# Patient Record
Sex: Female | Born: 1945 | Race: White | Hispanic: No | Marital: Married | Smoking: Never smoker
Health system: Southern US, Community
[De-identification: ages and names within clinical notes are randomized; demographics above are authoritative.]

## PROBLEM LIST (undated history)

## (undated) DIAGNOSIS — Z923 Personal history of irradiation: Secondary | ICD-10-CM

## (undated) DIAGNOSIS — T7840XA Allergy, unspecified, initial encounter: Secondary | ICD-10-CM

## (undated) DIAGNOSIS — N3010 Interstitial cystitis (chronic) without hematuria: Secondary | ICD-10-CM

## (undated) DIAGNOSIS — E039 Hypothyroidism, unspecified: Secondary | ICD-10-CM

## (undated) DIAGNOSIS — M810 Age-related osteoporosis without current pathological fracture: Secondary | ICD-10-CM

## (undated) DIAGNOSIS — M1990 Unspecified osteoarthritis, unspecified site: Secondary | ICD-10-CM

## (undated) DIAGNOSIS — C50919 Malignant neoplasm of unspecified site of unspecified female breast: Secondary | ICD-10-CM

## (undated) DIAGNOSIS — Z860100 Personal history of colon polyps, unspecified: Secondary | ICD-10-CM

## (undated) DIAGNOSIS — F329 Major depressive disorder, single episode, unspecified: Secondary | ICD-10-CM

## (undated) DIAGNOSIS — M8580 Other specified disorders of bone density and structure, unspecified site: Secondary | ICD-10-CM

## (undated) DIAGNOSIS — F419 Anxiety disorder, unspecified: Secondary | ICD-10-CM

## (undated) DIAGNOSIS — C4490 Unspecified malignant neoplasm of skin, unspecified: Secondary | ICD-10-CM

## (undated) DIAGNOSIS — H269 Unspecified cataract: Secondary | ICD-10-CM

## (undated) DIAGNOSIS — E559 Vitamin D deficiency, unspecified: Secondary | ICD-10-CM

## (undated) DIAGNOSIS — E785 Hyperlipidemia, unspecified: Secondary | ICD-10-CM

## (undated) DIAGNOSIS — F32A Depression, unspecified: Secondary | ICD-10-CM

## (undated) HISTORY — DX: Age-related osteoporosis without current pathological fracture: M81.0

## (undated) HISTORY — DX: Unspecified malignant neoplasm of skin, unspecified: C44.90

## (undated) HISTORY — DX: Unspecified osteoarthritis, unspecified site: M19.90

## (undated) HISTORY — PX: EYE SURGERY: SHX253

## (undated) HISTORY — DX: Personal history of colonic polyps: Z86.010

## (undated) HISTORY — DX: Vitamin D deficiency, unspecified: E55.9

## (undated) HISTORY — DX: Personal history of colon polyps, unspecified: Z86.0100

## (undated) HISTORY — DX: Allergy, unspecified, initial encounter: T78.40XA

## (undated) HISTORY — DX: Interstitial cystitis (chronic) without hematuria: N30.10

## (undated) HISTORY — DX: Other specified disorders of bone density and structure, unspecified site: M85.80

## (undated) HISTORY — DX: Unspecified cataract: H26.9

## (undated) HISTORY — PX: TONSILLECTOMY: SUR1361

## (undated) HISTORY — DX: Hyperlipidemia, unspecified: E78.5

---
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
## 1999-05-22 ENCOUNTER — Encounter: Payer: Self-pay | Admitting: Obstetrics and Gynecology

## 1999-05-22 ENCOUNTER — Encounter: Admission: RE | Admit: 1999-05-22 | Discharge: 1999-05-22 | Payer: Self-pay | Admitting: Obstetrics and Gynecology

## 1999-05-27 ENCOUNTER — Encounter: Payer: Self-pay | Admitting: Obstetrics and Gynecology

## 1999-05-27 ENCOUNTER — Encounter: Admission: RE | Admit: 1999-05-27 | Discharge: 1999-05-27 | Payer: Self-pay | Admitting: Obstetrics and Gynecology

## 1999-11-17 ENCOUNTER — Encounter: Payer: Self-pay | Admitting: Obstetrics and Gynecology

## 1999-11-17 ENCOUNTER — Encounter: Admission: RE | Admit: 1999-11-17 | Discharge: 1999-11-17 | Payer: Self-pay | Admitting: Obstetrics and Gynecology

## 2000-02-17 ENCOUNTER — Encounter: Admission: RE | Admit: 2000-02-17 | Discharge: 2000-02-17 | Payer: Self-pay | Admitting: Obstetrics and Gynecology

## 2000-02-17 ENCOUNTER — Encounter: Payer: Self-pay | Admitting: Obstetrics and Gynecology

## 2000-06-03 ENCOUNTER — Encounter: Admission: RE | Admit: 2000-06-03 | Discharge: 2000-06-03 | Payer: Self-pay | Admitting: Obstetrics and Gynecology

## 2000-06-03 ENCOUNTER — Encounter: Payer: Self-pay | Admitting: Obstetrics and Gynecology

## 2001-06-21 ENCOUNTER — Encounter: Payer: Self-pay | Admitting: Family Medicine

## 2001-06-21 ENCOUNTER — Encounter: Admission: RE | Admit: 2001-06-21 | Discharge: 2001-06-21 | Payer: Self-pay | Admitting: Family Medicine

## 2001-09-14 LAB — HM COLONOSCOPY

## 2001-10-11 ENCOUNTER — Ambulatory Visit (HOSPITAL_COMMUNITY): Admission: RE | Admit: 2001-10-11 | Discharge: 2001-10-11 | Payer: Self-pay | Admitting: Gastroenterology

## 2001-10-25 ENCOUNTER — Other Ambulatory Visit: Admission: RE | Admit: 2001-10-25 | Discharge: 2001-10-25 | Payer: Self-pay | Admitting: Radiology

## 2001-10-25 ENCOUNTER — Encounter: Admission: RE | Admit: 2001-10-25 | Discharge: 2001-10-25 | Payer: Self-pay | Admitting: Family Medicine

## 2001-10-25 ENCOUNTER — Encounter (INDEPENDENT_AMBULATORY_CARE_PROVIDER_SITE_OTHER): Payer: Self-pay | Admitting: *Deleted

## 2001-10-25 ENCOUNTER — Encounter: Payer: Self-pay | Admitting: Family Medicine

## 2001-11-29 ENCOUNTER — Other Ambulatory Visit: Admission: RE | Admit: 2001-11-29 | Discharge: 2001-11-29 | Payer: Self-pay | Admitting: Family Medicine

## 2002-08-01 ENCOUNTER — Encounter: Payer: Self-pay | Admitting: Family Medicine

## 2002-08-01 ENCOUNTER — Encounter: Admission: RE | Admit: 2002-08-01 | Discharge: 2002-08-01 | Payer: Self-pay | Admitting: Family Medicine

## 2002-11-23 DIAGNOSIS — D229 Melanocytic nevi, unspecified: Secondary | ICD-10-CM

## 2002-11-23 HISTORY — DX: Melanocytic nevi, unspecified: D22.9

## 2002-12-07 ENCOUNTER — Other Ambulatory Visit: Admission: RE | Admit: 2002-12-07 | Discharge: 2002-12-07 | Payer: Self-pay | Admitting: Family Medicine

## 2003-08-21 ENCOUNTER — Encounter: Admission: RE | Admit: 2003-08-21 | Discharge: 2003-08-21 | Payer: Self-pay | Admitting: Family Medicine

## 2004-01-03 ENCOUNTER — Other Ambulatory Visit: Admission: RE | Admit: 2004-01-03 | Discharge: 2004-01-03 | Payer: Self-pay | Admitting: Family Medicine

## 2004-09-08 ENCOUNTER — Encounter: Admission: RE | Admit: 2004-09-08 | Discharge: 2004-09-08 | Payer: Self-pay | Admitting: Family Medicine

## 2005-01-14 ENCOUNTER — Other Ambulatory Visit: Admission: RE | Admit: 2005-01-14 | Discharge: 2005-01-14 | Payer: Self-pay | Admitting: Family Medicine

## 2005-09-14 ENCOUNTER — Encounter: Admission: RE | Admit: 2005-09-14 | Discharge: 2005-09-14 | Payer: Self-pay | Admitting: Family Medicine

## 2006-01-20 ENCOUNTER — Other Ambulatory Visit: Admission: RE | Admit: 2006-01-20 | Discharge: 2006-01-20 | Payer: Self-pay | Admitting: Family Medicine

## 2006-05-04 ENCOUNTER — Ambulatory Visit (HOSPITAL_COMMUNITY): Admission: RE | Admit: 2006-05-04 | Discharge: 2006-05-04 | Payer: Self-pay | Admitting: Family Medicine

## 2006-09-29 ENCOUNTER — Encounter: Admission: RE | Admit: 2006-09-29 | Discharge: 2006-09-29 | Payer: Self-pay | Admitting: Family Medicine

## 2007-03-18 LAB — HM DEXA SCAN

## 2007-10-05 ENCOUNTER — Encounter: Admission: RE | Admit: 2007-10-05 | Discharge: 2007-10-05 | Payer: Self-pay | Admitting: Family Medicine

## 2008-05-20 ENCOUNTER — Encounter: Admission: RE | Admit: 2008-05-20 | Discharge: 2008-05-20 | Payer: Self-pay | Admitting: Family Medicine

## 2008-07-24 ENCOUNTER — Encounter: Admission: RE | Admit: 2008-07-24 | Discharge: 2008-07-24 | Payer: Self-pay | Admitting: Family Medicine

## 2008-10-09 ENCOUNTER — Encounter: Admission: RE | Admit: 2008-10-09 | Discharge: 2008-10-09 | Payer: Self-pay | Admitting: Family Medicine

## 2009-11-05 ENCOUNTER — Encounter: Admission: RE | Admit: 2009-11-05 | Discharge: 2009-11-05 | Payer: Self-pay | Admitting: Obstetrics and Gynecology

## 2009-11-07 ENCOUNTER — Encounter: Admission: RE | Admit: 2009-11-07 | Discharge: 2009-11-07 | Payer: Self-pay | Admitting: Obstetrics and Gynecology

## 2009-11-07 LAB — HM MAMMOGRAPHY

## 2010-03-24 LAB — FECAL OCCULT BLOOD, GUAIAC: Fecal Occult Blood: NEGATIVE

## 2010-06-29 LAB — HM PAP SMEAR

## 2010-09-02 ENCOUNTER — Encounter: Payer: Self-pay | Admitting: Family Medicine

## 2010-09-02 DIAGNOSIS — M810 Age-related osteoporosis without current pathological fracture: Secondary | ICD-10-CM | POA: Insufficient documentation

## 2010-10-02 NOTE — Procedures (Signed)
[facility]  Patient:    Kayla Randolph, Kayla Randolph Visit Number: [id number] MRN: [id number]          Service Type: END Location: ENDO Attending Physician:  Louie Bun Dictated by:   Everardo All Madilyn Fireman, M.D. Proc. Date: 10/11/01 Admit Date:  10/11/2001   CC:         Gweneth Dimitri, M.D.   Procedure Report  PROCEDURE:  Colonoscopy.  INDICATION FOR PROCEDURE:  Screening colonoscopy in a 65 year old patient with no previous colon screening.  DESCRIPTION OF PROCEDURE:  The patient was placed in the left lateral decubitus position then placed on the pulse monitor with continuous low flow oxygen delivered by nasal cannula. She was sedated with 80 mg IV Demerol and 8 mg IV Versed. The Olympus video colonoscope was inserted into the rectum and advanced to the cecum, confirmed by transillumination at McBurneys point and visualization of the ileocecal valve and appendiceal orifice. The prep was excellent. The cecum, ascending, transverse, descending and sigmoid colon all appeared normal with no masses, polyps, diverticula or other mucosal abnormalities. The rectum likewise appeared normal and retroflexed view of the anus revealed some small internal hemorrhoids. The colonoscope was then withdrawn and the patient returned to the recovery room in stable condition. The patient tolerated the procedure well and there were no immediate complications.  IMPRESSION:  Internal hemorrhoids otherwise normal colonoscopy.  PLAN:  Flexible sigmoidoscopy in five years. Colonoscopy in 10 years. Dictated by:   Everardo All Madilyn Fireman, M.D. Attending Physician:  Louie Bun DD:  10/11/01 TD:  10/12/01 Job: [phone]

## 2010-12-01 ENCOUNTER — Other Ambulatory Visit: Payer: Self-pay | Admitting: Family Medicine

## 2010-12-01 DIAGNOSIS — Z1231 Encounter for screening mammogram for malignant neoplasm of breast: Secondary | ICD-10-CM

## 2010-12-08 ENCOUNTER — Ambulatory Visit
Admission: RE | Admit: 2010-12-08 | Discharge: 2010-12-08 | Disposition: A | Discharge status: Home / Self Care | Payer: Medicare Other | Source: Ambulatory Visit | Attending: Family Medicine | Admitting: Family Medicine

## 2010-12-08 DIAGNOSIS — Z1231 Encounter for screening mammogram for malignant neoplasm of breast: Secondary | ICD-10-CM

## 2011-07-15 DIAGNOSIS — M542 Cervicalgia: Secondary | ICD-10-CM | POA: Diagnosis not present

## 2011-07-15 DIAGNOSIS — R109 Unspecified abdominal pain: Secondary | ICD-10-CM | POA: Diagnosis not present

## 2011-09-29 DIAGNOSIS — Z124 Encounter for screening for malignant neoplasm of cervix: Secondary | ICD-10-CM | POA: Diagnosis not present

## 2011-09-29 DIAGNOSIS — Z01419 Encounter for gynecological examination (general) (routine) without abnormal findings: Secondary | ICD-10-CM | POA: Diagnosis not present

## 2011-10-12 DIAGNOSIS — R5381 Other malaise: Secondary | ICD-10-CM | POA: Diagnosis not present

## 2011-10-12 DIAGNOSIS — E559 Vitamin D deficiency, unspecified: Secondary | ICD-10-CM | POA: Diagnosis not present

## 2011-10-12 DIAGNOSIS — E785 Hyperlipidemia, unspecified: Secondary | ICD-10-CM | POA: Diagnosis not present

## 2011-10-12 DIAGNOSIS — R5383 Other fatigue: Secondary | ICD-10-CM | POA: Diagnosis not present

## 2011-11-17 DIAGNOSIS — E785 Hyperlipidemia, unspecified: Secondary | ICD-10-CM | POA: Diagnosis not present

## 2011-11-30 DIAGNOSIS — D128 Benign neoplasm of rectum: Secondary | ICD-10-CM | POA: Diagnosis not present

## 2011-11-30 DIAGNOSIS — D129 Benign neoplasm of anus and anal canal: Secondary | ICD-10-CM | POA: Diagnosis not present

## 2011-11-30 DIAGNOSIS — D126 Benign neoplasm of colon, unspecified: Secondary | ICD-10-CM | POA: Diagnosis not present

## 2011-11-30 DIAGNOSIS — Z1211 Encounter for screening for malignant neoplasm of colon: Secondary | ICD-10-CM | POA: Diagnosis not present

## 2011-12-09 DIAGNOSIS — M899 Disorder of bone, unspecified: Secondary | ICD-10-CM | POA: Diagnosis not present

## 2011-12-09 DIAGNOSIS — E785 Hyperlipidemia, unspecified: Secondary | ICD-10-CM | POA: Diagnosis not present

## 2011-12-09 DIAGNOSIS — R072 Precordial pain: Secondary | ICD-10-CM | POA: Diagnosis not present

## 2011-12-09 DIAGNOSIS — R5381 Other malaise: Secondary | ICD-10-CM | POA: Diagnosis not present

## 2011-12-09 DIAGNOSIS — M949 Disorder of cartilage, unspecified: Secondary | ICD-10-CM | POA: Diagnosis not present

## 2011-12-09 DIAGNOSIS — R5383 Other fatigue: Secondary | ICD-10-CM | POA: Diagnosis not present

## 2011-12-15 ENCOUNTER — Other Ambulatory Visit: Payer: Self-pay | Admitting: Family Medicine

## 2011-12-15 DIAGNOSIS — Z1231 Encounter for screening mammogram for malignant neoplasm of breast: Secondary | ICD-10-CM

## 2011-12-28 ENCOUNTER — Ambulatory Visit
Admission: RE | Admit: 2011-12-28 | Discharge: 2011-12-28 | Disposition: A | Discharge status: Home / Self Care | Payer: Medicare Other | Source: Ambulatory Visit | Attending: Family Medicine | Admitting: Family Medicine

## 2011-12-28 DIAGNOSIS — Z1231 Encounter for screening mammogram for malignant neoplasm of breast: Secondary | ICD-10-CM

## 2012-02-03 DIAGNOSIS — H04129 Dry eye syndrome of unspecified lacrimal gland: Secondary | ICD-10-CM | POA: Diagnosis not present

## 2012-02-03 DIAGNOSIS — H2510 Age-related nuclear cataract, unspecified eye: Secondary | ICD-10-CM | POA: Diagnosis not present

## 2012-03-09 DIAGNOSIS — Z23 Encounter for immunization: Secondary | ICD-10-CM | POA: Diagnosis not present

## 2012-03-15 DIAGNOSIS — M899 Disorder of bone, unspecified: Secondary | ICD-10-CM | POA: Diagnosis not present

## 2012-06-01 DIAGNOSIS — K589 Irritable bowel syndrome without diarrhea: Secondary | ICD-10-CM | POA: Diagnosis not present

## 2012-06-01 DIAGNOSIS — M949 Disorder of cartilage, unspecified: Secondary | ICD-10-CM | POA: Diagnosis not present

## 2012-06-01 DIAGNOSIS — E559 Vitamin D deficiency, unspecified: Secondary | ICD-10-CM | POA: Diagnosis not present

## 2012-06-01 DIAGNOSIS — E785 Hyperlipidemia, unspecified: Secondary | ICD-10-CM | POA: Diagnosis not present

## 2012-06-01 DIAGNOSIS — M899 Disorder of bone, unspecified: Secondary | ICD-10-CM | POA: Diagnosis not present

## 2012-06-01 DIAGNOSIS — I10 Essential (primary) hypertension: Secondary | ICD-10-CM | POA: Diagnosis not present

## 2012-06-02 DIAGNOSIS — Z1212 Encounter for screening for malignant neoplasm of rectum: Secondary | ICD-10-CM | POA: Diagnosis not present

## 2012-06-22 DIAGNOSIS — D510 Vitamin B12 deficiency anemia due to intrinsic factor deficiency: Secondary | ICD-10-CM | POA: Diagnosis not present

## 2012-06-22 DIAGNOSIS — R5381 Other malaise: Secondary | ICD-10-CM | POA: Diagnosis not present

## 2012-06-22 DIAGNOSIS — Z79899 Other long term (current) drug therapy: Secondary | ICD-10-CM | POA: Diagnosis not present

## 2012-06-22 DIAGNOSIS — R5383 Other fatigue: Secondary | ICD-10-CM | POA: Diagnosis not present

## 2012-07-04 DIAGNOSIS — L821 Other seborrheic keratosis: Secondary | ICD-10-CM | POA: Diagnosis not present

## 2012-07-04 DIAGNOSIS — D239 Other benign neoplasm of skin, unspecified: Secondary | ICD-10-CM | POA: Diagnosis not present

## 2012-07-04 DIAGNOSIS — L608 Other nail disorders: Secondary | ICD-10-CM | POA: Diagnosis not present

## 2012-09-18 ENCOUNTER — Telehealth: Payer: Self-pay | Admitting: Family Medicine

## 2012-09-18 DIAGNOSIS — R790 Abnormal level of blood mineral: Secondary | ICD-10-CM

## 2012-09-19 ENCOUNTER — Other Ambulatory Visit (INDEPENDENT_AMBULATORY_CARE_PROVIDER_SITE_OTHER): Payer: Medicare Other

## 2012-09-19 DIAGNOSIS — R7989 Other specified abnormal findings of blood chemistry: Secondary | ICD-10-CM

## 2012-09-19 LAB — POCT CBC
Granulocyte percent: 67 %G (ref 37–80)
HCT, POC: 47.6 % (ref 37.7–47.9)
Hemoglobin: 15.1 g/dL (ref 12.2–16.2)
Lymph, poc: 1.4 (ref 0.6–3.4)
MCH, POC: 28.7 pg (ref 27–31.2)
MCHC: 31.7 g/dL — AB (ref 31.8–35.4)
MCV: 90.4 fL (ref 80–97)
MPV: 8.1 fL (ref 0–99.8)
POC Granulocyte: 3.3 (ref 2–6.9)
POC LYMPH PERCENT: 27.6 %L (ref 10–50)
Platelet Count, POC: 177 10*3/uL (ref 142–424)
RBC: 5.3 M/uL (ref 4.04–5.48)
RDW, POC: 14.6 %
WBC: 5 10*3/uL (ref 4.6–10.2)

## 2012-09-19 LAB — FERRITIN: Ferritin: 23 ng/mL (ref 10–291)

## 2012-09-19 NOTE — Progress Notes (Unsigned)
Patient came in for labs only.

## 2012-09-19 NOTE — Telephone Encounter (Signed)
Patient had a ferritin level of 12 in 06/2012 and was advised to repeat the CBC and ferritin in 3 months.   Appt scheduled for labwork today and orders placed.

## 2012-11-14 ENCOUNTER — Telehealth: Payer: Self-pay | Admitting: Family Medicine

## 2012-11-14 DIAGNOSIS — E559 Vitamin D deficiency, unspecified: Secondary | ICD-10-CM

## 2012-11-14 DIAGNOSIS — D649 Anemia, unspecified: Secondary | ICD-10-CM

## 2012-11-14 DIAGNOSIS — E785 Hyperlipidemia, unspecified: Secondary | ICD-10-CM

## 2012-11-14 DIAGNOSIS — R5383 Other fatigue: Secondary | ICD-10-CM

## 2012-11-14 NOTE — Telephone Encounter (Signed)
Pt notified lab order entered.  

## 2012-12-07 ENCOUNTER — Ambulatory Visit: Payer: Self-pay | Admitting: Family Medicine

## 2012-12-07 ENCOUNTER — Other Ambulatory Visit (INDEPENDENT_AMBULATORY_CARE_PROVIDER_SITE_OTHER): Payer: Medicare Other

## 2012-12-07 DIAGNOSIS — D649 Anemia, unspecified: Secondary | ICD-10-CM | POA: Diagnosis not present

## 2012-12-07 DIAGNOSIS — E785 Hyperlipidemia, unspecified: Secondary | ICD-10-CM

## 2012-12-07 DIAGNOSIS — R5383 Other fatigue: Secondary | ICD-10-CM

## 2012-12-07 DIAGNOSIS — R5381 Other malaise: Secondary | ICD-10-CM

## 2012-12-07 DIAGNOSIS — E559 Vitamin D deficiency, unspecified: Secondary | ICD-10-CM

## 2012-12-07 LAB — POCT CBC
Granulocyte percent: 74.3 %G (ref 37–80)
HCT, POC: 45.4 % (ref 37.7–47.9)
Hemoglobin: 14.7 g/dL (ref 12.2–16.2)
Lymph, poc: 1.1 (ref 0.6–3.4)
MCH, POC: 29.4 pg (ref 27–31.2)
MCHC: 32.4 g/dL (ref 31.8–35.4)
MCV: 90.7 fL (ref 80–97)
MPV: 8.2 fL (ref 0–99.8)
POC Granulocyte: 3.9 (ref 2–6.9)
POC LYMPH PERCENT: 21.3 %L (ref 10–50)
Platelet Count, POC: 154 10*3/uL (ref 142–424)
RBC: 5 M/uL (ref 4.04–5.48)
RDW, POC: 14.3 %
WBC: 5.2 10*3/uL (ref 4.6–10.2)

## 2012-12-08 LAB — THYROID PANEL WITH TSH
Free Thyroxine Index: 2 (ref 1.0–3.9)
T3 Uptake: 37.8 % — ABNORMAL HIGH (ref 22.5–37.0)
T4, Total: 5.3 ug/dL (ref 5.0–12.5)
TSH: 2.299 u[IU]/mL (ref 0.350–4.500)

## 2012-12-08 LAB — BASIC METABOLIC PANEL WITH GFR
BUN: 15 mg/dL (ref 6–23)
CO2: 29 mEq/L (ref 19–32)
Calcium: 9.3 mg/dL (ref 8.4–10.5)
Chloride: 103 mEq/L (ref 96–112)
Creat: 0.8 mg/dL (ref 0.50–1.10)
GFR, Est African American: 88 mL/min
GFR, Est Non African American: 76 mL/min
Glucose, Bld: 72 mg/dL (ref 70–99)
Potassium: 4.4 mEq/L (ref 3.5–5.3)
Sodium: 143 mEq/L (ref 135–145)

## 2012-12-08 LAB — HEPATIC FUNCTION PANEL
ALT: 14 U/L (ref 0–35)
AST: 23 U/L (ref 0–37)
Albumin: 4.2 g/dL (ref 3.5–5.2)
Alkaline Phosphatase: 38 U/L — ABNORMAL LOW (ref 39–117)
Bilirubin, Direct: 0.1 mg/dL (ref 0.0–0.3)
Indirect Bilirubin: 0.5 mg/dL (ref 0.0–0.9)
Total Bilirubin: 0.6 mg/dL (ref 0.3–1.2)
Total Protein: 6.4 g/dL (ref 6.0–8.3)

## 2012-12-08 LAB — VITAMIN D 25 HYDROXY (VIT D DEFICIENCY, FRACTURES): Vit D, 25-Hydroxy: 58 ng/mL (ref 30–89)

## 2012-12-11 LAB — NMR LIPOPROFILE WITH LIPIDS
Cholesterol, Total: 195 mg/dL (ref <200)
HDL Particle Number: 39.5 umol/L (ref >=30.5)
HDL Size: 9.8 nm (ref >=9.2)
HDL-C: 76 mg/dL (ref >=40)
LDL (calc): 102 mg/dL — ABNORMAL HIGH (ref <100)
LDL Particle Number: 1344 nmol/L — ABNORMAL HIGH (ref <1000)
LDL Size: 21.5 nm (ref >20.5)
LP-IR Score: <25 (ref <=45)
Large HDL-P: 15.5 umol/L (ref >=4.8)
Large VLDL-P: <0.8 nmol/L (ref <=2.7)
Small LDL Particle Number: 397 nmol/L (ref <=527)
Triglycerides: 83 mg/dL (ref <150)
VLDL Size: 43.7 nm (ref <=46.6)

## 2012-12-14 ENCOUNTER — Encounter: Payer: Self-pay | Admitting: Family Medicine

## 2012-12-14 ENCOUNTER — Ambulatory Visit (INDEPENDENT_AMBULATORY_CARE_PROVIDER_SITE_OTHER): Payer: Medicare Other | Admitting: Family Medicine

## 2012-12-14 VITALS — BP 107/54 | HR 62 | Temp 97.3°F | Ht 66.0 in | Wt 117.2 lb

## 2012-12-14 DIAGNOSIS — E559 Vitamin D deficiency, unspecified: Secondary | ICD-10-CM | POA: Diagnosis not present

## 2012-12-14 DIAGNOSIS — E785 Hyperlipidemia, unspecified: Secondary | ICD-10-CM

## 2012-12-14 DIAGNOSIS — G5690 Unspecified mononeuropathy of unspecified upper limb: Secondary | ICD-10-CM

## 2012-12-14 DIAGNOSIS — J309 Allergic rhinitis, unspecified: Secondary | ICD-10-CM | POA: Diagnosis not present

## 2012-12-14 DIAGNOSIS — M47812 Spondylosis without myelopathy or radiculopathy, cervical region: Secondary | ICD-10-CM

## 2012-12-14 DIAGNOSIS — M810 Age-related osteoporosis without current pathological fracture: Secondary | ICD-10-CM

## 2012-12-14 NOTE — Patient Instructions (Addendum)
Fall precautions discussed Continue current meds and therapeutic lifestyle changes Pt will schedule mammogram  Using ayr nose spray frequently Drink plenty of fluid

## 2012-12-14 NOTE — Progress Notes (Signed)
  Subjective:    Patient ID: Kayla Randolph, female    DOB: [birth year], 67 y.o.   MRN: [id number]  HPI Patient comes in today for followup of chronic medical problems. These include hyperlipidemia, vitamin D deficiency, osteopenia, allergic rhinitis, and IBS. She also has a history of a cervical neuropathy. Her health maintenance parameters are currently up-to-date. Her mammogram will be due after August 18.   Review of Systems  Constitutional: Positive for fatigue.  HENT: Positive for postnasal drip. Negative for ear pain and sore throat.   Eyes: Positive for pain (burning, dry eyes).  Respiratory: Positive for cough (due to drainage, dry). Negative for choking, chest tightness, shortness of breath and wheezing.   Cardiovascular: Negative.  Negative for chest pain, palpitations and leg swelling.  Gastrointestinal: Positive for abdominal pain and abdominal distention (bloating due to IBS).  Endocrine: Positive for cold intolerance. Negative for heat intolerance, polydipsia, polyphagia and polyuria.  Genitourinary: Negative for dysuria, urgency, frequency, hematuria, vaginal bleeding, vaginal discharge and vaginal pain.  Musculoskeletal: Positive for back pain (LBP) and arthralgias (R elbow occasionally).  Skin: Negative.  Negative for color change, pallor, rash and wound.  Allergic/Immunologic: Positive for environmental allergies (year around).  Neurological: Positive for numbness (postional, R>L arms) and headaches. Negative for dizziness, tremors, weakness and light-headedness.  Hematological: Negative.   Psychiatric/Behavioral: Negative for sleep disturbance. The patient is nervous/anxious (intermitent anxiety).        Objective:   Physical Exam BP 107/54  Pulse 62  Temp(Src) 97.3 F (36.3 C) (Oral)  Ht 5\' 6"  (1.676 m)  Wt 117 lb 3.2 oz (53.162 kg)  BMI 18.93 kg/m2  The patient appeared well nourished and normally developed, alert and oriented to time and place. Speech, behavior  and judgement appear normal. Vital signs as documented.  Head exam is unremarkable. No scleral icterus or pallor noted. She had nasal congestion bilaterally. Her throat was slightly red posteriorly. Ear canals and eardrums were normal. Neck is without jugular venous distension, thyromegally, or carotid bruits. Carotid upstrokes are brisk bilaterally. No cervical adenopathy. Lungs are clear anteriorly and posteriorly to auscultation. Normal respiratory effort. Cardiac exam reveals regular rate and rhythm at 60 per minute.. First and second heart sounds normal.  No murmurs, rubs or gallops. Breasts exam was done today. There were is thickening and the upper outer quadrants of each breast consistent with fibrocystic changes. She is due to get her mammogram in late August or September  Abdominal exam reveals normal bowl sounds, no masses, no organomegaly and no aortic enlargement. No inguinal adenopathy. No specific tenderness. Extremities are nonedematous and both femoral and pedal pulses are normal. Lower extremities were cool to touch. Skin without pallor or jaundice.  Warm and dry, without rash. Neurologic exam reveals normal deep tendon reflexes and normal sensation. Reflexes were checked in both upper and lower extremities.          Assessment & Plan:  1. Hyperlipemia  2. Vitamin D deficiency  3. Allergic rhinitis  4. Cervical spine arthritis  5. Upper extremity neuropathy, unspecified laterality  6. Osteopenia  Patient Instructions  Fall precautions discussed Continue current meds and therapeutic lifestyle changes Pt will schedule mammogram  Using ayr nose spray frequently Drink plenty of fluid     Nyra Capes MD

## 2012-12-18 ENCOUNTER — Other Ambulatory Visit: Payer: Self-pay

## 2012-12-18 DIAGNOSIS — Z1231 Encounter for screening mammogram for malignant neoplasm of breast: Secondary | ICD-10-CM

## 2013-01-02 ENCOUNTER — Ambulatory Visit
Admission: RE | Admit: 2013-01-02 | Discharge: 2013-01-02 | Disposition: A | Discharge status: Home / Self Care | Payer: Medicare Other | Source: Ambulatory Visit

## 2013-01-02 DIAGNOSIS — Z1231 Encounter for screening mammogram for malignant neoplasm of breast: Secondary | ICD-10-CM | POA: Diagnosis not present

## 2013-01-04 ENCOUNTER — Ambulatory Visit (INDEPENDENT_AMBULATORY_CARE_PROVIDER_SITE_OTHER): Payer: Medicare Other | Admitting: Pharmacist

## 2013-01-04 ENCOUNTER — Encounter: Payer: Self-pay | Admitting: Pharmacist

## 2013-01-04 ENCOUNTER — Telehealth: Payer: Self-pay | Admitting: Family Medicine

## 2013-01-04 VITALS — Ht 66.0 in | Wt 118.0 lb

## 2013-01-04 DIAGNOSIS — E785 Hyperlipidemia, unspecified: Secondary | ICD-10-CM | POA: Diagnosis not present

## 2013-01-04 NOTE — Telephone Encounter (Signed)
Husband had questions about gluten free diet.  He wanted to know if he needs to take a supplement.  Suggested daily MVI.
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
## 2013-02-02 ENCOUNTER — Other Ambulatory Visit: Payer: Self-pay | Admitting: Family Medicine

## 2013-03-12 ENCOUNTER — Other Ambulatory Visit: Payer: Self-pay | Admitting: Family Medicine

## 2013-03-22 ENCOUNTER — Other Ambulatory Visit: Payer: Self-pay

## 2013-03-22 DIAGNOSIS — H04129 Dry eye syndrome of unspecified lacrimal gland: Secondary | ICD-10-CM | POA: Diagnosis not present

## 2013-03-22 DIAGNOSIS — H2510 Age-related nuclear cataract, unspecified eye: Secondary | ICD-10-CM | POA: Diagnosis not present

## 2013-04-03 ENCOUNTER — Ambulatory Visit (INDEPENDENT_AMBULATORY_CARE_PROVIDER_SITE_OTHER): Payer: Medicare Other | Admitting: Family Medicine

## 2013-04-03 ENCOUNTER — Encounter: Payer: Self-pay | Admitting: *Deleted

## 2013-04-03 ENCOUNTER — Encounter: Payer: Self-pay | Admitting: Family Medicine

## 2013-04-03 VITALS — BP 129/74 | HR 60 | Temp 97.7°F | Ht 66.0 in | Wt 116.0 lb

## 2013-04-03 DIAGNOSIS — E559 Vitamin D deficiency, unspecified: Secondary | ICD-10-CM

## 2013-04-03 DIAGNOSIS — M47812 Spondylosis without myelopathy or radiculopathy, cervical region: Secondary | ICD-10-CM

## 2013-04-03 DIAGNOSIS — M810 Age-related osteoporosis without current pathological fracture: Secondary | ICD-10-CM | POA: Diagnosis not present

## 2013-04-03 DIAGNOSIS — F32A Depression, unspecified: Secondary | ICD-10-CM | POA: Insufficient documentation

## 2013-04-03 DIAGNOSIS — E785 Hyperlipidemia, unspecified: Secondary | ICD-10-CM

## 2013-04-03 DIAGNOSIS — Z23 Encounter for immunization: Secondary | ICD-10-CM | POA: Diagnosis not present

## 2013-04-03 DIAGNOSIS — F329 Major depressive disorder, single episode, unspecified: Secondary | ICD-10-CM | POA: Diagnosis not present

## 2013-04-03 LAB — POCT CBC
Granulocyte percent: 75.4 %G (ref 37–80)
HCT, POC: 45.2 % (ref 37.7–47.9)
Hemoglobin: 15.1 g/dL (ref 12.2–16.2)
Lymph, poc: 1.1 (ref 0.6–3.4)
MCH, POC: 30.2 pg (ref 27–31.2)
MCHC: 33.3 g/dL (ref 31.8–35.4)
MCV: 90.6 fL (ref 80–97)
MPV: 8.4 fL (ref 0–99.8)
POC Granulocyte: 3.8 (ref 2–6.9)
POC LYMPH PERCENT: 21.5 %L (ref 10–50)
Platelet Count, POC: 174 10*3/uL (ref 142–424)
RBC: 5 M/uL (ref 4.04–5.48)
RDW, POC: 13.5 %
WBC: 5.1 10*3/uL (ref 4.6–10.2)

## 2013-04-03 MED ORDER — ESCITALOPRAM OXALATE 10 MG PO TABS: ORAL_TABLET | ORAL | Status: DC

## 2013-04-03 NOTE — Patient Instructions (Addendum)
Continue current medications. Continue good therapeutic lifestyle changes which include good diet and exercise. Fall precautions discussed with patient. Schedule your flu vaccine if you haven't had it yet If you are over 68 years old - you may need Prevnar 13 or the adult Pneumonia vaccine. Use saline nose sprays and gels Use your  coolmist humidifier in the bedroom at nighttime

## 2013-04-03 NOTE — Progress Notes (Signed)
Subjective:    Patient ID: Kayla Randolph, female    DOB: Apr 19, 1946, 67 y.o.   MRN: [id number]  HPI Pt here for follow up and management of chronic medical problems. Patient does complain of some bloating and gas after eating.        Patient Active Problem List   Diagnosis Date Noted  . Hyperlipemia 12/14/2012  . Vitamin D deficiency 12/14/2012  . Allergic rhinitis 12/14/2012  . 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 Osteopenia    Outpatient Encounter Prescriptions as of 04/03/2013  Medication Sig  . aspirin 81 MG tablet Take 81 mg by mouth daily.    . Calcium Carb-Cholecalciferol (CALCIUM 1000 + D PO) Take 1 tablet by mouth daily.    . Cholecalciferol (VITAMIN D3) 1000 UNITS CAPS Take 1 capsule by mouth daily.    Marland Kitchen conjugated estrogens (PREMARIN) vaginal cream Place 42.5 g vaginally daily.    . cycloSPORINE (RESTASIS) 0.05 % ophthalmic emulsion Place 1 drop into both eyes 2 (two) times daily.  Marland Kitchen escitalopram (LEXAPRO) 10 MG tablet TAKE (1) TABLET DAILY AS DIRECTED.  . fluticasone (FLONASE) 50 MCG/ACT nasal spray 2 sprays by Nasal route daily.    . Polyethylene Glycol 3350 (MIRALAX PO) Take 1 packet by mouth daily as needed.      Review of Systems  Constitutional: Negative.   HENT: Negative.   Eyes: Negative.   Respiratory: Negative.   Cardiovascular: Negative.   Gastrointestinal: Negative.        IBS- bloating after eating  Endocrine: Negative.   Genitourinary: Negative.   Musculoskeletal: Negative.   Skin: Negative.   Allergic/Immunologic: Negative.   Neurological: Negative.   Hematological: Negative.   Psychiatric/Behavioral: Negative.        Objective:   Physical Exam  Nursing note and vitals reviewed. Constitutional: She is oriented to person, place, and time. She appears well-developed and well-nourished. No distress.  HENT:  Head: Normocephalic and atraumatic.  Right Ear: External ear normal.  Left Ear: External  ear normal.  Mouth/Throat: Oropharynx is clear and moist.  Nasal turbinate dryness bilaterally  Eyes: Conjunctivae and EOM are normal. Pupils are equal, round, and reactive to light. Right eye exhibits no discharge. Left eye exhibits no discharge. No scleral icterus.  Slight conjunctival redness laterally with no discharge  Neck: Normal range of motion. Neck supple. No JVD present. No thyromegaly present.  No carotid bruits  Cardiovascular: Normal rate, regular rhythm, normal heart sounds and intact distal pulses.  Exam reveals no gallop and no friction rub.   No murmur heard. At 60 per minute  Pulmonary/Chest: Effort normal and breath sounds normal. No respiratory distress. She has no wheezes. She has no rales. She exhibits no tenderness.  Abdominal: Soft. Bowel sounds are normal. She exhibits no mass. There is no tenderness. There is no rebound and no guarding.  Musculoskeletal: Normal range of motion. She exhibits no edema and no tenderness.  Lymphadenopathy:    She has no cervical adenopathy.  Neurological: She is alert and oriented to person, place, and time. She has normal reflexes. No cranial nerve deficit.  Skin: Skin is warm and dry.  Psychiatric: She has a normal mood and affect. Her behavior is normal. Judgment and thought content normal.   BP 129/74  Pulse 60  Temp(Src) 97.7 F (36.5 C) (Oral)  Ht 5\' 6"  (1.676 m)  Wt 116 lb (52.617 kg)  BMI 18.73 kg/m2        Assessment &  Plan:   1. Hyperlipemia   2. Osteopenia   3. Vitamin D deficiency   4. Depression   5. Cervical spine arthritis    Orders Placed This Encounter  Procedures  . Hepatic function panel  . BMP8+EGFR  . NMR, lipoprofile  . Vit D  25 hydroxy (rtn osteoporosis monitoring)  . POCT CBC   Meds ordered this encounter  Medications  . escitalopram (LEXAPRO) 10 MG tablet    Sig: TAKE (1) TABLET DAILY AS DIRECTED.    Dispense:  90 tablet    Refill:  2   Patient Instructions  Continue current  medications. Continue good therapeutic lifestyle changes which include good diet and exercise. Fall precautions discussed with patient. Schedule your flu vaccine if you haven't had it yet If you are over 54 years old - you may need Prevnar 13 or the adult Pneumonia vaccine. Use saline nose sprays and gels Use your  coolmist humidifier in the bedroom at nighttime   Nyra Capes MD

## 2013-04-03 NOTE — Progress Notes (Signed)
Quick Note:  Copy of labs sent to patient ______ 

## 2013-04-05 LAB — NMR, LIPOPROFILE
Cholesterol: 202 mg/dL — ABNORMAL HIGH (ref <200)
HDL Cholesterol by NMR: 80 mg/dL (ref >=40)
HDL Particle Number: 41.7 umol/L (ref >=30.5)
LDL Particle Number: 1219 nmol/L — ABNORMAL HIGH (ref <1000)
LDL Size: 21.5 nm (ref >20.5)
LDLC SERPL CALC-MCNC: 101 mg/dL — ABNORMAL HIGH (ref <100)
LP-IR Score: 27 (ref <=45)
Small LDL Particle Number: 102 nmol/L (ref <=527)
Triglycerides by NMR: 107 mg/dL (ref <150)

## 2013-04-05 LAB — HEPATIC FUNCTION PANEL
ALT: 12 IU/L (ref 0–32)
AST: 20 IU/L (ref 0–40)
Albumin: 4.5 g/dL (ref 3.6–4.8)
Alkaline Phosphatase: 50 IU/L (ref 39–117)
Bilirubin, Direct: 0.11 mg/dL (ref 0.00–0.40)
Total Bilirubin: 0.4 mg/dL (ref 0.0–1.2)
Total Protein: 6.6 g/dL (ref 6.0–8.5)

## 2013-04-05 LAB — VITAMIN D 25 HYDROXY (VIT D DEFICIENCY, FRACTURES): Vit D, 25-Hydroxy: 44.1 ng/mL (ref 30.0–100.0)

## 2013-04-05 LAB — BMP8+EGFR
BUN/Creatinine Ratio: 19 (ref 11–26)
BUN: 13 mg/dL (ref 8–27)
CO2: 31 mmol/L — ABNORMAL HIGH (ref 18–29)
Calcium: 9.2 mg/dL (ref 8.6–10.2)
Chloride: 100 mmol/L (ref 97–108)
Creatinine, Ser: 0.7 mg/dL (ref 0.57–1.00)
GFR calc Af Amer: 103 mL/min/{1.73_m2} (ref >59)
GFR calc non Af Amer: 89 mL/min/{1.73_m2} (ref >59)
Glucose: 79 mg/dL (ref 65–99)
Potassium: 4.4 mmol/L (ref 3.5–5.2)
Sodium: 142 mmol/L (ref 134–144)

## 2013-04-28 ENCOUNTER — Ambulatory Visit (INDEPENDENT_AMBULATORY_CARE_PROVIDER_SITE_OTHER): Payer: Medicare Other | Admitting: Family Medicine

## 2013-04-28 ENCOUNTER — Encounter: Payer: Self-pay | Admitting: Family Medicine

## 2013-04-28 VITALS — BP 122/69 | HR 82 | Temp 97.0°F | Ht 66.0 in | Wt 115.0 lb

## 2013-04-28 DIAGNOSIS — J029 Acute pharyngitis, unspecified: Secondary | ICD-10-CM

## 2013-04-28 DIAGNOSIS — J09X2 Influenza due to identified novel influenza A virus with other respiratory manifestations: Secondary | ICD-10-CM

## 2013-04-28 DIAGNOSIS — R509 Fever, unspecified: Secondary | ICD-10-CM

## 2013-04-28 LAB — POCT RAPID STREP A (OFFICE): Rapid Strep A Screen: NEGATIVE

## 2013-04-28 LAB — POCT INFLUENZA A/B
Influenza A, POC: POSITIVE
Influenza B, POC: NEGATIVE

## 2013-04-28 MED ORDER — OSELTAMIVIR PHOSPHATE 75 MG PO CAPS: 75.0000 mg | ORAL_CAPSULE | Freq: Two times a day (BID) | ORAL | Status: DC

## 2013-04-28 MED ORDER — HYDROCODONE-HOMATROPINE 5-1.5 MG/5ML PO SYRP: 5.0000 mL | ORAL_SOLUTION | Freq: Three times a day (TID) | ORAL | Status: DC | PRN

## 2013-04-28 MED ORDER — AZITHROMYCIN 250 MG PO TABS: ORAL_TABLET | ORAL | Status: DC

## 2013-04-28 NOTE — Progress Notes (Signed)
   Subjective:    Patient ID: Kayla Randolph, female    DOB: 21-Mar-1946, 67 y.o.   MRN: [id number]  HPI  This 67 y.o. female presents for evaluation of nasal congestion, cough, and body aches.  Review of Systems No chest pain, SOB, HA, dizziness, vision change, N/V, diarrhea, constipation, dysuria, urinary urgency or frequency, myalgias, arthralgias or rash.     Objective:   Physical Exam  Vital signs noted  Well developed well nourished female.  HEENT - Head atraumatic Normocephalic                Eyes - PERRLA, Conjuctiva - clear Sclera- Clear EOMI                Ears - EAC's Wnl TM's Wnl Gross Hearing WNL                Nose - Nares patent                 Throat - oropharanx wnl Respiratory - Lungs CTA bilateral Cardiac - RRR S1 and S2 without murmur GI - Abdomen soft Nontender and bowel sounds active x 4   Results for orders placed in visit on 04/28/13  POCT INFLUENZA A/B      Result Value Range   Influenza A, POC Positive     Influenza B, POC Negative    POCT RAPID STREP A (OFFICE)      Result Value Range   Rapid Strep A Screen Negative  Negative      Assessment & Plan:  Fever, unspecified - Plan: POCT Influenza A/B, POCT rapid strep A, azithromycin (ZITHROMAX) 250 MG tablet, oseltamivir (TAMIFLU) 75 MG capsule, HYDROcodone-homatropine (HYCODAN) 5-1.5 MG/5ML syrup  Sore throat - Plan: POCT Influenza A/B, POCT rapid strep A, azithromycin (ZITHROMAX) 250 MG tablet, oseltamivir (TAMIFLU) 75 MG capsule, HYDROcodone-homatropine (HYCODAN) 5-1.5 MG/5ML syrup  Influenza due to identified novel influenza A virus with other respiratory manifestations - Plan: azithromycin (ZITHROMAX) 250 MG tablet, oseltamivir (TAMIFLU) 75 MG capsule, HYDROcodone-homatropine (HYCODAN) 5-1.5 MG/5ML syrup  Push po fluids, rest, tylenol and motrin otc prn as directed for fever, arthralgias, and myalgias.  Follow up prn if sx's continue or persist.  Deatra Canter FNP

## 2013-05-15 ENCOUNTER — Other Ambulatory Visit: Payer: Self-pay | Admitting: Family Medicine

## 2013-05-15 ENCOUNTER — Ambulatory Visit (INDEPENDENT_AMBULATORY_CARE_PROVIDER_SITE_OTHER): Payer: Medicare Other | Admitting: *Deleted

## 2013-05-15 DIAGNOSIS — Z23 Encounter for immunization: Secondary | ICD-10-CM

## 2013-05-15 NOTE — Patient Instructions (Signed)
Pneumococcal Conjugate Vaccine  What You Need to Know  Your doctor recommends that you, or your child, get a dose of PCV13 vaccine today.  WHY GET VACCINATED?  Pneumococcal conjugate vaccine (called PCV13 or Prevnar 13) is recommended to protect infants and toddlers, and some older children and adults with certain health conditions, from pneumococcal disease.  Pneumococcal disease is caused by infection with Streptococcus pneumoniae bacteria. These bacteria can spread from person to person through close contact.  Pneumococcal disease can lead to severe health problems, including pneumonia, blood infections, and meningitis.  Meningitis is an infection of the covering of the brain. Pneumococcal meningitis is fairly rare (less than 1 case per 100,000 people each year), but it leads to other health problems, including deafness and brain damage. In children, it is fatal in about 1 case out of 10.  Children younger than two are at higher risk for serious disease than older children.  People with certain medical conditions, people over age 65, and cigarette smokers are also at higher risk.  Before vaccine, pneumococcal infections caused many problems each year in the [place] in children younger than 5, including:  · more than 700 cases of meningitis,  · 13,000 blood infections,  · about 5 million ear infections, and  · about 200 deaths.  About 4,000 adults still die each year because of pneumococcal infections.  Pneumococcal infections can be hard to treat because some strains are resistant to antibiotics. This makes prevention through vaccination even more important.  PCV13 VACCINE  There are more than 90 types of pneumococcal bacteria. PCV13 protects against 13 of them. These 13 strains cause most severe infections in children and about half of infections in adults.   PCV13 is routinely given to children at 2, 4, 6, and 12 15 months of age. Children in this age range are at greatest risk for serious diseases caused  by pneumococcal infection.  PCV13 vaccine may also be recommended for some older children or adults. Your doctor can give you details.  A second type of pneumococcal vaccine, called PPSV23, may also be given to some children and adults, including anyone over age 65. There is a separate Vaccine Information Statement for this vaccine.  PRECAUTIONS   Anyone who has ever had a life-threatening allergic reaction to a dose of this vaccine, to an earlier pneumococcal vaccine called PCV7 (or Prevnar), or to any vaccine containing diphtheria toxoid (for example, DTaP), should not get PCV13.  Anyone with a severe allergy to any component of PCV13 should not get the vaccine. Tell your doctor if the person being vaccinated has any severe allergies.  If the person scheduled for vaccination is sick, your doctor might decide to reschedule the shot on another day.  Your doctor can give you more information about any of these precautions.  RISKS   With any medicine, including vaccines, there is a chance of side effects. These are usually mild and go away on their own, but serious reactions are also possible.  Reported problems associated with PCV13 vary by dose and age, but generally:  · About half of children became drowsy after the shot, had a temporary loss of appetite, or had redness or tenderness where the shot was given.  · About 1 out of 3 had swelling where the shot was given.  · About 1 out of 3 had a mild fever, and about 1 in 20 had a higher fever (over 102.2° F or 39° C).  · Up to about   8 out of 10 became fussy or irritable.  Adults receiving the vaccine have reported redness, pain, and swelling where the shot was given. Mild fever, fatigue, headache, chills, or muscle pain have also been reported.  Life-threatening allergic reactions from any vaccine are very rare.  WHAT IF THERE IS A SERIOUS REACTION?  What should I look for?  Look for anything that concerns you, such as signs of a severe allergic reaction, very high  fever, or behavior changes.  Signs of a severe allergic reaction can include hives, swelling of the face and throat, difficulty breathing, a fast heartbeat, dizziness, and weakness. These would start a few minutes to a few hours after the vaccination.  What should I do?  · If you think it is a severe allergic reaction or other emergency that can't wait, get the person to the nearest hospital or call 9-1-1. Otherwise, call your doctor.  · Afterward, the reaction should be reported to the "Vaccine Adverse Event Reporting System" (VAERS). Your doctor might file this report, or you can do it yourself through the VAERS web site at www.vaers.hhs.gov, or by calling 1-800-822-7967.  VAERS is only for reporting reactions. They do not give medical advice.  THE NATIONAL VACCINE INJURY COMPENSATION PROGRAM  The National Vaccine Injury Compensation Program (VICP) was created in 1986.  Persons who believe they may have been injured by a vaccine can learn about the program and about filing a claim by calling 1-800-338-2382 or visiting the VICP website at www.hrsa.gov/vaccinecompensation.  HOW CAN I LEARN MORE?  · Ask your doctor.  · Call your local or state health department.  · Contact the Centers for Disease Control and Prevention (CDC):  · Call 1-800-232-4636 (1-800-CDC-INFO) or  · Visit CDC's website at www.cdc.gov/vaccines  CDC PCV13 Vaccine VIS (Interim) (07/14/11)  Document Released: 02/28/2006 Document Revised: 08/28/2012 Document Reviewed: 08/23/2012  ExitCare® Patient Information ©2014 [employer].

## 2013-05-15 NOTE — Progress Notes (Signed)
prevnar given and tolerated well 

## 2013-05-16 ENCOUNTER — Other Ambulatory Visit: Payer: Self-pay | Admitting: Family Medicine

## 2013-07-06 ENCOUNTER — Ambulatory Visit (INDEPENDENT_AMBULATORY_CARE_PROVIDER_SITE_OTHER): Payer: Medicare Other | Admitting: Nurse Practitioner

## 2013-07-06 ENCOUNTER — Encounter: Payer: Self-pay | Admitting: Nurse Practitioner

## 2013-07-06 VITALS — BP 158/71 | HR 75 | Temp 97.8°F | Ht 66.0 in | Wt 116.0 lb

## 2013-07-06 DIAGNOSIS — N390 Urinary tract infection, site not specified: Secondary | ICD-10-CM

## 2013-07-06 DIAGNOSIS — R300 Dysuria: Secondary | ICD-10-CM

## 2013-07-06 LAB — POCT UA - MICROSCOPIC ONLY
Bacteria, U Microscopic: NEGATIVE
Casts, Ur, LPF, POC: NEGATIVE
Crystals, Ur, HPF, POC: NEGATIVE
Mucus, UA: NEGATIVE
Yeast, UA: NEGATIVE

## 2013-07-06 LAB — POCT URINALYSIS DIPSTICK
Bilirubin, UA: NEGATIVE
Glucose, UA: NEGATIVE
Ketones, UA: NEGATIVE
Nitrite, UA: NEGATIVE
Protein, UA: NEGATIVE
Spec Grav, UA: 1.01
Urobilinogen, UA: NEGATIVE
pH, UA: 7.5

## 2013-07-06 MED ORDER — CIPROFLOXACIN HCL 500 MG PO TABS: 500.0000 mg | ORAL_TABLET | Freq: Two times a day (BID) | ORAL | Status: DC

## 2013-07-06 MED ORDER — ESTRADIOL 0.1 MG/GM VA CREA: 1.0000 | TOPICAL_CREAM | Freq: Every day | VAGINAL | Status: DC

## 2013-07-06 NOTE — Progress Notes (Signed)
   Subjective:    Patient ID: Kayla Randolph, female    DOB: 06-29-1945, 68 y.o.   MRN: [id number]  HPI  Patient in today c/o urinary frequency and urgency- she has a history of interstitial  Cystitis- wants to make sure she doesn't have a uti.    Review of Systems  Constitutional: Negative for fever.  Respiratory: Negative.   Cardiovascular: Negative.   Genitourinary: Positive for dysuria, urgency, frequency and difficulty urinating.  All other systems reviewed and are negative.       Objective:   Physical Exam  Constitutional: She appears well-developed and well-nourished.  Cardiovascular: Normal rate, regular rhythm and normal heart sounds.   Pulmonary/Chest: Effort normal and breath sounds normal.  Abdominal: Soft. Bowel sounds are normal. There is tenderness (slight suprapubic).    BP 158/71  Pulse 75  Temp(Src) 97.8 F (36.6 C) (Oral)  Ht 5\' 6"  (1.676 m)  Wt 116 lb (52.617 kg)  BMI 18.73 kg/m2 Results for orders placed in visit on 07/06/13  POCT UA - MICROSCOPIC ONLY      Result Value Ref Range   WBC, Ur, HPF, POC 15-20     RBC, urine, microscopic 5-10     Bacteria, U Microscopic neg     Mucus, UA neg     Epithelial cells, urine per micros occ     Crystals, Ur, HPF, POC neg     Casts, Ur, LPF, POC neg     Yeast, UA neg    POCT URINALYSIS DIPSTICK      Result Value Ref Range   Color, UA yellow     Clarity, UA cloudy     Glucose, UA neg     Bilirubin, UA neg     Ketones, UA neg     Spec Grav, UA 1.010     Blood, UA mod     pH, UA 7.5     Protein, UA neg     Urobilinogen, UA negative     Nitrite, UA neg     Leukocytes, UA moderate (2+)           Assessment & Plan:   1. Dysuria   2. UTI (urinary tract infection)    Meds ordered this encounter  Medications  . ciprofloxacin (CIPRO) 500 MG tablet    Sig: Take 1 tablet (500 mg total) by mouth 2 (two) times daily.    Dispense:  14 tablet    Refill:  0    Order Specific Question:  Supervising  Provider    Answer:  Joycelyn Man   Force fluids AZO over the counter X2 days RTO prn Culture pending Mary-Margaret Hassell Done, FNP

## 2013-07-06 NOTE — Patient Instructions (Signed)
Asymptomatic Bacteriuria, Female Asymptomatic bacteriuria is a significant number of bacteria in your urine that occur without the usual symptoms of burning or frequent urination. The following conditions increase risk of asymptomatic bacteriuria:  Diabetes mellitus.  Advanced age.  Pregnancy in the first trimester.  Kidney stones.  Kidney transplants.  Leaky kidney tube valve in young children (reflux). Treatment for asymptomatic bacteriuria is not required in most people and can lead to other problems such as yeast overgrowth and development of resistant bacteria. However, some people, such as pregnant women, do need treatment to prevent kidney infection. Asymptomatic bacteriuria in pregnancy is also associated with fetal growth restriction, premature labor, and newborn death. HOME CARE INSTRUCTIONS Monitor your bacteriuria for any changes. The following actions may help to alleviate any discomfort you are experiencing:  Drink enough water and fluids to keep your urine clear or pale yellow. Go to the bathroom more frequently to keep your bladder empty.  Keep the area around your vagina and rectum clean. Wipe yourself from front to back after urinating. SEEK IMMEDIATE MEDICAL CARE IF:  You develop signs of an infection such asburning with urination, frequency of voiding, back pain, or fever.  You have blood in the urine.  You develop a fever. Document Released: 05/03/2005 Document Revised: 01/03/2013 Document Reviewed: 10/23/2012 ExitCare Patient Information 2014 [employer].  

## 2013-07-24 ENCOUNTER — Other Ambulatory Visit: Payer: Medicare Other

## 2013-07-24 ENCOUNTER — Other Ambulatory Visit (INDEPENDENT_AMBULATORY_CARE_PROVIDER_SITE_OTHER): Payer: Medicare Other

## 2013-07-24 DIAGNOSIS — Z1212 Encounter for screening for malignant neoplasm of rectum: Secondary | ICD-10-CM

## 2013-07-24 DIAGNOSIS — E559 Vitamin D deficiency, unspecified: Secondary | ICD-10-CM | POA: Diagnosis not present

## 2013-07-24 DIAGNOSIS — D649 Anemia, unspecified: Secondary | ICD-10-CM

## 2013-07-24 DIAGNOSIS — E785 Hyperlipidemia, unspecified: Secondary | ICD-10-CM | POA: Diagnosis not present

## 2013-07-24 DIAGNOSIS — R5381 Other malaise: Secondary | ICD-10-CM | POA: Diagnosis not present

## 2013-07-24 DIAGNOSIS — R5383 Other fatigue: Secondary | ICD-10-CM

## 2013-07-24 LAB — POCT CBC
Granulocyte percent: 59.6 %G (ref 37–80)
HCT, POC: 43.1 % (ref 37.7–47.9)
Hemoglobin: 13.8 g/dL (ref 12.2–16.2)
Lymph, poc: 1.4 (ref 0.6–3.4)
MCH, POC: 29.5 pg (ref 27–31.2)
MCHC: 32.1 g/dL (ref 31.8–35.4)
MCV: 91.9 fL (ref 80–97)
MPV: 8.2 fL (ref 0–99.8)
POC Granulocyte: 2.7 (ref 2–6.9)
POC LYMPH PERCENT: 30.5 %L (ref 10–50)
Platelet Count, POC: 172 10*3/uL (ref 142–424)
RBC: 4.7 M/uL (ref 4.04–5.48)
RDW, POC: 14.1 %
WBC: 4.5 10*3/uL — AB (ref 4.6–10.2)

## 2013-07-24 LAB — BASIC METABOLIC PANEL WITH GFR
BUN: 17 mg/dL (ref 6–23)
CO2: 32 mEq/L (ref 19–32)
Calcium: 9 mg/dL (ref 8.4–10.5)
Chloride: 104 mEq/L (ref 96–112)
Creat: 0.69 mg/dL (ref 0.50–1.10)
GFR, Est African American: >89 mL/min
GFR, Est Non African American: 89 mL/min
Glucose, Bld: 80 mg/dL (ref 70–99)
Potassium: 3.9 mEq/L (ref 3.5–5.3)
Sodium: 142 mEq/L (ref 135–145)

## 2013-07-24 LAB — HEPATIC FUNCTION PANEL
ALT: 14 U/L (ref 0–35)
AST: 22 U/L (ref 0–37)
Albumin: 3.8 g/dL (ref 3.5–5.2)
Alkaline Phosphatase: 43 U/L (ref 39–117)
Bilirubin, Direct: 0.1 mg/dL (ref 0.0–0.3)
Indirect Bilirubin: 0.4 mg/dL (ref 0.2–1.2)
Total Bilirubin: 0.5 mg/dL (ref 0.2–1.2)
Total Protein: 6.1 g/dL (ref 6.0–8.3)

## 2013-07-24 NOTE — Progress Notes (Signed)
Pt came in for labs only 

## 2013-07-24 NOTE — Progress Notes (Signed)
Pt dropped off FOBT only 

## 2013-07-25 LAB — NMR LIPOPROFILE WITH LIPIDS
Cholesterol, Total: 198 mg/dL (ref <200)
HDL Particle Number: 39 umol/L (ref >=30.5)
HDL Size: 9.7 nm (ref >=9.2)
HDL-C: 72 mg/dL (ref >=40)
LDL (calc): 109 mg/dL — ABNORMAL HIGH (ref <100)
LDL Particle Number: 1204 nmol/L — ABNORMAL HIGH (ref <1000)
LDL Size: 21.6 nm (ref >20.5)
LP-IR Score: <25 (ref <=45)
Large HDL-P: 13.7 umol/L (ref >=4.8)
Large VLDL-P: <0.8 nmol/L (ref <=2.7)
Small LDL Particle Number: 256 nmol/L (ref <=527)
Triglycerides: 83 mg/dL (ref <150)
VLDL Size: 39 nm (ref <=46.6)

## 2013-07-25 LAB — VITAMIN D 25 HYDROXY (VIT D DEFICIENCY, FRACTURES): Vit D, 25-Hydroxy: 72 ng/mL (ref 30–89)

## 2013-07-26 LAB — FECAL OCCULT BLOOD, IMMUNOCHEMICAL: Fecal Occult Bld: NEGATIVE

## 2013-07-30 ENCOUNTER — Ambulatory Visit: Payer: Medicare Other | Admitting: Family Medicine

## 2013-08-01 ENCOUNTER — Ambulatory Visit (INDEPENDENT_AMBULATORY_CARE_PROVIDER_SITE_OTHER): Payer: Medicare Other | Admitting: Family Medicine

## 2013-08-01 ENCOUNTER — Encounter: Payer: Self-pay | Admitting: Family Medicine

## 2013-08-01 VITALS — BP 121/68 | HR 64 | Temp 97.8°F | Ht 66.0 in | Wt 118.0 lb

## 2013-08-01 DIAGNOSIS — M810 Age-related osteoporosis without current pathological fracture: Secondary | ICD-10-CM

## 2013-08-01 DIAGNOSIS — J309 Allergic rhinitis, unspecified: Secondary | ICD-10-CM | POA: Diagnosis not present

## 2013-08-01 DIAGNOSIS — E785 Hyperlipidemia, unspecified: Secondary | ICD-10-CM

## 2013-08-01 DIAGNOSIS — E559 Vitamin D deficiency, unspecified: Secondary | ICD-10-CM | POA: Diagnosis not present

## 2013-08-01 DIAGNOSIS — M79675 Pain in left toe(s): Secondary | ICD-10-CM

## 2013-08-01 DIAGNOSIS — M79609 Pain in unspecified limb: Secondary | ICD-10-CM

## 2013-08-01 NOTE — Progress Notes (Signed)
Subjective:    Patient ID: Kayla Randolph, female    DOB: 04-15-1946, 68 y.o.   MRN: [id number]  HPI Pt here for follow up and management of chronic medical problems. The patient does complain of some pain in her left foot, fourth toe. Her FOBT was negative this month. Her lab work results will be reviewed with her today. She will be scheduled for a pelvic and Pap smear in May.        Patient Active Problem List   Diagnosis Date Noted  . Depression 04/03/2013  . Hyperlipemia 12/14/2012  . Vitamin D deficiency 12/14/2012  . Allergic rhinitis 12/14/2012  . 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 Osteopenia    Outpatient Encounter Prescriptions as of 08/01/2013  Medication Sig  . aspirin 81 MG tablet Take 81 mg by mouth daily.    . Calcium Carb-Cholecalciferol (CALCIUM 1000 + D PO) Take 1 tablet by mouth daily.    . Cholecalciferol (VITAMIN D3) 1000 UNITS CAPS Take 1 capsule by mouth daily.    . cycloSPORINE (RESTASIS) 0.05 % ophthalmic emulsion Place 1 drop into both eyes 2 (two) times daily.  Marland Kitchen escitalopram (LEXAPRO) 10 MG tablet TAKE (1) TABLET DAILY AS DIRECTED.  Marland Kitchen estradiol (ESTRACE) 0.1 MG/GM vaginal cream Place 1 Applicatorful vaginally at bedtime.  . fluticasone (FLONASE) 50 MCG/ACT nasal spray 1 SPRAY IN EACH NOSTRIL EVERY 12 HOURS  . Polyethylene Glycol 3350 (MIRALAX PO) Take 1 packet by mouth daily as needed.    . [DISCONTINUED] azithromycin (ZITHROMAX) 250 MG tablet Take 2 po first day and then one po qd x 4 days  . [DISCONTINUED] ciprofloxacin (CIPRO) 500 MG tablet Take 1 tablet (500 mg total) by mouth 2 (two) times daily.  . [DISCONTINUED] HYDROcodone-homatropine (HYCODAN) 5-1.5 MG/5ML syrup Take 5 mLs by mouth every 8 (eight) hours as needed for cough.  . [DISCONTINUED] oseltamivir (TAMIFLU) 75 MG capsule Take 1 capsule (75 mg total) by mouth 2 (two) times daily.    Review of Systems  Constitutional: Negative.   HENT:  Negative.   Eyes: Negative.   Respiratory: Negative.   Cardiovascular: Negative.   Gastrointestinal: Negative.   Endocrine: Negative.   Genitourinary: Negative.   Musculoskeletal: Negative.        Left foot- 4 th toe pain   Skin: Negative.   Allergic/Immunologic: Negative.   Neurological: Negative.   Hematological: Negative.   Psychiatric/Behavioral: Negative.        Objective:   Physical Exam  Nursing note and vitals reviewed. Constitutional: She is oriented to person, place, and time. She appears well-developed and well-nourished. No distress.  HENT:  Head: Normocephalic and atraumatic.  Right Ear: External ear normal.  Left Ear: External ear normal.  Mouth/Throat: Oropharynx is clear and moist.  Some nasal congestion bilaterally  Eyes: Conjunctivae and EOM are normal. Pupils are equal, round, and reactive to light. Right eye exhibits no discharge. Left eye exhibits no discharge. No scleral icterus.  Neck: Normal range of motion. Neck supple. No thyromegaly present.  No carotid bruits  Cardiovascular: Normal rate, regular rhythm, normal heart sounds and intact distal pulses.  Exam reveals no gallop and no friction rub.   No murmur heard. At 72 per minute  Pulmonary/Chest: Effort normal and breath sounds normal. No respiratory distress. She has no wheezes. She has no rales. She exhibits no tenderness.  Abdominal: Soft. Bowel sounds are normal. She exhibits no mass. There is no tenderness.  Musculoskeletal: Normal  range of motion. She exhibits no edema and no tenderness.  There was no abnormality or tenderness regarding the toes on both left foot  Lymphadenopathy:    She has no cervical adenopathy.  Neurological: She is alert and oriented to person, place, and time. She has normal reflexes. No cranial nerve deficit.  Skin: Skin is warm and dry. No rash noted.  Psychiatric: She has a normal mood and affect. Her behavior is normal. Judgment and thought content normal.   BP  121/68  Pulse 64  Temp(Src) 97.8 F (36.6 C) (Oral)  Ht 5\' 6"  (1.676 m)  Wt 118 lb (53.524 kg)  BMI 19.05 kg/m2        Assessment & Plan:  1. Hyperlipemia  2. Vitamin D deficiency  3. Osteopenia  4. Allergic rhinitis  5. Toe pain, left  Patient Instructions                       Medicare Annual Wellness Visit  [facility] and the medical providers at [facility] strive to bring you the best medical care.  In doing so we not only want to address your current medical conditions and concerns but also to detect new conditions early and prevent illness, disease and health-related problems.    Medicare offers a yearly Wellness Visit which allows our clinical staff to assess your need for preventative services including immunizations, lifestyle education, counseling to decrease risk of preventable diseases and screening for fall risk and other medical concerns.    This visit is provided free of charge (no copay) for all Medicare recipients. The clinical pharmacists at [facility] have begun to conduct these Wellness Visits which will also include a thorough review of all your medications.    As you primary medical provider recommend that you make an appointment for your Annual Wellness Visit if you have not done so already this year.  You may set up this appointment before you leave today or you may call back [phone]) and schedule an appointment.  Please make sure when you call that you mention that you are scheduling your Annual Wellness Visit with the clinical pharmacist so that the appointment may be made for the proper length of time.     Continue current medications. Continue good therapeutic lifestyle changes which include good diet and exercise. Fall precautions discussed with patient. If an FOBT was given today- please return it to our front desk. If you are over 37 years old - you may need Prevnar 2 or the adult Pneumonia  vaccine. Use flonase as directed.  Continue to exercise regularly and eat less ice cream Drink more water Take Claritin over-the-counter in addition to continuing to use your nose spray as you are currently doing Always wear good shoes that have plenty of width and depth with low heels   Arrie Senate MD

## 2013-08-01 NOTE — Patient Instructions (Addendum)
Medicare Annual Wellness Visit  [facility] and the medical providers at [facility] strive to bring you the best medical care.  In doing so we not only want to address your current medical conditions and concerns but also to detect new conditions early and prevent illness, disease and health-related problems.    Medicare offers a yearly Wellness Visit which allows our clinical staff to assess your need for preventative services including immunizations, lifestyle education, counseling to decrease risk of preventable diseases and screening for fall risk and other medical concerns.    This visit is provided free of charge (no copay) for all Medicare recipients. The clinical pharmacists at [facility] have begun to conduct these Wellness Visits which will also include a thorough review of all your medications.    As you primary medical provider recommend that you make an appointment for your Annual Wellness Visit if you have not done so already this year.  You may set up this appointment before you leave today or you may call back [phone]) and schedule an appointment.  Please make sure when you call that you mention that you are scheduling your Annual Wellness Visit with the clinical pharmacist so that the appointment may be made for the proper length of time.     Continue current medications. Continue good therapeutic lifestyle changes which include good diet and exercise. Fall precautions discussed with patient. If an FOBT was given today- please return it to our front desk. If you are over 59 years old - you may need Prevnar 39 or the adult Pneumonia vaccine. Use flonase as directed.  Continue to exercise regularly and eat less ice cream Drink more water Take Claritin over-the-counter in addition to continuing to use your nose spray as you are currently doing Always wear good shoes that have plenty of width and depth with low  heels

## 2013-08-09 ENCOUNTER — Encounter: Payer: Self-pay | Admitting: *Deleted

## 2013-08-09 NOTE — Progress Notes (Signed)
Quick Note:  Copy of labs sent to patient ______ 

## 2013-10-16 ENCOUNTER — Other Ambulatory Visit: Payer: Medicare Other | Admitting: Nurse Practitioner

## 2013-10-17 DIAGNOSIS — D239 Other benign neoplasm of skin, unspecified: Secondary | ICD-10-CM | POA: Diagnosis not present

## 2013-10-17 DIAGNOSIS — D485 Neoplasm of uncertain behavior of skin: Secondary | ICD-10-CM | POA: Diagnosis not present

## 2013-10-17 DIAGNOSIS — L820 Inflamed seborrheic keratosis: Secondary | ICD-10-CM | POA: Diagnosis not present

## 2013-10-17 DIAGNOSIS — L259 Unspecified contact dermatitis, unspecified cause: Secondary | ICD-10-CM | POA: Diagnosis not present

## 2013-11-19 ENCOUNTER — Encounter: Payer: Self-pay | Admitting: Nurse Practitioner

## 2013-11-19 ENCOUNTER — Ambulatory Visit (INDEPENDENT_AMBULATORY_CARE_PROVIDER_SITE_OTHER): Payer: Medicare Other | Admitting: Nurse Practitioner

## 2013-11-19 VITALS — BP 140/78 | HR 60 | Temp 98.0°F | Ht 66.0 in | Wt 118.4 lb

## 2013-11-19 DIAGNOSIS — Z01419 Encounter for gynecological examination (general) (routine) without abnormal findings: Secondary | ICD-10-CM

## 2013-11-19 DIAGNOSIS — Z124 Encounter for screening for malignant neoplasm of cervix: Secondary | ICD-10-CM | POA: Diagnosis not present

## 2013-11-19 NOTE — Progress Notes (Signed)
   Subjective:    Patient ID: Kayla Randolph, female    DOB: August 16, 1945, 68 y.o.   MRN: [id number]  HPI Patient is a regular patient of Dr. Laurance Flatten that is in today for Pap and pelvic only- She had her last follow up visit 07/1813. SHe is doing well today without complaints.    Review of Systems  Constitutional: Negative.   HENT: Negative.   Respiratory: Negative.   Cardiovascular: Negative.   Genitourinary: Negative.   Neurological: Negative.   Psychiatric/Behavioral: Negative.   All other systems reviewed and are negative.      Objective:   Physical Exam  Constitutional: She is oriented to person, place, and time. She appears well-developed and well-nourished.  HENT:  Head: Normocephalic.  Right Ear: Hearing, tympanic membrane, external ear and ear canal normal.  Left Ear: Hearing, tympanic membrane, external ear and ear canal normal.  Nose: Nose normal.  Mouth/Throat: Uvula is midline and oropharynx is clear and moist.  Eyes: Conjunctivae and EOM are normal. Pupils are equal, round, and reactive to light.  Neck: Normal range of motion and full passive range of motion without pain. Neck supple. No JVD present. Carotid bruit is not present. No mass and no thyromegaly present.  Cardiovascular: Normal rate, normal heart sounds and intact distal pulses.   No murmur heard. Pulmonary/Chest: Effort normal and breath sounds normal. Right breast exhibits no inverted nipple, no mass, no nipple discharge, no skin change and no tenderness. Left breast exhibits no inverted nipple, no mass, no nipple discharge, no skin change and no tenderness.  Abdominal: Soft. Bowel sounds are normal. She exhibits no mass. There is no tenderness.  Genitourinary: Vagina normal and uterus normal. No breast swelling, tenderness, discharge or bleeding.  bimanual exam-No adnexal masses or tenderness. Cervix parous and pink no discharge  Musculoskeletal: Normal range of motion.  Lymphadenopathy:    She has no  cervical adenopathy.  Neurological: She is alert and oriented to person, place, and time.  Skin: Skin is warm and dry.  Psychiatric: She has a normal mood and affect. Her behavior is normal. Judgment and thought content normal.   BP 140/78  Pulse 60  Temp(Src) 98 F (36.7 C) (Oral)  Ht 5\' 6"  (1.676 m)  Wt 118 lb 6.4 oz (53.706 kg)  BMI 19.12 kg/m2         Assessment & Plan:   1. Encounter for routine gynecological examination    Keep follow up appointments with Dr. Mayra Neer, FNP

## 2013-11-19 NOTE — Patient Instructions (Signed)
Pap Test A Pap test is a procedure done in a clinic office to evaluate cells that are on the surface of the cervix. The cervix is the lower portion of the uterus and upper portion of the vagina. For some women, the cervical region has the potential to form cancer. With consistent evaluations by your caregiver, this type of cancer can be prevented.  If a Pap test is abnormal, it is most often a result of a previous exposure to human papillomavirus (HPV). HPV is a virus that can infect the cells of the cervix and cause dysplasia. Dysplasia is where the cells no longer look normal. If a woman has been diagnosed with high-grade or severe dysplasia, they are at higher risk of developing cervical cancer. People diagnosed with low-grade dysplasia should still be seen by their caregiver because there is a small chance that low-grade dysplasia could develop into cancer.  LET YOUR CAREGIVER KNOW ABOUT:  Recent sexually transmitted infection (STI) you have had.  Any new sex partners you have had.  History of previous abnormal Pap tests results.  History of previous cervical procedures you have had (colposcopy, biopsy, loop electrosurgical excision procedure [LEEP]).  Concerns you have had regarding unusual vaginal discharge.  History of pelvic pain.  Your use of birth control. BEFORE THE PROCEDURE  Ask your caregiver when to schedule your Pap test. It is best not to be on your period if your caregiver uses a wooden spatula to collect cells or applies cells to a glass slide. Newer techniques are not so sensitive to the timing of a menstrual cycle.  Do not douche or have sexual intercourse for 24 hours before the test.   Do not use vaginal creams or tampons for 24 hours before the test.   Empty your bladder just before the test to lessen any discomfort.  PROCEDURE You will lie on an exam table with your feet in stirrups. A warm metal or plastic instrument (speculum) is placed in your vagina. This  instrument allows your caregiver to see the inside of your vagina and look at your cervix. A small, plastic brush or wooden spatula is then used to collect cervical cells. These cells are placed in a lab specimen container. The cells are looked at under a microscope. A specialist will determine if the cells are normal.  AFTER THE PROCEDURE Make sure to get your test results.If your results come back abnormal, you may need further testing.  Document Released: 07/24/2002 Document Revised: 07/26/2011 Document Reviewed: 04/29/2011 Exit[employer] Patient Information 2015 [employer]. This information is not intended to replace advice given to you by your health care provider. Make sure you discuss any questions you have with your health care provider.  

## 2013-11-20 LAB — PAP IG (IMAGE GUIDED): PAP Smear Comment: 0

## 2013-12-04 ENCOUNTER — Ambulatory Visit: Payer: Medicare Other | Admitting: Family Medicine

## 2013-12-08 ENCOUNTER — Ambulatory Visit (INDEPENDENT_AMBULATORY_CARE_PROVIDER_SITE_OTHER): Payer: Medicare Other | Admitting: Family Medicine

## 2013-12-08 ENCOUNTER — Encounter: Payer: Self-pay | Admitting: Family Medicine

## 2013-12-08 VITALS — BP 120/73 | HR 66 | Temp 97.4°F | Ht 66.0 in | Wt 114.0 lb

## 2013-12-08 DIAGNOSIS — M25561 Pain in right knee: Secondary | ICD-10-CM

## 2013-12-08 DIAGNOSIS — M25569 Pain in unspecified knee: Secondary | ICD-10-CM

## 2013-12-08 MED ORDER — MELOXICAM 15 MG PO TABS: ORAL_TABLET | ORAL | Status: DC

## 2013-12-08 NOTE — Patient Instructions (Signed)
Take medication as directed Use warm wet compresses to knee for 20 minutes 3 or 4 times daily Be careful not to fall. Avoid climbing pushing pulling et cetera.

## 2013-12-08 NOTE — Progress Notes (Signed)
Subjective:    Patient ID: Kayla Randolph, female    DOB: [birth year], 68 y.o.   MRN: [id number]  HPI Patient here today for right knee pain. Patient stated that this gets worse throughout the day. This this discomfort about 2 months ago when they were at the beach. They're walking on very unlevel sand and she started having trouble with her knee at that time. It seems to be getting worse since then. She has been taking ibuprofen over-the-counter and this is given her some relief but just not the relief she needs. She denies any GI irritation or problems. Her last BMP was within normal limits .        Patient Active Problem List   Diagnosis Date Noted  . Depression 04/03/2013  . Hyperlipemia 12/14/2012  . Vitamin D deficiency 12/14/2012  . Allergic rhinitis 12/14/2012  . 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 Osteopenia    Outpatient Encounter Prescriptions as of 12/08/2013  Medication Sig  . aspirin 81 MG tablet Take 81 mg by mouth daily.    . Calcium Carb-Cholecalciferol (CALCIUM 1000 + D PO) Take 1 tablet by mouth daily.    . Cholecalciferol (VITAMIN D3) 1000 UNITS CAPS Take 1 capsule by mouth daily.    . cycloSPORINE (RESTASIS) 0.05 % ophthalmic emulsion Place 1 drop into both eyes 2 (two) times daily.  Marland Kitchen escitalopram (LEXAPRO) 10 MG tablet TAKE (1) TABLET DAILY AS DIRECTED.  Marland Kitchen estradiol (ESTRACE) 0.1 MG/GM vaginal cream Place 1 Applicatorful vaginally at bedtime.  . fluticasone (FLONASE) 50 MCG/ACT nasal spray 1 SPRAY IN EACH NOSTRIL EVERY 12 HOURS  . Polyethylene Glycol 3350 (MIRALAX PO) Take 1 packet by mouth daily as needed.      Review of Systems  Constitutional: Negative.   HENT: Negative.   Eyes: Negative.   Respiratory: Negative.   Cardiovascular: Negative.   Gastrointestinal: Negative.   Endocrine: Negative.   Genitourinary: Negative.   Musculoskeletal: Positive for arthralgias (right knee pain). Back pain: slight back  pain last night.  Skin: Negative.   Allergic/Immunologic: Negative.   Neurological: Negative.   Hematological: Negative.   Psychiatric/Behavioral: Negative.        Objective:   Physical Exam  Nursing note and vitals reviewed. Constitutional: She is oriented to person, place, and time. She appears well-developed and well-nourished. No distress.  Musculoskeletal: Normal range of motion. She exhibits tenderness. She exhibits no edema.  Good range of motion of both lower extremities. Flexing and lowering the leg on either side did not reproduce any back pain. With flexion and extension of the right knee with internal and lateral rotation there was some joint discomfort at the right knee lateral joint line. Leg raising against resistance and lowering against resistance were normal other than some slight discomfort in the right knee.  Neurological: She is alert and oriented to person, place, and time.  Skin: Skin is warm and dry. No rash noted.  Psychiatric: She has a normal mood and affect. Her behavior is normal. Judgment and thought content normal.   BP 120/73  Pulse 66  Temp(Src) 97.4 F (36.3 C) (Oral)  Ht 5\' 6"  (1.676 m)  Wt 114 lb (51.71 kg)  BMI 18.41 kg/m2        Assessment & Plan:  1. Right knee pain - meloxicam (MOBIC) 15 MG tablet; One half to one pill daily as directed after eating  Dispense: 30 tablet; Refill: 0  Patient Instructions  Take medication  as directed Use warm wet compresses to knee for 20 minutes 3 or 4 times daily Be careful not to fall. Avoid climbing pushing pulling et cetera.   Arrie Senate MD

## 2013-12-10 ENCOUNTER — Other Ambulatory Visit: Payer: Self-pay | Admitting: *Deleted

## 2013-12-10 ENCOUNTER — Ambulatory Visit (INDEPENDENT_AMBULATORY_CARE_PROVIDER_SITE_OTHER): Payer: Medicare Other

## 2013-12-10 ENCOUNTER — Other Ambulatory Visit: Payer: Medicare Other

## 2013-12-10 DIAGNOSIS — M545 Low back pain, unspecified: Secondary | ICD-10-CM | POA: Diagnosis not present

## 2013-12-10 DIAGNOSIS — M25569 Pain in unspecified knee: Secondary | ICD-10-CM | POA: Diagnosis not present

## 2013-12-10 DIAGNOSIS — M25561 Pain in right knee: Secondary | ICD-10-CM

## 2013-12-11 ENCOUNTER — Ambulatory Visit: Payer: Medicare Other | Admitting: Family Medicine

## 2013-12-11 ENCOUNTER — Telehealth: Payer: Self-pay | Admitting: Family Medicine

## 2013-12-11 ENCOUNTER — Other Ambulatory Visit: Payer: Self-pay | Admitting: *Deleted

## 2013-12-11 DIAGNOSIS — M25561 Pain in right knee: Secondary | ICD-10-CM

## 2013-12-12 NOTE — Telephone Encounter (Signed)
Pt aware that it will be next thurs

## 2013-12-20 DIAGNOSIS — M25569 Pain in unspecified knee: Secondary | ICD-10-CM | POA: Diagnosis not present

## 2014-01-02 ENCOUNTER — Other Ambulatory Visit: Payer: Self-pay

## 2014-01-02 ENCOUNTER — Telehealth: Payer: Self-pay | Admitting: Family Medicine

## 2014-01-02 DIAGNOSIS — Z1231 Encounter for screening mammogram for malignant neoplasm of breast: Secondary | ICD-10-CM

## 2014-01-02 DIAGNOSIS — M810 Age-related osteoporosis without current pathological fracture: Secondary | ICD-10-CM

## 2014-01-02 DIAGNOSIS — E559 Vitamin D deficiency, unspecified: Secondary | ICD-10-CM

## 2014-01-02 DIAGNOSIS — E785 Hyperlipidemia, unspecified: Secondary | ICD-10-CM

## 2014-01-03 NOTE — Telephone Encounter (Signed)
Pt lab orders placed for her to come

## 2014-01-04 ENCOUNTER — Other Ambulatory Visit (INDEPENDENT_AMBULATORY_CARE_PROVIDER_SITE_OTHER): Payer: Medicare Other

## 2014-01-04 DIAGNOSIS — E785 Hyperlipidemia, unspecified: Secondary | ICD-10-CM | POA: Diagnosis not present

## 2014-01-04 DIAGNOSIS — E559 Vitamin D deficiency, unspecified: Secondary | ICD-10-CM | POA: Diagnosis not present

## 2014-01-04 DIAGNOSIS — M810 Age-related osteoporosis without current pathological fracture: Secondary | ICD-10-CM | POA: Diagnosis not present

## 2014-01-04 LAB — POCT CBC
Granulocyte percent: 76.4 %G (ref 37–80)
HCT, POC: 45.5 % (ref 37.7–47.9)
Hemoglobin: 14.6 g/dL (ref 12.2–16.2)
Lymph, poc: 1 (ref 0.6–3.4)
MCH, POC: 29.7 pg (ref 27–31.2)
MCHC: 32.2 g/dL (ref 31.8–35.4)
MCV: 92.5 fL (ref 80–97)
MPV: 8.3 fL (ref 0–99.8)
POC Granulocyte: 4 (ref 2–6.9)
POC LYMPH PERCENT: 18.7 %L (ref 10–50)
Platelet Count, POC: 151 10*3/uL (ref 142–424)
RBC: 4.9 M/uL (ref 4.04–5.48)
RDW, POC: 13.8 %
WBC: 5.2 10*3/uL (ref 4.6–10.2)

## 2014-01-04 NOTE — Progress Notes (Signed)
Pt came in for lab  only 

## 2014-01-05 LAB — NMR, LIPOPROFILE
Cholesterol: 186 mg/dL (ref 100–199)
HDL Cholesterol by NMR: 78 mg/dL (ref >39)
HDL Particle Number: 37.8 umol/L (ref >=30.5)
LDL Particle Number: 957 nmol/L (ref <1000)
LDL Size: 21.6 nm (ref >20.5)
LDLC SERPL CALC-MCNC: 91 mg/dL (ref 0–99)
LP-IR Score: <25 (ref <=45)
Small LDL Particle Number: <90 nmol/L (ref <=527)
Triglycerides by NMR: 87 mg/dL (ref 0–149)

## 2014-01-05 LAB — HEPATIC FUNCTION PANEL
ALT: 17 IU/L (ref 0–32)
AST: 22 IU/L (ref 0–40)
Albumin: 4.4 g/dL (ref 3.6–4.8)
Alkaline Phosphatase: 49 IU/L (ref 39–117)
Bilirubin, Direct: 0.1 mg/dL (ref 0.00–0.40)
Total Bilirubin: 0.4 mg/dL (ref 0.0–1.2)
Total Protein: 6.3 g/dL (ref 6.0–8.5)

## 2014-01-05 LAB — BMP8+EGFR
BUN/Creatinine Ratio: 21 (ref 11–26)
BUN: 16 mg/dL (ref 8–27)
CO2: 27 mmol/L (ref 18–29)
Calcium: 9.6 mg/dL (ref 8.7–10.3)
Chloride: 99 mmol/L (ref 97–108)
Creatinine, Ser: 0.77 mg/dL (ref 0.57–1.00)
GFR calc Af Amer: 91 mL/min/{1.73_m2} (ref >59)
GFR calc non Af Amer: 79 mL/min/{1.73_m2} (ref >59)
Glucose: 78 mg/dL (ref 65–99)
Potassium: 4 mmol/L (ref 3.5–5.2)
Sodium: 141 mmol/L (ref 134–144)

## 2014-01-05 LAB — VITAMIN D 25 HYDROXY (VIT D DEFICIENCY, FRACTURES): Vit D, 25-Hydroxy: 71.2 ng/mL (ref 30.0–100.0)

## 2014-01-09 ENCOUNTER — Ambulatory Visit (INDEPENDENT_AMBULATORY_CARE_PROVIDER_SITE_OTHER): Payer: Medicare Other

## 2014-01-09 ENCOUNTER — Ambulatory Visit (INDEPENDENT_AMBULATORY_CARE_PROVIDER_SITE_OTHER): Payer: Medicare Other | Admitting: Family Medicine

## 2014-01-09 ENCOUNTER — Telehealth: Payer: Self-pay

## 2014-01-09 ENCOUNTER — Encounter: Payer: Self-pay | Admitting: Family Medicine

## 2014-01-09 VITALS — BP 131/72 | HR 62 | Temp 98.2°F | Ht 66.0 in | Wt 113.0 lb

## 2014-01-09 DIAGNOSIS — L2089 Other atopic dermatitis: Secondary | ICD-10-CM

## 2014-01-09 DIAGNOSIS — M25559 Pain in unspecified hip: Secondary | ICD-10-CM

## 2014-01-09 DIAGNOSIS — G5690 Unspecified mononeuropathy of unspecified upper limb: Secondary | ICD-10-CM | POA: Diagnosis not present

## 2014-01-09 DIAGNOSIS — L209 Atopic dermatitis, unspecified: Secondary | ICD-10-CM

## 2014-01-09 DIAGNOSIS — E785 Hyperlipidemia, unspecified: Secondary | ICD-10-CM | POA: Diagnosis not present

## 2014-01-09 DIAGNOSIS — J301 Allergic rhinitis due to pollen: Secondary | ICD-10-CM

## 2014-01-09 DIAGNOSIS — M25569 Pain in unspecified knee: Secondary | ICD-10-CM

## 2014-01-09 DIAGNOSIS — E559 Vitamin D deficiency, unspecified: Secondary | ICD-10-CM

## 2014-01-09 DIAGNOSIS — Z13820 Encounter for screening for osteoporosis: Secondary | ICD-10-CM

## 2014-01-09 NOTE — Patient Instructions (Addendum)
Medicare Annual Wellness Visit  [facility] and the medical providers at [facility] strive to bring you the best medical care.  In doing so we not only want to address your current medical conditions and concerns but also to detect new conditions early and prevent illness, disease and health-related problems.    Medicare offers a yearly Wellness Visit which allows our clinical staff to assess your need for preventative services including immunizations, lifestyle education, counseling to decrease risk of preventable diseases and screening for fall risk and other medical concerns.    This visit is provided free of charge (no copay) for all Medicare recipients. The clinical pharmacists at [facility] have begun to conduct these Wellness Visits which will also include a thorough review of all your medications.    As you primary medical provider recommend that you make an appointment for your Annual Wellness Visit if you have not done so already this year.  You may set up this appointment before you leave today or you may call back ([phone]) and schedule an appointment.  Please make sure when you call that you mention that you are scheduling your Annual Wellness Visit with the clinical pharmacist so that the appointment may be made for the proper length of time.     Continue current medications. Continue good therapeutic lifestyle changes which include good diet and exercise. Fall precautions discussed with patient. If an FOBT was given today- please return it to our front desk. If you are over 2 years old - you may need Prevnar 27 or the adult Pneumonia vaccine.  Flu Shots will be available at our office starting mid- September. Please call and schedule a FLU CLINIC APPOINTMENT.   Continue to use unscented fabric softeners soaps and detergents Use cortisone 10 over-the-counter sparingly to the left wrist Change the watch to the  other wrist for a few days Increase the number of sprays of Flonase to 2 sprays each nostril once daily especially during seasons of more pollen and allergy Continue to take the Mobic as needed for joint problems, take as little as possible and break in half and 2 and always take it after eating. Continue to walk and exercise as much as possible

## 2014-01-09 NOTE — Progress Notes (Signed)
Subjective:    Patient ID: Kayla Randolph, female    DOB: 16-May-1946, 68 y.o.   MRN: [id number]  HPI Pt here for follow up and management of chronic medical problems. The patient is doing well except for some sinus congestion and allergy and she also complains of a rash on her left wrist. Recent labs were reviewed with the patient.        Patient Active Problem List   Diagnosis Date Noted  . Depression 04/03/2013  . Hyperlipemia 12/14/2012  . Vitamin D deficiency 12/14/2012  . Allergic rhinitis 12/14/2012  . 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 Osteopenia    Outpatient Encounter Prescriptions as of 01/09/2014  Medication Sig  . aspirin 81 MG tablet Take 81 mg by mouth daily.    . Calcium Carb-Cholecalciferol (CALCIUM 1000 + D PO) Take 1 tablet by mouth daily.    . Cholecalciferol (VITAMIN D3) 1000 UNITS CAPS Take 1 capsule by mouth daily.    . cycloSPORINE (RESTASIS) 0.05 % ophthalmic emulsion Place 1 drop into both eyes 2 (two) times daily.  Marland Kitchen escitalopram (LEXAPRO) 10 MG tablet TAKE (1) TABLET DAILY AS DIRECTED.  Marland Kitchen estradiol (ESTRACE) 0.1 MG/GM vaginal cream Place 1 Applicatorful vaginally at bedtime.  . fluticasone (FLONASE) 50 MCG/ACT nasal spray 1 SPRAY IN EACH NOSTRIL EVERY 12 HOURS  . meloxicam (MOBIC) 15 MG tablet One half to one pill daily as directed after eating  . Polyethylene Glycol 3350 (MIRALAX PO) Take 1 packet by mouth daily as needed.      Review of Systems  Constitutional: Negative.   HENT: Positive for postnasal drip and sinus pressure.   Eyes: Negative.   Respiratory: Negative.   Cardiovascular: Negative.   Gastrointestinal: Negative.   Endocrine: Negative.   Genitourinary: Negative.   Musculoskeletal: Negative.   Skin: Positive for rash (left wrist).  Allergic/Immunologic: Negative.   Neurological: Negative.   Hematological: Negative.   Psychiatric/Behavioral: Negative.        Objective:   Physical  Exam  Nursing note and vitals reviewed. Constitutional: She is oriented to person, place, and time. She appears well-developed and well-nourished. No distress.  The patient is alert and pleasant and feeling better with her joint symptoms.  HENT:  Head: Normocephalic and atraumatic.  Right Ear: External ear normal.  Left Ear: External ear normal.  Mouth/Throat: Oropharynx is clear and moist. No oropharyngeal exudate.  Some nasal congestion bilaterally  Eyes: Conjunctivae and EOM are normal. Pupils are equal, round, and reactive to light. Right eye exhibits no discharge. Left eye exhibits no discharge. No scleral icterus.  Neck: Normal range of motion. Neck supple. No thyromegaly present.  No carotid bruits or adenopathy  Cardiovascular: Normal rate, regular rhythm, normal heart sounds and intact distal pulses.   No murmur heard. At 72 per minute  Pulmonary/Chest: Effort normal and breath sounds normal. No respiratory distress. She has no wheezes. She has no rales. She exhibits no tenderness.  Abdominal: Soft. Bowel sounds are normal. She exhibits no mass. There is no tenderness. There is no rebound and no guarding.  Musculoskeletal: Normal range of motion. She exhibits no edema and no tenderness.  Lymphadenopathy:    She has no cervical adenopathy.  Neurological: She is alert and oriented to person, place, and time. She has normal reflexes.  Skin: Skin is warm and dry. Rash noted.  Atopic dermatitis volar surface left wrist  Psychiatric: She has a normal mood and affect. Her behavior  is normal. Judgment and thought content normal.   BP 131/72  Pulse 62  Temp(Src) 98.2 F (36.8 C) (Oral)  Ht 5\' 6"  (1.676 m)  Wt 113 lb (51.256 kg)  BMI 18.25 kg/m2        Assessment & Plan:  1. Hyperlipemia - DG Chest 2 View; Future  2. Vitamin D deficiency  3. Upper extremity neuropathy, unspecified laterality - DG Chest 2 View; Future  4. Screening for osteoporosis - DG Bone Density;  Future  5. Chronic arthralgias of knees and hips, unspecified laterality -Stable with when necessary Mobic  6. Allergic rhinitis due to pollen  7. Atopic dermatitis Patient Instructions                       Medicare Annual Wellness Visit  [facility] and the medical providers at [facility] strive to bring you the best medical care.  In doing so we not only want to address your current medical conditions and concerns but also to detect new conditions early and prevent illness, disease and health-related problems.    Medicare offers a yearly Wellness Visit which allows our clinical staff to assess your need for preventative services including immunizations, lifestyle education, counseling to decrease risk of preventable diseases and screening for fall risk and other medical concerns.    This visit is provided free of charge (no copay) for all Medicare recipients. The clinical pharmacists at [facility] have begun to conduct these Wellness Visits which will also include a thorough review of all your medications.    As you primary medical provider recommend that you make an appointment for your Annual Wellness Visit if you have not done so already this year.  You may set up this appointment before you leave today or you may call back ([phone]) and schedule an appointment.  Please make sure when you call that you mention that you are scheduling your Annual Wellness Visit with the clinical pharmacist so that the appointment may be made for the proper length of time.     Continue current medications. Continue good therapeutic lifestyle changes which include good diet and exercise. Fall precautions discussed with patient. If an FOBT was given today- please return it to our front desk. If you are over 36 years old - you may need Prevnar 7 or the adult Pneumonia vaccine.  Flu Shots will be available at our office starting mid- September. Please call  and schedule a FLU CLINIC APPOINTMENT.   Continue to use unscented fabric softeners soaps and detergents Use cortisone 10 over-the-counter sparingly to the left wrist Change the watch to the other wrist for a few days Increase the number of sprays of Flonase to 2 sprays each nostril once daily especially during seasons of more pollen and allergy Continue to take the Mobic as needed for joint problems, take as little as possible and break in half and 2 and always take it after eating. Continue to walk and exercise as much as possible   Arrie Senate MD

## 2014-01-09 NOTE — Telephone Encounter (Signed)
Message copied by Koren Bound on Wed Jan 09, 2014  4:56 PM ------      Message from: Chipper Herb      Created: Wed Jan 09, 2014  1:07 PM       As per radiology report ------

## 2014-01-09 NOTE — Telephone Encounter (Signed)
Pt aware of CXR results.

## 2014-01-11 ENCOUNTER — Other Ambulatory Visit: Payer: Self-pay | Admitting: Family Medicine

## 2014-01-16 ENCOUNTER — Ambulatory Visit
Admission: RE | Admit: 2014-01-16 | Discharge: 2014-01-16 | Disposition: A | Discharge status: Home / Self Care | Payer: Medicare Other | Source: Ambulatory Visit

## 2014-01-16 DIAGNOSIS — Z1231 Encounter for screening mammogram for malignant neoplasm of breast: Secondary | ICD-10-CM | POA: Diagnosis not present

## 2014-03-14 ENCOUNTER — Ambulatory Visit (INDEPENDENT_AMBULATORY_CARE_PROVIDER_SITE_OTHER): Payer: Medicare Other

## 2014-03-14 DIAGNOSIS — Z23 Encounter for immunization: Secondary | ICD-10-CM | POA: Diagnosis not present

## 2014-03-20 ENCOUNTER — Other Ambulatory Visit: Payer: Medicare Other

## 2014-03-20 ENCOUNTER — Ambulatory Visit: Payer: Medicare Other

## 2014-03-27 ENCOUNTER — Ambulatory Visit (INDEPENDENT_AMBULATORY_CARE_PROVIDER_SITE_OTHER): Payer: Medicare Other | Admitting: Pharmacist

## 2014-03-27 ENCOUNTER — Ambulatory Visit (INDEPENDENT_AMBULATORY_CARE_PROVIDER_SITE_OTHER): Payer: Medicare Other

## 2014-03-27 ENCOUNTER — Encounter: Payer: Self-pay | Admitting: Pharmacist

## 2014-03-27 VITALS — Ht 66.0 in | Wt 117.0 lb

## 2014-03-27 DIAGNOSIS — Z13820 Encounter for screening for osteoporosis: Secondary | ICD-10-CM

## 2014-03-27 DIAGNOSIS — M899 Disorder of bone, unspecified: Secondary | ICD-10-CM | POA: Diagnosis not present

## 2014-03-27 DIAGNOSIS — M8580 Other specified disorders of bone density and structure, unspecified site: Secondary | ICD-10-CM

## 2014-03-27 NOTE — Progress Notes (Signed)
Patient ID: Kayla Randolph, female   DOB: 1945-06-30, 68 y.o.   MRN: [id number]  Osteoporosis Clinic Current Height: Height: 5\' 6"  (167.6 cm)      Max Lifetime Height:  5\' 7"  Current Weight: Weight: 117 lb (53.071 kg)       Ethnicity:Caucasian    HPI: Does pt already have a diagnosis of:  Osteopenia?  Yes Osteoporosis?  No  Back Pain?  No       Kyphosis?  No but DEXA shows significant bend in spine Prior fracture?  No Med(s) for Osteoporosis/Osteopenia:  Calcium and vitamin D Med(s) previously tried for Osteoporosis/Osteopenia:  Fosamax - took for 12 years (1999 through 2011)                                                             PMH: Age at menopause:  4's Hysterectomy?  No Oophorectomy?  No HRT? Yes - Current.  Type/duration: estradiol vaginal cream 1-2 times weekly Steroid Use?  No Thyroid med?  No History of cancer?  No History of digestive disorders (ie Crohn's)?  No Current or previous eating disorders?  No Last Vitamin D Result:  71.2 (01/04/2014) Last GFR Result:  79(01/04/2014)   FH/SH: Family history of osteoporosis?  Yes - maternal aunt Parent with history of hip fracture?  No Family history of breast cancer?  No Exercise?  No Smoking?  No Alcohol?  No    Calcium Assessment Calcium Intake  # of servings/day  Calcium mg  Milk (8 oz) 1  x  300  = 300mg   Yogurt (4 oz) 0 x  200 = 0  Cheese (1 oz) 0 x  200 = 0  Other Calcium sources   250mg   Ca supplement 500mg  tid = 1500mg    Estimated calcium intake per day 2050mg     DEXA Results Date of Test T-Score for AP Spine L1-L4 T-Score for Total Left Hip T-Score for Total Right Hip  03/27/2014 -1.8 -1.8 -1.7  03/15/2012 -1.7 -1.6 -1.5  05/28/2009 -1.8 -1.7 -1.5  04/03/2007 -2.1 -1.7 -1.6   FRAX 10 year estimate: Total FX risk:  11%  (consider medication if >/= 20%) Hip FX risk:  2.5%  (consider medication if >/= 3%)  Assessment: Osteopenia - stable  Recommendations: 1.  Discussed fracture risk and  DEXA results 2.  continue calcium 1200mg  daily through supplementation or diet.  3.  continue weight bearing exercise - 30 minutes at least 4 days per week.  I stressed the benefits of core exercises to help with spine/back health.  Patient to start going to [facility] yoga (she has done in past). 4.  Counseled and educated about fall risk and prevention.  Recheck DEXA:  2 years  Time spent counseling patient:  20 minutes   Cherre Robins, PharmD, CPP

## 2014-03-27 NOTE — Patient Instructions (Signed)

## 2014-04-18 DIAGNOSIS — H2513 Age-related nuclear cataract, bilateral: Secondary | ICD-10-CM | POA: Diagnosis not present

## 2014-04-18 DIAGNOSIS — H04123 Dry eye syndrome of bilateral lacrimal glands: Secondary | ICD-10-CM | POA: Diagnosis not present

## 2014-04-24 ENCOUNTER — Other Ambulatory Visit: Payer: Self-pay | Admitting: Family Medicine

## 2014-05-20 ENCOUNTER — Other Ambulatory Visit (INDEPENDENT_AMBULATORY_CARE_PROVIDER_SITE_OTHER): Payer: Medicare Other

## 2014-05-20 DIAGNOSIS — E785 Hyperlipidemia, unspecified: Secondary | ICD-10-CM | POA: Diagnosis not present

## 2014-05-20 DIAGNOSIS — E559 Vitamin D deficiency, unspecified: Secondary | ICD-10-CM

## 2014-05-20 LAB — POCT CBC
Granulocyte percent: 77.1 %G (ref 37–80)
HCT, POC: 46 % (ref 37.7–47.9)
Hemoglobin: 14 g/dL (ref 12.2–16.2)
Lymph, poc: 1.1 (ref 0.6–3.4)
MCH, POC: 28 pg (ref 27–31.2)
MCHC: 30.4 g/dL — AB (ref 31.8–35.4)
MCV: 92.2 fL (ref 80–97)
MPV: 8.7 fL (ref 0–99.8)
POC Granulocyte: 4.3 (ref 2–6.9)
POC LYMPH PERCENT: 19.3 %L (ref 10–50)
Platelet Count, POC: 172 10*3/uL (ref 142–424)
RBC: 5 M/uL (ref 4.04–5.48)
RDW, POC: 14.4 %
WBC: 5.6 10*3/uL (ref 4.6–10.2)

## 2014-05-20 NOTE — Progress Notes (Signed)
Lab only 

## 2014-05-21 LAB — BMP8+EGFR
BUN/Creatinine Ratio: 17 (ref 11–26)
BUN: 12 mg/dL (ref 8–27)
CO2: 28 mmol/L (ref 18–29)
Calcium: 9.3 mg/dL (ref 8.7–10.3)
Chloride: 102 mmol/L (ref 97–108)
Creatinine, Ser: 0.7 mg/dL (ref 0.57–1.00)
GFR calc Af Amer: 102 mL/min/{1.73_m2} (ref >59)
GFR calc non Af Amer: 89 mL/min/{1.73_m2} (ref >59)
Glucose: 85 mg/dL (ref 65–99)
Potassium: 3.9 mmol/L (ref 3.5–5.2)
Sodium: 144 mmol/L (ref 134–144)

## 2014-05-21 LAB — NMR, LIPOPROFILE
Cholesterol: 193 mg/dL (ref 100–199)
HDL Cholesterol by NMR: 79 mg/dL (ref >39)
HDL Particle Number: 40.5 umol/L (ref >=30.5)
LDL Particle Number: 1079 nmol/L — ABNORMAL HIGH (ref <1000)
LDL Size: 21.4 nm (ref >20.5)
LDL-C: 92 mg/dL (ref 0–99)
LP-IR Score: <25 (ref <=45)
Small LDL Particle Number: 132 nmol/L (ref <=527)
Triglycerides by NMR: 111 mg/dL (ref 0–149)

## 2014-05-21 LAB — HEPATIC FUNCTION PANEL
ALT: 15 IU/L (ref 0–32)
AST: 27 IU/L (ref 0–40)
Albumin: 4.2 g/dL (ref 3.6–4.8)
Alkaline Phosphatase: 48 IU/L (ref 39–117)
Bilirubin, Direct: 0.12 mg/dL (ref 0.00–0.40)
Total Bilirubin: 0.4 mg/dL (ref 0.0–1.2)
Total Protein: 5.9 g/dL — ABNORMAL LOW (ref 6.0–8.5)

## 2014-05-21 LAB — VITAMIN D 25 HYDROXY (VIT D DEFICIENCY, FRACTURES): Vit D, 25-Hydroxy: 72.2 ng/mL (ref 30.0–100.0)

## 2014-05-23 ENCOUNTER — Ambulatory Visit (INDEPENDENT_AMBULATORY_CARE_PROVIDER_SITE_OTHER): Payer: Medicare Other | Admitting: Family Medicine

## 2014-05-23 ENCOUNTER — Encounter: Payer: Self-pay | Admitting: Family Medicine

## 2014-05-23 VITALS — BP 135/72 | HR 69 | Temp 97.9°F | Ht 66.0 in | Wt 116.0 lb

## 2014-05-23 DIAGNOSIS — G5792 Unspecified mononeuropathy of left lower limb: Secondary | ICD-10-CM

## 2014-05-23 DIAGNOSIS — E559 Vitamin D deficiency, unspecified: Secondary | ICD-10-CM

## 2014-05-23 DIAGNOSIS — D649 Anemia, unspecified: Secondary | ICD-10-CM

## 2014-05-23 DIAGNOSIS — E785 Hyperlipidemia, unspecified: Secondary | ICD-10-CM

## 2014-05-23 DIAGNOSIS — M47812 Spondylosis without myelopathy or radiculopathy, cervical region: Secondary | ICD-10-CM

## 2014-05-23 DIAGNOSIS — M4692 Unspecified inflammatory spondylopathy, cervical region: Secondary | ICD-10-CM | POA: Diagnosis not present

## 2014-05-23 NOTE — Progress Notes (Signed)
Subjective:    Patient ID: Kayla Randolph, female    DOB: January 16, 1946, 69 y.o.   MRN: [id number]  HPI Pt here for follow up and management of chronic medical problems which include hyperlipidemia, anemia, and depression. She does complain of some pain and arthralgias in her left foot. She says her neck pain does not bother her too much unless she lays on her right side. She only takes the meloxicam rarely. There is tingling and numbness in her toes especially with certain shoes and walking barefooted. This is been going on for a short while. She also complains of some discomfort in the plantar surface of her foot. She feels that she does not want an x-ray at this time but will get one in the future if problems continue. She has a history of cervical spine arthritis. She has questions about continuing her Estrace. She wasn't quite sure why she was taking this. I explained to her that it helps the more sure and especially with sexual relations. She also uses Replens. After discussion with this she will continue to use it are gradually cut back and supplement with Replens. She understands that it is important to get a pelvic exam and Pap smear because of using Estrace. Her recent lab work will be reviewed with her today. She has controlled her cholesterol in the past with therapeutic lifestyle changes and the numbers are up slightly from the past. She will continue to help control her cholesterol with aggressive therapeutic lifestyle changes We will reemphasize aggressive treatment with diet and exercise. Her CBC vitamin D liver function test and electrolytes including blood sugar are all good. This will be discussed with her during the visit today.        Patient Active Problem List   Diagnosis Date Noted  . Depression 04/03/2013  . Hyperlipemia 12/14/2012  . Vitamin D deficiency 12/14/2012  . Allergic rhinitis 12/14/2012  . 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 Upper extremity neuropathy  12/14/2012  . Osteopenia    Outpatient Encounter Prescriptions as of 05/23/2014  Medication Sig  . aspirin 81 MG tablet Take 81 mg by mouth daily.    . Cholecalciferol (VITAMIN D3) 1000 UNITS CAPS Take 1 capsule by mouth daily.    . cycloSPORINE (RESTASIS) 0.05 % ophthalmic emulsion Place 1 drop into both eyes 2 (two) times daily.  Marland Kitchen escitalopram (LEXAPRO) 10 MG tablet TAKE (1) TABLET DAILY AS DIRECTED.  Marland Kitchen estradiol (ESTRACE) 0.1 MG/GM vaginal cream Place 1 Applicatorful vaginally at bedtime. (Patient taking differently: Place 1 Applicatorful vaginally 3 (three) times a week. )  . fluticasone (FLONASE) 50 MCG/ACT nasal spray 1 SPRAY IN EACH NOSTRIL EVERY 12 HOURS  . meloxicam (MOBIC) 15 MG tablet One half to one pill daily as directed after eating  . Polyethylene Glycol 3350 (MIRALAX PO) Take 1 packet by mouth daily as needed.    Marland Kitchen Specialty Vitamins Products (ONE-A-DAY BONE STRENGTH) 500-28-100 MG-MG-UNIT TABS Take 1 tablet by mouth 3 (three) times daily.     Review of Systems  Constitutional: Negative.   HENT: Negative.   Eyes: Negative.   Respiratory: Negative.   Cardiovascular: Negative.   Gastrointestinal: Negative.   Endocrine: Negative.   Genitourinary: Negative.   Musculoskeletal: Positive for arthralgias (some left foot and toe pain and numbness).  Skin: Negative.   Allergic/Immunologic: Negative.   Neurological: Negative.   Hematological: Negative.   Psychiatric/Behavioral: Negative.        Objective:   Physical Exam  Constitutional: She  is oriented to person, place, and time. She appears well-developed and well-nourished. No distress.  HENT:  Head: Normocephalic and atraumatic.  Right Ear: External ear normal.  Left Ear: External ear normal.  Mouth/Throat: Oropharynx is clear and moist.  Nasal irritation right nostril with some hemorrhage on the nasal septum.  Eyes: Conjunctivae and EOM are normal. Pupils are equal, round, and reactive to light. Right eye exhibits  no discharge. Left eye exhibits no discharge. No scleral icterus.  Neck: Normal range of motion. Neck supple. No JVD present. No thyromegaly present.  No carotid bruits or adenopathy  Cardiovascular: Normal rate, regular rhythm, normal heart sounds and intact distal pulses.  Exam reveals no gallop and no friction rub.   No murmur heard. At 60/m  Pulmonary/Chest: Effort normal and breath sounds normal. No respiratory distress. She has no wheezes. She has no rales. She exhibits no tenderness.  Clear anteriorly and posteriorly  Abdominal: Soft. Bowel sounds are normal. She exhibits no mass. There is no tenderness. There is no rebound and no guarding.  No abdominal bruits or organ enlargement  Musculoskeletal: Normal range of motion. She exhibits no edema or tenderness.  Lymphadenopathy:    She has no cervical adenopathy.  Neurological: She is alert and oriented to person, place, and time. She has normal reflexes. No cranial nerve deficit.  Reflexes in both upper and lower extremities were intact  Skin: Skin is warm and dry. No rash noted.  Psychiatric: She has a normal mood and affect. Her behavior is normal. Judgment and thought content normal.  Nursing note and vitals reviewed.  BP 135/72 mmHg  Pulse 69  Temp(Src) 97.9 F (36.6 C) (Oral)  Ht 5\' 6"  (1.676 m)  Wt 116 lb (52.617 kg)  BMI 18.73 kg/m2        Assessment & Plan:  1. Hyperlipemia -Continue with aggressive therapeutic lifestyle changes  2. Vitamin D deficiency -Continue with vitamin D as currently doing  3. Anemia, unspecified anemia type -No change in treatment  4. Cervical spine arthritis -Continue meloxicam as needed  5. Neuropathy of left foot -Continue meloxicam as needed  No orders of the defined types were placed in this encounter.   Patient Instructions                       Medicare Annual Wellness Visit  [facility] and the medical providers at [facility] strive to bring you  the best medical care.  In doing so we not only want to address your current medical conditions and concerns but also to detect new conditions early and prevent illness, disease and health-related problems.    Medicare offers a yearly Wellness Visit which allows our clinical staff to assess your need for preventative services including immunizations, lifestyle education, counseling to decrease risk of preventable diseases and screening for fall risk and other medical concerns.    This visit is provided free of charge (no copay) for all Medicare recipients. The clinical pharmacists at [facility] have begun to conduct these Wellness Visits which will also include a thorough review of all your medications.    As you primary medical provider recommend that you make an appointment for your Annual Wellness Visit if you have not done so already this year.  You may set up this appointment before you leave today or you may call back ([phone]) and schedule an appointment.  Please make sure when you call that you mention that you  are scheduling your Annual Wellness Visit with the clinical pharmacist so that the appointment may be made for the proper length of time.     Continue current medications. Continue good therapeutic lifestyle changes which include good diet and exercise. Fall precautions discussed with patient. If an FOBT was given today- please return it to our front desk. If you are over 42 years old - you may need Prevnar 34 or the adult Pneumonia vaccine.  Flu Shots will be available at our office starting mid- September. Please call and schedule a FLU CLINIC APPOINTMENT.   This winter, use saline nose spray and saline gel Drink plenty of fluids If problems continue with foot pain and especially with tingling in the last 2 to the left foot please call and we will arrange for you to have LS spine films. You can certainly try the meloxicam one whole are one half by mouth  daily for a few days if this problem persists. Also try to decrease the use of the Estrace. If you become too dry and intercourse is more painful you may want to increase the dose back again. Continue to walk and exercise regularly   Arrie Senate MD

## 2014-05-23 NOTE — Patient Instructions (Addendum)
Medicare Annual Wellness Visit  Kayla Randolph and the medical providers at [facility] strive to bring you the best medical care.  In doing so we not only want to address your current medical conditions and concerns but also to detect new conditions early and prevent illness, disease and health-related problems.    Medicare offers a yearly Wellness Visit which allows our clinical staff to assess your need for preventative services including immunizations, lifestyle education, counseling to decrease risk of preventable diseases and screening for fall risk and other medical concerns.    This visit is provided free of charge (no copay) for all Medicare recipients. The clinical pharmacists at [facility] have begun to conduct these Wellness Visits which will also include a thorough review of all your medications.    As you primary medical provider recommend that you make an appointment for your Annual Wellness Visit if you have not done so already this year.  You may set up this appointment before you leave today or you may call back ([phone]) and schedule an appointment.  Please make sure when you call that you mention that you are scheduling your Annual Wellness Visit with the clinical pharmacist so that the appointment may be made for the proper length of time.     Continue current medications. Continue good therapeutic lifestyle changes which include good diet and exercise. Fall precautions discussed with patient. If an FOBT was given today- please return it to our front desk. If you are over 16 years old - you may need Prevnar 35 or the adult Pneumonia vaccine.  Flu Shots will be available at our office starting mid- September. Please call and schedule a FLU CLINIC APPOINTMENT.   This winter, use saline nose spray and saline gel Drink plenty of fluids If problems continue with foot pain and especially with tingling in the last 2  to the left foot please call and we will arrange for you to have LS spine films. You can certainly try the meloxicam one whole are one half by mouth daily for a few days if this problem persists. Also try to decrease the use of the Estrace. If you become too dry and intercourse is more painful you may want to increase the dose back again. Continue to walk and exercise regularly

## 2014-07-26 ENCOUNTER — Other Ambulatory Visit: Payer: Self-pay | Admitting: Nurse Practitioner

## 2014-08-09 ENCOUNTER — Other Ambulatory Visit: Payer: Self-pay | Admitting: Family Medicine

## 2014-09-09 ENCOUNTER — Encounter: Payer: Self-pay | Admitting: Family Medicine

## 2014-09-09 ENCOUNTER — Ambulatory Visit (INDEPENDENT_AMBULATORY_CARE_PROVIDER_SITE_OTHER): Payer: Medicare Other | Admitting: Family Medicine

## 2014-09-09 VITALS — BP 100/61 | HR 77 | Temp 99.2°F | Ht 66.0 in | Wt 117.0 lb

## 2014-09-09 DIAGNOSIS — J069 Acute upper respiratory infection, unspecified: Secondary | ICD-10-CM

## 2014-09-09 DIAGNOSIS — J029 Acute pharyngitis, unspecified: Secondary | ICD-10-CM

## 2014-09-09 DIAGNOSIS — R52 Pain, unspecified: Secondary | ICD-10-CM | POA: Diagnosis not present

## 2014-09-09 LAB — POCT RAPID STREP A (OFFICE): Rapid Strep A Screen: NEGATIVE

## 2014-09-09 LAB — POCT INFLUENZA A/B
Influenza A, POC: NEGATIVE
Influenza B, POC: NEGATIVE

## 2014-09-09 MED ORDER — AMOXICILLIN 250 MG PO CAPS: ORAL_CAPSULE | ORAL | Status: DC

## 2014-09-09 NOTE — Patient Instructions (Signed)
The patient should drink plenty of fluids She should use nasal saline frequently during the day She should use Mucinex, maximum strength, blue and white in color, 1 twice daily with a large glass of water for cough and congestion She should take antibiotic as directed

## 2014-09-09 NOTE — Progress Notes (Signed)
Subjective:    Patient ID: Kayla Randolph, female    DOB: [birth year], 69 y.o.   MRN: [id number]  HPI Patient here today for sore throat, cough and sinus trouble that started about 4 days ago.      Patient Active Problem List   Diagnosis Date Noted  . Depression 04/03/2013  . Hyperlipemia 12/14/2012  . Vitamin D deficiency 12/14/2012  . Allergic rhinitis 12/14/2012  . 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 Osteopenia    Outpatient Encounter Prescriptions as of 09/09/2014  Medication Sig  . aspirin 81 MG tablet Take 81 mg by mouth daily.    . Cholecalciferol (VITAMIN D3) 1000 UNITS CAPS Take 1 capsule by mouth daily.    . cycloSPORINE (RESTASIS) 0.05 % ophthalmic emulsion Place 1 drop into both eyes 2 (two) times daily.  Marland Kitchen escitalopram (LEXAPRO) 10 MG tablet TAKE (1) TABLET DAILY AS DIRECTED.  Marland Kitchen ESTRACE VAGINAL 0.1 MG/GM vaginal cream Place 1 Applicatorful vaginally at bedtime.  . fluticasone (FLONASE) 50 MCG/ACT nasal spray 1 SPRAY IN EACH NOSTRIL EVERY 12 HOURS  . Polyethylene Glycol 3350 (MIRALAX PO) Take 1 packet by mouth daily as needed.    Marland Kitchen Specialty Vitamins Products (ONE-A-DAY BONE STRENGTH) 500-28-100 MG-MG-UNIT TABS Take 1 tablet by mouth 3 (three) times daily.  . meloxicam (MOBIC) 15 MG tablet One half to one pill daily as directed after eating (Patient not taking: Reported on 09/09/2014)     Review of Systems  Constitutional: Positive for fever.  HENT: Positive for congestion, postnasal drip, sinus pressure and sore throat.   Eyes: Negative.   Respiratory: Positive for cough.   Cardiovascular: Negative.   Gastrointestinal: Negative.   Endocrine: Negative.   Genitourinary: Negative.   Musculoskeletal: Positive for myalgias.  Skin: Negative.   Allergic/Immunologic: Negative.   Neurological: Positive for headaches.  Hematological: Negative.   Psychiatric/Behavioral: Negative.        Objective:   Physical Exam    Constitutional: She is oriented to person, place, and time. She appears well-developed and well-nourished. No distress.  HENT:  Right Ear: External ear normal.  Left Ear: External ear normal.  Mouth/Throat: Oropharynx is clear and moist.  Nasal congestion bilaterally  Eyes: Conjunctivae and EOM are normal. Pupils are equal, round, and reactive to light. Right eye exhibits no discharge. Left eye exhibits no discharge. No scleral icterus.  Neck: Normal range of motion. Neck supple. No thyromegaly present.  No anterior cervical adenopathy  Cardiovascular: Normal rate, regular rhythm and normal heart sounds.   No murmur heard. Pulmonary/Chest: Effort normal and breath sounds normal. No respiratory distress. She has no wheezes. She has no rales. She exhibits no tenderness.  Dry cough  Abdominal: She exhibits no mass.  Musculoskeletal: Normal range of motion. She exhibits no edema.  Lymphadenopathy:    She has no cervical adenopathy.  Neurological: She is alert and oriented to person, place, and time. She has normal reflexes.  Skin: Skin is warm and dry. No rash noted.  Psychiatric: She has a normal mood and affect. Her behavior is normal. Judgment and thought content normal.  Nursing note and vitals reviewed.  BP 100/61 mmHg  Pulse 77  Temp(Src) 99.2 F (37.3 C) (Oral)  Ht 5\' 6"  (1.676 m)  Wt 117 lb (53.071 kg)  BMI 18.89 kg/m2 Results for orders placed or performed in visit on 09/09/14  POCT Influenza A/B  Result Value Ref Range   Influenza A, POC Negative  Influenza B, POC Negative   POCT rapid strep A  Result Value Ref Range   Rapid Strep A Screen Negative Negative          Assessment & Plan:  1. Sore throat -Throat culture is pending but take antibiotic as directed - POCT Influenza A/B - POCT rapid strep A - Culture, Group A Strep  2. Body aches -Take Tylenol for aches pains and fever - POCT Influenza A/B  3. URI (upper respiratory infection) -Use Mucinex  maximum strength and nasal saline as directed - amoxicillin (AMOXIL) 250 MG capsule; One 4 times daily for infection until completed  Dispense: 40 capsule; Refill: 0   Patient Instructions  The patient should drink plenty of fluids She should use nasal saline frequently during the day She should use Mucinex, maximum strength, blue and white in color, 1 twice daily with a large glass of water for cough and congestion She should take antibiotic as directed   Arrie Senate MD

## 2014-09-11 LAB — CULTURE, GROUP A STREP: Strep A Culture: NEGATIVE

## 2014-10-19 ENCOUNTER — Other Ambulatory Visit: Payer: Self-pay | Admitting: Family Medicine

## 2014-11-20 ENCOUNTER — Telehealth: Payer: Self-pay | Admitting: Family Medicine

## 2014-11-21 ENCOUNTER — Other Ambulatory Visit (INDEPENDENT_AMBULATORY_CARE_PROVIDER_SITE_OTHER): Payer: Medicare Other

## 2014-11-21 DIAGNOSIS — E559 Vitamin D deficiency, unspecified: Secondary | ICD-10-CM

## 2014-11-21 DIAGNOSIS — D649 Anemia, unspecified: Secondary | ICD-10-CM | POA: Diagnosis not present

## 2014-11-21 DIAGNOSIS — E785 Hyperlipidemia, unspecified: Secondary | ICD-10-CM

## 2014-11-21 LAB — POCT CBC
Granulocyte percent: 69.6 %G (ref 37–80)
HCT, POC: 44.8 % (ref 37.7–47.9)
Hemoglobin: 14.6 g/dL (ref 12.2–16.2)
Lymph, poc: 1.2 (ref 0.6–3.4)
MCH, POC: 29.3 pg (ref 27–31.2)
MCHC: 32.5 g/dL (ref 31.8–35.4)
MCV: 90.2 fL (ref 80–97)
MPV: 8.8 fL (ref 0–99.8)
POC Granulocyte: 3.3 (ref 2–6.9)
POC LYMPH PERCENT: 24.3 %L (ref 10–50)
Platelet Count, POC: 170 10*3/uL (ref 142–424)
RBC: 4.97 M/uL (ref 4.04–5.48)
RDW, POC: 14.9 %
WBC: 4.8 10*3/uL (ref 4.6–10.2)

## 2014-11-21 NOTE — Progress Notes (Signed)
Lab only 

## 2014-11-22 LAB — BMP8+EGFR
BUN/Creatinine Ratio: 22 (ref 11–26)
BUN: 16 mg/dL (ref 8–27)
CO2: 25 mmol/L (ref 18–29)
Calcium: 9.3 mg/dL (ref 8.7–10.3)
Chloride: 102 mmol/L (ref 97–108)
Creatinine, Ser: 0.74 mg/dL (ref 0.57–1.00)
GFR calc Af Amer: 95 mL/min/{1.73_m2} (ref >59)
GFR calc non Af Amer: 82 mL/min/{1.73_m2} (ref >59)
Glucose: 84 mg/dL (ref 65–99)
Potassium: 4.2 mmol/L (ref 3.5–5.2)
Sodium: 145 mmol/L — ABNORMAL HIGH (ref 134–144)

## 2014-11-22 LAB — HEPATIC FUNCTION PANEL
ALT: 17 IU/L (ref 0–32)
AST: 29 IU/L (ref 0–40)
Albumin: 4.4 g/dL (ref 3.5–4.8)
Alkaline Phosphatase: 47 IU/L (ref 39–117)
Bilirubin Total: 0.5 mg/dL (ref 0.0–1.2)
Bilirubin, Direct: 0.14 mg/dL (ref 0.00–0.40)
Total Protein: 6.4 g/dL (ref 6.0–8.5)

## 2014-11-22 LAB — NMR, LIPOPROFILE
Cholesterol: 196 mg/dL (ref 100–199)
HDL Cholesterol by NMR: 85 mg/dL (ref >39)
HDL Particle Number: 42.9 umol/L (ref >=30.5)
LDL Particle Number: 1153 nmol/L — ABNORMAL HIGH (ref <1000)
LDL Size: 21.8 nm (ref >20.5)
LDL-C: 93 mg/dL (ref 0–99)
LP-IR Score: <25 (ref <=45)
Small LDL Particle Number: <90 nmol/L (ref <=527)
Triglycerides by NMR: 91 mg/dL (ref 0–149)

## 2014-11-22 LAB — VITAMIN D 25 HYDROXY (VIT D DEFICIENCY, FRACTURES): Vit D, 25-Hydroxy: 77.6 ng/mL (ref 30.0–100.0)

## 2014-11-25 ENCOUNTER — Ambulatory Visit (INDEPENDENT_AMBULATORY_CARE_PROVIDER_SITE_OTHER): Payer: Medicare Other | Admitting: Family Medicine

## 2014-11-25 ENCOUNTER — Encounter: Payer: Self-pay | Admitting: Family Medicine

## 2014-11-25 VITALS — BP 126/72 | HR 65 | Temp 98.1°F | Ht 66.0 in | Wt 117.0 lb

## 2014-11-25 DIAGNOSIS — M25562 Pain in left knee: Secondary | ICD-10-CM | POA: Diagnosis not present

## 2014-11-25 DIAGNOSIS — M4692 Unspecified inflammatory spondylopathy, cervical region: Secondary | ICD-10-CM

## 2014-11-25 DIAGNOSIS — E785 Hyperlipidemia, unspecified: Secondary | ICD-10-CM

## 2014-11-25 DIAGNOSIS — J301 Allergic rhinitis due to pollen: Secondary | ICD-10-CM | POA: Diagnosis not present

## 2014-11-25 DIAGNOSIS — Z1231 Encounter for screening mammogram for malignant neoplasm of breast: Secondary | ICD-10-CM | POA: Diagnosis not present

## 2014-11-25 DIAGNOSIS — M47812 Spondylosis without myelopathy or radiculopathy, cervical region: Secondary | ICD-10-CM

## 2014-11-25 NOTE — Progress Notes (Signed)
Subjective:    Patient ID: Kayla Randolph, female    DOB: 08/23/1945, 69 y.o.   MRN: [id number]  HPI   69 year old female comes in today for 6 month follow up on chronic medical conditions which include hyperlipidemia, depression, arthritis, and osteopenia. Patient continues to have problems with nasal congestion and a slight cough. She also continues to complain with some left knee pain since an injury on July 4. She also complains of some foot pain and thinks this maybe plantar fasciitis. Recent lab work done on this patient will be reviewed with her during the visit today. Her CBC was within normal limits. Her liver function test and kidney function tests were good except the serum sodium was slightly elevated. The cholesterol numbers are more elevated this time than in the past. The vitamin D level was excellent.  Patient Active Problem List   Diagnosis Date Noted  . Depression 04/03/2013  . Hyperlipemia 12/14/2012  . Vitamin D deficiency 12/14/2012  . Allergic rhinitis 12/14/2012  . 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 Osteopenia    Outpatient Encounter Prescriptions as of 11/25/2014  Medication Sig  . aspirin 81 MG tablet Take 81 mg by mouth daily.    . Cholecalciferol (VITAMIN D3) 1000 UNITS CAPS Take 1 capsule by mouth daily.    . cycloSPORINE (RESTASIS) 0.05 % ophthalmic emulsion Place 1 drop into both eyes 2 (two) times daily.  Marland Kitchen escitalopram (LEXAPRO) 10 MG tablet TAKE (1) TABLET DAILY AS DIRECTED.  Marland Kitchen ESTRACE VAGINAL 0.1 MG/GM vaginal cream Place 1 Applicatorful vaginally at bedtime.  . fluticasone (FLONASE) 50 MCG/ACT nasal spray 1 SPRAY IN EACH NOSTRIL EVERY 12 HOURS  . Polyethylene Glycol 3350 (MIRALAX PO) Take 1 packet by mouth daily as needed.    Marland Kitchen Specialty Vitamins Products (ONE-A-DAY BONE STRENGTH) 500-28-100 MG-MG-UNIT TABS Take 1 tablet by mouth 3 (three) times daily.  . [DISCONTINUED] amoxicillin (AMOXIL) 250 MG capsule One  4 times daily for infection until completed  . [DISCONTINUED] meloxicam (MOBIC) 15 MG tablet One half to one pill daily as directed after eating (Patient not taking: Reported on 09/09/2014)   No facility-administered encounter medications on file as of 11/25/2014.      Review of Systems  Constitutional: Negative.   HENT: Positive for postnasal drip.   Eyes: Negative.   Respiratory: Positive for cough (due to PND).   Cardiovascular: Negative.   Gastrointestinal: Negative.   Endocrine: Negative.   Genitourinary: Negative.   Musculoskeletal: Positive for arthralgias (L knee pain s/p injury on 11/18/14. She applied ice and elevated it which has helped some. ), neck pain and neck stiffness. Myalgias: arthritis.       Left foot pain. Believes it is plantar fasciitis  Skin: Negative.   Allergic/Immunologic: Negative.   Neurological: Positive for headaches (frontal).  Hematological: Negative.   Psychiatric/Behavioral: Negative.        Objective:   Physical Exam  Constitutional: She is oriented to person, place, and time. She appears well-developed and well-nourished. No distress.  HENT:  Head: Normocephalic and atraumatic.  Right Ear: External ear normal.  Left Ear: External ear normal.  Mouth/Throat: Oropharynx is clear and moist.  There is some nasal congestion bilaterally  Eyes: Conjunctivae and EOM are normal. Pupils are equal, round, and reactive to light. Right eye exhibits no discharge. Left eye exhibits no discharge. No scleral icterus.  Neck: Normal range of motion. Neck supple. No thyromegaly present.  Without bruits  or thyromegaly  Cardiovascular: Normal rate, regular rhythm and intact distal pulses.  Exam reveals no friction rub.   No murmur heard. The rhythm is regular at 72/m  Pulmonary/Chest: Effort normal and breath sounds normal. No respiratory distress. She has no wheezes. She has no rales. She exhibits no tenderness.  Clear anteriorly and posteriorly  Abdominal:  Soft. Bowel sounds are normal. She exhibits no mass. There is no tenderness. There is no rebound and no guarding.  Nontender without organ enlargement or masses    Musculoskeletal: Normal range of motion. She exhibits no edema or tenderness.  There was minimal pain with flexing and extending the knee and no joint line tenderness with palpation with flexion and extension and internal and external rotation.  Lymphadenopathy:    She has no cervical adenopathy.  Neurological: She is alert and oriented to person, place, and time. She has normal reflexes. No cranial nerve deficit.  Skin: Skin is warm and dry. No rash noted.  Psychiatric: She has a normal mood and affect. Her behavior is normal. Judgment and thought content normal.  Nursing note and vitals reviewed.   BP 126/72 mmHg  Pulse 65  Temp(Src) 98.1 F (36.7 C) (Oral)  Ht 5\' 6"  (1.676 m)  Wt 117 lb (53.071 kg)  BMI 18.89 kg/m2        Assessment & Plan:  1. Allergic rhinitis due to pollen -And 10 you with Flonase regularly and use nasal saline during the day  2. Cervical spine arthritis -The patient has had problems with this off and on in the past. She should take ibuprofen twice daily after breakfast and supper for a few days to help relieve the pain and discomfort and then discontinue it  3. Hyperlipidemia -The patient admits to less exercise and eating M&M's. She is going to try to change her diet habits and increase her physical activity and she would like to wait until the next visit to follow-up on the cholesterol before anything aggressively is done.  4. Left knee pain -She will take ibuprofen twice daily after breakfast and supper for the next week and see if this helps relieve the discomfort along with using some warm wet compresses.  Patient Instructions   Continue current medications. Continue good therapeutic lifestyle changes which include good diet and exercise. Fall precautions discussed with patient. If an  FOBT was given today- please return it to our front desk. If you are over 50 years old - you may need Prevnar 59 or the adult Pneumonia vaccine.   After your visit with [place] today you will receive a survey in the mail or online from [employer] regarding your care with [place]. Please take a moment to fill this out. Your feedback is very important to [place] as you can help [place] better understand your patient needs as well as improve your experience and satisfaction. WE CARE ABOUT YOU!!!                        Medicare Annual Wellness Visit  [facility] and the medical providers at [facility] strive to bring you the best medical care.  In doing so we not only want to address your current medical conditions and concerns but also to detect new conditions early and prevent illness, disease and health-related problems.    Medicare offers a yearly Wellness Visit which allows our clinical staff to assess your need for preventative services including immunizations, lifestyle education, counseling to  decrease risk of preventable diseases and screening for fall risk and other medical concerns.    This visit is provided free of charge (no copay) for all Medicare recipients. The clinical pharmacists at [facility] have begun to conduct these Wellness Visits which will also include a thorough review of all your medications.    As you primary medical provider recommend that you make an appointment for your Annual Wellness Visit if you have not done so already this year.  You may set up this appointment before you leave today or you may call back ([phone]) and schedule an appointment.  Please make sure when you call that you mention that you are scheduling your Annual Wellness Visit with the clinical pharmacist so that the appointment may be made for the proper length of time.    Take ibuprofen as directed and if pain does not get better return to clinic for x-ray of the  knee.   Arrie Senate MD

## 2014-11-25 NOTE — Patient Instructions (Addendum)
  Continue current medications. Continue good therapeutic lifestyle changes which include good diet and exercise. Fall precautions discussed with patient. If an FOBT was given today- please return it to our front desk. If you are over 70 years old - you may need Prevnar 49 or the adult Pneumonia vaccine.   After your visit with [place] today you will receive a survey in the mail or online from [employer] regarding your care with [place]. Please take a moment to fill this out. Your feedback is very important to [place] as you can help [place] better understand your patient needs as well as improve your experience and satisfaction. WE CARE ABOUT YOU!!!                        Medicare Annual Wellness Visit  [facility] and the medical providers at [facility] strive to bring you the best medical care.  In doing so we not only want to address your current medical conditions and concerns but also to detect new conditions early and prevent illness, disease and health-related problems.    Medicare offers a yearly Wellness Visit which allows our clinical staff to assess your need for preventative services including immunizations, lifestyle education, counseling to decrease risk of preventable diseases and screening for fall risk and other medical concerns.    This visit is provided free of charge (no copay) for all Medicare recipients. The clinical pharmacists at [facility] have begun to conduct these Wellness Visits which will also include a thorough review of all your medications.    As you primary medical provider recommend that you make an appointment for your Annual Wellness Visit if you have not done so already this year.  You may set up this appointment before you leave today or you may call back ([phone]) and schedule an appointment.  Please make sure when you call that you mention that you are scheduling your Annual Wellness Visit with the clinical pharmacist so that  the appointment may be made for the proper length of time.    Take ibuprofen as directed and if pain does not get better return to clinic for x-ray of the knee.

## 2014-11-25 NOTE — Addendum Note (Signed)
Addended by: Ilean China on: 11/25/2014 12:54 PM   Modules accepted: Orders

## 2014-11-26 DIAGNOSIS — D239 Other benign neoplasm of skin, unspecified: Secondary | ICD-10-CM | POA: Diagnosis not present

## 2014-11-26 DIAGNOSIS — L603 Nail dystrophy: Secondary | ICD-10-CM | POA: Diagnosis not present

## 2014-12-02 ENCOUNTER — Ambulatory Visit (INDEPENDENT_AMBULATORY_CARE_PROVIDER_SITE_OTHER): Payer: Medicare Other | Admitting: Nurse Practitioner

## 2014-12-02 ENCOUNTER — Encounter: Payer: Self-pay | Admitting: Nurse Practitioner

## 2014-12-02 DIAGNOSIS — Z01419 Encounter for gynecological examination (general) (routine) without abnormal findings: Secondary | ICD-10-CM

## 2014-12-02 LAB — POCT URINALYSIS DIPSTICK
Bilirubin, UA: NEGATIVE
Blood, UA: NEGATIVE
Glucose, UA: NEGATIVE
Ketones, UA: NEGATIVE
Leukocytes, UA: NEGATIVE
Nitrite, UA: NEGATIVE
Protein, UA: NEGATIVE
Spec Grav, UA: <=1.005
Urobilinogen, UA: NEGATIVE
pH, UA: 8

## 2014-12-02 LAB — POCT UA - MICROSCOPIC ONLY
Bacteria, U Microscopic: NEGATIVE
Casts, Ur, LPF, POC: NEGATIVE
Crystals, Ur, HPF, POC: NEGATIVE
Mucus, UA: NEGATIVE
RBC, urine, microscopic: NEGATIVE
WBC, Ur, HPF, POC: NEGATIVE
Yeast, UA: NEGATIVE

## 2014-12-02 NOTE — Patient Instructions (Signed)
Pap Test Information  A Pap test is a medical test done to evaluate cells that are on the surface of the cervix. The cervix is the lower portion of the uterus and upper portion of the vagina. This test is done in a clinic office and can detect certain infections, abnormal cervical cells, or cervical cancer. For some women, the cervical region has the potential to form cancer. With consistent evaluations, cervical cancer is a rare and preventable kind of cancer.   DIFFERENT WAYS TO PERFORM A PAP TEST  There are 2 ways a Pap test may be done. The main difference between the 2 ways has to do with what instruments are used to get a cell sample and how the cells are tested. In both tests, a warm metal or plastic instrument (speculum) is placed in the vagina. The speculum allows the caregiver to see the inside of the vagina and to look at the cervix. The caregiver will use either a wooden spatula or plastic brush to collect cervical cells. If a wooden spatula was used, the cervical cells are placed on a glass slide and sent to the lab to be examined. If a plastic brush was used, the cervical cells are retrieved and both the plastic brush and cells are placed in a container with fluid, and the cells are examined. In addition, the fluid in the container can also be checked for human papillomavirus (HPV) and other infections, such as gonorrhea and chlamydia.  RISK FACTORS FOR CERVICAL CANCER   Risk factors for cervical cancer are things that increase your chance of getting cervical cancer. These things include:  · Having had an abnormal Pap test or cancer of the vagina or vulva and avoiding proper medical follow up and care.  · Becoming sexually active before age 18.  · Smoking.  · Having a weakened immune system. HIV or other immunodeficiency disorders weaken the immune system.    · Having had a sexually transmitted infection (STI), such as chlamydia, gonorrhea, or HPV.    · Having a sexual partner who has a history of  direct or indirect contact with cervical cancer.    · Not using condoms with new sexual partners.    · Being the daughter of a woman who took diethylstilbestrol (DES) during pregnancy.  HOW TO PREPARE FOR A PAP TEST  · Schedule your Pap test for a time when you are not likely to be on your period, or ask if it is okay to be on your period during the Pap test. Depending on the method of Pap test collection, your caregiver may not be able to perform the test if you are on your period or if your period is very heavy.  · Avoid douching before the Pap test. This may lessen the number of cells that are being evaluated.  · Refrain from intercourse the day before your Pap test, or as directed by your caregiver.  If you have never had a Pap test, ask your caregiver how it is done. He or she may take extra time to show and explain the speculum and collecting system used during the test.   HOW OFTEN SHOULD YOU HAVE PAP TESTS?  How often you should get a Pap test depends on your age and health history. Talk to your caregiver about how often you should get tested.   PAP RECOMMENDATIONS BASED ON AGE  The recommendations below are the same for women who have or have not gotten the vaccine for HPV. Some women may need   screenings more often if they are at high risk for cervical cancer.   · The first Pap test should be done at age 21.  · Between ages 21 and 29, have a Pap test every 2 or 3 years.  · If you are 70 years old and your last 3 Pap tests have been normal, Pap tests are needed no more often than every 3 years. This should be approved by your caregiver.  · If you are 65 years old or older, ask your caregiver if you can stop having Pap tests.  PAP TEST RECOMMENDATIONS BASED ON HEALTH HISTORY  Confirm the recommendations below with your caregiver. It is recommended that:  · You have your first Pap test before the age of 21 if you are sexually active or are immunocompromised (HIV, lupus, or taking transplant medicines).  · You  have more frequent Pap tests if you have a weakened immune system due to HIV, lupus, or you are taking transplant medicines; if your mother was exposed to DES while pregnant with you; or if you have cervical intraepithelial neoplasia (CIN) 2 or 3.  · You have a Pap test and screenings for cancer for at least 20 years after treatment for cervical cancer or a condition that could lead to cancer. Talk to your caregiver if a new problem develops soon after your last Pap test.  YOU DO NOT NEED REGULAR PAP TESTS IF:   · You had a hysterectomy for a problem that was not a cancer or a condition that could lead to cancer. However, even if you no longer need a Pap test, a regular exam is a good idea to make sure other serious problems are not starting.  · If you are between ages 65 and 70, and you have had normal Pap tests for 10 years, you no longer need Pap tests. However, even if you no longer need a Pap test, a regular exam is a good idea to make sure other serious problems are not starting.  If Pap tests have been discontinued, risk factors (such as a new sexual partner) need to be re-evaluated to determine if screening should be resumed.  RESULTS OF YOUR PAP TEST  A healthy Pap test shows no abnormal cells or evidence of infection. However, inflammation of the cervix (cervicitis) is a common Pap test result but is of no significance.   The presence of abnormally growing cells on the surface of the cervix may be reported as an abnormal Pap test. If a Pap test is abnormal, it is most often a result of a previous exposure to HPV. HPV may infect the cells of the cervix and cause dysplasia. Dysplasia is where the cells no longer look normal. If a person has been diagnosed with high-grade dysplasia or severe dysplasia, they are at higher risk of developing cervical cancer. A woman diagnosed with low-grade dysplasia should still be seen by her caregiver. Low-grade dysplasia still has a small risk of developing into cancer.  Your caregiver will determine what follow-up care is needed or when you should have your next Pap test.    If you have had an abnormal Pap test:    · You may be asked to have a colposcopy. This is a test in which the cervix is viewed with a lighted microscope.    · A cervical tissue sample (biopsy) may also be needed. This involves taking a small tissue sample from the cervix.  Document Released: 07/26/2011 Document Reviewed: 07/26/2011  [employer]® Patient Information ©2015 [employer].

## 2014-12-02 NOTE — Progress Notes (Addendum)
Subjective:    Patient ID: Kayla Randolph, female    DOB: Jan 24, 1946, 69 y.o.   MRN: [id number]  HPI Patient is regular patient of Dr. Laurance Flatten that was sent to me for PAP and breast exam. SHe is doing well today without complaints.     Review of Systems  Constitutional: Negative.   HENT: Negative.   Respiratory: Negative.   Cardiovascular: Negative.   Genitourinary: Negative.   All other systems reviewed and are negative.      Objective:   Physical Exam  Constitutional: She is oriented to person, place, and time. She appears well-developed and well-nourished.  HENT:  Head: Normocephalic.  Right Ear: Hearing, tympanic membrane, external ear and ear canal normal.  Left Ear: Hearing, tympanic membrane, external ear and ear canal normal.  Nose: Nose normal.  Mouth/Throat: Uvula is midline and oropharynx is clear and moist.  Eyes: Conjunctivae and EOM are normal. Pupils are equal, round, and reactive to light.  Neck: Normal range of motion and full passive range of motion without pain. Neck supple. No JVD present. Carotid bruit is not present. No thyroid mass and no thyromegaly present.  Cardiovascular: Normal rate, normal heart sounds and intact distal pulses.   No murmur heard. Pulmonary/Chest: Effort normal and breath sounds normal. Right breast exhibits no inverted nipple, no mass, no nipple discharge, no skin change and no tenderness. Left breast exhibits no inverted nipple, no mass, no nipple discharge, no skin change and no tenderness.  Abdominal: Soft. Bowel sounds are normal. She exhibits no mass. There is no tenderness.  Genitourinary: Vagina normal and uterus normal. No breast swelling, tenderness, discharge or bleeding.  bimanual exam-No adnexal masses or tenderness.   Musculoskeletal: Normal range of motion.  Lymphadenopathy:    She has no cervical adenopathy.  Neurological: She is alert and oriented to person, place, and time.  Skin: Skin is warm and dry.    Psychiatric: She has a normal mood and affect. Her behavior is normal. Judgment and thought content normal.   BP 125/75 mmHg  Pulse 71  Temp(Src) 97.5 F (36.4 C) (Oral)  Ht 5\' 6"  (1.676 m)  Wt 116 lb 12.8 oz (52.98 kg)  BMI 18.86 kg/m2  Results for orders placed or performed in visit on 12/02/14  POCT UA - Microscopic Only  Result Value Ref Range   WBC, Ur, HPF, POC neg    RBC, urine, microscopic neg    Bacteria, U Microscopic neg    Mucus, UA neg    Epithelial cells, urine per micros occ    Crystals, Ur, HPF, POC neg    Casts, Ur, LPF, POC neg    Yeast, UA neg   POCT urinalysis dipstick  Result Value Ref Range   Color, UA yellow    Clarity, UA clear    Glucose, UA neg    Bilirubin, UA neg    Ketones, UA neg    Spec Grav, UA <=1.005    Blood, UA neg    pH, UA 8.0    Protein, UA neg    Urobilinogen, UA negative    Nitrite, UA neg    Leukocytes, UA Negative Negative          Assessment & Plan:  1. Encounter for routine gynecological examination - POCT UA - Microscopic Only - POCT urinalysis dipstick - Pap IG (Image Guided)    Labs pending Health maintenance reviewed Diet and exercise encouraged Continue all meds Follow up  In 3 months with Dr.  Mayra Neer, FNP

## 2014-12-04 LAB — PAP IG (IMAGE GUIDED): PAP Smear Comment: 0

## 2014-12-13 ENCOUNTER — Other Ambulatory Visit: Payer: Medicare Other

## 2014-12-13 ENCOUNTER — Other Ambulatory Visit: Payer: Self-pay | Admitting: Family Medicine

## 2014-12-13 DIAGNOSIS — Z1212 Encounter for screening for malignant neoplasm of rectum: Secondary | ICD-10-CM | POA: Diagnosis not present

## 2014-12-13 NOTE — Progress Notes (Signed)
Lab only 

## 2014-12-15 LAB — FECAL OCCULT BLOOD, IMMUNOCHEMICAL: Fecal Occult Bld: NEGATIVE

## 2014-12-18 ENCOUNTER — Ambulatory Visit (INDEPENDENT_AMBULATORY_CARE_PROVIDER_SITE_OTHER): Payer: Medicare Other

## 2014-12-18 ENCOUNTER — Encounter: Payer: Self-pay | Admitting: Family Medicine

## 2014-12-18 ENCOUNTER — Ambulatory Visit (INDEPENDENT_AMBULATORY_CARE_PROVIDER_SITE_OTHER): Payer: Medicare Other | Admitting: Family Medicine

## 2014-12-18 VITALS — BP 145/76 | HR 59 | Temp 97.1°F | Ht 66.0 in | Wt 116.8 lb

## 2014-12-18 DIAGNOSIS — M7732 Calcaneal spur, left foot: Secondary | ICD-10-CM | POA: Diagnosis not present

## 2014-12-18 DIAGNOSIS — M79672 Pain in left foot: Secondary | ICD-10-CM | POA: Diagnosis not present

## 2014-12-18 DIAGNOSIS — M25572 Pain in left ankle and joints of left foot: Secondary | ICD-10-CM

## 2014-12-18 NOTE — Progress Notes (Signed)
   Subjective:    Patient ID: Kayla Randolph, female    DOB: 09/16/1945, 69 y.o.   MRN: [id number]  HPI   Left heel pain for several weeks. She does not recall any injury. She has been doing some stretching exercises.     Patient Active Problem List   Diagnosis Date Noted  . Depression 04/03/2013  . Hyperlipemia 12/14/2012  . Vitamin D deficiency 12/14/2012  . Allergic rhinitis 12/14/2012  . 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 Osteopenia    Outpatient Encounter Prescriptions as of 12/18/2014  Medication Sig  . aspirin 81 MG tablet Take 81 mg by mouth daily.    . Cholecalciferol (VITAMIN D3) 1000 UNITS CAPS Take 1 capsule by mouth daily.    . cycloSPORINE (RESTASIS) 0.05 % ophthalmic emulsion Place 1 drop into both eyes 2 (two) times daily.  Marland Kitchen escitalopram (LEXAPRO) 10 MG tablet TAKE (1) TABLET DAILY AS DIRECTED.  Marland Kitchen ESTRACE VAGINAL 0.1 MG/GM vaginal cream Place 1 Applicatorful vaginally at bedtime.  . fluticasone (FLONASE) 50 MCG/ACT nasal spray 1 SPRAY IN EACH NOSTRIL EVERY 12 HOURS  . Polyethylene Glycol 3350 (MIRALAX PO) Take 1 packet by mouth daily as needed.    Marland Kitchen Specialty Vitamins Products (ONE-A-DAY BONE STRENGTH) 500-28-100 MG-MG-UNIT TABS Take 1 tablet by mouth 3 (three) times daily.   No facility-administered encounter medications on file as of 12/18/2014.        Review of Systems  Constitutional: Negative.   HENT: Negative.   Eyes: Negative.   Respiratory: Negative.   Cardiovascular: Negative.   Gastrointestinal: Negative.   Endocrine: Negative.   Genitourinary: Negative.   Musculoskeletal: Arthralgias: left heel pain.  Skin: Negative.   Allergic/Immunologic: Negative.   Neurological: Negative.   Hematological: Negative.   Psychiatric/Behavioral: Negative.        Objective:   Physical Exam  Constitutional: She is oriented to person, place, and time. She appears well-developed and well-nourished. No distress.    Musculoskeletal: Normal range of motion. She exhibits tenderness. She exhibits no edema.  Is no swelling in the left heel but there is tenderness medially more than on the plantar surface.  Neurological: She is alert and oriented to person, place, and time.  Skin: Skin is warm and dry. No rash noted.  Psychiatric: She has a normal mood and affect. Her behavior is normal. Judgment normal.  Nursing note and vitals reviewed.   BP 145/76 mmHg  Pulse 59  Temp(Src) 97.1 F (36.2 C) (Oral)  Ht 5\' 6"  (1.676 m)  Wt 116 lb 12.8 oz (52.98 kg)  BMI 18.86 kg/m2  [facility] reading (PRIMARY) by  Dr.Moore-left heel--- small spur                                       Assessment & Plan:  1. Pain in joint, ankle and foot, left -Take Aleve as directed - DG Foot Complete Left; Future  2. Heel pain, left Warm soaks and heel cup and massage  3. Heel spur, left -Use heel cups as directed and take Aleve  Patient Instructions  Take Aleve, over-the-counter, 1 twice daily after breakfast and supper for 10-14 days. Continue to do stretching exercises. Wear good supportive shoes and use heel cups in both shoes Warm soaks Have husband massage the and heels!!!   Arrie Senate MD

## 2014-12-18 NOTE — Patient Instructions (Addendum)
Take Aleve, over-the-counter, 1 twice daily after breakfast and supper for 10-14 days. Continue to do stretching exercises. Wear good supportive shoes and use heel cups in both shoes Warm soaks Have husband massage the and heels!!!

## 2015-01-11 ENCOUNTER — Other Ambulatory Visit: Payer: Self-pay | Admitting: Family Medicine

## 2015-01-22 ENCOUNTER — Ambulatory Visit
Admission: RE | Admit: 2015-01-22 | Discharge: 2015-01-22 | Disposition: A | Discharge status: Home / Self Care | Payer: Medicare Other | Source: Ambulatory Visit | Attending: Family Medicine | Admitting: Family Medicine

## 2015-01-22 DIAGNOSIS — Z1231 Encounter for screening mammogram for malignant neoplasm of breast: Secondary | ICD-10-CM

## 2015-02-03 ENCOUNTER — Other Ambulatory Visit: Payer: Self-pay | Admitting: Nurse Practitioner

## 2015-02-04 ENCOUNTER — Ambulatory Visit (INDEPENDENT_AMBULATORY_CARE_PROVIDER_SITE_OTHER): Payer: Medicare Other | Admitting: Family

## 2015-02-04 ENCOUNTER — Encounter: Payer: Self-pay | Admitting: Family

## 2015-02-04 VITALS — BP 144/74 | Temp 97.5°F | Ht 66.0 in | Wt 116.0 lb

## 2015-02-04 DIAGNOSIS — M79672 Pain in left foot: Secondary | ICD-10-CM | POA: Diagnosis not present

## 2015-02-04 DIAGNOSIS — M722 Plantar fascial fibromatosis: Secondary | ICD-10-CM | POA: Diagnosis not present

## 2015-02-04 DIAGNOSIS — M7732 Calcaneal spur, left foot: Secondary | ICD-10-CM

## 2015-02-04 MED ORDER — MELOXICAM 7.5 MG PO TABS: 7.5000 mg | ORAL_TABLET | Freq: Every day | ORAL | Status: DC

## 2015-02-04 MED ORDER — METHYLPREDNISOLONE ACETATE 40 MG/ML IJ SUSP
40.0000 mg | Freq: Once | INTRAMUSCULAR | Status: AC
  Administered 2015-02-04: 40 mg via INTRA_ARTICULAR

## 2015-02-04 MED ORDER — BUPIVACAINE HCL 0.25 % IJ SOLN
1.0000 mL | Freq: Once | INTRAMUSCULAR | Status: AC
  Administered 2015-02-04: 1 mL via INTRA_ARTICULAR

## 2015-02-04 NOTE — Patient Instructions (Signed)
Plantar Fasciitis  Plantar fasciitis is a common condition that causes foot pain. It is soreness (inflammation) of the band of tough fibrous tissue on the bottom of the foot that runs from the heel bone (calcaneus) to the ball of the foot. The cause of this soreness may be from excessive standing, poor fitting shoes, running on hard surfaces, being overweight, having an abnormal walk, or overuse (this is common in runners) of the painful foot or feet. It is also common in aerobic exercise dancers and ballet dancers.  SYMPTOMS   Most people with plantar fasciitis complain of:   Severe pain in the morning on the bottom of their foot especially when taking the first steps out of bed. This pain recedes after a few minutes of walking.   Severe pain is experienced also during walking following a long period of inactivity.   Pain is worse when walking barefoot or up stairs  DIAGNOSIS    Your caregiver will diagnose this condition by examining and feeling your foot.   Special tests such as X-rays of your foot, are usually not needed.  PREVENTION    Consult a sports medicine professional before beginning a new exercise program.   Walking programs offer a good workout. With walking there is a lower chance of overuse injuries common to runners. There is less impact and less jarring of the joints.   Begin all new exercise programs slowly. If problems or pain develop, decrease the amount of time or distance until you are at a comfortable level.   Wear good shoes and replace them regularly.   Stretch your foot and the heel cords at the back of the ankle (Achilles tendon) both before and after exercise.   Run or exercise on even surfaces that are not hard. For example, asphalt is better than pavement.   Do not run barefoot on hard surfaces.   If using a treadmill, vary the incline.   Do not continue to workout if you have foot or joint problems. Seek professional help if they do not improve.  HOME CARE INSTRUCTIONS     Avoid activities that cause you pain until you recover.   Use ice or cold packs on the problem or painful areas after working out.   Only take over-the-counter or prescription medicines for pain, discomfort, or fever as directed by your caregiver.   Soft shoe inserts or athletic shoes with air or gel sole cushions may be helpful.   If problems continue or become more severe, consult a sports medicine caregiver or your own health care Kayla Randolph. Cortisone is a potent anti-inflammatory medication that may be injected into the painful area. You can discuss this treatment with your caregiver.  MAKE SURE YOU:    Understand these instructions.   Will watch your condition.   Will get help right away if you are not doing well or get worse.  Document Released: 01/26/2001 Document Revised: 07/26/2011 Document Reviewed: 03/27/2008  ExitCare Patient Information 2015 [employer]. This information is not intended to replace advice given to you by your health care Atsushi Yom. Make sure you discuss any questions you have with your health care Taavi Hoose.

## 2015-02-04 NOTE — Progress Notes (Signed)
   Subjective:    Patient ID: Kayla Randolph, female    DOB: Dec 28, 1945, 69 y.o.   MRN: [id number]  Pt states she had a X-ray 12/18/14 which showed plantar bone spurs.  Foot Pain This is a new problem. The current episode started 1 to 4 weeks ago. The problem occurs constantly. The problem has been unchanged. Pertinent negatives include no chest pain, congestion or headaches. The symptoms are aggravated by walking and standing. She has tried acetaminophen and NSAIDs for the symptoms. The treatment provided mild relief.      Review of Systems  Constitutional: Negative.   HENT: Negative.  Negative for congestion.   Eyes: Negative.   Respiratory: Negative.  Negative for shortness of breath.   Cardiovascular: Negative.  Negative for chest pain and palpitations.  Gastrointestinal: Negative.   Endocrine: Negative.   Genitourinary: Negative.   Musculoskeletal: Negative.   Neurological: Negative.  Negative for headaches.  Hematological: Negative.   Psychiatric/Behavioral: Negative.   All other systems reviewed and are negative.      Objective:   Physical Exam  Constitutional: She is oriented to person, place, and time. She appears well-developed and well-nourished. No distress.  HENT:  Head: Normocephalic and atraumatic.  Right Ear: External ear normal.  Mouth/Throat: Oropharynx is clear and moist.  Eyes: Pupils are equal, round, and reactive to light.  Neck: Normal range of motion. Neck supple. No thyromegaly present.  Cardiovascular: Normal rate, regular rhythm, normal heart sounds and intact distal pulses.   No murmur heard. Pulmonary/Chest: Effort normal and breath sounds normal. No respiratory distress. She has no wheezes.  Abdominal: Soft. Bowel sounds are normal. She exhibits no distension. There is no tenderness.  Musculoskeletal: Normal range of motion. She exhibits no edema or tenderness.  Neurological: She is alert and oriented to person, place, and time. She has normal  reflexes. No cranial nerve deficit.  Skin: Skin is warm and dry.  Psychiatric: She has a normal mood and affect. Her behavior is normal. Judgment and thought content normal.  Vitals reviewed.   BP 144/74 mmHg  Temp(Src) 97.5 F (36.4 C) (Oral)  Ht 5\' 6"  (1.676 m)  Wt 116 lb (52.617 kg)  BMI 18.73 kg/m2  Procedure Note: Right lateral heel area cleaned with chlorhexidine Marcaine and depo medrol solution injected into heel Risks of bleeding and s/s of infection discussed     Assessment & Plan:  1. Plantar fasciitis of left foot -Freeze bottle of water and roll from heel to toe -No other NSAID's while taking Mobic for next 7-10 days -Get good shoes with good support  - methylPREDNISolone acetate (DEPO-MEDROL) injection 40 mg; Inject 1 mL (40 mg total) into the articular space once. - bupivacaine (MARCAINE) 0.25 % (with pres) injection 1 mL; Inject 1 mL into the articular space once. - meloxicam (MOBIC) 7.5 MG tablet; Take 1 tablet (7.5 mg total) by mouth daily.  Dispense: 30 tablet; Refill: 1  2. Heel spur, left - methylPREDNISolone acetate (DEPO-MEDROL) injection 40 mg; Inject 1 mL (40 mg total) into the articular space once. - bupivacaine (MARCAINE) 0.25 % (with pres) injection 1 mL; Inject 1 mL into the articular space once. - meloxicam (MOBIC) 7.5 MG tablet; Take 1 tablet (7.5 mg total) by mouth daily.  Dispense: 30 tablet; Refill: Hernandez, FNP

## 2015-03-28 ENCOUNTER — Ambulatory Visit (INDEPENDENT_AMBULATORY_CARE_PROVIDER_SITE_OTHER): Payer: Medicare Other | Admitting: Family Medicine

## 2015-03-28 ENCOUNTER — Encounter: Payer: Self-pay | Admitting: Family Medicine

## 2015-03-28 VITALS — BP 116/60 | HR 75 | Temp 98.3°F | Ht 66.0 in | Wt 118.0 lb

## 2015-03-28 DIAGNOSIS — M4692 Unspecified inflammatory spondylopathy, cervical region: Secondary | ICD-10-CM | POA: Diagnosis not present

## 2015-03-28 DIAGNOSIS — E785 Hyperlipidemia, unspecified: Secondary | ICD-10-CM

## 2015-03-28 DIAGNOSIS — M47812 Spondylosis without myelopathy or radiculopathy, cervical region: Secondary | ICD-10-CM

## 2015-03-28 DIAGNOSIS — M25562 Pain in left knee: Secondary | ICD-10-CM | POA: Diagnosis not present

## 2015-03-28 DIAGNOSIS — Z23 Encounter for immunization: Secondary | ICD-10-CM

## 2015-03-28 DIAGNOSIS — E559 Vitamin D deficiency, unspecified: Secondary | ICD-10-CM | POA: Diagnosis not present

## 2015-03-28 DIAGNOSIS — Z68.41 Body mass index (BMI) pediatric, less than 5th percentile for age: Secondary | ICD-10-CM

## 2015-03-28 NOTE — Patient Instructions (Addendum)
Medicare Annual Wellness Visit  [facility] and the medical providers at [facility] strive to bring you the best medical care.  In doing so we not only want to address your current medical conditions and concerns but also to detect new conditions early and prevent illness, disease and health-related problems.    Medicare offers a yearly Wellness Visit which allows our clinical staff to assess your need for preventative services including immunizations, lifestyle education, counseling to decrease risk of preventable diseases and screening for fall risk and other medical concerns.    This visit is provided free of charge (no copay) for all Medicare recipients. The clinical pharmacists at [facility] have begun to conduct these Wellness Visits which will also include a thorough review of all your medications.    As you primary medical provider recommend that you make an appointment for your Annual Wellness Visit if you have not done so already this year.  You may set up this appointment before you leave today or you may call back [phone] and schedule an appointment.  Please make sure when you call that you mention that you are scheduling your Annual Wellness Visit with the clinical pharmacist so that the appointment may be made for the proper length of time.     Continue current medications. Continue good therapeutic lifestyle changes which include good diet and exercise. Fall precautions discussed with patient. If an FOBT was given today- please return it to our front desk. If you are over 72 years old - you may need Prevnar 59 or the adult Pneumonia vaccine.  **Flu shots are available--- please call and schedule a FLU-CLINIC appointment**  After your visit with [place] today you will receive a survey in the mail or online from [employer] regarding your care with [place]. Please take a moment to fill this out. Your feedback is very  important to [place] as you can help [place] better understand your patient needs as well as improve your experience and satisfaction. WE CARE ABOUT YOU!!!   Try taking the meloxicam 7.5 mg 1 daily after eating for one month. If the knee pain continues please return to the clinic for an x-ray of the knee. Use some warm wet compresses on the knee when possible for 20 minutes at a time. This winter drink plenty of fluids, stay well-hydrated, keep using the Flonase and nasal saline spray for your rhinitis. Return to the office for fasting lab work.

## 2015-03-28 NOTE — Progress Notes (Signed)
Subjective:    Patient ID: Kayla Randolph, female    DOB: [birth year], 69 y.o.   MRN: [id number]  HPI Pt here for follow up and management of chronic medical problems which includes hyperlipidemia and vit d def. She is taking medications regularly. The left knee has been bothering her more stresses since she had the plantar fasciitis on the same foot. The plantar fasciitis is improved. The left knee is worse when she is on her feet and walking. She feels like it is somewhat more swollen than the right knee. She's never had any injury with this. She thinks that the [place] has helped the knee some. She does not take this regularly. Otherwise the patient denies chest pain shortness of breath trouble swallowing heartburn indigestion nausea vomiting diarrhea or blood in the stool. She's not having trouble passing her water. She has ongoing continuous nasal congestion and drainage which is clear. She's using nasal saline and using Flonase regularly.      Patient Active Problem List   Diagnosis Date Noted  . Depression 04/03/2013  . Hyperlipemia 12/14/2012  . Vitamin D deficiency 12/14/2012  . Allergic rhinitis 12/14/2012  . Cervical spine arthritis ([place]) 12/14/2012  . 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 Osteopenia    Outpatient Encounter Prescriptions as of 03/28/2015  Medication Sig  . aspirin 81 MG tablet Take 81 mg by mouth daily.    . Cholecalciferol (VITAMIN D3) 1000 UNITS CAPS Take 2 capsules by mouth daily.   . cycloSPORINE (RESTASIS) 0.05 % ophthalmic emulsion Place 1 drop into both eyes 2 (two) times daily.  Marland Kitchen escitalopram (LEXAPRO) 10 MG tablet TAKE (1) TABLET DAILY AS DIRECTED.  Marland Kitchen ESTRACE VAGINAL 0.1 MG/GM vaginal cream INSERT 1 VAGINALLY AT BEDTIME  . fluticasone (FLONASE) 50 MCG/ACT nasal spray 1 SPRAY IN EACH NOSTRIL EVERY 12 HOURS  . meloxicam (MOBIC) 7.5 MG tablet Take 1 tablet (7.5 mg total) by mouth daily.  . Polyethylene Glycol 3350 (MIRALAX PO) Take 1 packet by mouth  daily as needed.    Marland Kitchen Specialty Vitamins Products (ONE-A-DAY BONE STRENGTH) 500-28-100 MG-MG-UNIT TABS Take 1 tablet by mouth 3 (three) times daily.   No facility-administered encounter medications on file as of 03/28/2015.      Review of Systems  Constitutional: Negative.   HENT: Positive for postnasal drip.   Eyes: Negative.   Respiratory: Negative.   Cardiovascular: Negative.   Gastrointestinal: Negative.   Endocrine: Negative.   Genitourinary: Negative.   Musculoskeletal: Positive for arthralgias (left knee pain ).  Skin: Negative.   Allergic/Immunologic: Negative.   Neurological: Negative.   Hematological: Negative.   Psychiatric/Behavioral: Negative.        Objective:   Physical Exam  Constitutional: She is oriented to person, place, and time. She appears well-developed and well-nourished. No distress.  HENT:  Head: Normocephalic and atraumatic.  Right Ear: External ear normal.  Left Ear: External ear normal.  Mouth/Throat: Oropharynx is clear and moist.  Nasal turbinate congestion and redness  Eyes: Conjunctivae and EOM are normal. Pupils are equal, round, and reactive to light. Right eye exhibits no discharge. Left eye exhibits no discharge. No scleral icterus.  Neck: Normal range of motion. Neck supple. No thyromegaly present.  No carotid bruits or thyromegaly.  Cardiovascular: Normal rate, regular rhythm, normal heart sounds and intact distal pulses.   No murmur heard. The heart is regular at 60/m.  Pulmonary/Chest: Effort normal and breath sounds normal. No respiratory distress. She has no wheezes. She has no  rales. She exhibits no tenderness.  Clear anteriorly and posteriorly.  Abdominal: Soft. Bowel sounds are normal. She exhibits no mass. There is no tenderness. There is no rebound and no guarding.  No abdominal tenderness bruits or epigastric tenderness. No masses or organ enlargement.  Musculoskeletal: Normal range of motion. She exhibits no edema or  tenderness.  The left knee has good range of motion but with flexing and external rotation of pressure over the medial joint line and there was some tenderness here. There is no more rubor on one side than the other. The left knee may be slightly more swollen than the right but minimally so. I do not feel any crepitus with flexion and extension. And the patient did not have any pain with flexion and extension.  Lymphadenopathy:    She has no cervical adenopathy.  Neurological: She is alert and oriented to person, place, and time. She has normal reflexes. No cranial nerve deficit.  Skin: Skin is warm and dry. No rash noted.  Psychiatric: She has a normal mood and affect. Her behavior is normal. Judgment and thought content normal.  Nursing note and vitals reviewed.   BP 116/60 mmHg  Pulse 75  Temp(Src) 98.3 F (36.8 C) (Oral)  Ht 5' 6" (1.676 m)  Wt 118 lb (53.524 kg)  BMI 19.05 kg/m2       Assessment & Plan:  1. Hyperlipidemia -Continue with aggressive therapeutic lifestyle changes pending results of lab work - BMP8+EGFR; Future - CBC with Differential/Platelet; Future - Hepatic function panel; Future - NMR, lipoprofile; Future  2. Vitamin D deficiency -Continue vitamin D replacement pending results of lab work - CBC with Differential/Platelet; Future - Vit D  25 hydroxy (rtn osteoporosis monitoring); Future  3. Cervical spine arthritis ([place]) -This has been stable for the patient. She does take meloxicam periodically. She has numbness and tingling in her fingers at times.  4. Hyperlipemia -Continue with current treatment  5. Low weight, BMI 19. -The patient says that she eats healthy and stays active and makes a special effort to keep her weight down to a minimum. She was encouraged to take a good multivitamin and continue to monitor weight.  6. Left knee pain -Take anti-inflammatory medicine regularly for 1 month and get back in touch with [place] if the knee pain continues to  be a problem and we will get an x-ray here  Patient Instructions                       Medicare Annual Wellness Visit  [facility] and the medical providers at [facility] strive to bring you the best medical care.  In doing so we not only want to address your current medical conditions and concerns but also to detect new conditions early and prevent illness, disease and health-related problems.    Medicare offers a yearly Wellness Visit which allows our clinical staff to assess your need for preventative services including immunizations, lifestyle education, counseling to decrease risk of preventable diseases and screening for fall risk and other medical concerns.    This visit is provided free of charge (no copay) for all Medicare recipients. The clinical pharmacists at [facility] have begun to conduct these Wellness Visits which will also include a thorough review of all your medications.    As you primary medical provider recommend that you make an appointment for your Annual Wellness Visit if you have not done so already this year.  You may set up this appointment before you leave today or you may call back [phone]) and schedule an appointment.  Please make sure when you call that you mention that you are scheduling your Annual Wellness Visit with the clinical pharmacist so that the appointment may be made for the proper length of time.     Continue current medications. Continue good therapeutic lifestyle changes which include good diet and exercise. Fall precautions discussed with patient. If an FOBT was given today- please return it to our front desk. If you are over 42 years old - you may need Prevnar 4 or the adult Pneumonia vaccine.  **Flu shots are available--- please call and schedule a FLU-CLINIC appointment**  After your visit with [place] today you will receive a survey in the mail or online from [employer] regarding your care with  [place]. Please take a moment to fill this out. Your feedback is very important to [place] as you can help [place] better understand your patient needs as well as improve your experience and satisfaction. WE CARE ABOUT YOU!!!   Try taking the meloxicam 7.5 mg 1 daily after eating for one month. If the knee pain continues please return to the clinic for an x-ray of the knee. Use some warm wet compresses on the knee when possible for 20 minutes at a time. This winter drink plenty of fluids, stay well-hydrated, keep using the Flonase and nasal saline spray for your rhinitis. Return to the office for fasting lab work.   Arrie Senate MD

## 2015-03-29 ENCOUNTER — Other Ambulatory Visit: Payer: Medicare Other

## 2015-03-29 ENCOUNTER — Other Ambulatory Visit: Payer: Self-pay | Admitting: Family Medicine

## 2015-03-29 DIAGNOSIS — E785 Hyperlipidemia, unspecified: Secondary | ICD-10-CM | POA: Diagnosis not present

## 2015-03-29 DIAGNOSIS — E559 Vitamin D deficiency, unspecified: Secondary | ICD-10-CM

## 2015-03-31 LAB — VITAMIN D 25 HYDROXY (VIT D DEFICIENCY, FRACTURES): Vit D, 25-Hydroxy: 64.2 ng/mL (ref 30.0–100.0)

## 2015-03-31 LAB — CBC WITH DIFFERENTIAL/PLATELET
Basophils Absolute: 0 10*3/uL (ref 0.0–0.2)
Basos: 1 %
EOS (ABSOLUTE): 0.1 10*3/uL (ref 0.0–0.4)
Eos: 3 %
Hematocrit: 45.3 % (ref 34.0–46.6)
Hemoglobin: 14.8 g/dL (ref 11.1–15.9)
Immature Grans (Abs): 0 10*3/uL (ref 0.0–0.1)
Immature Granulocytes: 0 %
Lymphocytes Absolute: 0.7 10*3/uL (ref 0.7–3.1)
Lymphs: 16 %
MCH: 30.9 pg (ref 26.6–33.0)
MCHC: 32.7 g/dL (ref 31.5–35.7)
MCV: 95 fL (ref 79–97)
Monocytes Absolute: 0.4 10*3/uL (ref 0.1–0.9)
Monocytes: 8 %
Neutrophils Absolute: 3 10*3/uL (ref 1.4–7.0)
Neutrophils: 72 %
Platelets: 187 10*3/uL (ref 150–379)
RBC: 4.79 x10E6/uL (ref 3.77–5.28)
RDW: 14.4 % (ref 12.3–15.4)
WBC: 4.2 10*3/uL (ref 3.4–10.8)

## 2015-03-31 LAB — BMP8+EGFR
BUN/Creatinine Ratio: 22 (ref 11–26)
BUN: 15 mg/dL (ref 8–27)
CO2: 27 mmol/L (ref 18–29)
Calcium: 9 mg/dL (ref 8.7–10.3)
Chloride: 101 mmol/L (ref 97–106)
Creatinine, Ser: 0.69 mg/dL (ref 0.57–1.00)
GFR calc Af Amer: 102 mL/min/{1.73_m2} (ref >59)
GFR calc non Af Amer: 88 mL/min/{1.73_m2} (ref >59)
Glucose: 79 mg/dL (ref 65–99)
Potassium: 4.2 mmol/L (ref 3.5–5.2)
Sodium: 143 mmol/L (ref 136–144)

## 2015-03-31 LAB — NMR, LIPOPROFILE
Cholesterol: 201 mg/dL — ABNORMAL HIGH (ref 100–199)
HDL Cholesterol by NMR: 80 mg/dL (ref >39)
HDL Particle Number: 38.3 umol/L (ref >=30.5)
LDL Particle Number: 1184 nmol/L — ABNORMAL HIGH (ref <1000)
LDL Size: 21.6 nm (ref >20.5)
LDL-C: 102 mg/dL — ABNORMAL HIGH (ref 0–99)
LP-IR Score: <25 (ref <=45)
Small LDL Particle Number: <90 nmol/L (ref <=527)
Triglycerides by NMR: 95 mg/dL (ref 0–149)

## 2015-03-31 LAB — HEPATIC FUNCTION PANEL
ALT: 17 IU/L (ref 0–32)
AST: 24 IU/L (ref 0–40)
Albumin: 3.9 g/dL (ref 3.5–4.8)
Alkaline Phosphatase: 52 IU/L (ref 39–117)
Bilirubin Total: 0.5 mg/dL (ref 0.0–1.2)
Bilirubin, Direct: 0.14 mg/dL (ref 0.00–0.40)
Total Protein: 6 g/dL (ref 6.0–8.5)

## 2015-04-21 ENCOUNTER — Encounter: Payer: Self-pay | Admitting: Pharmacist

## 2015-04-21 ENCOUNTER — Ambulatory Visit (INDEPENDENT_AMBULATORY_CARE_PROVIDER_SITE_OTHER): Payer: Medicare Other | Admitting: Pharmacist

## 2015-04-21 VITALS — BP 116/62 | HR 68 | Ht 65.5 in | Wt 118.0 lb

## 2015-04-21 DIAGNOSIS — E7800 Pure hypercholesterolemia, unspecified: Secondary | ICD-10-CM | POA: Diagnosis not present

## 2015-04-21 DIAGNOSIS — R636 Underweight: Secondary | ICD-10-CM | POA: Diagnosis not present
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
## 2015-06-16 ENCOUNTER — Encounter: Payer: Self-pay | Admitting: Family Medicine

## 2015-06-16 ENCOUNTER — Ambulatory Visit (INDEPENDENT_AMBULATORY_CARE_PROVIDER_SITE_OTHER): Payer: Medicare Other

## 2015-06-16 ENCOUNTER — Ambulatory Visit (INDEPENDENT_AMBULATORY_CARE_PROVIDER_SITE_OTHER): Payer: Medicare Other | Admitting: Family Medicine

## 2015-06-16 VITALS — BP 115/60 | HR 70 | Temp 97.4°F | Ht 65.5 in | Wt 119.0 lb

## 2015-06-16 DIAGNOSIS — M1712 Unilateral primary osteoarthritis, left knee: Secondary | ICD-10-CM | POA: Diagnosis not present

## 2015-06-16 DIAGNOSIS — M25562 Pain in left knee: Secondary | ICD-10-CM | POA: Diagnosis not present

## 2015-06-16 MED ORDER — MELOXICAM 15 MG PO TABS: ORAL_TABLET | ORAL | Status: DC

## 2015-06-16 NOTE — Progress Notes (Signed)
Subjective:    Patient ID: Kayla Randolph, female    DOB: [birth year], 70 y.o.   MRN: [id number]  HPI Patient here today for left knee pain. She has a history of plantar fasciitis. She thinks that this may have been the culprit. The discomfort in the knee has been going on since Christmas. She does note that she missed a step back in the summertime and put a lot of exercise stress on the left leg. It got better at that time. The problems recurred since December if she does not recall any specific injury but that time. It is especially worse with standing. She has been taking some ibuprofen and some meloxicam.     Patient Active Problem List   Diagnosis Date Noted  . Depression 04/03/2013  . Hyperlipemia 12/14/2012  . Vitamin D deficiency 12/14/2012  . Allergic rhinitis 12/14/2012  . 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 Osteopenia    Outpatient Encounter Prescriptions as of 06/16/2015  Medication Sig  . aspirin 81 MG tablet Take 81 mg by mouth daily.    . Cholecalciferol (VITAMIN D3) 1000 UNITS CAPS Take 2 capsules by mouth daily.   . cycloSPORINE (RESTASIS) 0.05 % ophthalmic emulsion Place 1 drop into both eyes 2 (two) times daily.  Marland Kitchen escitalopram (LEXAPRO) 10 MG tablet TAKE (1) TABLET DAILY AS DIRECTED.  Marland Kitchen ESTRACE VAGINAL 0.1 MG/GM vaginal cream INSERT 1 VAGINALLY AT BEDTIME  . fluticasone (FLONASE) 50 MCG/ACT nasal spray 1 SPRAY IN EACH NOSTRIL EVERY 12 HOURS  . meloxicam (MOBIC) 7.5 MG tablet Take 1 tablet (7.5 mg total) by mouth daily.  . Polyethylene Glycol 3350 (MIRALAX PO) Take 1 packet by mouth daily as needed.    Marland Kitchen Specialty Vitamins Products (ONE-A-DAY BONE STRENGTH) 500-28-100 MG-MG-UNIT TABS Take 1 tablet by mouth 3 (three) times daily.   No facility-administered encounter medications on file as of 06/16/2015.      Review of Systems  Constitutional: Negative.   HENT: Negative.   Eyes: Negative.   Respiratory: Negative.    Cardiovascular: Negative.   Gastrointestinal: Negative.   Endocrine: Negative.   Genitourinary: Negative.   Musculoskeletal: Positive for arthralgias (left knee pain).  Skin: Negative.   Allergic/Immunologic: Negative.   Neurological: Negative.   Hematological: Negative.   Psychiatric/Behavioral: Negative.        Objective:   Physical Exam  Constitutional: She is oriented to person, place, and time. She appears well-developed and well-nourished. No distress.  HENT:  Head: Normocephalic and atraumatic.  Eyes: Conjunctivae are normal. Pupils are equal, round, and reactive to light. Right eye exhibits no discharge. Left eye exhibits no discharge. No scleral icterus.  Neck: Normal range of motion.  Musculoskeletal: Normal range of motion. She exhibits tenderness. She exhibits no edema.  There is tenderness at the left lateral joint line of the left knee with flexion and extension and internal and external rotation. There is minimal if any swelling. There is no pedal edema.  Neurological: She is alert and oriented to person, place, and time.  Skin: Skin is warm and dry. No rash noted.  Psychiatric: She has a normal mood and affect. Her behavior is normal. Thought content normal.  Nursing note and vitals reviewed.   BP 115/60 mmHg  Pulse 70  Temp(Src) 97.4 F (36.3 C) (Oral)  Ht 5' 5.5" (1.664 m)  Wt 119 lb (53.978 kg)  BMI 19.49 kg/m2  [facility] reading (PRIMARY) by  Dr. Louretta Parma knee degenerative changes  mild                                       Assessment & Plan:  1. Left knee pain - DG Knee 1-2 Views Left; Future - meloxicam (MOBIC) 15 MG tablet; Take one half to one daily after eating as directed  Dispense: 30 tablet; Refill: 0  2. Primary osteoarthritis of left knee  Patient Instructions  Take meloxicam 15 mg 1 daily after eating for 1 week and then reduce to 7-1/2 mg for 2 weeks after eating. If the medication bothers her stomach discontinue it. Call us in 3 weeks if  there's no improvement Use warm wet compresses 20 minutes 3 or 4 times daily   Arrie Senate MD

## 2015-06-16 NOTE — Patient Instructions (Signed)
Take meloxicam 15 mg 1 daily after eating for 1 week and then reduce to 7-1/2 mg for 2 weeks after eating. If the medication bothers her stomach discontinue it. Call us in 3 weeks if there's no improvement Use warm wet compresses 20 minutes 3 or 4 times daily

## 2015-06-19 DIAGNOSIS — H2513 Age-related nuclear cataract, bilateral: Secondary | ICD-10-CM | POA: Diagnosis not present

## 2015-06-19 DIAGNOSIS — H04123 Dry eye syndrome of bilateral lacrimal glands: Secondary | ICD-10-CM | POA: Diagnosis not present

## 2015-06-25 ENCOUNTER — Telehealth: Payer: Self-pay | Admitting: Family Medicine

## 2015-07-09 ENCOUNTER — Encounter: Payer: Self-pay | Admitting: Family Medicine

## 2015-08-07 ENCOUNTER — Other Ambulatory Visit: Payer: Medicare Other

## 2015-08-07 DIAGNOSIS — E559 Vitamin D deficiency, unspecified: Secondary | ICD-10-CM | POA: Diagnosis not present

## 2015-08-07 DIAGNOSIS — E785 Hyperlipidemia, unspecified: Secondary | ICD-10-CM

## 2015-08-07 DIAGNOSIS — D649 Anemia, unspecified: Secondary | ICD-10-CM | POA: Diagnosis not present

## 2015-08-08 LAB — BMP8+EGFR
BUN/Creatinine Ratio: 23 (ref 11–26)
BUN: 17 mg/dL (ref 8–27)
CO2: 26 mmol/L (ref 18–29)
Calcium: 9.4 mg/dL (ref 8.7–10.3)
Chloride: 99 mmol/L (ref 96–106)
Creatinine, Ser: 0.73 mg/dL (ref 0.57–1.00)
GFR calc Af Amer: 96 mL/min/{1.73_m2} (ref >59)
GFR calc non Af Amer: 83 mL/min/{1.73_m2} (ref >59)
Glucose: 80 mg/dL (ref 65–99)
Potassium: 4.2 mmol/L (ref 3.5–5.2)
Sodium: 142 mmol/L (ref 134–144)

## 2015-08-08 LAB — NMR, LIPOPROFILE
Cholesterol: 213 mg/dL — ABNORMAL HIGH (ref 100–199)
HDL Cholesterol by NMR: 83 mg/dL (ref >39)
HDL Particle Number: 41.9 umol/L (ref >=30.5)
LDL Particle Number: 1228 nmol/L — ABNORMAL HIGH (ref <1000)
LDL Size: 22.2 nm (ref >20.5)
LDL-C: 113 mg/dL — ABNORMAL HIGH (ref 0–99)
LP-IR Score: 25 (ref <=45)
Small LDL Particle Number: <90 nmol/L (ref <=527)
Triglycerides by NMR: 85 mg/dL (ref 0–149)

## 2015-08-08 LAB — HEPATIC FUNCTION PANEL
ALT: 18 IU/L (ref 0–32)
AST: 26 IU/L (ref 0–40)
Albumin: 4.1 g/dL (ref 3.5–4.8)
Alkaline Phosphatase: 59 IU/L (ref 39–117)
Bilirubin Total: 0.4 mg/dL (ref 0.0–1.2)
Bilirubin, Direct: 0.12 mg/dL (ref 0.00–0.40)
Total Protein: 6.2 g/dL (ref 6.0–8.5)

## 2015-08-08 LAB — CBC WITH DIFFERENTIAL/PLATELET
Basophils Absolute: 0 10*3/uL (ref 0.0–0.2)
Basos: 0 %
EOS (ABSOLUTE): 0.2 10*3/uL (ref 0.0–0.4)
Eos: 5 %
Hematocrit: 43.9 % (ref 34.0–46.6)
Hemoglobin: 14.6 g/dL (ref 11.1–15.9)
Immature Grans (Abs): 0 10*3/uL (ref 0.0–0.1)
Immature Granulocytes: 0 %
Lymphocytes Absolute: 1 10*3/uL (ref 0.7–3.1)
Lymphs: 22 %
MCH: 30.2 pg (ref 26.6–33.0)
MCHC: 33.3 g/dL (ref 31.5–35.7)
MCV: 91 fL (ref 79–97)
Monocytes Absolute: 0.5 10*3/uL (ref 0.1–0.9)
Monocytes: 12 %
Neutrophils Absolute: 2.8 10*3/uL (ref 1.4–7.0)
Neutrophils: 61 %
Platelets: 210 10*3/uL (ref 150–379)
RBC: 4.84 x10E6/uL (ref 3.77–5.28)
RDW: 13.9 % (ref 12.3–15.4)
WBC: 4.6 10*3/uL (ref 3.4–10.8)

## 2015-08-08 LAB — VITAMIN D 25 HYDROXY (VIT D DEFICIENCY, FRACTURES): Vit D, 25-Hydroxy: 64.8 ng/mL (ref 30.0–100.0)

## 2015-08-11 ENCOUNTER — Ambulatory Visit (INDEPENDENT_AMBULATORY_CARE_PROVIDER_SITE_OTHER): Payer: Medicare Other | Admitting: Family Medicine

## 2015-08-11 ENCOUNTER — Encounter: Payer: Self-pay | Admitting: Family Medicine

## 2015-08-11 VITALS — BP 123/70 | HR 72 | Temp 98.2°F | Ht 65.5 in | Wt 117.0 lb

## 2015-08-11 DIAGNOSIS — E559 Vitamin D deficiency, unspecified: Secondary | ICD-10-CM

## 2015-08-11 DIAGNOSIS — M25561 Pain in right knee: Secondary | ICD-10-CM

## 2015-08-11 DIAGNOSIS — E785 Hyperlipidemia, unspecified: Secondary | ICD-10-CM | POA: Diagnosis not present

## 2015-08-11 DIAGNOSIS — M25562 Pain in left knee: Secondary | ICD-10-CM

## 2015-08-11 MED ORDER — FLUTICASONE PROPIONATE 50 MCG/ACT NA SUSP: NASAL | Status: DC

## 2015-08-11 MED ORDER — ESCITALOPRAM OXALATE 10 MG PO TABS: ORAL_TABLET | ORAL | Status: DC

## 2015-08-11 MED ORDER — MELOXICAM 15 MG PO TABS: ORAL_TABLET | ORAL | Status: DC

## 2015-08-11 MED ORDER — ESTRADIOL 0.1 MG/GM VA CREA: TOPICAL_CREAM | VAGINAL | Status: DC

## 2015-08-11 NOTE — Patient Instructions (Addendum)
Medicare Annual Wellness Visit  [facility] and the medical providers at [facility] strive to bring you the best medical care.  In doing so we not only want to address your current medical conditions and concerns but also to detect new conditions early and prevent illness, disease and health-related problems.    Medicare offers a yearly Wellness Visit which allows our clinical staff to assess your need for preventative services including immunizations, lifestyle education, counseling to decrease risk of preventable diseases and screening for fall risk and other medical concerns.    This visit is provided free of charge (no copay) for all Medicare recipients. The clinical pharmacists at [facility] have begun to conduct these Wellness Visits which will also include a thorough review of all your medications.    As you primary medical provider recommend that you make an appointment for your Annual Wellness Visit if you have not done so already this year.  You may set up this appointment before you leave today or you may call back [phone] and schedule an appointment.  Please make sure when you call that you mention that you are scheduling your Annual Wellness Visit with the clinical pharmacist so that the appointment may be made for the proper length of time.     Continue current medications. Continue good therapeutic lifestyle changes which include good diet and exercise. Fall precautions discussed with patient. If an FOBT was given today- please return it to our front desk. If you are over 70 years old - you may need Prevnar 49 or the adult Pneumonia vaccine.  **Flu shots are available--- please call and schedule a FLU-CLINIC appointment**  After your visit with [place] today you will receive a survey in the mail or online from [employer] regarding your care with [place]. Please take a moment to fill this out. Your feedback is very  important to [place] as you can help [place] better understand your patient needs as well as improve your experience and satisfaction. WE CARE ABOUT YOU!!!   Stop eating M&M's Try to get more walking and exercise  We'll defer statin drug at this visit, but if the cholesterol remains elevated at the next visit we will consider starting Crestor 5 mg on Monday Wednesday and Friday along with therapeutic lifestyle changes We will arrange for you to have a visit with the orthopedic surgeon when he comes in April to evaluate your knees and foot In the meantime, take meloxicam 15 one  daily for 7 days after eating and then one half daily until you see the orthopedic surgeon B careful do not put yourself at risk for falling

## 2015-08-11 NOTE — Progress Notes (Signed)
Subjective:    Patient ID: Kayla Randolph, female    DOB: 1945-05-23, 70 y.o.   MRN: [id number]  HPI Pt here for follow up and management of chronic medical problems which includes hyperlipidemia. She is taking medications regularly.The patient's recent lab work was reviewed with her. Everything was excellent but her current total LDL particle number continues to creep upward. She has joined an exercise facility recently and she is hoping that this will help more and wants to defer starting any statin drug. We agreed to wait until the next blood check and if it's not better we'll consider starting a low-dose statin drug at that time. In the process of joining the exercise facility she did twist her right knee. X-rays of both knees recently have shown degenerative changes. Because of this ongoing problem we will arrange for her to see the orthopedic surgeon for any follow-up and suggestions about her knee pain. She denies any chest pain or shortness of breath. She is having no trouble with swallowing heartburn indigestion nausea vomiting diarrhea or blood in the stool. She is passing her water without problems. She's been using nasal saline and nasal saline gel for her nasal irritation. She is also using a cool mist humidifier.    Patient Active Problem List   Diagnosis Date Noted  . Depression 04/03/2013  . Hyperlipemia 12/14/2012  . Vitamin D deficiency 12/14/2012  . Allergic rhinitis 12/14/2012  . Cervical spine arthritis ([place]) 12/14/2012  . 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 Osteopenia    Outpatient Encounter Prescriptions as of 08/11/2015  Medication Sig  . aspirin 81 MG tablet Take 81 mg by mouth daily.    . Cholecalciferol (VITAMIN D3) 1000 UNITS CAPS Take 2 capsules by mouth daily.   . cycloSPORINE (RESTASIS) 0.05 % ophthalmic emulsion Place 1 drop into both eyes 2 (two) times daily.  Marland Kitchen escitalopram (LEXAPRO) 10 MG tablet TAKE (1) TABLET DAILY AS DIRECTED.  Marland Kitchen ESTRACE VAGINAL 0.1  MG/GM vaginal cream INSERT 1 VAGINALLY AT BEDTIME  . fluticasone (FLONASE) 50 MCG/ACT nasal spray 1 SPRAY IN EACH NOSTRIL EVERY 12 HOURS  . meloxicam (MOBIC) 15 MG tablet Take one half to one daily after eating as directed  . Polyethylene Glycol 3350 (MIRALAX PO) Take 1 packet by mouth daily as needed.    Marland Kitchen Specialty Vitamins Products (ONE-A-DAY BONE STRENGTH) 500-28-100 MG-MG-UNIT TABS Take 1 tablet by mouth 3 (three) times daily.   No facility-administered encounter medications on file as of 08/11/2015.       Review of Systems  Constitutional: Negative.   HENT: Negative.   Eyes: Negative.   Respiratory: Negative.   Cardiovascular: Negative.   Gastrointestinal: Negative.   Endocrine: Negative.   Genitourinary: Negative.   Musculoskeletal: Positive for arthralgias (bilateral knee pain - also  - left foot pain).  Skin: Negative.   Allergic/Immunologic: Negative.   Neurological: Negative.   Hematological: Negative.   Psychiatric/Behavioral: Positive for decreased concentration.       Objective:   Physical Exam  Constitutional: She is oriented to person, place, and time. She appears well-developed and well-nourished.  HENT:  Head: Normocephalic and atraumatic.  Right Ear: External ear normal.  Left Ear: External ear normal.  Mouth/Throat: Oropharynx is clear and moist.  Right nasal septal irritation with bleeding.  Eyes: Conjunctivae and EOM are normal. Pupils are equal, round, and reactive to light. Right eye exhibits no discharge. Left eye exhibits no discharge. No scleral icterus.  Neck: Normal range of motion. Neck  supple. No thyromegaly present.  No bruits or anterior cervical adenopathy  Cardiovascular: Normal rate, regular rhythm, normal heart sounds and intact distal pulses.  Exam reveals no gallop and no friction rub.   No murmur heard. Rhythm is regular at 72/m  Pulmonary/Chest: Effort normal and breath sounds normal. No respiratory distress. She has no wheezes. She  has no rales. She exhibits no tenderness.  Clear anteriorly and posteriorly  Abdominal: Soft. Bowel sounds are normal. She exhibits no mass. There is no tenderness. There is no rebound and no guarding.  Slight abdominal tenderness in general with no specific location of tenderness.  Musculoskeletal: Normal range of motion. She exhibits no edema or tenderness.  Fairly good range of motion of both knees with somewhat vague symptoms.  Lymphadenopathy:    She has no cervical adenopathy.  Neurological: She is alert and oriented to person, place, and time. She has normal reflexes. No cranial nerve deficit.  Skin: Skin is warm and dry. No rash noted.  Psychiatric: She has a normal mood and affect. Her behavior is normal. Judgment and thought content normal.  Nursing note and vitals reviewed.  BP 123/70 mmHg  Pulse 72  Temp(Src) 98.2 F (36.8 C) (Oral)  Ht 5' 5.5" (1.664 m)  Wt 117 lb (53.071 kg)  BMI 19.17 kg/m2        Assessment & Plan:  1. Hyperlipidemia -Continue with diet and exercise. If the total LDL particle number does not come down by the next visit we will consider starting Crestor 5 mg Monday Wednesday and Friday and the patient agrees to this.  2. Vitamin D deficiency -The vitamin D level was good and she should continue with current treatment  3. Left knee pain -The patient has had ongoing problems with both knees and we will arrange for her to see the orthopedic surgeon at his next visit. In the meantime she will continue to take anti-inflammatory medicine as directed - meloxicam (MOBIC) 15 MG tablet; Take one half to one daily after eating as directed  Dispense: 30 tablet; Refill: 3 - Ambulatory referral to Orthopedic Surgery  4. Bilateral knee pain -Take meloxicam as directed until visit with orthopedic surgeon - Ambulatory referral to Orthopedic Surgery  Meds ordered this encounter  Medications  . escitalopram (LEXAPRO) 10 MG tablet    Sig: TAKE (1) TABLET DAILY  AS DIRECTED.    Dispense:  90 tablet    Refill:  3  . fluticasone (FLONASE) 50 MCG/ACT nasal spray    Sig: 1 SPRAY IN EACH NOSTRIL EVERY 12 HOURS    Dispense:  48 g    Refill:  3  . estradiol (ESTRACE VAGINAL) 0.1 MG/GM vaginal cream    Sig: INSERT 1 VAGINALLY AT BEDTIME    Dispense:  42.5 g    Refill:  11  . meloxicam (MOBIC) 15 MG tablet    Sig: Take one half to one daily after eating as directed    Dispense:  30 tablet    Refill:  3   Patient Instructions                       Medicare Annual Wellness Visit  [facility] and the medical providers at [facility] strive to bring you the best medical care.  In doing so we not only want to address your current medical conditions and concerns but also to detect new conditions early and prevent illness, disease and health-related problems.  Medicare offers a yearly Wellness Visit which allows our clinical staff to assess your need for preventative services including immunizations, lifestyle education, counseling to decrease risk of preventable diseases and screening for fall risk and other medical concerns.    This visit is provided free of charge (no copay) for all Medicare recipients. The clinical pharmacists at [facility] have begun to conduct these Wellness Visits which will also include a thorough review of all your medications.    As you primary medical provider recommend that you make an appointment for your Annual Wellness Visit if you have not done so already this year.  You may set up this appointment before you leave today or you may call back [phone] and schedule an appointment.  Please make sure when you call that you mention that you are scheduling your Annual Wellness Visit with the clinical pharmacist so that the appointment may be made for the proper length of time.     Continue current medications. Continue good therapeutic lifestyle changes which include good diet and  exercise. Fall precautions discussed with patient. If an FOBT was given today- please return it to our front desk. If you are over 78 years old - you may need Prevnar 31 or the adult Pneumonia vaccine.  **Flu shots are available--- please call and schedule a FLU-CLINIC appointment**  After your visit with [place] today you will receive a survey in the mail or online from [employer] regarding your care with [place]. Please take a moment to fill this out. Your feedback is very important to [place] as you can help [place] better understand your patient needs as well as improve your experience and satisfaction. WE CARE ABOUT YOU!!!   Stop eating M&M's Try to get more walking and exercise  We'll defer statin drug at this visit, but if the cholesterol remains elevated at the next visit we will consider starting Crestor 5 mg on Monday Wednesday and Friday along with therapeutic lifestyle changes We will arrange for you to have a visit with the orthopedic surgeon when he comes in April to evaluate your knees and foot In the meantime, take meloxicam 15 one  daily for 7 days after eating and then one half daily until you see the orthopedic surgeon B careful do not put yourself at risk for falling    Arrie Senate MD

## 2015-10-23 DIAGNOSIS — M1712 Unilateral primary osteoarthritis, left knee: Secondary | ICD-10-CM | POA: Diagnosis not present

## 2015-12-22 ENCOUNTER — Telehealth: Payer: Self-pay | Admitting: Family Medicine

## 2015-12-22 DIAGNOSIS — E785 Hyperlipidemia, unspecified: Secondary | ICD-10-CM

## 2015-12-22 DIAGNOSIS — E559 Vitamin D deficiency, unspecified: Secondary | ICD-10-CM

## 2015-12-22 NOTE — Telephone Encounter (Signed)
Orders placed.

## 2015-12-23 ENCOUNTER — Other Ambulatory Visit: Payer: Medicare Other

## 2015-12-23 DIAGNOSIS — E559 Vitamin D deficiency, unspecified: Secondary | ICD-10-CM | POA: Diagnosis not present

## 2015-12-23 DIAGNOSIS — E785 Hyperlipidemia, unspecified: Secondary | ICD-10-CM

## 2015-12-24 LAB — CBC WITH DIFFERENTIAL/PLATELET
Basophils Absolute: 0 10*3/uL (ref 0.0–0.2)
Basos: 1 %
EOS (ABSOLUTE): 0.2 10*3/uL (ref 0.0–0.4)
Eos: 4 %
Hematocrit: 44.5 % (ref 34.0–46.6)
Hemoglobin: 14.5 g/dL (ref 11.1–15.9)
Immature Grans (Abs): 0 10*3/uL (ref 0.0–0.1)
Immature Granulocytes: 0 %
Lymphocytes Absolute: 1.3 10*3/uL (ref 0.7–3.1)
Lymphs: 29 %
MCH: 30 pg (ref 26.6–33.0)
MCHC: 32.6 g/dL (ref 31.5–35.7)
MCV: 92 fL (ref 79–97)
Monocytes Absolute: 0.4 10*3/uL (ref 0.1–0.9)
Monocytes: 8 %
Neutrophils Absolute: 2.6 10*3/uL (ref 1.4–7.0)
Neutrophils: 58 %
Platelets: 191 10*3/uL (ref 150–379)
RBC: 4.83 x10E6/uL (ref 3.77–5.28)
RDW: 15 % (ref 12.3–15.4)
WBC: 4.5 10*3/uL (ref 3.4–10.8)

## 2015-12-24 LAB — HEPATIC FUNCTION PANEL
ALT: 14 IU/L (ref 0–32)
AST: 24 IU/L (ref 0–40)
Albumin: 4.2 g/dL (ref 3.5–4.8)
Alkaline Phosphatase: 49 IU/L (ref 39–117)
Bilirubin Total: 0.5 mg/dL (ref 0.0–1.2)
Bilirubin, Direct: 0.13 mg/dL (ref 0.00–0.40)
Total Protein: 6.2 g/dL (ref 6.0–8.5)

## 2015-12-24 LAB — BMP8+EGFR
BUN/Creatinine Ratio: 23 (ref 12–28)
BUN: 16 mg/dL (ref 8–27)
CO2: 29 mmol/L (ref 18–29)
Calcium: 9.1 mg/dL (ref 8.7–10.3)
Chloride: 101 mmol/L (ref 96–106)
Creatinine, Ser: 0.69 mg/dL (ref 0.57–1.00)
GFR calc Af Amer: 101 mL/min/{1.73_m2} (ref >59)
GFR calc non Af Amer: 88 mL/min/{1.73_m2} (ref >59)
Glucose: 80 mg/dL (ref 65–99)
Potassium: 4.2 mmol/L (ref 3.5–5.2)
Sodium: 143 mmol/L (ref 134–144)

## 2015-12-24 LAB — NMR, LIPOPROFILE
Cholesterol: 198 mg/dL (ref 100–199)
HDL Cholesterol by NMR: 83 mg/dL (ref >39)
HDL Particle Number: 41 umol/L (ref >=30.5)
LDL Particle Number: 1129 nmol/L — ABNORMAL HIGH (ref <1000)
LDL Size: 21.8 nm (ref >20.5)
LDL-C: 100 mg/dL — ABNORMAL HIGH (ref 0–99)
LP-IR Score: <25 (ref <=45)
Small LDL Particle Number: <90 nmol/L (ref <=527)
Triglycerides by NMR: 76 mg/dL (ref 0–149)

## 2015-12-24 LAB — VITAMIN D 25 HYDROXY (VIT D DEFICIENCY, FRACTURES): Vit D, 25-Hydroxy: 65.6 ng/mL (ref 30.0–100.0)

## 2015-12-25 ENCOUNTER — Ambulatory Visit (INDEPENDENT_AMBULATORY_CARE_PROVIDER_SITE_OTHER): Payer: Medicare Other

## 2015-12-25 ENCOUNTER — Encounter: Payer: Self-pay | Admitting: Family Medicine

## 2015-12-25 ENCOUNTER — Ambulatory Visit (INDEPENDENT_AMBULATORY_CARE_PROVIDER_SITE_OTHER): Payer: Medicare Other | Admitting: Family Medicine

## 2015-12-25 VITALS — BP 120/68 | HR 64 | Temp 97.4°F | Ht 65.5 in | Wt 115.0 lb

## 2015-12-25 DIAGNOSIS — M79672 Pain in left foot: Secondary | ICD-10-CM | POA: Diagnosis not present

## 2015-12-25 DIAGNOSIS — H6121 Impacted cerumen, right ear: Secondary | ICD-10-CM

## 2015-12-25 DIAGNOSIS — E785 Hyperlipidemia, unspecified: Secondary | ICD-10-CM

## 2015-12-25 DIAGNOSIS — Z0000 Encounter for general adult medical examination without abnormal findings: Secondary | ICD-10-CM

## 2015-12-25 DIAGNOSIS — E559 Vitamin D deficiency, unspecified: Secondary | ICD-10-CM | POA: Diagnosis not present

## 2015-12-25 MED ORDER — FLUTICASONE PROPIONATE 50 MCG/ACT NA SUSP: NASAL | 3 refills | Status: DC

## 2015-12-25 MED ORDER — ESTRADIOL 0.1 MG/GM VA CREA: TOPICAL_CREAM | VAGINAL | 11 refills | Status: DC

## 2015-12-25 NOTE — Patient Instructions (Addendum)
Medicare Annual Wellness Visit  [facility] and the medical providers at [facility] strive to bring you the best medical care.  In doing so we not only want to address your current medical conditions and concerns but also to detect new conditions early and prevent illness, disease and health-related problems.    Medicare offers a yearly Wellness Visit which allows our clinical staff to assess your need for preventative services including immunizations, lifestyle education, counseling to decrease risk of preventable diseases and screening for fall risk and other medical concerns.    This visit is provided free of charge (no copay) for all Medicare recipients. The clinical pharmacists at [facility] have begun to conduct these Wellness Visits which will also include a thorough review of all your medications.    As you primary medical provider recommend that you make an appointment for your Annual Wellness Visit if you have not done so already this year.  You may set up this appointment before you leave today or you may call back [phone] and schedule an appointment.  Please make sure when you call that you mention that you are scheduling your Annual Wellness Visit with the clinical pharmacist so that the appointment may be made for the proper length of time.     Continue current medications. Continue good therapeutic lifestyle changes which include good diet and exercise. Fall precautions discussed with patient. If an FOBT was given today- please return it to our front desk. If you are over 53 years old - you may need Prevnar 67 or the adult Pneumonia vaccine.  **Flu shots are available--- please call and schedule a FLU-CLINIC appointment**  After your visit with [place] today you will receive a survey in the mail or online from [employer] regarding your care with [place]. Please take a moment to fill this out. Your feedback is very  important to [place] as you can help [place] better understand your patient needs as well as improve your experience and satisfaction. WE CARE ABOUT YOU!!!   Take ibuprofen as needed for left foot pain Wear good shoes Continue to take current medications If you develop problems with ear cerumen. You can use Debrox which is over-the-counter to help soften the earwax so that it will come out of the ear more easily

## 2015-12-25 NOTE — Addendum Note (Signed)
Addended by: Zannie Cove on: 12/25/2015 11:19 AM   Modules accepted: Orders

## 2015-12-25 NOTE — Progress Notes (Signed)
Subjective:    Patient ID: Kayla Randolph, female    DOB: 05/18/1945, 70 y.o.   MRN: [id number]  HPI Pt here for follow up and management of chronic medical problems which includes hyperlipidemia. She is taking medications regularly.The patient today does complain of a knot in her left foot and some left foot pain. She also wants to make sure that she doesn't have any ear wax in the right ear canal. She is requesting a refill on her nose spray and her Estrace. She has had recent lab work and this will be reviewed with her during the visit today. She is also due to get a chest x-ray and to return an FOBT. Her last colonoscopy was in 2013. All lab numbers were good except her total LDL particle number was elevated but actually lower than it was in the past. Her hemoglobin blood Sugar renal function and liver function test and vitamin D were all good. The patient denies any chest pain shortness of breath trouble swallowing heartburn indigestion nausea vomiting diarrhea blood in the stool or black tarry bowel movements. She is passing her water without problems. She's noticed some left lateral foot pain at times not all the time. There is slight swelling at the base of the fifth metatarsal. There is no redness there.    Patient Active Problem List   Diagnosis Date Noted  . Depression 04/03/2013  . Hyperlipemia 12/14/2012  . Vitamin D deficiency 12/14/2012  . Allergic rhinitis 12/14/2012  . Cervical spine arthritis ([place]) 12/14/2012  . 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 Osteopenia    Outpatient Encounter Prescriptions as of 12/25/2015  Medication Sig  . aspirin 81 MG tablet Take 81 mg by mouth daily.    . Cholecalciferol (VITAMIN D3) 1000 UNITS CAPS Take 2 capsules by mouth daily.   . cycloSPORINE (RESTASIS) 0.05 % ophthalmic emulsion Place 1 drop into both eyes 2 (two) times daily.  Marland Kitchen escitalopram (LEXAPRO) 10 MG tablet TAKE (1) TABLET DAILY AS DIRECTED.  Marland Kitchen estradiol (ESTRACE VAGINAL) 0.1  MG/GM vaginal cream INSERT 1 VAGINALLY AT BEDTIME  . fluticasone (FLONASE) 50 MCG/ACT nasal spray 1 SPRAY IN EACH NOSTRIL EVERY 12 HOURS  . meloxicam (MOBIC) 15 MG tablet Take one half to one daily after eating as directed  . Polyethylene Glycol 3350 (MIRALAX PO) Take 1 packet by mouth daily as needed.    Marland Kitchen Specialty Vitamins Products (ONE-A-DAY BONE STRENGTH) 500-28-100 MG-MG-UNIT TABS Take 1 tablet by mouth 3 (three) times daily.  . [DISCONTINUED] estradiol (ESTRACE VAGINAL) 0.1 MG/GM vaginal cream INSERT 1 VAGINALLY AT BEDTIME  . [DISCONTINUED] fluticasone (FLONASE) 50 MCG/ACT nasal spray 1 SPRAY IN EACH NOSTRIL EVERY 12 HOURS   No facility-administered encounter medications on file as of 12/25/2015.       Review of Systems  Constitutional: Negative.   HENT: Negative.   Eyes: Negative.   Respiratory: Negative.   Cardiovascular: Negative.   Gastrointestinal: Negative.   Endocrine: Negative.   Genitourinary: Negative.   Musculoskeletal: Positive for arthralgias (left foot pain and knot).  Skin: Negative.   Allergic/Immunologic: Negative.   Neurological: Negative.   Hematological: Negative.   Psychiatric/Behavioral: Negative.        Objective:   Physical Exam  Constitutional: She is oriented to person, place, and time. She appears well-developed and well-nourished. No distress.  Pleasant and alert  HENT:  Head: Normocephalic and atraumatic.  Left Ear: External ear normal.  Mouth/Throat: Oropharynx is clear and moist.  Slight nasal congestion and  ear cerumen in the right ear canal  Eyes: Conjunctivae and EOM are normal. Pupils are equal, round, and reactive to light. Right eye exhibits no discharge. Left eye exhibits no discharge. No scleral icterus.  Neck: Normal range of motion. Neck supple. No thyromegaly present.  No bruits adenopathy or thyromegaly  Cardiovascular: Normal rate, regular rhythm, normal heart sounds and intact distal pulses.   No murmur heard. The heart  has a regular rate and rhythm at 72/m  Pulmonary/Chest: Effort normal and breath sounds normal. No respiratory distress. She has no wheezes. She has no rales.  Clear anteriorly and posteriorly  Abdominal: Soft. Bowel sounds are normal. She exhibits no mass. There is no tenderness. There is no rebound and no guarding.  There was no liver or spleen enlargement. There is no inguinal adenopathy with good inguinal pulses. There were no bruits.  Musculoskeletal: Normal range of motion. She exhibits tenderness. She exhibits no edema.  There is slight swelling at the left lateral foot at the base of the fifth metatarsal and more compared to the right foot  Lymphadenopathy:    She has no cervical adenopathy.  Neurological: She is alert and oriented to person, place, and time. She has normal reflexes. No cranial nerve deficit.  Skin: Skin is warm and dry. No rash noted.  Psychiatric: She has a normal mood and affect. Her behavior is normal. Judgment and thought content normal.  Nursing note and vitals reviewed.  BP 120/68 (BP Location: Left Arm)   Pulse 64   Temp 97.4 F (36.3 C) (Oral)   Ht 5' 5.5" (1.664 m)   Wt 115 lb (52.2 kg)   BMI 18.85 kg/m   [facility] reading (PRIMARY) by  Dr. Brunilda Payor x-ray and left foot films with results pending                                       Assessment & Plan:  1. Hyperlipidemia -The patient's cholesterol numbers have improved though they're still not at goal. She will continue with aggressive therapeutic lifestyle changes which include diet and exercise. - DG Chest 2 View; Future  2. Vitamin D deficiency -The vitamin D level is good and she will continue with current treatment  3. Health care maintenance -Get mammogram in September as planned - DG Chest 2 View; Future  4. Left foot pain -X-ray of left foot today and take ibuprofen as needed for pain  5. Right ear impacted cerumen -Ear irrigation.  Meds ordered this encounter  Medications  .  estradiol (ESTRACE VAGINAL) 0.1 MG/GM vaginal cream    Sig: INSERT 1 VAGINALLY AT BEDTIME    Dispense:  42.5 g    Refill:  11  . fluticasone (FLONASE) 50 MCG/ACT nasal spray    Sig: 1 SPRAY IN EACH NOSTRIL EVERY 12 HOURS    Dispense:  48 g    Refill:  3   Patient Instructions                       Medicare Annual Wellness Visit  [facility] and the medical providers at [facility] strive to bring you the best medical care.  In doing so we not only want to address your current medical conditions and concerns but also to detect new conditions early and prevent illness, disease and health-related problems.    Medicare offers a yearly Wellness Visit  which allows our clinical staff to assess your need for preventative services including immunizations, lifestyle education, counseling to decrease risk of preventable diseases and screening for fall risk and other medical concerns.    This visit is provided free of charge (no copay) for all Medicare recipients. The clinical pharmacists at [facility] have begun to conduct these Wellness Visits which will also include a thorough review of all your medications.    As you primary medical provider recommend that you make an appointment for your Annual Wellness Visit if you have not done so already this year.  You may set up this appointment before you leave today or you may call back [phone] and schedule an appointment.  Please make sure when you call that you mention that you are scheduling your Annual Wellness Visit with the clinical pharmacist so that the appointment may be made for the proper length of time.     Continue current medications. Continue good therapeutic lifestyle changes which include good diet and exercise. Fall precautions discussed with patient. If an FOBT was given today- please return it to our front desk. If you are over 3 years old - you may need Prevnar 74 or the adult Pneumonia  vaccine.  **Flu shots are available--- please call and schedule a FLU-CLINIC appointment**  After your visit with [place] today you will receive a survey in the mail or online from [employer] regarding your care with [place]. Please take a moment to fill this out. Your feedback is very important to [place] as you can help [place] better understand your patient needs as well as improve your experience and satisfaction. WE CARE ABOUT YOU!!!   Take ibuprofen as needed for left foot pain Wear good shoes Continue to take current medications If you develop problems with ear cerumen. You can use Debrox which is over-the-counter to help soften the earwax so that it will come out of the ear more easily  Arrie Senate MD

## 2015-12-26 ENCOUNTER — Other Ambulatory Visit: Payer: Self-pay | Admitting: Family Medicine

## 2015-12-26 DIAGNOSIS — Z1231 Encounter for screening mammogram for malignant neoplasm of breast: Secondary | ICD-10-CM

## 2015-12-29 ENCOUNTER — Other Ambulatory Visit: Payer: Medicare Other

## 2015-12-29 DIAGNOSIS — Z1211 Encounter for screening for malignant neoplasm of colon: Secondary | ICD-10-CM | POA: Diagnosis not present

## 2015-12-31 LAB — FECAL OCCULT BLOOD, IMMUNOCHEMICAL: Fecal Occult Bld: NEGATIVE

## 2016-01-14 ENCOUNTER — Ambulatory Visit (INDEPENDENT_AMBULATORY_CARE_PROVIDER_SITE_OTHER): Payer: Medicare Other | Admitting: Pediatrics

## 2016-01-14 ENCOUNTER — Encounter: Payer: Self-pay | Admitting: Pediatrics

## 2016-01-14 VITALS — BP 116/66 | HR 67 | Temp 98.8°F | Ht 65.5 in | Wt 117.2 lb

## 2016-01-14 DIAGNOSIS — B9789 Other viral agents as the cause of diseases classified elsewhere: Principal | ICD-10-CM

## 2016-01-14 DIAGNOSIS — J069 Acute upper respiratory infection, unspecified: Secondary | ICD-10-CM

## 2016-01-14 MED ORDER — GUAIFENESIN-CODEINE 100-10 MG/5ML PO SOLN: 5.0000 mL | Freq: Three times a day (TID) | ORAL | 0 refills | Status: DC | PRN

## 2016-01-14 NOTE — Patient Instructions (Signed)
Netipot with distilled water 2-3 times a day to clear out sinuses Or Normal saline nasal spray Flonase steroid nasal spray Lots of fluids   

## 2016-01-14 NOTE — Progress Notes (Signed)
  Subjective:   Patient ID: Kayla Randolph, female    DOB: [birth year], 70 y.o.   MRN: [id number] CC: Fever; Fatigue; and Laryngitis  HPI: Kayla Randolph is a 70 y.o. female presenting for Fever; Fatigue; and Laryngitis  Started having throat hoarseness and coughing at night 4 days ago Has been taking cough syrup with codeine in it Last two days some acheyness Feeling tired Drainage down back of throat flonase daily  Relevant past medical, surgical, family and social history reviewed. Allergies and medications reviewed and updated. History  Smoking Status  . Never Smoker  Smokeless Tobacco  . Never Used   ROS: Per HPI   Objective:    BP 116/66   Pulse 67   Temp 98.8 F (37.1 C) (Oral)   Ht 5' 5.5" (1.664 m)   Wt 117 lb 3.2 oz (53.2 kg)   BMI 19.21 kg/m   Wt Readings from Last 3 Encounters:  01/14/16 117 lb 3.2 oz (53.2 kg)  12/25/15 115 lb (52.2 kg)  08/11/15 117 lb (53.1 kg)    Gen: NAD, alert, cooperative with exam, NCAT, slightly hoarse, clearing throat often EYES: EOMI, no conjunctival injection, or no icterus ENT:  TMs pearly gray b/l, OP without erythema LYMPH: no cervical LAD CV: NRRR, normal S1/S2, no murmur, distal pulses 2+ b/l Resp: CTABL, no wheezes, normal WOB Ext: No edema, warm Neuro: Alert and oriented  Assessment & Plan:  Nino was seen today for fever, fatigue and laryngitis, ongoing 3-4 days, likely due to virus. Discussed symptomatic care Use cough syrup with codeine as needed, not with driving  Diagnoses and all orders for this visit:  Viral URI with cough -     guaiFENesin-codeine 100-10 MG/5ML syrup; Take 5-10 mLs by mouth 3 (three) times daily as needed for cough.   Follow up plan: As needed Assunta Found, MD [place]

## 2016-01-27 ENCOUNTER — Ambulatory Visit
Admission: RE | Admit: 2016-01-27 | Discharge: 2016-01-27 | Disposition: A | Discharge status: Home / Self Care | Payer: Medicare Other | Source: Ambulatory Visit | Attending: Family Medicine | Admitting: Family Medicine

## 2016-01-27 DIAGNOSIS — D239 Other benign neoplasm of skin, unspecified: Secondary | ICD-10-CM | POA: Diagnosis not present

## 2016-01-27 DIAGNOSIS — Z1231 Encounter for screening mammogram for malignant neoplasm of breast: Secondary | ICD-10-CM | POA: Diagnosis not present

## 2016-01-27 DIAGNOSIS — L821 Other seborrheic keratosis: Secondary | ICD-10-CM | POA: Diagnosis not present

## 2016-01-27 DIAGNOSIS — L859 Epidermal thickening, unspecified: Secondary | ICD-10-CM | POA: Diagnosis not present

## 2016-03-18 ENCOUNTER — Ambulatory Visit (INDEPENDENT_AMBULATORY_CARE_PROVIDER_SITE_OTHER): Payer: Medicare Other

## 2016-03-18 DIAGNOSIS — Z23 Encounter for immunization: Secondary | ICD-10-CM

## 2016-04-16 ENCOUNTER — Other Ambulatory Visit: Payer: Medicare Other

## 2016-04-16 DIAGNOSIS — Z0000 Encounter for general adult medical examination without abnormal findings: Secondary | ICD-10-CM | POA: Diagnosis not present

## 2016-04-16 DIAGNOSIS — E559 Vitamin D deficiency, unspecified: Secondary | ICD-10-CM | POA: Diagnosis not present

## 2016-04-16 DIAGNOSIS — E7800 Pure hypercholesterolemia, unspecified: Secondary | ICD-10-CM | POA: Diagnosis not present

## 2016-04-17 LAB — CBC WITH DIFFERENTIAL/PLATELET
Basophils Absolute: 0 10*3/uL (ref 0.0–0.2)
Basos: 0 %
EOS (ABSOLUTE): 0.2 10*3/uL (ref 0.0–0.4)
Eos: 3 %
Hematocrit: 44.5 % (ref 34.0–46.6)
Hemoglobin: 14.6 g/dL (ref 11.1–15.9)
Immature Grans (Abs): 0 10*3/uL (ref 0.0–0.1)
Immature Granulocytes: 0 %
Lymphocytes Absolute: 1.2 10*3/uL (ref 0.7–3.1)
Lymphs: 22 %
MCH: 30.7 pg (ref 26.6–33.0)
MCHC: 32.8 g/dL (ref 31.5–35.7)
MCV: 94 fL (ref 79–97)
Monocytes Absolute: 0.5 10*3/uL (ref 0.1–0.9)
Monocytes: 9 %
Neutrophils Absolute: 3.5 10*3/uL (ref 1.4–7.0)
Neutrophils: 66 %
Platelets: 190 10*3/uL (ref 150–379)
RBC: 4.76 x10E6/uL (ref 3.77–5.28)
RDW: 14.5 % (ref 12.3–15.4)
WBC: 5.4 10*3/uL (ref 3.4–10.8)

## 2016-04-17 LAB — HEPATIC FUNCTION PANEL
ALT: 17 IU/L (ref 0–32)
AST: 30 IU/L (ref 0–40)
Albumin: 4.1 g/dL (ref 3.5–4.8)
Alkaline Phosphatase: 49 IU/L (ref 39–117)
Bilirubin Total: 0.3 mg/dL (ref 0.0–1.2)
Bilirubin, Direct: 0.12 mg/dL (ref 0.00–0.40)
Total Protein: 6.3 g/dL (ref 6.0–8.5)

## 2016-04-17 LAB — BMP8+EGFR
BUN/Creatinine Ratio: 26 (ref 12–28)
BUN: 17 mg/dL (ref 8–27)
CO2: 26 mmol/L (ref 18–29)
Calcium: 8.9 mg/dL (ref 8.7–10.3)
Chloride: 101 mmol/L (ref 96–106)
Creatinine, Ser: 0.66 mg/dL (ref 0.57–1.00)
GFR calc Af Amer: 103 mL/min/{1.73_m2} (ref >59)
GFR calc non Af Amer: 89 mL/min/{1.73_m2} (ref >59)
Glucose: 82 mg/dL (ref 65–99)
Potassium: 4.4 mmol/L (ref 3.5–5.2)
Sodium: 144 mmol/L (ref 134–144)

## 2016-04-17 LAB — NMR, LIPOPROFILE
Cholesterol: 207 mg/dL — ABNORMAL HIGH (ref 100–199)
HDL Cholesterol by NMR: 79 mg/dL (ref >39)
HDL Particle Number: 41.1 umol/L (ref >=30.5)
LDL Particle Number: 1146 nmol/L — ABNORMAL HIGH (ref <1000)
LDL Size: 21.7 nm (ref >20.5)
LDL-C: 112 mg/dL — ABNORMAL HIGH (ref 0–99)
LP-IR Score: <25 (ref <=45)
Small LDL Particle Number: 198 nmol/L (ref <=527)
Triglycerides by NMR: 80 mg/dL (ref 0–149)

## 2016-04-17 LAB — VITAMIN D 25 HYDROXY (VIT D DEFICIENCY, FRACTURES): Vit D, 25-Hydroxy: 75.1 ng/mL (ref 30.0–100.0)

## 2016-04-21 ENCOUNTER — Ambulatory Visit (INDEPENDENT_AMBULATORY_CARE_PROVIDER_SITE_OTHER): Payer: Medicare Other | Admitting: Family Medicine

## 2016-04-21 ENCOUNTER — Encounter: Payer: Self-pay | Admitting: Family Medicine

## 2016-04-21 ENCOUNTER — Ambulatory Visit (INDEPENDENT_AMBULATORY_CARE_PROVIDER_SITE_OTHER): Payer: Medicare Other

## 2016-04-21 VITALS — BP 125/79 | HR 66 | Temp 97.8°F | Ht 65.5 in | Wt 117.0 lb

## 2016-04-21 DIAGNOSIS — M25561 Pain in right knee: Secondary | ICD-10-CM | POA: Diagnosis not present

## 2016-04-21 DIAGNOSIS — E559 Vitamin D deficiency, unspecified: Secondary | ICD-10-CM

## 2016-04-21 DIAGNOSIS — M25562 Pain in left knee: Secondary | ICD-10-CM | POA: Diagnosis not present

## 2016-04-21 DIAGNOSIS — E7800 Pure hypercholesterolemia, unspecified: Secondary | ICD-10-CM | POA: Diagnosis not present

## 2016-04-21 DIAGNOSIS — M25551 Pain in right hip: Secondary | ICD-10-CM

## 2016-04-21 DIAGNOSIS — G8929 Other chronic pain: Secondary | ICD-10-CM | POA: Diagnosis not present

## 2016-04-21 DIAGNOSIS — M25552 Pain in left hip: Secondary | ICD-10-CM

## 2016-04-21 DIAGNOSIS — Z780 Asymptomatic menopausal state: Secondary | ICD-10-CM

## 2016-04-21 DIAGNOSIS — Z13820 Encounter for screening for osteoporosis: Secondary | ICD-10-CM

## 2016-04-21 MED ORDER — ROSUVASTATIN CALCIUM 5 MG PO TABS: 5.0000 mg | ORAL_TABLET | Freq: Every day | ORAL | 3 refills | Status: DC

## 2016-04-21 NOTE — Progress Notes (Signed)
Subjective:    Patient ID: Kayla Randolph, female    DOB: Sep 26, 1945, 70 y.o.   MRN: [id number]  HPI Pt here for follow up and management of chronic medical problems which includes hyperlipidemia. She is taking medications regularly.The patient is doing well overall she does complain of some general arthralgias which has been part of her complaint in the past because of weird care arthritic symptoms. She is due to get a DEXA scan and this will be done today. She has had her lab work done and this will be reviewed with her during the visit today. All of her lab work was good including kidney function blood sugar hemoglobin liver function test and vitamin D. Her cholesterol numbers with advanced lipid testing still haven't elevated LDL particle number. Her father did die of an early age of a heart attack. We might would like to consider asking her to take a low dose of Crestor 3 days a week to see if this will help bring her numbers down anymore. Over she will be willing to do that. The patient is pleasant and alert. She does have a general arthralgias and occasionally does take ibuprofen and Tylenol. They're no worse than in the past. She denies any chest pain palpitations pressure shortness of breath. She does not have any trouble with swallowing heartburn indigestion nausea vomiting diarrhea or blood in the stool. She still has her irritable bowel syndrome and this is an ongoing problem especially causing a lot of bloating and gas. She understands to avoid milk cheese ice cream and dairy products as much as possible. She is passing her water without problems and she is up-to-date on her pelvic exams. She she will get a DEXA scan and we did review the lab work and she had no questions regarding that.     Patient Active Problem List   Diagnosis Date Noted  . Depression 04/03/2013  . Hyperlipemia 12/14/2012  . Vitamin D deficiency 12/14/2012  . Allergic rhinitis 12/14/2012  . Cervical spine arthritis  ([place]) 12/14/2012  . 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 Osteopenia    Outpatient Encounter Prescriptions as of 04/21/2016  Medication Sig  . aspirin 81 MG tablet Take 81 mg by mouth daily.    . Cholecalciferol (VITAMIN D3) 1000 UNITS CAPS Take 2 capsules by mouth daily.   . cycloSPORINE (RESTASIS) 0.05 % ophthalmic emulsion Place 1 drop into both eyes 2 (two) times daily.  Marland Kitchen escitalopram (LEXAPRO) 10 MG tablet TAKE (1) TABLET DAILY AS DIRECTED.  Marland Kitchen estradiol (ESTRACE VAGINAL) 0.1 MG/GM vaginal cream INSERT 1 VAGINALLY AT BEDTIME  . fluticasone (FLONASE) 50 MCG/ACT nasal spray 1 SPRAY IN EACH NOSTRIL EVERY 12 HOURS  . meloxicam (MOBIC) 15 MG tablet Take one half to one daily after eating as directed  . Polyethylene Glycol 3350 (MIRALAX PO) Take 1 packet by mouth daily as needed.    Marland Kitchen Specialty Vitamins Products (ONE-A-DAY BONE STRENGTH) 500-28-100 MG-MG-UNIT TABS Take 1 tablet by mouth 3 (three) times daily.  . [DISCONTINUED] guaiFENesin-codeine 100-10 MG/5ML syrup Take 5-10 mLs by mouth 3 (three) times daily as needed for cough.   No facility-administered encounter medications on file as of 04/21/2016.      Review of Systems  Constitutional: Negative.   HENT: Negative.   Eyes: Negative.   Respiratory: Negative.   Cardiovascular: Negative.   Gastrointestinal: Negative.   Endocrine: Negative.   Genitourinary: Negative.   Musculoskeletal: Positive for arthralgias (general ).  Skin: Negative.   Allergic/Immunologic:  Negative.   Neurological: Negative.   Hematological: Negative.   Psychiatric/Behavioral: Negative.        Objective:   Physical Exam  Constitutional: She is oriented to person, place, and time. She appears well-developed and well-nourished. No distress.  The patient is pleasant and alert  HENT:  Head: Normocephalic and atraumatic.  Right Ear: External ear normal.  Left Ear: External ear normal.  Nose: Nose normal.  Mouth/Throat: Oropharynx is clear and moist.    Eyes: Conjunctivae and EOM are normal. Pupils are equal, round, and reactive to light. Right eye exhibits no discharge. Left eye exhibits no discharge. No scleral icterus.  Neck: Normal range of motion. Neck supple. No thyromegaly present.  Cardiovascular: Normal rate, regular rhythm, normal heart sounds and intact distal pulses.   No murmur heard. The heart has a regular rate and rhythm at 72/m  Pulmonary/Chest: Effort normal and breath sounds normal. No respiratory distress. She has no wheezes. She has no rales.  Clear anteriorly and posteriorly  Abdominal: Soft. Bowel sounds are normal. She exhibits no mass. There is no tenderness. There is no rebound and no guarding.  No masses tenderness or organ enlargement or bruits  Musculoskeletal: Normal range of motion. She exhibits no edema.  Lymphadenopathy:    She has no cervical adenopathy.  Neurological: She is alert and oriented to person, place, and time. She has normal reflexes. No cranial nerve deficit.  Skin: Skin is warm and dry. No rash noted.  Psychiatric: She has a normal mood and affect. Her behavior is normal. Judgment and thought content normal.  Nursing note and vitals reviewed.  BP 125/79 (BP Location: Left Arm)   Pulse 66   Temp 97.8 F (36.6 C) (Oral)   Ht 5' 5.5" (1.664 m)   Wt 117 lb (53.1 kg)   BMI 19.17 kg/m         Assessment & Plan:  1. Pure hypercholesterolemia -The patient will start Crestor 5 mg 1/2-1 daily on Monday Wednesday and Friday only she will continue with aggressive therapeutic lifestyle changes which include his diet and exercise  2. Vitamin D deficiency -Continue with vitamin D as vitamin D levels were excellent - DG [facility] DEXA; Future  3. Screening for osteoporosis - DG [facility] DEXA; Future  4. Postmenopausal - DG [facility] DEXA; Future  5. Chronic arthralgias of knees and hips -Take Tylenol as needed for ibuprofen or Aleve as needed and always take this after eating. The arthralgias are  generalized and not just in the knees and hips.  . Patient Instructions                       Medicare Annual Wellness Visit  [facility] and the medical providers at [facility] strive to bring you the best medical care.  In doing so we not only want to address your current medical conditions and concerns but also to detect new conditions early and prevent illness, disease and health-related problems.    Medicare offers a yearly Wellness Visit which allows our clinical staff to assess your need for preventative services including immunizations, lifestyle education, counseling to decrease risk of preventable diseases and screening for fall risk and other medical concerns.    This visit is provided free of charge (no copay) for all Medicare recipients. The clinical pharmacists at [facility] have begun to conduct these Wellness Visits which will also include a thorough review of all your medications.  As you primary medical provider recommend that you make an appointment for your Annual Wellness Visit if you have not done so already this year.  You may set up this appointment before you leave today or you may call back [phone] and schedule an appointment.  Please make sure when you call that you mention that you are scheduling your Annual Wellness Visit with the clinical pharmacist so that the appointment may be made for the proper length of time.     Continue current medications. Continue good therapeutic lifestyle changes which include good diet and exercise. Fall precautions discussed with patient. If an FOBT was given today- please return it to our front desk. If you are over 77 years old - you may need Prevnar 22 or the adult Pneumonia vaccine.  **Flu shots are available--- please call and schedule a FLU-CLINIC appointment**  After your visit with [place] today you will receive a survey in the mail or online from [employer] regarding your care  with [place]. Please take a moment to fill this out. Your feedback is very important to [place] as you can help [place] better understand your patient needs as well as improve your experience and satisfaction. WE CARE ABOUT YOU!!!  Continue to watch diet closely and walk and exercise regularly being careful not to fall Continue with calcium and vitamin D Take Crestor 2 and half to 5 mg on Monday Wednesday and Friday only      Arrie Senate MD

## 2016-04-21 NOTE — Addendum Note (Signed)
Addended by: Zannie Cove on: 04/21/2016 12:00 PM   Modules accepted: Orders

## 2016-04-21 NOTE — Patient Instructions (Addendum)
Medicare Annual Wellness Visit  [facility] and the medical providers at [facility] strive to bring you the best medical care.  In doing so we not only want to address your current medical conditions and concerns but also to detect new conditions early and prevent illness, disease and health-related problems.    Medicare offers a yearly Wellness Visit which allows our clinical staff to assess your need for preventative services including immunizations, lifestyle education, counseling to decrease risk of preventable diseases and screening for fall risk and other medical concerns.    This visit is provided free of charge (no copay) for all Medicare recipients. The clinical pharmacists at [facility] have begun to conduct these Wellness Visits which will also include a thorough review of all your medications.    As you primary medical provider recommend that you make an appointment for your Annual Wellness Visit if you have not done so already this year.  You may set up this appointment before you leave today or you may call back [phone] and schedule an appointment.  Please make sure when you call that you mention that you are scheduling your Annual Wellness Visit with the clinical pharmacist so that the appointment may be made for the proper length of time.     Continue current medications. Continue good therapeutic lifestyle changes which include good diet and exercise. Fall precautions discussed with patient. If an FOBT was given today- please return it to our front desk. If you are over 69 years old - you may need Prevnar 89 or the adult Pneumonia vaccine.  **Flu shots are available--- please call and schedule a FLU-CLINIC appointment**  After your visit with [place] today you will receive a survey in the mail or online from [employer] regarding your care with [place]. Please take a moment to fill this out. Your feedback is very  important to [place] as you can help [place] better understand your patient needs as well as improve your experience and satisfaction. WE CARE ABOUT YOU!!!  Continue to watch diet closely and walk and exercise regularly being careful not to fall Continue with calcium and vitamin D Take Crestor 2 and half to 5 mg on Monday Wednesday and Friday only

## 2016-06-24 DIAGNOSIS — H52203 Unspecified astigmatism, bilateral: Secondary | ICD-10-CM | POA: Diagnosis not present

## 2016-06-24 DIAGNOSIS — H5213 Myopia, bilateral: Secondary | ICD-10-CM | POA: Diagnosis not present

## 2016-06-24 DIAGNOSIS — H524 Presbyopia: Secondary | ICD-10-CM | POA: Diagnosis not present

## 2016-06-24 DIAGNOSIS — H2513 Age-related nuclear cataract, bilateral: Secondary | ICD-10-CM | POA: Diagnosis not present

## 2016-08-31 ENCOUNTER — Other Ambulatory Visit: Payer: Medicare Other

## 2016-08-31 DIAGNOSIS — Z0000 Encounter for general adult medical examination without abnormal findings: Secondary | ICD-10-CM | POA: Diagnosis not present

## 2016-08-31 DIAGNOSIS — E7800 Pure hypercholesterolemia, unspecified: Secondary | ICD-10-CM

## 2016-08-31 DIAGNOSIS — E559 Vitamin D deficiency, unspecified: Secondary | ICD-10-CM | POA: Diagnosis not present

## 2016-09-01 LAB — BMP8+EGFR
BUN/Creatinine Ratio: 16 (ref 12–28)
BUN: 12 mg/dL (ref 8–27)
CO2: 28 mmol/L (ref 18–29)
Calcium: 9.4 mg/dL (ref 8.7–10.3)
Chloride: 103 mmol/L (ref 96–106)
Creatinine, Ser: 0.76 mg/dL (ref 0.57–1.00)
GFR calc Af Amer: 91 mL/min/{1.73_m2} (ref >59)
GFR calc non Af Amer: 79 mL/min/{1.73_m2} (ref >59)
Glucose: 82 mg/dL (ref 65–99)
Potassium: 4.1 mmol/L (ref 3.5–5.2)
Sodium: 146 mmol/L — ABNORMAL HIGH (ref 134–144)

## 2016-09-01 LAB — CBC WITH DIFFERENTIAL/PLATELET
Basophils Absolute: 0 10*3/uL (ref 0.0–0.2)
Basos: 1 %
EOS (ABSOLUTE): 0.2 10*3/uL (ref 0.0–0.4)
Eos: 4 %
Hematocrit: 44.2 % (ref 34.0–46.6)
Hemoglobin: 14.2 g/dL (ref 11.1–15.9)
Immature Grans (Abs): 0 10*3/uL (ref 0.0–0.1)
Immature Granulocytes: 0 %
Lymphocytes Absolute: 1.2 10*3/uL (ref 0.7–3.1)
Lymphs: 24 %
MCH: 29.4 pg (ref 26.6–33.0)
MCHC: 32.1 g/dL (ref 31.5–35.7)
MCV: 92 fL (ref 79–97)
Monocytes Absolute: 0.7 10*3/uL (ref 0.1–0.9)
Monocytes: 14 %
Neutrophils Absolute: 2.8 10*3/uL (ref 1.4–7.0)
Neutrophils: 57 %
Platelets: 187 10*3/uL (ref 150–379)
RBC: 4.83 x10E6/uL (ref 3.77–5.28)
RDW: 14.5 % (ref 12.3–15.4)
WBC: 5 10*3/uL (ref 3.4–10.8)

## 2016-09-01 LAB — HEPATIC FUNCTION PANEL
ALT: 13 IU/L (ref 0–32)
AST: 25 IU/L (ref 0–40)
Albumin: 4.3 g/dL (ref 3.5–4.8)
Alkaline Phosphatase: 47 IU/L (ref 39–117)
Bilirubin Total: 0.3 mg/dL (ref 0.0–1.2)
Bilirubin, Direct: 0.1 mg/dL (ref 0.00–0.40)
Total Protein: 6.2 g/dL (ref 6.0–8.5)

## 2016-09-01 LAB — NMR, LIPOPROFILE
Cholesterol: 158 mg/dL (ref 100–199)
HDL Cholesterol by NMR: 73 mg/dL (ref >39)
HDL Particle Number: 41.7 umol/L (ref >=30.5)
LDL Particle Number: 895 nmol/L (ref <1000)
LDL Size: 21.5 nm (ref >20.5)
LDL-C: 69 mg/dL (ref 0–99)
LP-IR Score: 27 (ref <=45)
Small LDL Particle Number: 170 nmol/L (ref <=527)
Triglycerides by NMR: 80 mg/dL (ref 0–149)

## 2016-09-01 LAB — VITAMIN D 25 HYDROXY (VIT D DEFICIENCY, FRACTURES): Vit D, 25-Hydroxy: 62.9 ng/mL (ref 30.0–100.0)

## 2016-09-03 ENCOUNTER — Ambulatory Visit (INDEPENDENT_AMBULATORY_CARE_PROVIDER_SITE_OTHER): Payer: Medicare Other | Admitting: Family Medicine

## 2016-09-03 ENCOUNTER — Encounter: Payer: Self-pay | Admitting: Family Medicine

## 2016-09-03 VITALS — BP 110/60 | HR 68 | Temp 98.4°F | Ht 65.5 in | Wt 119.0 lb

## 2016-09-03 DIAGNOSIS — E7800 Pure hypercholesterolemia, unspecified: Secondary | ICD-10-CM | POA: Diagnosis not present

## 2016-09-03 DIAGNOSIS — H6121 Impacted cerumen, right ear: Secondary | ICD-10-CM | POA: Diagnosis not present

## 2016-09-03 DIAGNOSIS — E559 Vitamin D deficiency, unspecified: Secondary | ICD-10-CM | POA: Diagnosis not present

## 2016-09-03 NOTE — Progress Notes (Signed)
Subjective:    Patient ID: Kayla Randolph, female    DOB: [birth year], 71 y.o.   MRN: [id number]  HPI Patient is here today for her 4 month follow up on hyperlipidemia. Patient states she has also been having some trouble with bloating due to her IBS.The patient is doing well overall. She does complain of some bloating and does have a history of irritable bowel syndrome. She's had her lab work done and we will review this with her during the visit today. Her vitamin D level was excellent at 62.9 and she will continue with current treatment the CBC had a normal white blood cell count a good and stable hemoglobin and adequate platelet count. The blood sugar was good at 82 and the creatinine was normal. The electrolytes had a serum sodium that was slightly elevated and we will continue to monitor that. All liver function tests were normal. Cholesterol numbers with advanced lipid testing were greatly improved and this was with taking a very low dose of Crestor weekly. The LDL particle number was 895. LDL C was good at 69 and also greatly improved from the previous one of her 112. The patient denies any chest pain or shortness of breath. She does have issues with bloating and associates this with her irritable bowel syndrome. Sometimes foods bother her and sometimes they don't. She is aware that she needs to watch more carefully her milk cheese ice cream and dairy products. She does take a probiotic regularly. She has not seen any blood in the stool or had any black tarry bowel movements. She did have a colonoscopy in 2013 that she thinks was normal. She has no trouble passing her water.    Review of Systems  Constitutional: Negative.   HENT: Negative.   Eyes: Negative.   Respiratory: Negative.   Cardiovascular: Negative.   Gastrointestinal:       Bloating   Endocrine: Negative.   Genitourinary: Negative.   Musculoskeletal: Negative.   Skin: Negative.   Allergic/Immunologic: Negative.     Neurological: Negative.   Hematological: Negative.   Psychiatric/Behavioral: Negative.          Patient Active Problem List   Diagnosis Date Noted  . Depression 04/03/2013  . Hyperlipemia 12/14/2012  . Vitamin D deficiency 12/14/2012  . Allergic rhinitis 12/14/2012  . Cervical spine arthritis ([place]) 12/14/2012  . 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 Osteopenia    Outpatient Encounter Prescriptions as of 09/03/2016  Medication Sig  . aspirin 81 MG tablet Take 81 mg by mouth daily.    . Cholecalciferol (VITAMIN D3) 1000 UNITS CAPS Take 2 capsules by mouth daily.   . cycloSPORINE (RESTASIS) 0.05 % ophthalmic emulsion Place 1 drop into both eyes 2 (two) times daily.  Marland Kitchen escitalopram (LEXAPRO) 10 MG tablet TAKE (1) TABLET DAILY AS DIRECTED.  Marland Kitchen estradiol (ESTRACE VAGINAL) 0.1 MG/GM vaginal cream INSERT 1 VAGINALLY AT BEDTIME  . fluticasone (FLONASE) 50 MCG/ACT nasal spray 1 SPRAY IN EACH NOSTRIL EVERY 12 HOURS  . meloxicam (MOBIC) 15 MG tablet Take one half to one daily after eating as directed  . Polyethylene Glycol 3350 (MIRALAX PO) Take 1 packet by mouth daily as needed.    . rosuvastatin (CRESTOR) 5 MG tablet Take 1 tablet (5 mg total) by mouth daily. As directed  . Specialty Vitamins Products (ONE-A-DAY BONE STRENGTH) 500-28-100 MG-MG-UNIT TABS Take 1 tablet by mouth 3 (three) times daily.   No facility-administered encounter medications on file as of 09/03/2016.  Objective:   Physical Exam  Constitutional: She is oriented to person, place, and time. She appears well-developed and well-nourished. No distress.  The patient is pleasant and alert  HENT:  Head: Normocephalic and atraumatic.  Right Ear: External ear normal.  Left Ear: External ear normal.  Mouth/Throat: Oropharynx is clear and moist.  Congestion and turbinate swelling bilaterally  Eyes: Conjunctivae and EOM are normal. Pupils are equal, round, and reactive to light. Right eye exhibits no discharge. Left eye  exhibits no discharge. No scleral icterus.  Neck: Normal range of motion. Neck supple. No thyromegaly present.  Cardiovascular: Normal rate, regular rhythm, normal heart sounds and intact distal pulses.   No murmur heard. Heart is regular at 72/m  Pulmonary/Chest: Effort normal and breath sounds normal. No respiratory distress. She has no wheezes. She has no rales.  Clear anteriorly and posteriorly  Abdominal: Soft. Bowel sounds are normal. She exhibits no mass. There is no tenderness. There is no rebound and no guarding.  No abdominal tenderness masses or organ enlargement or bruits no inguinal adenopathy  Musculoskeletal: Normal range of motion. She exhibits no edema.  Lymphadenopathy:    She has no cervical adenopathy.  Neurological: She is alert and oriented to person, place, and time. She has normal reflexes. No cranial nerve deficit.  Skin: Skin is warm and dry. No rash noted.  Psychiatric: She has a normal mood and affect. Her behavior is normal. Judgment and thought content normal.  Nursing note and vitals reviewed.  BP 110/60   Pulse 68   Temp 98.4 F (36.9 C) (Oral)   Ht 5' 5.5" (1.664 m)   Wt 119 lb (54 kg)   BMI 19.50 kg/m         Assessment & Plan:  1. Pure hypercholesterolemia -Continue with Crestor as currently doing taking 1 daily. This can be taken in the evening at bedtime are in the morning as you're currently doing. -Continue with aggressive therapeutic lifestyle changes  2. Vitamin D deficiency -The vitamin D level is good and she will continue with current treatment  3. Right ear impacted cerumen -Ear irrigation to remove impacted cerumen from the right ear canal  4. IBS -Continue with probiotic and avoiding foods that knowingly will cause more gas and bloating -Drink plenty of fluids and stay well hydrated  Patient Instructions  Continue current medications. Continue good therapeutic lifestyle changes which include good diet and exercise. Fall  precautions discussed with patient. If an FOBT was given today- please return it to our front desk. If you are over 60 years old - you may need Prevnar 48 or the adult Pneumonia vaccine.  Flu Shots will be available at our office starting mid- September. Please call and schedule a FLU CLINIC APPOINTMENT.  Continue to follow-up with dermatology regularly for skin screens Continue with Crestor as you're currently doing Continue with aggressive therapeutic lifestyle changes Continue with probiotic Continue to drink plenty of fluids and stay well hydrated    Arrie Senate MD

## 2016-09-03 NOTE — Patient Instructions (Addendum)
Continue current medications. Continue good therapeutic lifestyle changes which include good diet and exercise. Fall precautions discussed with patient. If an FOBT was given today- please return it to our front desk. If you are over 72 years old - you may need Prevnar 53 or the adult Pneumonia vaccine.  Flu Shots will be available at our office starting mid- September. Please call and schedule a FLU CLINIC APPOINTMENT.  Continue to follow-up with dermatology regularly for skin screens Continue with Crestor as you're currently doing Continue with aggressive therapeutic lifestyle changes Continue with probiotic Continue to drink plenty of fluids and stay well hydrated

## 2016-10-05 DIAGNOSIS — D229 Melanocytic nevi, unspecified: Secondary | ICD-10-CM | POA: Diagnosis not present

## 2016-10-05 DIAGNOSIS — L821 Other seborrheic keratosis: Secondary | ICD-10-CM | POA: Diagnosis not present

## 2016-10-07 ENCOUNTER — Ambulatory Visit (INDEPENDENT_AMBULATORY_CARE_PROVIDER_SITE_OTHER): Payer: Medicare Other | Admitting: *Deleted

## 2016-10-07 VITALS — BP 118/72 | HR 58 | Temp 97.4°F | Ht 65.25 in | Wt 117.8 lb

## 2016-10-07 DIAGNOSIS — Z0000 Encounter for general adult medical examination without abnormal findings: Secondary | ICD-10-CM | POA: Diagnosis not present

## 2016-10-07 DIAGNOSIS — Z23 Encounter for immunization: Secondary | ICD-10-CM

## 2016-10-07 NOTE — Progress Notes (Signed)
Subjective:   Kayla Randolph is a 71 y.o. female who presents for an Initial Medicare Annual Wellness Visit.  Review of Systems    Patient is here today for her Initial Medicare Annual Wellness visit. Patient is a 71 year old female who lives in [place] with her husband. 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 She enjoys cross-word puzzles, cooking, reading watching tv, traveling, keeping up with current events and politics. She does goes to the gym and attends a yoga class weekly. She states that her diet is healthy. 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 She has 2 children. Fall hazards were discussed with patient as she does have stairs in the home, 2 inside cats and 1 rug which is slip resistant. She states that she feels her health is the same as it was last year at this time.      Objective:    Today's Vitals   10/07/16 1029  BP: 118/72  Pulse: (!) 58  Temp: 97.4 F (36.3 C)  TempSrc: Oral  Weight: 117 lb 12.8 oz (53.4 kg)  Height: 5' 5.25" (1.657 m)   Body mass index is 19.45 kg/m.   Current Medications (verified) Outpatient Encounter Prescriptions as of 10/07/2016  Medication Sig  . aspirin 81 MG tablet Take 81 mg by mouth daily.    . Black Pepper-Turmeric (TURMERIC COMPLEX/BLACK PEPPER PO) Take by mouth.  . Cholecalciferol (VITAMIN D3) 1000 UNITS CAPS Take 2 capsules by mouth daily.   . cycloSPORINE (RESTASIS) 0.05 % ophthalmic emulsion Place 1 drop into both eyes 2 (two) times daily.  Marland Kitchen escitalopram (LEXAPRO) 10 MG tablet TAKE (1) TABLET DAILY AS DIRECTED.  Marland Kitchen estradiol (ESTRACE VAGINAL) 0.1 MG/GM vaginal cream INSERT 1 VAGINALLY AT BEDTIME  . fluticasone (FLONASE) 50 MCG/ACT nasal spray 1 SPRAY IN EACH NOSTRIL EVERY 12 HOURS  . meloxicam (MOBIC)  15 MG tablet Take one half to one daily after eating as directed  . Multiple Vitamins-Minerals (WOMENS 50+ MULTI VITAMIN/MIN PO) Take by mouth.  . Omega-3 Fatty Acids (SUPER OMEGA-3 PO) Take by mouth.  . Polyethylene Glycol 3350 (MIRALAX PO) Take 1 packet by mouth daily as needed.    . rosuvastatin (CRESTOR) 5 MG tablet Take 1 tablet (5 mg total) by mouth daily. As directed  . Specialty Vitamins Products (ONE-A-DAY BONE STRENGTH) 500-28-100 MG-MG-UNIT TABS Take 1 tablet by mouth 3 (three) times daily.  Marland Kitchen UNABLE TO FIND Med Name: Prelief Bladder   No facility-administered encounter medications on file as of 10/07/2016.     Allergies (verified) Macrodantin; Sulfa antibiotics; and Wellbutrin [bupropion hcl]   History: Past Medical History:  Diagnosis Date  . Allergy   . History of colon polyps   . Hyperlipidemia   . Interstitial cystitis    History of  . Osteopenia   . Vitamin D deficiency    Past Surgical History:  Procedure Laterality Date  . CESAREAN SECTION    . TONSILLECTOMY     Family History  Problem Relation Age of Onset  . Heart attack Father   . Osteoporosis Maternal Aunt   . Cancer Mother   . Early death Mother    Social History   Occupational History  . Not on file.  Social History Main Topics  . Smoking status: Never Smoker  . Smokeless tobacco: Never Used  . Alcohol use 3.5 oz/week    7 Standard drinks or equivalent per week     Comment: wine nightly  . Drug use: No  . Sexual activity: Yes    Tobacco Counseling Counseling given: Not Answered Patient has never been a smoker  Activities of Daily Living In your present state of health, do you have any difficulty performing the following activities: 10/07/2016  Hearing? Y  Vision? N  Difficulty concentrating or making decisions? N  Walking or climbing stairs? N  Dressing or bathing? N  Doing errands, shopping? N  Some recent data might be hidden  Patient states that it is hard to hear sometimes  due to background noise.  Immunizations and Health Maintenance Immunization History  Administered Date(s) Administered  . Influenza Whole 02/14/2010  . Influenza, High Dose Seasonal PF 03/18/2016  . Influenza,inj,Quad PF,36+ Mos 04/03/2013, 03/14/2014, 03/28/2015  . Pneumococcal Conjugate-13 05/15/2013  . Pneumococcal Polysaccharide-23 08/15/2009  . Tdap 05/18/2007  . Zoster 06/09/2006  . Zoster Recombinat (Shingrix) 10/07/2016   Health Maintenance Due  Topic Date Due  . Hepatitis C Screening  1945/08/13    Patient Care Team: Chipper Herb, MD as PCP - General (Family Medicine) Rutherford Guys, MD as Consulting Physician (Ophthalmology) Clarene Essex, MD as Consulting Physician (Gastroenterology)  Indicate any recent Medical Services you may have received from other than [facility] providers in the past year (date may be approximate).     Assessment:   This is a routine wellness examination for Kayla Randolph.   Hearing/Vision screen Patient states the it is sometimes difficult to hear due to background noise. Patient does wear glasses Dietary issues and exercise activities discussed:    Goals    . Exercise 3x per week (30 min per time)    . Have 3 meals a day      Depression Screen PHQ 2/9 Scores 10/07/2016 09/03/2016 04/21/2016 01/14/2016 12/25/2015 08/11/2015 06/16/2015  PHQ - 2 Score 0 0 0 0 0 0 0    Fall Risk Fall Risk  10/07/2016 09/03/2016 04/21/2016 01/14/2016 12/25/2015  Falls in the past year? Yes Yes Yes No No  Number falls in past yr: 2 or more 2 or more 1 - -  Injury with Fall? Yes Yes No - -  Risk Factor Category  High Fall Risk - - - -  Follow up Falls prevention discussed Falls prevention discussed - - -    Cognitive Function: MMSE - Mini Mental State Exam 10/07/2016  Orientation to time 5  Orientation to Place 5  Registration 3  Attention/ Calculation 5  Recall 2  Language- name 2 objects 2  Language- repeat 1  Language- follow 3 step command 3  Language- read &  follow direction 1  Write a sentence 1  Copy design 1  Total score 29  Patient scored a 29 out of 30      Screening Tests Health Maintenance  Topic Date Due  . Hepatitis C Screening  1946-01-22  . PAP SMEAR  12/01/2016  . INFLUENZA VACCINE  12/15/2016  . COLON CANCER SCREENING ANNUAL FOBT  12/28/2016  . TETANUS/TDAP  05/17/2017  . MAMMOGRAM  01/26/2018  . DEXA SCAN  Completed  . PNA vac Low Risk Adult  Completed      Plan:   Patient is to follow up with PCP as planned. Patient is due for EKG  I have personally reviewed  and noted the following in the patient's chart:   . Medical and social history . Use of alcohol, tobacco or illicit drugs  . Current medications and supplements . Functional ability and status . Nutritional status . Physical activity . Advanced directives . List of other physicians . Hospitalizations, surgeries, and ER visits in previous 12 months . Vitals . Screenings to include cognitive, depression, and falls . Referrals and appointments  In addition, I have reviewed and discussed with patient certain preventive protocols, quality metrics, and best practice recommendations. A written personalized care plan for preventive services as well as general preventive health recommendations were provided to patient.     Gareth Morgan, LPN   2/84/1324   Arrie Senate MD

## 2016-10-07 NOTE — Patient Instructions (Signed)
  Kayla Randolph , Thank you for taking time to come for your Medicare Wellness Visit. I appreciate your ongoing commitment to your health goals. Please review the following plan we discussed and let me know if I can assist you in the future.   These are the goals we discussed: Goals    . Exercise 3x per week (30 min per time)    . Have 3 meals a day       This is a list of the screening recommended for you and due dates:  Health Maintenance  Topic Date Due  .  Hepatitis C: One time screening is recommended by Center for Disease Control  (CDC) for  adults born from [birth year] through 1966.   18-Jun-1945  . Pap Smear  12/01/2016  . Flu Shot  12/15/2016  . Stool Blood Test  12/28/2016  . Tetanus Vaccine  05/17/2017  . Mammogram  01/26/2018  . DEXA scan (bone density measurement)  Completed  . Pneumonia vaccines  Completed

## 2016-10-08 ENCOUNTER — Telehealth: Payer: Self-pay | Admitting: *Deleted

## 2016-10-08 NOTE — Telephone Encounter (Signed)
See advice note

## 2016-11-29 ENCOUNTER — Other Ambulatory Visit: Payer: Self-pay | Admitting: Family Medicine

## 2016-12-22 ENCOUNTER — Other Ambulatory Visit: Payer: Self-pay | Admitting: Family Medicine

## 2016-12-22 DIAGNOSIS — Z1231 Encounter for screening mammogram for malignant neoplasm of breast: Secondary | ICD-10-CM

## 2016-12-31 ENCOUNTER — Other Ambulatory Visit: Payer: Self-pay | Admitting: Family Medicine

## 2017-01-01 ENCOUNTER — Encounter: Payer: Self-pay | Admitting: Family Medicine

## 2017-01-01 ENCOUNTER — Ambulatory Visit (INDEPENDENT_AMBULATORY_CARE_PROVIDER_SITE_OTHER): Payer: Medicare Other | Admitting: Family Medicine

## 2017-01-01 VITALS — BP 135/71 | HR 66 | Temp 98.2°F | Ht 65.25 in | Wt 115.0 lb

## 2017-01-01 DIAGNOSIS — W57XXXA Bitten or stung by nonvenomous insect and other nonvenomous arthropods, initial encounter: Secondary | ICD-10-CM | POA: Diagnosis not present

## 2017-01-01 DIAGNOSIS — S70362A Insect bite (nonvenomous), left thigh, initial encounter: Secondary | ICD-10-CM | POA: Diagnosis not present

## 2017-01-01 MED ORDER — DOXYCYCLINE HYCLATE 100 MG PO TABS: 100.0000 mg | ORAL_TABLET | Freq: Two times a day (BID) | ORAL | 0 refills | Status: DC

## 2017-01-01 NOTE — Progress Notes (Signed)
BP 135/71   Pulse 66   Temp 98.2 F (36.8 C) (Oral)   Ht 5' 5.25" (1.657 m)   Wt 115 lb (52.2 kg)   BMI 18.99 kg/m    Subjective:    Patient ID: Kayla Randolph, female    DOB: 1946/01/07, 71 y.o.   MRN: [id number]  HPI: Kayla Randolph is a 71 y.o. female presenting on 01/01/2017 for Found tick on back of left thigh   HPI Take bite on left hamstring Patient comes in today complaining of a tick bite on her left hamstring that she just pulled off of herself 2 hours ago. It is a very small tick and she did not know how long he had been in there. She denies any fevers or chills or redness or warmth or rash. She does have one small spot that is very pruritic where the tick was removed. She has never had Lyme disease in [facility] spotted fever before.  Relevant past medical, surgical, family and social history reviewed and updated as indicated. Interim medical history since our last visit reviewed. Allergies and medications reviewed and updated.  Review of Systems  Constitutional: Negative for chills and fever.  Eyes: Negative for visual disturbance.  Respiratory: Negative for chest tightness and shortness of breath.   Cardiovascular: Negative for chest pain and leg swelling.  Musculoskeletal: Negative for back pain and gait problem.  Skin: Negative for color change and rash.  Neurological: Negative for light-headedness and headaches.  Psychiatric/Behavioral: Negative for agitation and behavioral problems.  All other systems reviewed and are negative.   Per HPI unless specifically indicated above        Objective:    BP 135/71   Pulse 66   Temp 98.2 F (36.8 C) (Oral)   Ht 5' 5.25" (1.657 m)   Wt 115 lb (52.2 kg)   BMI 18.99 kg/m   Wt Readings from Last 3 Encounters:  01/01/17 115 lb (52.2 kg)  10/07/16 117 lb 12.8 oz (53.4 kg)  09/03/16 119 lb (54 kg)    Physical Exam  Constitutional: She is oriented to person, place, and time. She appears well-developed and  well-nourished. No distress.  Eyes: Conjunctivae are normal.  Cardiovascular: Normal rate, regular rhythm, normal heart sounds and intact distal pulses.   No murmur heard. Pulmonary/Chest: Effort normal and breath sounds normal. No respiratory distress. She has no wheezes. She has no rales.  Musculoskeletal: Normal range of motion.  Neurological: She is alert and oriented to person, place, and time. Coordination normal.  Skin: Skin is warm and dry. Lesion (Right hamstring small eschar, no other rash) noted. No rash noted. She is not diaphoretic.  Psychiatric: She has a normal mood and affect. Her behavior is normal.  Nursing note and vitals reviewed.     Assessment & Plan:   Problem List Items Addressed This Visit    None    Visit Diagnoses    Tick bite, initial encounter    -  Primary   Right hamstring area. No rash, small eschar, then doxycycline, patient has opted not to start it but will if symptoms arise   Relevant Medications   doxycycline (VIBRA-TABS) 100 MG tablet       Follow up plan: Return if symptoms worsen or fail to improve.  Counseling provided for all of the vaccine components No orders of the defined types were placed in this encounter.   Caryl Pina, MD [facility] 01/01/2017, 11:54 AM

## 2017-01-06 ENCOUNTER — Other Ambulatory Visit: Payer: Medicare Other

## 2017-01-06 DIAGNOSIS — E7800 Pure hypercholesterolemia, unspecified: Secondary | ICD-10-CM

## 2017-01-06 DIAGNOSIS — E559 Vitamin D deficiency, unspecified: Secondary | ICD-10-CM

## 2017-01-06 DIAGNOSIS — Z0000 Encounter for general adult medical examination without abnormal findings: Secondary | ICD-10-CM

## 2017-01-07 LAB — CBC WITH DIFFERENTIAL/PLATELET
Basophils Absolute: 0 10*3/uL (ref 0.0–0.2)
Basos: 0 %
EOS (ABSOLUTE): 0.3 10*3/uL (ref 0.0–0.4)
Eos: 4 %
Hematocrit: 41.4 % (ref 34.0–46.6)
Hemoglobin: 13.2 g/dL (ref 11.1–15.9)
Immature Grans (Abs): 0 10*3/uL (ref 0.0–0.1)
Immature Granulocytes: 0 %
Lymphocytes Absolute: 1.3 10*3/uL (ref 0.7–3.1)
Lymphs: 23 %
MCH: 29.8 pg (ref 26.6–33.0)
MCHC: 31.9 g/dL (ref 31.5–35.7)
MCV: 94 fL (ref 79–97)
Monocytes Absolute: 0.6 10*3/uL (ref 0.1–0.9)
Monocytes: 10 %
Neutrophils Absolute: 3.5 10*3/uL (ref 1.4–7.0)
Neutrophils: 63 %
Platelets: 192 10*3/uL (ref 150–379)
RBC: 4.43 x10E6/uL (ref 3.77–5.28)
RDW: 14.4 % (ref 12.3–15.4)
WBC: 5.7 10*3/uL (ref 3.4–10.8)

## 2017-01-07 LAB — NMR, LIPOPROFILE
Cholesterol: 144 mg/dL (ref 100–199)
HDL Cholesterol by NMR: 82 mg/dL (ref >39)
HDL Particle Number: 42.4 umol/L (ref >=30.5)
LDL Particle Number: 513 nmol/L (ref <1000)
LDL Size: 21 nm (ref >20.5)
LDL-C: 51 mg/dL (ref 0–99)
LP-IR Score: <25 (ref <=45)
Small LDL Particle Number: <90 nmol/L (ref <=527)
Triglycerides by NMR: 54 mg/dL (ref 0–149)

## 2017-01-07 LAB — BMP8+EGFR
BUN/Creatinine Ratio: 17 (ref 12–28)
BUN: 13 mg/dL (ref 8–27)
CO2: 27 mmol/L (ref 20–29)
Calcium: 9.3 mg/dL (ref 8.7–10.3)
Chloride: 104 mmol/L (ref 96–106)
Creatinine, Ser: 0.75 mg/dL (ref 0.57–1.00)
GFR calc Af Amer: 92 mL/min/{1.73_m2} (ref >59)
GFR calc non Af Amer: 80 mL/min/{1.73_m2} (ref >59)
Glucose: 77 mg/dL (ref 65–99)
Potassium: 4.4 mmol/L (ref 3.5–5.2)
Sodium: 145 mmol/L — ABNORMAL HIGH (ref 134–144)

## 2017-01-07 LAB — VITAMIN D 25 HYDROXY (VIT D DEFICIENCY, FRACTURES): Vit D, 25-Hydroxy: 62.4 ng/mL (ref 30.0–100.0)

## 2017-01-07 LAB — HEPATIC FUNCTION PANEL
ALT: 17 IU/L (ref 0–32)
AST: 27 IU/L (ref 0–40)
Albumin: 4.2 g/dL (ref 3.5–4.8)
Alkaline Phosphatase: 48 IU/L (ref 39–117)
Bilirubin Total: 0.3 mg/dL (ref 0.0–1.2)
Bilirubin, Direct: 0.11 mg/dL (ref 0.00–0.40)
Total Protein: 6.2 g/dL (ref 6.0–8.5)

## 2017-01-12 ENCOUNTER — Ambulatory Visit (INDEPENDENT_AMBULATORY_CARE_PROVIDER_SITE_OTHER): Payer: Medicare Other | Admitting: Family Medicine

## 2017-01-12 ENCOUNTER — Encounter: Payer: Self-pay | Admitting: Family Medicine

## 2017-01-12 VITALS — BP 116/73 | HR 69 | Temp 98.3°F | Ht 65.25 in | Wt 114.0 lb

## 2017-01-12 DIAGNOSIS — K588 Other irritable bowel syndrome: Secondary | ICD-10-CM

## 2017-01-12 DIAGNOSIS — Z0000 Encounter for general adult medical examination without abnormal findings: Secondary | ICD-10-CM

## 2017-01-12 DIAGNOSIS — M4692 Unspecified inflammatory spondylopathy, cervical region: Secondary | ICD-10-CM

## 2017-01-12 DIAGNOSIS — E7800 Pure hypercholesterolemia, unspecified: Secondary | ICD-10-CM | POA: Diagnosis not present

## 2017-01-12 DIAGNOSIS — M47812 Spondylosis without myelopathy or radiculopathy, cervical region: Secondary | ICD-10-CM

## 2017-01-12 DIAGNOSIS — E559 Vitamin D deficiency, unspecified: Secondary | ICD-10-CM | POA: Diagnosis not present

## 2017-01-12 DIAGNOSIS — Z23 Encounter for immunization: Secondary | ICD-10-CM

## 2017-01-12 NOTE — Progress Notes (Signed)
Subjective:    Patient ID: Kayla Randolph, female    DOB: 1945/05/30, 71 y.o.   MRN: [id number]  HPI Pt here for follow up and management of chronic medical problems which includes hyperlipidemia. She is taking medication regularly.The patient today is complaining with some bloatedness and increased gas and she says she is not eating dairy. She is due to get her pelvic exam. She'll be given an FOBT to return. She is a lab work done and we will review this with her during the visit today. All of her cholesterol numbers were excellent. The CBC had a normal white blood cell count and a good hemoglobin at 13 point to the slightly decreased from the past. Platelet count was adequate. The blood sugar is good at 77 creatinine, the most important kidney function test was normal the sodium was slightly elevated and all other electrolytes were normal. The vitamin D level was good at 62.4. All liver function tests were normal. The patient complains of this bloated and increased gas situation and that stopping the milk and dairy products may have helped the son. She denies any diarrhea or blood in the stool. She denies any nausea vomiting or indigestion. She just has bloating and fullness and lots of gas or flatulence. She denies any chest pain or shortness of breath. She is taking a probiotic. She denies any trouble with passing her water.    Patient Active Problem List   Diagnosis Date Noted  . Depression 04/03/2013  . Hyperlipemia 12/14/2012  . Vitamin D deficiency 12/14/2012  . Allergic rhinitis 12/14/2012  . 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 Osteopenia    Outpatient Encounter Prescriptions as of 01/12/2017  Medication Sig  . aspirin 81 MG tablet Take 81 mg by mouth daily.    . Black Pepper-Turmeric (TURMERIC COMPLEX/BLACK PEPPER PO) Take by mouth.  . Cholecalciferol (VITAMIN D3) 1000 UNITS CAPS Take 2 capsules by mouth daily.   . cycloSPORINE  (RESTASIS) 0.05 % ophthalmic emulsion Place 1 drop into both eyes 2 (two) times daily.  Marland Kitchen escitalopram (LEXAPRO) 10 MG tablet TAKE (1) TABLET DAILY AS DIRECTED.  Marland Kitchen estradiol (ESTRACE) 0.1 MG/GM vaginal cream INSERT 1 VAGINALLY AT BEDTIME  . fluticasone (FLONASE) 50 MCG/ACT nasal spray 1 SPRAY IN EACH NOSTRIL EVERY 12 HOURS  . meloxicam (MOBIC) 15 MG tablet Take one half to one daily after eating as directed  . Multiple Vitamins-Minerals (WOMENS 50+ MULTI VITAMIN/MIN PO) Take by mouth.  . Omega-3 Fatty Acids (SUPER OMEGA-3 PO) Take by mouth.  . Polyethylene Glycol 3350 (MIRALAX PO) Take 1 packet by mouth daily as needed.    . rosuvastatin (CRESTOR) 5 MG tablet Take 1 tablet (5 mg total) by mouth daily. As directed  . Specialty Vitamins Products (ONE-A-DAY BONE STRENGTH) 500-28-100 MG-MG-UNIT TABS Take 1 tablet by mouth 3 (three) times daily.  Marland Kitchen UNABLE TO FIND Med Name: Prelief Bladder  . doxycycline (VIBRA-TABS) 100 MG tablet Take 1 tablet (100 mg total) by mouth 2 (two) times daily. 1 po bid (Patient not taking: Reported on 01/12/2017)   No facility-administered encounter medications on file as of 01/12/2017.       Review of Systems  Constitutional: Negative.   HENT: Negative.   Eyes: Negative.   Respiratory: Negative.   Cardiovascular: Negative.   Gastrointestinal: Negative.        Bloated, increased gas  Endocrine: Negative.   Genitourinary: Negative.   Musculoskeletal: Negative.   Skin:  Negative.   Allergic/Immunologic: Negative.   Neurological: Negative.   Hematological: Negative.   Psychiatric/Behavioral: Negative.        Objective:   Physical Exam  Constitutional: She is oriented to person, place, and time. She appears well-developed and well-nourished. No distress.  Pleasant and alert  HENT:  Head: Normocephalic and atraumatic.  Right Ear: External ear normal.  Left Ear: External ear normal.  Nose: Nose normal.  Mouth/Throat: Oropharynx is clear and moist.  Eyes:  Pupils are equal, round, and reactive to light. Conjunctivae and EOM are normal. Right eye exhibits no discharge. Left eye exhibits no discharge. No scleral icterus.  Neck: Normal range of motion. Neck supple. No thyromegaly present.  No bruits thyromegaly or anterior cervical adenopathy  Cardiovascular: Normal rate, regular rhythm, normal heart sounds and intact distal pulses.   No murmur heard. The heart had a regular rate and rhythm at 72/m  Pulmonary/Chest: Effort normal and breath sounds normal. No respiratory distress. She has no wheezes. She has no rales.  Abdominal: Soft. Bowel sounds are normal. She exhibits no distension and no mass. There is no tenderness. There is no rebound and no guarding.  No liver or spleen enlargement and no masses palpable. Slightly increased bowel sounds.  Musculoskeletal: Normal range of motion. She exhibits no edema.  Lymphadenopathy:    She has no cervical adenopathy.  Neurological: She is alert and oriented to person, place, and time. She has normal reflexes. No cranial nerve deficit.  Skin: Skin is warm and dry. No rash noted.  Psychiatric: She has a normal mood and affect. Her behavior is normal. Judgment and thought content normal.  Nursing note and vitals reviewed.  BP 116/73 (BP Location: Left Arm)   Pulse 69   Temp 98.3 F (36.8 C) (Oral)   Ht 5' 5.25" (1.657 m)   Wt 114 lb (51.7 kg)   BMI 18.83 kg/m         Assessment & Plan:  1. Pure hypercholesterolemia -Cluster on numbers were excellent and patient will continue with current treatment  2. Vitamin D deficiency -Continue with vitamin D replacement  3. Health care maintenance -Consider getting the new shingles vaccine  4. Need for shingles vaccine -She should consider this.  5. Cervical spine arthritis ([place]) -Doing better with her cervical spine arthritis and currently not having any problems other than when she gets real tired.  6. Other irritable bowel syndrome -Continue  to watch and avoid as much as possible milk cheese ice cream and dairy products, caffeine, and try if you have to drink milk products to drink lactose-free. Also look it being more organic and gluten-free. -Patient will call us in a couple weeks if she is not better we will get the gastroenterologist to take a look at her CT scan some other suggestions for helping her.  Patient Instructions                       Medicare Annual Wellness Visit  [facility] and the medical providers at [facility] strive to bring you the best medical care.  In doing so we not only want to address your current medical conditions and concerns but also to detect new conditions early and prevent illness, disease and health-related problems.    Medicare offers a yearly Wellness Visit which allows our clinical staff to assess your need for preventative services including immunizations, lifestyle education, counseling to decrease risk of preventable diseases and screening for  fall risk and other medical concerns.    This visit is provided free of charge (no copay) for all Medicare recipients. The clinical pharmacists at [facility] have begun to conduct these Wellness Visits which will also include a thorough review of all your medications.    As you primary medical provider recommend that you make an appointment for your Annual Wellness Visit if you have not done so already this year.  You may set up this appointment before you leave today or you may call back ([phone]) and schedule an appointment.  Please make sure when you call that you mention that you are scheduling your Annual Wellness Visit with the clinical pharmacist so that the appointment may be made for the proper length of time.     Continue current medications. Continue good therapeutic lifestyle changes which include good diet and exercise. Fall precautions discussed with patient. If an FOBT was given today-  please return it to our front desk. If you are over 49 years old - you may need Prevnar 50 or the adult Pneumonia vaccine.  **Flu shots are available--- please call and schedule a FLU-CLINIC appointment**  After your visit with [place] today you will receive a survey in the mail or online from [employer] regarding your care with [place]. Please take a moment to fill this out. Your feedback is very important to [place] as you can help [place] better understand your patient needs as well as improve your experience and satisfaction. WE CARE ABOUT YOU!!!   Continue to drink plenty of water Avoid caffeine and milk cheese ice cream and dairy products If you have any dairy products do lactose free Also you might try to reduce the glutens in your diet and this could make a difference with the symptoms you are having with your stomach.  Continue with the probiotic or you may want to switch to one called Florastor Call us in a couple of weeks with your progress  Arrie Senate MD

## 2017-01-12 NOTE — Patient Instructions (Addendum)
Medicare Annual Wellness Visit  [facility] and the medical providers at [facility] strive to bring you the best medical care.  In doing so we not only want to address your current medical conditions and concerns but also to detect new conditions early and prevent illness, disease and health-related problems.    Medicare offers a yearly Wellness Visit which allows our clinical staff to assess your need for preventative services including immunizations, lifestyle education, counseling to decrease risk of preventable diseases and screening for fall risk and other medical concerns.    This visit is provided free of charge (no copay) for all Medicare recipients. The clinical pharmacists at [facility] have begun to conduct these Wellness Visits which will also include a thorough review of all your medications.    As you primary medical provider recommend that you make an appointment for your Annual Wellness Visit if you have not done so already this year.  You may set up this appointment before you leave today or you may call back ([phone]) and schedule an appointment.  Please make sure when you call that you mention that you are scheduling your Annual Wellness Visit with the clinical pharmacist so that the appointment may be made for the proper length of time.     Continue current medications. Continue good therapeutic lifestyle changes which include good diet and exercise. Fall precautions discussed with patient. If an FOBT was given today- please return it to our front desk. If you are over 55 years old - you may need Prevnar 37 or the adult Pneumonia vaccine.  **Flu shots are available--- please call and schedule a FLU-CLINIC appointment**  After your visit with [place] today you will receive a survey in the mail or online from [employer] regarding your care with [place]. Please take a moment to fill this out. Your feedback is very  important to [place] as you can help [place] better understand your patient needs as well as improve your experience and satisfaction. WE CARE ABOUT YOU!!!   Continue to drink plenty of water Avoid caffeine and milk cheese ice cream and dairy products If you have any dairy products do lactose free Also you might try to reduce the glutens in your diet and this could make a difference with the symptoms you are having with your stomach.  Continue with the probiotic or you may want to switch to one called Florastor Call us in a couple of weeks with your progress   Gluten-Free Diet for Celiac Disease, Adult The gluten-free diet includes all foods that do not contain gluten. Gluten is a protein that is found in wheat, rye, barley, and some other grains. Following the gluten-free diet is the only treatment for people with celiac disease. It helps to prevent damage to the intestines and improves or eliminates the symptoms of celiac disease. Following the gluten-free diet requires some planning. It can be challenging at first, but it gets easier with time and practice. There are more gluten-free options available today than ever before. If you need help finding gluten-free foods or if you have questions, talk with your diet and nutrition specialist (registered dietitian) or your health care provider. What do I need to know about a gluten-free diet?  All fruits, vegetables, and meats are safe to eat and do not contain gluten.  When grocery shopping, start by shopping in the produce, meat, and dairy sections. These sections are more likely to contain gluten-free foods. Then move to  the aisles that contain packaged foods if you need to.  Read all food labels. Gluten is often added to foods. Always check the ingredient list and look for warnings, such as "may contain gluten."  Talk with your dietitian or health care provider before taking a gluten-free multivitamin or mineral supplement.  Be aware of gluten-free  foods having contact with foods that contain gluten (cross-contamination). This can happen at home and with any processed foods. ? Talk with your health care provider or dietitian about how to reduce the risk of cross-contamination in your home. ? If you have questions about how a food is processed, ask the manufacturer. What key words help to identify gluten? Foods that list any of these key words on the label usually contain gluten:  Wheat, flour, enriched flour, bromated flour, white flour, durum flour, graham flour, phosphated flour, self-rising flour, semolina, farina, barley (malt), rye, and oats.  Starch, dextrin, modified food starch, or cereal.  Thickening, fillers, or emulsifiers.  Malt flavoring, malt extract, or malt syrup.  Hydrolyzed vegetable protein.  In the [place] packaged foods that are gluten-free are required to be labeled "GF." These foods should be easy to identify and are safe to eat. In the [place] food companies are also required to list common food allergens, including wheat, on their labels. Recommended foods Grains  Amaranth, bean flours, 100% buckwheat flour, corn, millet, nut flours or nut meals, GF oats, quinoa, rice, sorghum, teff, rice wafers, pure cornmeal tortillas, popcorn, and hot cereals made from cornmeal. Hominy, rice, wild rice. Some Asian rice noodles or bean noodles. Arrowroot starch, corn bran, corn flour, corn germ, cornmeal, corn starch, potato flour, potato starch flour, and rice bran. Plain, brown, and sweet rice flours. Rice polish, soy flour, and tapioca starch. Vegetables  All plain fresh, frozen, and canned vegetables. Fruits  All plain fresh, frozen, canned, and dried fruits, and 100% fruit juices. Meats and other protein foods  All fresh beef, pork, poultry, fish, seafood, and eggs. Fish canned in water, oil, brine, or vegetable broth. Plain nuts and seeds, peanut butter. Some lunch meat and some frankfurters. Dried beans, dried peas,  and lentils. Dairy  Fresh plain, dry, evaporated, or condensed milk. Cream, butter, sour cream, whipping cream, and most yogurts. Unprocessed cheese, most processed cheeses, some cottage cheese, some cream cheeses. Beverages  Coffee, tea, most herbal teas. Carbonated beverages and some root beers. Wine, sake, and distilled spirits, such as gin, vodka, and whiskey. Most hard ciders. Fats and oils  Butter, margarine, vegetable oil, hydrogenated butter, olive oil, shortening, lard, cream, and some mayonnaise. Some commercial salad dressings. Olives. Sweets and desserts  Sugar, honey, some syrups, molasses, jelly, and jam. Plain hard candy, marshmallows, and gumdrops. Pure cocoa powder. Plain chocolate. Custard and some pudding mixes. Gelatin desserts, sorbets, frozen ice pops, and sherbet. Cake, cookies, and other desserts prepared with allowed flours. Some commercial ice creams. Cornstarch, tapioca, and rice puddings. Seasoning and other foods  Some canned or frozen soups. Monosodium glutamate (MSG). Cider, rice, and wine vinegar. Baking soda and baking powder. Cream of tartar. Baking and nutritional yeast. Certain soy sauces made without wheat (ask your dietitian about specific brands that are allowed). Nuts, coconut, and chocolate. Salt, pepper, herbs, spices, flavoring extracts, imitation or artificial flavorings, natural flavorings, and food colorings. Some medicines and supplements. Some lip glosses and other cosmetics. Rice syrups. The items listed may not be a complete list. Talk with your dietitian about what dietary choices are best for you.  Foods to avoid Grains  Barley, bran, bulgur, couscous, cracked wheat, [place], farro, graham, malt, matzo, semolina, wheat germ, and all wheat and rye cereals including spelt and kamut. Cereals containing malt as a flavoring, such as rice cereal. Noodles, spaghetti, macaroni, most packaged rice mixes, and all mixes containing wheat, rye, barley, or  triticale. Vegetables  Most creamed vegetables and most vegetables canned in sauces. Some commercially prepared vegetables and salads. Fruits  Thickened or prepared fruits and some pie fillings. Some fruit snacks and fruit roll-ups. Meats and other protein foods  Any meat or meat alternative containing wheat, rye, barley, or gluten stabilizers. These are often marinated or packaged meats and lunch meats. Bread-containing products, such as Swiss steak, croquettes, meatballs, and meatloaf. Most tuna canned in vegetable broth and [place] with hydrolyzed vegetable protein (HVP) injected as part of the basting. [place]. Imitation fish. Eggs in sauces made from ingredients to avoid. Dairy  Commercial chocolate milk drinks and malted milk. Some non-dairy creamers. Any cheese product containing ingredients to avoid. Beverages  Certain cereal beverages. Beer, ale, malted milk, and some root beers. Some hard ciders. Some instant flavored coffees. Some herbal teas made with barley or with barley malt added. Fats and oils  Some commercial salad dressings. Sour cream containing modified food starch. Sweets and desserts  Some toffees. Chocolate-coated nuts (may be rolled in wheat flour) and some commercial candies and candy bars. Most cakes, cookies, donuts, pastries, and other baked goods. Some commercial ice cream. Ice cream cones. Commercially prepared mixes for cakes, cookies, and other desserts. Bread pudding and other puddings thickened with flour. Products containing brown rice syrup made with barley malt enzyme. Desserts and sweets made with malt flavoring. Seasoning and other foods  Some curry powders, some dry seasoning mixes, some gravy extracts, some meat sauces, some ketchups, some prepared mustards, and horseradish. Certain soy sauces. Malt vinegar. Bouillon and bouillon cubes that contain HVP. Some chip dips, and some chewing gum. Yeast extract. Brewer's yeast. Caramel color. Some medicines  and supplements. Some lip glosses and other cosmetics. The items listed may not be a complete list. Talk with your dietitian about what dietary choices are best for you. Summary  Gluten is a protein that is found in wheat, rye, barley, and some other grains. The gluten-free diet includes all foods that do not contain gluten.  If you need help finding gluten-free foods or if you have questions, talk with your diet and nutrition specialist (registered dietitian) or your health care provider.  Read all food labels. Gluten is often added to foods. Always check the ingredient list and look for warnings, such as "may contain gluten." This information is not intended to replace advice given to you by your health care provider. Make sure you discuss any questions you have with your health care provider. Document Released: 05/03/2005 Document Revised: 02/16/2016 Document Reviewed: 02/16/2016 Elsevier Interactive Patient Education  2018 [employer].

## 2017-01-14 LAB — THYROID PANEL WITH TSH
Free Thyroxine Index: 1.4 (ref 1.2–4.9)
T3 Uptake Ratio: 26 % (ref 24–39)
T4, Total: 5.5 ug/dL (ref 4.5–12.0)
TSH: 4.62 u[IU]/mL — ABNORMAL HIGH (ref 0.450–4.500)

## 2017-01-14 LAB — SPECIMEN STATUS REPORT

## 2017-01-26 ENCOUNTER — Telehealth: Payer: Self-pay | Admitting: Family Medicine

## 2017-01-26 NOTE — Telephone Encounter (Signed)
Pt notified of results Verbalizes understanding Pt also wanted to let Dr Laurance Flatten know that stomach problems are much improved and Dr Amedeo Plenty did her last colonoscopy

## 2017-01-28 ENCOUNTER — Ambulatory Visit
Admission: RE | Admit: 2017-01-28 | Discharge: 2017-01-28 | Disposition: A | Discharge status: Home / Self Care | Payer: Medicare Other | Source: Ambulatory Visit | Attending: Family Medicine | Admitting: Family Medicine

## 2017-01-28 DIAGNOSIS — Z1231 Encounter for screening mammogram for malignant neoplasm of breast: Secondary | ICD-10-CM | POA: Diagnosis not present

## 2017-01-29 ENCOUNTER — Other Ambulatory Visit: Payer: Medicare Other

## 2017-01-31 ENCOUNTER — Other Ambulatory Visit: Payer: Medicare Other

## 2017-01-31 DIAGNOSIS — Z1211 Encounter for screening for malignant neoplasm of colon: Secondary | ICD-10-CM | POA: Diagnosis not present

## 2017-02-03 LAB — FECAL OCCULT BLOOD, IMMUNOCHEMICAL: Fecal Occult Bld: NEGATIVE

## 2017-02-17 ENCOUNTER — Other Ambulatory Visit (INDEPENDENT_AMBULATORY_CARE_PROVIDER_SITE_OTHER): Payer: Medicare Other

## 2017-02-17 DIAGNOSIS — E039 Hypothyroidism, unspecified: Secondary | ICD-10-CM

## 2017-02-17 DIAGNOSIS — Z23 Encounter for immunization: Secondary | ICD-10-CM

## 2017-02-18 LAB — THYROID PANEL WITH TSH
Free Thyroxine Index: 1.4 (ref 1.2–4.9)
T3 Uptake Ratio: 26 % (ref 24–39)
T4, Total: 5.5 ug/dL (ref 4.5–12.0)
TSH: 2.66 u[IU]/mL (ref 0.450–4.500)

## 2017-03-10 ENCOUNTER — Ambulatory Visit (INDEPENDENT_AMBULATORY_CARE_PROVIDER_SITE_OTHER): Payer: Medicare Other | Admitting: Nurse Practitioner

## 2017-03-10 ENCOUNTER — Encounter: Payer: Self-pay | Admitting: Nurse Practitioner

## 2017-03-10 VITALS — BP 138/63 | HR 72 | Temp 97.7°F | Ht 65.0 in | Wt 112.0 lb

## 2017-03-10 DIAGNOSIS — Z1239 Encounter for other screening for malignant neoplasm of breast: Secondary | ICD-10-CM

## 2017-03-10 DIAGNOSIS — Z01419 Encounter for gynecological examination (general) (routine) without abnormal findings: Secondary | ICD-10-CM | POA: Diagnosis not present

## 2017-03-10 DIAGNOSIS — Z1231 Encounter for screening mammogram for malignant neoplasm of breast: Secondary | ICD-10-CM

## 2017-03-10 NOTE — Patient Instructions (Signed)
Breast Self-Awareness Breast self-awareness means:  Knowing how your breasts look.  Knowing how your breasts feel.  Checking your breasts every month for changes.  Telling your doctor if you notice a change in your breasts.  Breast self-awareness allows you to notice a breast problem early while it is still small. How to do a breast self-exam One way to learn what is normal for your breasts and to check for changes is to do a breast self-exam. To do a breast self-exam: Look for Changes  1. Take off all the clothes above your waist. 2. Stand in front of a mirror in a room with good lighting. 3. Put your hands on your hips. 4. Push your hands down. 5. Look at your breasts and nipples in the mirror to see if one breast or nipple looks different than the other. Check to see if: ? The shape of one breast is different. ? The size of one breast is different. ? There are wrinkles, dips, and bumps in one breast and not the other. 6. Look at each breast for changes in your skin, such as: ? Redness. ? Scaly areas. 7. Look for changes in your nipples, such as: ? Liquid around the nipples. ? Bleeding. ? Dimpling. ? Redness. ? A change in where the nipples are. Feel for Changes 1. Lie on your back on the floor. 2. Feel each breast. To do this, follow these steps: ? Pick a breast to feel. ? Put the arm closest to that breast above your head. ? Use your other arm to feel the nipple area of your breast. Feel the area with the pads of your three middle fingers by making small circles with your fingers. For the first circle, press lightly. For the second circle, press harder. For the third circle, press even harder. ? Keep making circles with your fingers at the light, harder, and even harder pressures as you move down your breast. Stop when you feel your ribs. ? Move your fingers a little toward the center of your body. ? Start making circles with your fingers again, this time going up until  you reach your collarbone. ? Keep making up and down circles until you reach your armpit. Remember to keep using the three pressures. ? Feel the other breast in the same way. 3. Sit or stand in the shower or tub. 4. With soapy water on your skin, feel each breast the same way you did in step 2, when you were lying on the floor. Write Down What You Find  After doing the self-exam, write down:  What is normal for each breast.  Any changes you find in each breast.  When you last had your period.  How often should I check my breasts? Check your breasts every month. If you are breastfeeding, the best time to check them is after you feed your baby or after you use a breast pump. If you get periods, the best time to check your breasts is 5-7 days after your period is over. When should I see my doctor? See your doctor if you notice:  A change in shape or size of your breasts or nipples.  A change in the skin of your breast or nipples, such as red or scaly skin.  Unusual fluid coming from your nipples.  A lump or thick area that was not there before.  Pain in your breasts.  Anything that concerns you.  This information is not intended to replace advice given to   you by your health care provider. Make sure you discuss any questions you have with your health care provider. Document Released: 10/20/2007 Document Revised: 10/09/2015 Document Reviewed: 03/23/2015 Elsevier Interactive Patient Education  2018 [employer]  

## 2017-03-10 NOTE — Progress Notes (Signed)
   Subjective:    Patient ID: Kayla Randolph, female    DOB: 1946-04-08, 71 y.o.   MRN: [id number]  HPI Kayla Randolph comes in today for pelvic exam. Kayla Randolph is a regular patient of Dr. Laurance Flatten who was seen 12/16/16 for routine folow up. Kayla Randolph is doing well today without complaints.      Review of Systems  Constitutional: Negative for activity change and appetite change.  HENT: Negative.   Eyes: Negative for pain.  Respiratory: Negative for shortness of breath.   Cardiovascular: Negative for chest pain, palpitations and leg swelling.  Gastrointestinal: Negative for abdominal pain.  Endocrine: Negative for polydipsia.  Genitourinary: Negative.   Skin: Negative for rash.  Neurological: Negative for dizziness, weakness and headaches.  Hematological: Does not bruise/bleed easily.  Psychiatric/Behavioral: Negative.   All other systems reviewed and are negative.      Objective:   Physical Exam  Constitutional: Kayla Randolph is oriented to person, place, and time. Kayla Randolph appears well-developed and well-nourished.  HENT:  Head: Normocephalic.  Right Ear: Hearing, tympanic membrane, external ear and ear canal normal.  Left Ear: Hearing, tympanic membrane, external ear and ear canal normal.  Nose: Nose normal.  Mouth/Throat: Uvula is midline and oropharynx is clear and moist.  Eyes: Pupils are equal, round, and reactive to light. Conjunctivae and EOM are normal.  Neck: Normal range of motion and full passive range of motion without pain. Neck supple. No JVD present. Carotid bruit is not present. No thyroid mass and no thyromegaly present.  Cardiovascular: Normal rate, normal heart sounds and intact distal pulses.   No murmur heard. Pulmonary/Chest: Effort normal and breath sounds normal. Right breast exhibits no inverted nipple, no mass, no nipple discharge, no skin change and no tenderness. Left breast exhibits no inverted nipple, no mass, no nipple discharge, no skin change and no tenderness.  Abdominal: Soft. Bowel  sounds are normal. Kayla Randolph exhibits no mass. There is no tenderness.  Genitourinary: Vagina normal and uterus normal. No breast swelling, tenderness, discharge or bleeding. No vaginal discharge found.  Genitourinary Comments: bimanual exam-No adnexal masses or tenderness. Cervical stenosis  Musculoskeletal: Normal range of motion.  Lymphadenopathy:    Kayla Randolph has no cervical adenopathy.  Neurological: Kayla Randolph is alert and oriented to person, place, and time.  Skin: Skin is warm and dry.  Psychiatric: Kayla Randolph has a normal mood and affect. Her behavior is normal. Judgment and thought content normal.    BP 138/63   Pulse 72   Temp 97.7 F (36.5 C) (Oral)   Ht 5\' 5"  (1.651 m)   Wt 112 lb (50.8 kg)   BMI 18.64 kg/m        Assessment & Plan:   1. Encounter for gynecological examination without abnormal finding   2. Screening breast examination    Keep follow up appintments with Dr. Laurance Flatten Continue self breast exams  Mary-Margaret Hassell Done, FNP

## 2017-03-24 ENCOUNTER — Ambulatory Visit (INDEPENDENT_AMBULATORY_CARE_PROVIDER_SITE_OTHER): Payer: Medicare Other | Admitting: *Deleted

## 2017-03-24 DIAGNOSIS — Z23 Encounter for immunization: Secondary | ICD-10-CM

## 2017-03-24 NOTE — Progress Notes (Signed)
Pt given Shingrix #2 IM left deltoid and tolerated well.

## 2017-03-25 ENCOUNTER — Other Ambulatory Visit: Payer: Self-pay | Admitting: Family Medicine

## 2017-03-31 ENCOUNTER — Other Ambulatory Visit: Payer: Self-pay | Admitting: Family Medicine

## 2017-05-07 ENCOUNTER — Other Ambulatory Visit: Payer: Self-pay | Admitting: Family Medicine

## 2017-05-13 ENCOUNTER — Other Ambulatory Visit: Payer: Self-pay | Admitting: Family Medicine

## 2017-05-30 ENCOUNTER — Other Ambulatory Visit: Payer: Medicare Other

## 2017-05-30 ENCOUNTER — Other Ambulatory Visit: Payer: Self-pay | Admitting: *Deleted

## 2017-05-30 DIAGNOSIS — E7800 Pure hypercholesterolemia, unspecified: Secondary | ICD-10-CM

## 2017-05-30 DIAGNOSIS — E559 Vitamin D deficiency, unspecified: Secondary | ICD-10-CM

## 2017-05-30 DIAGNOSIS — Z0000 Encounter for general adult medical examination without abnormal findings: Secondary | ICD-10-CM | POA: Diagnosis not present

## 2017-05-31 ENCOUNTER — Encounter: Payer: Self-pay | Admitting: Family Medicine

## 2017-05-31 ENCOUNTER — Ambulatory Visit (INDEPENDENT_AMBULATORY_CARE_PROVIDER_SITE_OTHER): Payer: Medicare Other | Admitting: Family Medicine

## 2017-05-31 VITALS — BP 109/60 | HR 68 | Temp 97.8°F | Ht 65.0 in | Wt 115.0 lb

## 2017-05-31 DIAGNOSIS — E034 Atrophy of thyroid (acquired): Secondary | ICD-10-CM | POA: Diagnosis not present

## 2017-05-31 DIAGNOSIS — Z0000 Encounter for general adult medical examination without abnormal findings: Secondary | ICD-10-CM

## 2017-05-31 DIAGNOSIS — E7800 Pure hypercholesterolemia, unspecified: Secondary | ICD-10-CM | POA: Diagnosis not present

## 2017-05-31 DIAGNOSIS — E559 Vitamin D deficiency, unspecified: Secondary | ICD-10-CM

## 2017-05-31 DIAGNOSIS — M47812 Spondylosis without myelopathy or radiculopathy, cervical region: Secondary | ICD-10-CM

## 2017-05-31 LAB — THYROID PANEL WITH TSH
Free Thyroxine Index: 1.3 (ref 1.2–4.9)
T3 Uptake Ratio: 24 % (ref 24–39)
T4, Total: 5.4 ug/dL (ref 4.5–12.0)
TSH: 5.21 u[IU]/mL — ABNORMAL HIGH (ref 0.450–4.500)

## 2017-05-31 LAB — CBC WITH DIFFERENTIAL/PLATELET
Basophils Absolute: 0 x10E3/uL (ref 0.0–0.2)
Basos: 0 %
EOS (ABSOLUTE): 0.2 x10E3/uL (ref 0.0–0.4)
Eos: 4 %
Hematocrit: 45.6 % (ref 34.0–46.6)
Hemoglobin: 14.7 g/dL (ref 11.1–15.9)
Immature Grans (Abs): 0 x10E3/uL (ref 0.0–0.1)
Immature Granulocytes: 0 %
Lymphocytes Absolute: 1.1 x10E3/uL (ref 0.7–3.1)
Lymphs: 19 %
MCH: 29.8 pg (ref 26.6–33.0)
MCHC: 32.2 g/dL (ref 31.5–35.7)
MCV: 93 fL (ref 79–97)
Monocytes Absolute: 0.7 x10E3/uL (ref 0.1–0.9)
Monocytes: 12 %
Neutrophils Absolute: 3.7 x10E3/uL (ref 1.4–7.0)
Neutrophils: 65 %
Platelets: 178 x10E3/uL (ref 150–379)
RBC: 4.93 x10E6/uL (ref 3.77–5.28)
RDW: 15.8 % — ABNORMAL HIGH (ref 12.3–15.4)
WBC: 5.6 x10E3/uL (ref 3.4–10.8)

## 2017-05-31 LAB — HEPATIC FUNCTION PANEL
ALT: 14 IU/L (ref 0–32)
AST: 21 IU/L (ref 0–40)
Albumin: 4.3 g/dL (ref 3.5–4.8)
Alkaline Phosphatase: 50 IU/L (ref 39–117)
Bilirubin Total: 0.4 mg/dL (ref 0.0–1.2)
Bilirubin, Direct: 0.12 mg/dL (ref 0.00–0.40)
Total Protein: 6.7 g/dL (ref 6.0–8.5)

## 2017-05-31 LAB — BMP8+EGFR
BUN/Creatinine Ratio: 16 (ref 12–28)
BUN: 12 mg/dL (ref 8–27)
CO2: 26 mmol/L (ref 20–29)
Calcium: 9.1 mg/dL (ref 8.7–10.3)
Chloride: 102 mmol/L (ref 96–106)
Creatinine, Ser: 0.75 mg/dL (ref 0.57–1.00)
GFR calc Af Amer: 92 mL/min/1.73
GFR calc non Af Amer: 80 mL/min/1.73
Glucose: 66 mg/dL (ref 65–99)
Potassium: 3.9 mmol/L (ref 3.5–5.2)
Sodium: 144 mmol/L (ref 134–144)

## 2017-05-31 LAB — VITAMIN D 25 HYDROXY (VIT D DEFICIENCY, FRACTURES): Vit D, 25-Hydroxy: 69.7 ng/mL (ref 30.0–100.0)

## 2017-05-31 LAB — LIPID PANEL
Chol/HDL Ratio: 2 ratio (ref 0.0–4.4)
Cholesterol, Total: 159 mg/dL (ref 100–199)
HDL: 79 mg/dL (ref >39)
LDL Calculated: 63 mg/dL (ref 0–99)
Triglycerides: 85 mg/dL (ref 0–149)
VLDL Cholesterol Cal: 17 mg/dL (ref 5–40)

## 2017-05-31 MED ORDER — LEVOTHYROXINE SODIUM 25 MCG PO TABS: 25.0000 ug | ORAL_TABLET | Freq: Every day | ORAL | 1 refills | Status: DC

## 2017-05-31 NOTE — Patient Instructions (Addendum)
Medicare Annual Wellness Visit  [place] and the medical providers at [facility] strive to bring you the best medical care.  In doing so we not only want to address your current medical conditions and concerns but also to detect new conditions early and prevent illness, disease and health-related problems.    Medicare offers a yearly Wellness Visit which allows our clinical staff to assess your need for preventative services including immunizations, lifestyle education, counseling to decrease risk of preventable diseases and screening for fall risk and other medical concerns.    This visit is provided free of charge (no copay) for all Medicare recipients. The clinical pharmacists at [facility] have begun to conduct these Wellness Visits which will also include a thorough review of all your medications.    As you primary medical provider recommend that you make an appointment for your Annual Wellness Visit if you have not done so already this year.  You may set up this appointment before you leave today or you may call back ([phone]) and schedule an appointment.  Please make sure when you call that you mention that you are scheduling your Annual Wellness Visit with the clinical pharmacist so that the appointment may be made for the proper length of time.     Continue current medications. Continue good therapeutic lifestyle changes which include good diet and exercise. Fall precautions discussed with patient. If an FOBT was given today- please return it to our front desk. If you are over 80 years old - you may need Prevnar 81 or the adult Pneumonia vaccine.  **Flu shots are available--- please call and schedule a FLU-CLINIC appointment**  After your visit with [place] today you will receive a survey in the mail or online from [employer] regarding your care with [place]. Please take a moment to fill this out. Your feedback is very  important to [place] as you can help [place] better understand your patient needs as well as improve your experience and satisfaction. WE CARE ABOUT YOU!!!  We will check with Dr. Amedeo Plenty office regarding the need for your next colonoscopy or sigmoidoscopy. Start the levothyroxine for the underactive thyroid and recheck a thyroid profile in 6-8 weeks

## 2017-05-31 NOTE — Progress Notes (Signed)
Subjective:    Patient ID: Kayla Randolph, female    DOB: Feb 16, 1946, 72 y.o.   MRN: [id number]  HPI Pt here for follow up and management of chronic medical problems which includes hyperlipidemia. She is taking medication regularly.  Patient has recently had lab work done and this is going to be reviewed with her during the visit today.  The blood sugar was good at 66 and the creatinine was within normal limits along with all of the electrolytes including potassium.  The TSH was elevated.  This is occurred on one other occasion about 4 months ago.  We will add levothyroxine 25 mcg to the current treatment medications and asked her to repeat her TSH and thyroid profile in about 6 weeks.  All liver function tests were normal.  The vitamin D level was good at 69.7 she will continue with current treatment.  All cholesterol numbers with traditional lipid testing were excellent and at goal with current treatment.  The CBC had a normal white blood cell count with a good hemoglobin at 14.7 and an adequate platelet count.  The patient is pleasant and doing well and has no specific complaints.  She denies any chest pain pressure tightness or shortness of breath.  She denies any trouble with swallowing heartburn indigestion or change in bowel habits.  She is passing her water well but does have more urgency at nighttime.  She continues to have her osteoarthritic complaints with stiffness that gets better with movement and exercise.  We did discuss the coated baby aspirin and off felt like she should continue to take this.  Some studies have indicated it does not help prevent heart attacks and she is aware that.  She does get her eyes checked regularly.  She is up-to-date on all of her healthcare parameters and her last colonoscopy was done in 2013.      Patient Active Problem List   Diagnosis Date Noted  . Depression 04/03/2013  . Hyperlipemia 12/14/2012  . Vitamin D deficiency 12/14/2012  . Allergic rhinitis  12/14/2012  . 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 Osteopenia    Outpatient Encounter Medications as of 05/31/2017  Medication Sig  . Black Pepper-Turmeric (TURMERIC COMPLEX/BLACK PEPPER PO) Take by mouth.  . Cholecalciferol (VITAMIN D3) 1000 UNITS CAPS Take 2 capsules by mouth daily.   . cycloSPORINE (RESTASIS) 0.05 % ophthalmic emulsion Place 1 drop into both eyes 2 (two) times daily.  Marland Kitchen escitalopram (LEXAPRO) 10 MG tablet TAKE (1) TABLET DAILY AS DIRECTED.  Marland Kitchen estradiol (ESTRACE) 0.1 MG/GM vaginal cream INSERT 1 VAGINALLY AT BEDTIME  . fluticasone (FLONASE) 50 MCG/ACT nasal spray 1 SPRAY IN EACH NOSTRIL EVERY 12 HOURS  . meloxicam (MOBIC) 15 MG tablet Take one half to one daily after eating as directed  . Multiple Vitamins-Minerals (WOMENS 50+ MULTI VITAMIN/MIN PO) Take by mouth.  . Omega-3 Fatty Acids (SUPER OMEGA-3 PO) Take by mouth.  . Polyethylene Glycol 3350 (MIRALAX PO) Take 1 packet by mouth daily as needed.    . rosuvastatin (CRESTOR) 5 MG tablet TAKE (1) TABLET DAILY AS DIRECTED.  Marland Kitchen Specialty Vitamins Products (ONE-A-DAY BONE STRENGTH) 500-28-100 MG-MG-UNIT TABS Take 1 tablet by mouth 3 (three) times daily.  Marland Kitchen UNABLE TO FIND Med Name: Prelief Bladder  . [DISCONTINUED] aspirin 81 MG tablet Take 81 mg by mouth daily.     No facility-administered encounter medications on file as of 05/31/2017.      Review of Systems  Constitutional: Negative.   HENT: Negative.   Eyes: Negative.   Respiratory: Negative.   Cardiovascular: Negative.   Gastrointestinal: Negative.   Endocrine: Negative.   Genitourinary: Negative.   Musculoskeletal: Negative.   Skin: Negative.   Allergic/Immunologic: Negative.   Neurological: Negative.   Hematological: Negative.   Psychiatric/Behavioral: Negative.        Objective:   Physical Exam  Constitutional: She is oriented to person, place, and time. She appears well-developed and well-nourished. No  distress.  Patient is pleasant and alert and has no specific complaints other than her osteoarthritis and early morning stiffness.  HENT:  Head: Normocephalic and atraumatic.  Right Ear: External ear normal.  Left Ear: External ear normal.  Nose: Nose normal.  Mouth/Throat: Oropharynx is clear and moist. No oropharyngeal exudate.  Eyes: Conjunctivae and EOM are normal. Pupils are equal, round, and reactive to light. Right eye exhibits no discharge. Left eye exhibits no discharge. No scleral icterus.  Neck: Normal range of motion. Neck supple. No thyromegaly present.  No bruits thyromegaly or anterior cervical adenopathy  Cardiovascular: Normal rate, regular rhythm, normal heart sounds and intact distal pulses. Exam reveals no friction rub.  No murmur heard. Heart is regular at 72/min with good pedal pulses and no edema  Pulmonary/Chest: Effort normal and breath sounds normal. No respiratory distress. She has no wheezes. She has no rales.  Clear anteriorly and posteriorly  Abdominal: Soft. Bowel sounds are normal. She exhibits no mass. There is no tenderness. There is no rebound and no guarding.  Slight suprapubic tenderness no liver or spleen enlargement no masses and no bruits and no inguinal adenopathy  Musculoskeletal: Normal range of motion. She exhibits no edema.  Lymphadenopathy:    She has no cervical adenopathy.  Neurological: She is alert and oriented to person, place, and time. She has normal reflexes. No cranial nerve deficit.  Skin: Skin is warm and dry. No rash noted.  Psychiatric: She has a normal mood and affect. Her behavior is normal. Judgment and thought content normal.  Nursing note and vitals reviewed.   BP 109/60 (BP Location: Left Arm)   Pulse 68   Temp 97.8 F (36.6 C) (Oral)   Ht 5\' 5"  (1.651 m)   Wt 115 lb (52.2 kg)   BMI 19.14 kg/m        Assessment & Plan:  1. Pure hypercholesterolemia -All cholesterol numbers are good she will continue with current  treatment   2. Vitamin D deficiency -Continue with vitamin D replacement  3. Health care maintenance -Continue with baby aspirin coated once daily and we will check with gastroenterologist regarding need for neck sigmoidoscopy or colonoscopy  4. Cervical spine arthritis -Take Tylenol as needed for pain  5. Hypothyroidism due to acquired atrophy of thyroid -Start levothyroxine 25 mcg 1 daily and recheck thyroid profile in 6-8 weeks  No orders of the defined types were placed in this encounter.  Patient Instructions                       Medicare Annual Wellness Visit  [facility] and the medical providers at [facility] strive to bring you the best medical care.  In doing so we not only want to address your current medical conditions and concerns but also to detect new conditions early and prevent illness, disease and health-related problems.    Medicare offers a yearly Wellness Visit which allows our clinical staff to assess your need for preventative  services including immunizations, lifestyle education, counseling to decrease risk of preventable diseases and screening for fall risk and other medical concerns.    This visit is provided free of charge (no copay) for all Medicare recipients. The clinical pharmacists at [facility] have begun to conduct these Wellness Visits which will also include a thorough review of all your medications.    As you primary medical provider recommend that you make an appointment for your Annual Wellness Visit if you have not done so already this year.  You may set up this appointment before you leave today or you may call back ([phone]) and schedule an appointment.  Please make sure when you call that you mention that you are scheduling your Annual Wellness Visit with the clinical pharmacist so that the appointment may be made for the proper length of time.     Continue current medications. Continue good  therapeutic lifestyle changes which include good diet and exercise. Fall precautions discussed with patient. If an FOBT was given today- please return it to our front desk. If you are over 55 years old - you may need Prevnar 101 or the adult Pneumonia vaccine.  **Flu shots are available--- please call and schedule a FLU-CLINIC appointment**  After your visit with [place] today you will receive a survey in the mail or online from [employer] regarding your care with [place]. Please take a moment to fill this out. Your feedback is very important to [place] as you can help [place] better understand your patient needs as well as improve your experience and satisfaction. WE CARE ABOUT YOU!!!  We will check with Dr. Amedeo Plenty office regarding the need for your next colonoscopy or sigmoidoscopy. Start the levothyroxine for the underactive thyroid and recheck a thyroid profile in 6-8 weeks   Arrie Senate MD

## 2017-06-03 ENCOUNTER — Telehealth: Payer: Self-pay | Admitting: Family Medicine

## 2017-06-03 NOTE — Telephone Encounter (Signed)
Pt aware  - hayes

## 2017-06-04 ENCOUNTER — Other Ambulatory Visit: Payer: Self-pay | Admitting: Family Medicine

## 2017-06-15 ENCOUNTER — Other Ambulatory Visit: Payer: Self-pay | Admitting: Family Medicine

## 2017-06-20 ENCOUNTER — Telehealth: Payer: Self-pay | Admitting: Family Medicine

## 2017-06-20 NOTE — Telephone Encounter (Signed)
Please call patient

## 2017-06-21 ENCOUNTER — Telehealth: Payer: Self-pay | Admitting: Family Medicine

## 2017-06-23 NOTE — Telephone Encounter (Signed)
Pt aware - to call Dr Amedeo Plenty office

## 2017-07-07 ENCOUNTER — Other Ambulatory Visit: Payer: Self-pay | Admitting: Family Medicine

## 2017-07-07 DIAGNOSIS — H04123 Dry eye syndrome of bilateral lacrimal glands: Secondary | ICD-10-CM | POA: Diagnosis not present

## 2017-07-07 DIAGNOSIS — H2513 Age-related nuclear cataract, bilateral: Secondary | ICD-10-CM | POA: Diagnosis not present

## 2017-07-07 DIAGNOSIS — H5203 Hypermetropia, bilateral: Secondary | ICD-10-CM | POA: Diagnosis not present

## 2017-07-07 DIAGNOSIS — H524 Presbyopia: Secondary | ICD-10-CM | POA: Diagnosis not present

## 2017-07-07 DIAGNOSIS — H52203 Unspecified astigmatism, bilateral: Secondary | ICD-10-CM | POA: Diagnosis not present

## 2017-07-26 ENCOUNTER — Other Ambulatory Visit: Payer: Medicare Other

## 2017-07-26 DIAGNOSIS — E034 Atrophy of thyroid (acquired): Secondary | ICD-10-CM

## 2017-07-27 LAB — THYROID PANEL WITH TSH
Free Thyroxine Index: 1.2 (ref 1.2–4.9)
T3 Uptake Ratio: 24 % (ref 24–39)
T4, Total: 5.2 ug/dL (ref 4.5–12.0)
TSH: 2.31 u[IU]/mL (ref 0.450–4.500)

## 2017-07-28 ENCOUNTER — Other Ambulatory Visit: Payer: Self-pay | Admitting: Family Medicine

## 2017-08-02 ENCOUNTER — Other Ambulatory Visit: Payer: Self-pay | Admitting: Family Medicine

## 2017-08-16 ENCOUNTER — Other Ambulatory Visit: Payer: Self-pay | Admitting: Family Medicine

## 2017-09-12 ENCOUNTER — Telehealth: Payer: Self-pay | Admitting: Family Medicine

## 2017-09-12 NOTE — Telephone Encounter (Signed)
Please advise 

## 2017-09-12 NOTE — Telephone Encounter (Signed)
appt changed

## 2017-09-30 ENCOUNTER — Ambulatory Visit: Payer: Medicare Other | Admitting: Family Medicine

## 2017-10-04 ENCOUNTER — Encounter: Payer: Self-pay | Admitting: Family Medicine

## 2017-10-04 ENCOUNTER — Ambulatory Visit (INDEPENDENT_AMBULATORY_CARE_PROVIDER_SITE_OTHER): Payer: Medicare Other | Admitting: Family Medicine

## 2017-10-04 VITALS — BP 110/58 | HR 62 | Temp 98.0°F | Ht 65.0 in | Wt 115.0 lb

## 2017-10-04 DIAGNOSIS — E034 Atrophy of thyroid (acquired): Secondary | ICD-10-CM | POA: Diagnosis not present

## 2017-10-04 DIAGNOSIS — E559 Vitamin D deficiency, unspecified: Secondary | ICD-10-CM | POA: Diagnosis not present

## 2017-10-04 DIAGNOSIS — J301 Allergic rhinitis due to pollen: Secondary | ICD-10-CM

## 2017-10-04 DIAGNOSIS — E7800 Pure hypercholesterolemia, unspecified: Secondary | ICD-10-CM

## 2017-10-04 DIAGNOSIS — M150 Primary generalized (osteo)arthritis: Secondary | ICD-10-CM | POA: Insufficient documentation

## 2017-10-04 DIAGNOSIS — Z780 Asymptomatic menopausal state: Secondary | ICD-10-CM | POA: Diagnosis not present

## 2017-10-04 DIAGNOSIS — M47812 Spondylosis without myelopathy or radiculopathy, cervical region: Secondary | ICD-10-CM

## 2017-10-04 DIAGNOSIS — K588 Other irritable bowel syndrome: Secondary | ICD-10-CM

## 2017-10-04 DIAGNOSIS — M159 Polyosteoarthritis, unspecified: Secondary | ICD-10-CM

## 2017-10-04 DIAGNOSIS — M8949 Other hypertrophic osteoarthropathy, multiple sites: Secondary | ICD-10-CM | POA: Insufficient documentation

## 2017-10-04 MED ORDER — ESTRADIOL 0.1 MG/GM VA CREA: TOPICAL_CREAM | VAGINAL | 11 refills | Status: DC

## 2017-10-04 NOTE — Patient Instructions (Addendum)
Medicare Annual Wellness Visit  [facility] and the medical providers at [facility] strive to bring you the best medical care.  In doing so we not only want to address your current medical conditions and concerns but also to detect new conditions early and prevent illness, disease and health-related problems.    Medicare offers a yearly Wellness Visit which allows our clinical staff to assess your need for preventative services including immunizations, lifestyle education, counseling to decrease risk of preventable diseases and screening for fall risk and other medical concerns.    This visit is provided free of charge (no copay) for all Medicare recipients. The clinical pharmacists at [facility] have begun to conduct these Wellness Visits which will also include a thorough review of all your medications.    As you primary medical provider recommend that you make an appointment for your Annual Wellness Visit if you have not done so already this year.  You may set up this appointment before you leave today or you may call back ([phone]) and schedule an appointment.  Please make sure when you call that you mention that you are scheduling your Annual Wellness Visit with the clinical pharmacist so that the appointment may be made for the proper length of time.     Continue current medications. Continue good therapeutic lifestyle changes which include good diet and exercise. Fall precautions discussed with patient. If an FOBT was given today- please return it to our front desk. If you are over 72 years old - you may need Prevnar 46 or the adult Pneumonia vaccine.  **Flu shots are available--- please call and schedule a FLU-CLINIC appointment**  After your visit with [place] today you will receive a survey in the mail or online from [employer] regarding your care with [place]. Please take a moment to fill this out. Your feedback is very  important to [place] as you can help [place] better understand your patient needs as well as improve your experience and satisfaction. WE CARE ABOUT YOU!!!   Continue to be careful do not put yourself at risk for falling Avoid climbing Drink plenty of water and stay well-hydrated Take ibuprofen if needed for more severe arthritic pain and always take this after eating.  Too much NSAID can irritate the stomach and affect kidney and liver function.  If the pain you are having with arthritis is less and not as bad then Tylenol would be safer to take for this kind of pain. The next colonoscopy is due in 2023.

## 2017-10-04 NOTE — Progress Notes (Signed)
Subjective:    Patient ID: Kayla Randolph, female    DOB: 06/17/1945, 72 y.o.   MRN: [id number]  HPI Pt here for follow up and management of chronic medical problems which includes hyperlipidemia. She is taking medication regularly.  The patient is doing well overall other than ongoing complaints with her arthritis.  She does have arthritis of the cervical spine.  The patient's last colonoscopy was in July 2013.  Her BMI today is 19.14.  She has had her lab work done today.  She is up-to-date on her mammograms.  She is up-to-date on her pelvic exam.  She is taking vitamin D3 Lexapro Flonase levothyroxine and Crestor.  She has had x-rays of the C-spine and lumbar spine and all x-rays show degenerative changes at multiple levels.  She has also had x-rays of the knees and feet.  She has scoliosis.  All of the x-rays have wear and tear arthritic problems.  Her mother had degenerative arthritis also.  The patient had me look at her hands again and the DIP joints is swollen on most of the fingers of both hands.  Currently she takes ibuprofen and not meloxicam.  She tries not to take anything.  I told her that ibuprofen would probably be better than the stronger meloxicam and if she was not hurting a lot but just a little to take Tylenol.  She denies any chest pain pressure tightness or shortness of breath.  She does have irritable bowel syndrome and sometimes she has loose stools and constipation.  She occasionally notices some trouble swallowing like something wants to hang up in her throat but this is not progressing and is staying at about the same frequency as it has been in the past.  She is not due her next colonoscopy until 2023.  She is passing her water well.    Patient Active Problem List   Diagnosis Date Noted  . Hypothyroidism due to acquired atrophy of thyroid 05/31/2017  . Depression 04/03/2013  . Hyperlipemia 12/14/2012  . Vitamin D deficiency 12/14/2012  . Allergic rhinitis 12/14/2012  .  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 Osteopenia    Outpatient Encounter Medications as of 10/04/2017  Medication Sig  . Black Pepper-Turmeric (TURMERIC COMPLEX/BLACK PEPPER PO) Take by mouth.  . Cholecalciferol (VITAMIN D3) 1000 UNITS CAPS Take 2 capsules by mouth daily.   . cycloSPORINE (RESTASIS) 0.05 % ophthalmic emulsion Place 1 drop into both eyes 2 (two) times daily.  Marland Kitchen escitalopram (LEXAPRO) 10 MG tablet TAKE (1) TABLET DAILY AS DIRECTED.  Marland Kitchen estradiol (ESTRACE) 0.1 MG/GM vaginal cream INSERT 1 VAGINALLY AT BEDTIME  . fluticasone (FLONASE) 50 MCG/ACT nasal spray 1 SPRAY IN EACH NOSTRIL EVERY 12 HOURS  . levothyroxine (SYNTHROID, LEVOTHROID) 25 MCG tablet Take 1 tablet (25 mcg total) by mouth daily before breakfast.  . meloxicam (MOBIC) 15 MG tablet Take one half to one daily after eating as directed  . Multiple Vitamins-Minerals (WOMENS 50+ MULTI VITAMIN/MIN PO) Take by mouth.  . Omega-3 Fatty Acids (SUPER OMEGA-3 PO) Take by mouth.  . Polyethylene Glycol 3350 (MIRALAX PO) Take 1 packet by mouth daily as needed.    . rosuvastatin (CRESTOR) 5 MG tablet TAKE (1) TABLET DAILY AS DIRECTED.  Marland Kitchen Specialty Vitamins Products (ONE-A-DAY BONE STRENGTH) 500-28-100 MG-MG-UNIT TABS Take 1 tablet by mouth 3 (three) times daily.  Marland Kitchen UNABLE TO FIND Med Name: Prelief Bladder  . [DISCONTINUED] fluticasone (FLONASE) 50 MCG/ACT nasal spray 1  SPRAY IN EACH NOSTRIL EVERY 12 HOURS   No facility-administered encounter medications on file as of 10/04/2017.       Review of Systems  Constitutional: Negative.   HENT: Negative.   Eyes: Negative.   Respiratory: Negative.   Cardiovascular: Negative.   Gastrointestinal: Negative.   Endocrine: Negative.   Genitourinary: Negative.   Musculoskeletal: Positive for arthralgias.  Skin: Negative.   Allergic/Immunologic: Negative.   Neurological: Negative.   Hematological: Negative.   Psychiatric/Behavioral: Negative.         Objective:   Physical Exam  Constitutional: She is oriented to person, place, and time. She appears well-developed and well-nourished. No distress.  Patient is pleasant small framed and alert.  HENT:  Head: Normocephalic and atraumatic.  Right Ear: External ear normal.  Left Ear: External ear normal.  Mouth/Throat: Oropharynx is clear and moist. No oropharyngeal exudate.  Some nasal turbinate congestion bilaterally  Eyes: Pupils are equal, round, and reactive to light. Conjunctivae and EOM are normal. Right eye exhibits no discharge. Left eye exhibits no discharge. No scleral icterus.  She sees the ophthalmologist regularly  Neck: Normal range of motion. Neck supple. No thyromegaly present.  No bruits thyromegaly or anterior cervical adenopathy  Cardiovascular: Normal rate, regular rhythm, normal heart sounds and intact distal pulses.  No murmur heard. Heart is regular at 60/min  Pulmonary/Chest: Effort normal and breath sounds normal. She has no wheezes. She has no rales.  Clear anteriorly and posteriorly  Abdominal: Soft. Bowel sounds are normal. She exhibits no mass. There is no tenderness. There is no rebound and no guarding.  No liver or spleen enlargement.  No epigastric tenderness.  No inguinal adenopathy or suprapubic tenderness.  Musculoskeletal: She exhibits tenderness and deformity. She exhibits no edema.  Patient has DIP joint swelling fingers of both hands.  This coincides with all the wear and tear arthritic changes in cervical spine lumbar spine and left knee.  Lymphadenopathy:    She has no cervical adenopathy.  Neurological: She is alert and oriented to person, place, and time. She has normal reflexes. No cranial nerve deficit.  Skin: Skin is warm and dry. No rash noted.  Psychiatric: She has a normal mood and affect. Her behavior is normal. Judgment and thought content normal.  The patient's affect mood and behavior are normal.  Nursing note and vitals  reviewed.   BP (!) 110/58 (BP Location: Left Arm)   Pulse 62   Temp 98 F (36.7 C) (Oral)   Ht '5\' 5"'  (1.651 m)   Wt 115 lb (52.2 kg)   BMI 19.14 kg/m        Assessment & Plan:  1. Pure hypercholesterolemia -10 you with Crestor and aggressive therapeutic lifestyle changes - BMP8+EGFR - CBC with Differential/Platelet - Lipid panel - Hepatic function panel  2. Vitamin D deficiency -Continue with vitamin D replacement pending results of lab work - CBC with Differential/Platelet - VITAMIN D 25 Hydroxy (Vit-D Deficiency, Fractures)  3. Hypothyroidism due to acquired atrophy of thyroid -Continue with thyroid replacement pending results of lab work - CBC with Differential/Platelet - Thyroid Panel With TSH  4. Cervical spine arthritis -Take ibuprofen periodically as needed for more severe pain but take Tylenol for milder pain.  Always take ibuprofen after eating.  5. Other irritable bowel syndrome -Continue with diet of avoidance reducing milk cheese ice cream and dairy products and caffeine in the diet as much as possible  6. Postmenopausal -Get DEXA scan every 2 years  7.  Primary osteoarthritis involving multiple joints -Take NSAID as directed and as needed Tylenol as directed  8. Seasonal allergic rhinitis due to pollen -Continue with Flonase and nasal saline  Meds ordered this encounter  Medications  . estradiol (ESTRACE) 0.1 MG/GM vaginal cream    Sig: INSERT 1 VAGINALLY AT BEDTIME    Dispense:  42.5 g    Refill:  11   Patient Instructions                       Medicare Annual Wellness Visit  [facility] and the medical providers at [facility] strive to bring you the best medical care.  In doing so we not only want to address your current medical conditions and concerns but also to detect new conditions early and prevent illness, disease and health-related problems.    Medicare offers a yearly Wellness Visit which allows our clinical  staff to assess your need for preventative services including immunizations, lifestyle education, counseling to decrease risk of preventable diseases and screening for fall risk and other medical concerns.    This visit is provided free of charge (no copay) for all Medicare recipients. The clinical pharmacists at [facility] have begun to conduct these Wellness Visits which will also include a thorough review of all your medications.    As you primary medical provider recommend that you make an appointment for your Annual Wellness Visit if you have not done so already this year.  You may set up this appointment before you leave today or you may call back ([phone]) and schedule an appointment.  Please make sure when you call that you mention that you are scheduling your Annual Wellness Visit with the clinical pharmacist so that the appointment may be made for the proper length of time.     Continue current medications. Continue good therapeutic lifestyle changes which include good diet and exercise. Fall precautions discussed with patient. If an FOBT was given today- please return it to our front desk. If you are over 36 years old - you may need Prevnar 26 or the adult Pneumonia vaccine.  **Flu shots are available--- please call and schedule a FLU-CLINIC appointment**  After your visit with [place] today you will receive a survey in the mail or online from [employer] regarding your care with [place]. Please take a moment to fill this out. Your feedback is very important to [place] as you can help [place] better understand your patient needs as well as improve your experience and satisfaction. WE CARE ABOUT YOU!!!   Continue to be careful do not put yourself at risk for falling Avoid climbing Drink plenty of water and stay well-hydrated Take ibuprofen if needed for more severe arthritic pain and always take this after eating.  Too much NSAID can irritate the stomach and affect kidney and liver  function.  If the pain you are having with arthritis is less and not as bad then Tylenol would be safer to take for this kind of pain. The next colonoscopy is due in 2023.    Arrie Senate MD

## 2017-10-05 LAB — CBC WITH DIFFERENTIAL/PLATELET
Basophils Absolute: 0 10*3/uL (ref 0.0–0.2)
Basos: 0 %
EOS (ABSOLUTE): 0.2 10*3/uL (ref 0.0–0.4)
Eos: 4 %
Hematocrit: 43.6 % (ref 34.0–46.6)
Hemoglobin: 14.3 g/dL (ref 11.1–15.9)
Immature Grans (Abs): 0 10*3/uL (ref 0.0–0.1)
Immature Granulocytes: 0 %
Lymphocytes Absolute: 1.1 10*3/uL (ref 0.7–3.1)
Lymphs: 22 %
MCH: 29.1 pg (ref 26.6–33.0)
MCHC: 32.8 g/dL (ref 31.5–35.7)
MCV: 89 fL (ref 79–97)
Monocytes Absolute: 0.5 10*3/uL (ref 0.1–0.9)
Monocytes: 11 %
Neutrophils Absolute: 3 10*3/uL (ref 1.4–7.0)
Neutrophils: 63 %
Platelets: 155 10*3/uL (ref 150–450)
RBC: 4.91 x10E6/uL (ref 3.77–5.28)
RDW: 16.8 % — ABNORMAL HIGH (ref 12.3–15.4)
WBC: 4.8 10*3/uL (ref 3.4–10.8)

## 2017-10-05 LAB — LIPID PANEL
Chol/HDL Ratio: 2.1 ratio (ref 0.0–4.4)
Cholesterol, Total: 158 mg/dL (ref 100–199)
HDL: 77 mg/dL (ref >39)
LDL Calculated: 68 mg/dL (ref 0–99)
Triglycerides: 66 mg/dL (ref 0–149)
VLDL Cholesterol Cal: 13 mg/dL (ref 5–40)

## 2017-10-05 LAB — BMP8+EGFR
BUN/Creatinine Ratio: 23 (ref 12–28)
BUN: 16 mg/dL (ref 8–27)
CO2: 25 mmol/L (ref 20–29)
Calcium: 9.2 mg/dL (ref 8.7–10.3)
Chloride: 103 mmol/L (ref 96–106)
Creatinine, Ser: 0.69 mg/dL (ref 0.57–1.00)
GFR calc Af Amer: 100 mL/min/{1.73_m2} (ref >59)
GFR calc non Af Amer: 87 mL/min/{1.73_m2} (ref >59)
Glucose: 75 mg/dL (ref 65–99)
Potassium: 4.4 mmol/L (ref 3.5–5.2)
Sodium: 144 mmol/L (ref 134–144)

## 2017-10-05 LAB — HEPATIC FUNCTION PANEL
ALT: 16 IU/L (ref 0–32)
AST: 22 IU/L (ref 0–40)
Albumin: 4.2 g/dL (ref 3.5–4.8)
Alkaline Phosphatase: 45 IU/L (ref 39–117)
Bilirubin Total: 0.3 mg/dL (ref 0.0–1.2)
Bilirubin, Direct: 0.12 mg/dL (ref 0.00–0.40)
Total Protein: 6.5 g/dL (ref 6.0–8.5)

## 2017-10-05 LAB — THYROID PANEL WITH TSH
Free Thyroxine Index: 1.3 (ref 1.2–4.9)
T3 Uptake Ratio: 23 % — ABNORMAL LOW (ref 24–39)
T4, Total: 5.6 ug/dL (ref 4.5–12.0)
TSH: 2.46 u[IU]/mL (ref 0.450–4.500)

## 2017-10-05 LAB — VITAMIN D 25 HYDROXY (VIT D DEFICIENCY, FRACTURES): Vit D, 25-Hydroxy: 63.3 ng/mL (ref 30.0–100.0)

## 2017-10-18 ENCOUNTER — Ambulatory Visit: Payer: Medicare Other | Admitting: *Deleted

## 2017-11-08 ENCOUNTER — Other Ambulatory Visit: Payer: Self-pay | Admitting: Family Medicine

## 2017-11-15 ENCOUNTER — Encounter: Payer: Self-pay | Admitting: *Deleted

## 2017-11-15 ENCOUNTER — Ambulatory Visit (INDEPENDENT_AMBULATORY_CARE_PROVIDER_SITE_OTHER): Payer: Medicare Other | Admitting: *Deleted

## 2017-11-15 VITALS — BP 116/75 | HR 69 | Ht 65.0 in | Wt 115.0 lb

## 2017-11-15 DIAGNOSIS — Z0000 Encounter for general adult medical examination without abnormal findings: Secondary | ICD-10-CM

## 2017-11-15 NOTE — Patient Instructions (Signed)
  Ms. Pressey , Thank you for taking time to come for your Medicare Wellness Visit. I appreciate your ongoing commitment to your health goals. Please review the following plan we discussed and let me know if I can assist you in the future.   These are the goals we discussed: Goals    . Exercise 3x per week (30 min per time)       This is a list of the screening recommended for you and due dates:  Health Maintenance  Topic Date Due  . Tetanus Vaccine  05/31/2018*  .  Hepatitis C: One time screening is recommended by Center for Disease Control  (CDC) for  adults born from [birth year] through 1966.   10/05/2018*  . Flu Shot  12/15/2017  . Stool Blood Test  01/31/2018  . Mammogram  01/29/2019  . Pap Smear  02/15/2019  . Colon Cancer Screening  11/14/2021  . DEXA scan (bone density measurement)  Completed  . Pneumonia vaccines  Completed  *Topic was postponed. The date shown is not the original due date.

## 2017-11-15 NOTE — Progress Notes (Addendum)
Subjective:   Kayla Randolph is a 72 y.o. female who presents for a Medicare Annual Wellness Visit. [place] lives at home with her husband of 81 years. They recently celebrated their anniversary at the [employer] with their family. They have two adult children and several grandkids.    Review of Systems    Patient reports that her overall health is unchanged compared to last year.  Cardiac Risk Factors include: advanced age (>48men, >38 women);sedentary lifestyle  All other systems negative       Current Medications (verified) Outpatient Encounter Medications as of 11/15/2017  Medication Sig  . Black Pepper-Turmeric (TURMERIC COMPLEX/BLACK PEPPER PO) Take by mouth.  . Cholecalciferol (VITAMIN D3) 1000 UNITS CAPS Take 2 capsules by mouth daily.   . cycloSPORINE (RESTASIS) 0.05 % ophthalmic emulsion Place 1 drop into both eyes 2 (two) times daily.  Marland Kitchen escitalopram (LEXAPRO) 10 MG tablet TAKE (1) TABLET DAILY AS DIRECTED.  Marland Kitchen estradiol (ESTRACE) 0.1 MG/GM vaginal cream INSERT 1 VAGINALLY AT BEDTIME  . fluticasone (FLONASE) 50 MCG/ACT nasal spray 1 SPRAY IN EACH NOSTRIL EVERY 12 HOURS  . levothyroxine (SYNTHROID, LEVOTHROID) 25 MCG tablet Take 1 tablet (25 mcg total) by mouth daily before breakfast.  . meloxicam (MOBIC) 15 MG tablet Take one half to one daily after eating as directed  . Multiple Vitamins-Minerals (WOMENS 50+ MULTI VITAMIN/MIN PO) Take by mouth.  . Omega-3 Fatty Acids (SUPER OMEGA-3 PO) Take by mouth.  . Polyethylene Glycol 3350 (MIRALAX PO) Take 1 packet by mouth daily as needed.    . rosuvastatin (CRESTOR) 5 MG tablet TAKE (1) TABLET DAILY AS DIRECTED.  Marland Kitchen Specialty Vitamins Products (ONE-A-DAY BONE STRENGTH) 500-28-100 MG-MG-UNIT TABS Take 1 tablet by mouth 3 (three) times daily.  Marland Kitchen UNABLE TO FIND Med Name: Prelief Bladder   No facility-administered encounter medications on file as of 11/15/2017.     Allergies (verified) Macrodantin; Sulfa antibiotics; and Wellbutrin  [bupropion hcl]   History: Past Medical History:  Diagnosis Date  . Allergy   . History of colon polyps   . Hyperlipidemia   . Interstitial cystitis    History of  . Osteopenia   . Vitamin D deficiency    Past Surgical History:  Procedure Laterality Date  . CESAREAN SECTION    . TONSILLECTOMY     Family History  Problem Relation Age of Onset  . Heart attack Father   . Osteoporosis Maternal Aunt   . Cancer Mother   . Early death Mother    Social History   Socioeconomic History  . Marital status: Married    Spouse name: Not on file  . Number of children: 3  . Years of education: 16  . Highest education level: Bachelor's degree (e.g., BA, AB, BS)  Occupational History  . Occupation: Retired    Comment: Printmaker and tutoring  Social Needs  . Financial resource strain: Not hard at all  . Food insecurity:    Worry: Never true    Inability: Never true  . Transportation needs:    Medical: No    Non-medical: No  Tobacco Use  . Smoking status: Never Smoker  . Smokeless tobacco: Never Used  Substance and Sexual Activity  . Alcohol use: Yes    Alcohol/week: 4.2 oz    Types: 7 Standard drinks or equivalent per week    Comment: wine nightly  . Drug use: No  . Sexual activity: Yes  Lifestyle  . Physical activity:    Days per week:  3 days    Minutes per session: 30 min  . Stress: To some extent  Relationships  . Social connections:    Talks on phone: More than three times a week    Gets together: More than three times a week    Attends religious service: More than 4 times per year    Active member of club or organization: Yes    Attends meetings of clubs or organizations: More than 4 times per year    Relationship status: Married  Other Topics Concern  . Not on file  Social History Narrative  . Not on file    Tobacco Use No.  Clinical Intake:     Pain : 0-10 Pain Score: 2  Pain Type: Chronic pain Pain Location: (generalized joint pain) Pain  Descriptors / Indicators: Aching Pain Onset: More than a month ago Pain Frequency: Intermittent Effect of Pain on Daily Activities: none     Nutritional Status: BMI of 19-24  Normal Diabetes: No  How often do you need to have someone help you when you read instructions, pamphlets, or other written materials from your doctor or pharmacy?: 1 - Never What is the last grade level you completed in school?: bachelor's degree  Interpreter Needed?: No  Information entered by :: Chong Sicilian, RN   Activities of Daily Living In your present state of health, do you have any difficulty performing the following activities: 11/15/2017  Hearing? N  Vision? N  Difficulty concentrating or making decisions? Y  Comment has noticed some difficulty with keeping up with a lot of things at one time  Walking or climbing stairs? N  Dressing or bathing? N  Doing errands, shopping? N  Preparing Food and eating ? N  Using the Toilet? N  In the past six months, have you accidently leaked urine? N  Do you have problems with loss of bowel control? N  Managing your Medications? N  Managing your Finances? N  Housekeeping or managing your Housekeeping? N  Some recent data might be hidden     Diet 3 meals a day   Exercise Current Exercise Habits: The patient does not participate in regular exercise at present, Exercise limited by: None identified. She was going to the gym 2 days a week regularly but she hasn't been lately.    Depression Screen PHQ 2/9 Scores 11/15/2017 10/04/2017 03/10/2017 03/10/2017 01/12/2017 01/01/2017 10/07/2016  PHQ - 2 Score 0 0 0 0 0 0 0     Fall Risk Fall Risk  11/15/2017 10/04/2017 03/10/2017 03/10/2017 01/12/2017  Falls in the past year? Yes Yes Yes No No  Number falls in past yr: 1 1 1  - -  Injury with Fall? Yes Yes No - -  Risk Factor Category  High Fall Risk - - - -  Risk for fall due to : History of fall(s) - - - -  Follow up Falls prevention discussed - - - -     Safety Is the patient's home free of loose throw rugs in walkways, pet beds, electrical cords, etc?   no      Grab bars in the bathroom? no      Walkin shower? yes      Shower Seat? no      Handrails on the stairs?   yes      Adequate lighting?   yes  Patient Care Team: Chipper Herb, MD as PCP - General (Family Medicine) Rutherford Guys, MD as Consulting Physician (Ophthalmology) Clarene Essex,  MD as Consulting Physician (Gastroenterology)   No hospitalizations, ER visits, or surgeries this past year.  Objective:    Today's Vitals   11/15/17 1114  BP: 116/75  Pulse: 69  Weight: 115 lb (52.2 kg)  Height: 5\' 5"  (1.651 m)  PainSc: 2    Body mass index is 19.14 kg/m.  Advanced Directives 11/15/2017 10/07/2016  Does Patient Have a Medical Advance Directive? Yes Yes  Type of Advance Directive Living will;Healthcare Power of Attorney Living will;Healthcare Power of Attorney  Does patient want to make changes to medical advance directive? No - Patient declined Yes (Inpatient - patient defers changing a medical advance directive at this time)  Copy of [place] in Chart? No - copy requested No - copy requested    Hearing/Vision  normal or No deficits noted during visit.  Cognitive Function: MMSE - Mini Mental State Exam 11/15/2017 10/07/2016  Orientation to time 5 5  Orientation to Place 5 5  Registration 3 3  Attention/ Calculation 5 5  Recall 3 2  Language- name 2 objects 2 2  Language- repeat 1 1  Language- follow 3 step command 3 3  Language- read & follow direction 1 1  Write a sentence 1 1  Copy design 1 1  Total score 30 29       Normal Cognitive Function Screening: Yes    Immunizations and Health Maintenance Immunization History  Administered Date(s) Administered  . Influenza Whole 02/14/2010  . Influenza, High Dose Seasonal PF 03/18/2016, 02/17/2017  . Influenza,inj,Quad PF,6+ Mos 04/03/2013, 03/14/2014, 03/28/2015  . Pneumococcal  Conjugate-13 05/15/2013  . Pneumococcal Polysaccharide-23 08/15/2009  . Tdap 05/18/2007  . Zoster 06/09/2006  . Zoster Recombinat (Shingrix) 10/07/2016, 03/24/2017   There are no preventive care reminders to display for this patient. Health Maintenance  Topic Date Due  . TETANUS/TDAP  05/31/2018 (Originally 05/17/2017)  . Hepatitis C Screening  10/05/2018 (Originally 1945-11-01)  . INFLUENZA VACCINE  12/15/2017  . COLON CANCER SCREENING ANNUAL FOBT  01/31/2018  . MAMMOGRAM  01/29/2019  . PAP SMEAR  02/15/2019  . COLONOSCOPY  11/14/2021  . DEXA SCAN  Completed  . PNA vac Low Risk Adult  Completed        Assessment:   This is a routine wellness examination for [place].    Plan:    Goals    . Exercise 3x per week (30 min per time)        Health Maintenance Recommendations: no recommendations at this time   Additional Screening Recommendations: Lung: Low Dose CT Chest recommended if Age 51-80 years, 30 pack-year currently smoking OR have quit w/in 15years. Patient does not qualify. Hepatitis C Screening recommended: no  Today's Orders No orders of the defined types were placed in this encounter.   Keep f/u with Chipper Herb, MD and any other specialty appointments you may have Continue current medications Move carefully to avoid falls. Use assistive devices like a can or walker if needed. Aim for at least 150 minutes of moderate activity a week. This can be done with chair exercises if necessary. Read or work on puzzles daily Stay connected with friends and family  I have personally reviewed and noted the following in the patient's chart:   . Medical and social history . Use of alcohol, tobacco or illicit drugs  . Current medications and supplements . Functional ability and status . Nutritional status . Physical activity . Advanced directives . List of other physicians . Hospitalizations, surgeries,  and ER visits in previous 12 months . Vitals . Screenings  to include cognitive, depression, and falls . Referrals and appointments  In addition, I have reviewed and discussed with patient certain preventive protocols, quality metrics, and best practice recommendations. A written personalized care plan for preventive services as well as general preventive health recommendations were provided to patient.     Chong Sicilian, RN   11/15/2017   I have reviewed and agree with the above AWV documentation.   Arrie Senate MD

## 2017-11-19 ENCOUNTER — Other Ambulatory Visit: Payer: Self-pay | Admitting: Family Medicine

## 2018-01-10 ENCOUNTER — Other Ambulatory Visit: Payer: Self-pay | Admitting: Family Medicine

## 2018-01-10 DIAGNOSIS — Z1231 Encounter for screening mammogram for malignant neoplasm of breast: Secondary | ICD-10-CM

## 2018-02-08 ENCOUNTER — Ambulatory Visit: Payer: Medicare Other | Admitting: Family Medicine

## 2018-02-10 ENCOUNTER — Telehealth: Payer: Self-pay | Admitting: Family Medicine

## 2018-02-10 ENCOUNTER — Other Ambulatory Visit: Payer: Medicare Other

## 2018-02-10 DIAGNOSIS — E034 Atrophy of thyroid (acquired): Secondary | ICD-10-CM

## 2018-02-10 DIAGNOSIS — E7800 Pure hypercholesterolemia, unspecified: Secondary | ICD-10-CM | POA: Diagnosis not present

## 2018-02-10 DIAGNOSIS — E559 Vitamin D deficiency, unspecified: Secondary | ICD-10-CM | POA: Diagnosis not present

## 2018-02-10 NOTE — Telephone Encounter (Signed)
Has apt 10/2 - please place labs

## 2018-02-10 NOTE — Telephone Encounter (Signed)
Go ahead and put in a thyroid panel, that it looks like the only thing that she needs done, please have it route the results to Dr. Laurance Flatten

## 2018-02-10 NOTE — Telephone Encounter (Signed)
Pt already drawn and labs ordered

## 2018-02-11 LAB — CBC WITH DIFFERENTIAL/PLATELET
Basophils Absolute: 0 10*3/uL (ref 0.0–0.2)
Basos: 1 %
EOS (ABSOLUTE): 0.2 10*3/uL (ref 0.0–0.4)
Eos: 3 %
Hematocrit: 43 % (ref 34.0–46.6)
Hemoglobin: 13.8 g/dL (ref 11.1–15.9)
Immature Grans (Abs): 0 10*3/uL (ref 0.0–0.1)
Immature Granulocytes: 0 %
Lymphocytes Absolute: 1 10*3/uL (ref 0.7–3.1)
Lymphs: 17 %
MCH: 29 pg (ref 26.6–33.0)
MCHC: 32.1 g/dL (ref 31.5–35.7)
MCV: 90 fL (ref 79–97)
Monocytes Absolute: 0.8 10*3/uL (ref 0.1–0.9)
Monocytes: 14 %
Neutrophils Absolute: 3.7 10*3/uL (ref 1.4–7.0)
Neutrophils: 65 %
Platelets: 182 10*3/uL (ref 150–450)
RBC: 4.76 x10E6/uL (ref 3.77–5.28)
RDW: 13.5 % (ref 12.3–15.4)
WBC: 5.7 10*3/uL (ref 3.4–10.8)

## 2018-02-11 LAB — THYROID PANEL WITH TSH
Free Thyroxine Index: 1.6 (ref 1.2–4.9)
T3 Uptake Ratio: 26 % (ref 24–39)
T4, Total: 6 ug/dL (ref 4.5–12.0)
TSH: 2.19 u[IU]/mL (ref 0.450–4.500)

## 2018-02-11 LAB — BMP8+EGFR
BUN/Creatinine Ratio: 18 (ref 12–28)
BUN: 14 mg/dL (ref 8–27)
CO2: 28 mmol/L (ref 20–29)
Calcium: 9.3 mg/dL (ref 8.7–10.3)
Chloride: 102 mmol/L (ref 96–106)
Creatinine, Ser: 0.78 mg/dL (ref 0.57–1.00)
GFR calc Af Amer: 87 mL/min/{1.73_m2} (ref >59)
GFR calc non Af Amer: 76 mL/min/{1.73_m2} (ref >59)
Glucose: 71 mg/dL (ref 65–99)
Potassium: 4.3 mmol/L (ref 3.5–5.2)
Sodium: 144 mmol/L (ref 134–144)

## 2018-02-11 LAB — LIPID PANEL
Chol/HDL Ratio: 1.9 ratio (ref 0.0–4.4)
Cholesterol, Total: 147 mg/dL (ref 100–199)
HDL: 77 mg/dL (ref >39)
LDL Calculated: 59 mg/dL (ref 0–99)
Triglycerides: 57 mg/dL (ref 0–149)
VLDL Cholesterol Cal: 11 mg/dL (ref 5–40)

## 2018-02-11 LAB — HEPATIC FUNCTION PANEL
ALT: 15 IU/L (ref 0–32)
AST: 25 IU/L (ref 0–40)
Albumin: 4.2 g/dL (ref 3.5–4.8)
Alkaline Phosphatase: 50 IU/L (ref 39–117)
Bilirubin Total: 0.3 mg/dL (ref 0.0–1.2)
Bilirubin, Direct: 0.1 mg/dL (ref 0.00–0.40)
Total Protein: 6.4 g/dL (ref 6.0–8.5)

## 2018-02-11 LAB — VITAMIN D 25 HYDROXY (VIT D DEFICIENCY, FRACTURES): Vit D, 25-Hydroxy: 60.7 ng/mL (ref 30.0–100.0)

## 2018-02-15 ENCOUNTER — Ambulatory Visit (INDEPENDENT_AMBULATORY_CARE_PROVIDER_SITE_OTHER): Payer: Medicare Other

## 2018-02-15 ENCOUNTER — Encounter: Payer: Self-pay | Admitting: Family Medicine

## 2018-02-15 ENCOUNTER — Ambulatory Visit (INDEPENDENT_AMBULATORY_CARE_PROVIDER_SITE_OTHER): Payer: Medicare Other | Admitting: Family Medicine

## 2018-02-15 VITALS — BP 116/69 | HR 72 | Temp 98.2°F | Ht 65.0 in | Wt 115.0 lb

## 2018-02-15 DIAGNOSIS — E559 Vitamin D deficiency, unspecified: Secondary | ICD-10-CM | POA: Diagnosis not present

## 2018-02-15 DIAGNOSIS — M25542 Pain in joints of left hand: Secondary | ICD-10-CM | POA: Diagnosis not present

## 2018-02-15 DIAGNOSIS — M19042 Primary osteoarthritis, left hand: Secondary | ICD-10-CM | POA: Diagnosis not present

## 2018-02-15 DIAGNOSIS — E7800 Pure hypercholesterolemia, unspecified: Secondary | ICD-10-CM | POA: Diagnosis not present

## 2018-02-15 DIAGNOSIS — R300 Dysuria: Secondary | ICD-10-CM

## 2018-02-15 DIAGNOSIS — M25541 Pain in joints of right hand: Secondary | ICD-10-CM

## 2018-02-15 DIAGNOSIS — M159 Polyosteoarthritis, unspecified: Secondary | ICD-10-CM

## 2018-02-15 DIAGNOSIS — M150 Primary generalized (osteo)arthritis: Secondary | ICD-10-CM | POA: Diagnosis not present

## 2018-02-15 DIAGNOSIS — M19041 Primary osteoarthritis, right hand: Secondary | ICD-10-CM | POA: Diagnosis not present

## 2018-02-15 DIAGNOSIS — M8949 Other hypertrophic osteoarthropathy, multiple sites: Secondary | ICD-10-CM

## 2018-02-15 DIAGNOSIS — E034 Atrophy of thyroid (acquired): Secondary | ICD-10-CM | POA: Diagnosis not present

## 2018-02-15 LAB — URINALYSIS, COMPLETE
Bilirubin, UA: NEGATIVE
Glucose, UA: NEGATIVE
Ketones, UA: NEGATIVE
Leukocytes, UA: NEGATIVE
Nitrite, UA: NEGATIVE
Protein, UA: NEGATIVE
RBC, UA: NEGATIVE
Specific Gravity, UA: 1.015 (ref 1.005–1.030)
Urobilinogen, Ur: 0.2 mg/dL (ref 0.2–1.0)
pH, UA: 7 (ref 5.0–7.5)

## 2018-02-15 LAB — MICROSCOPIC EXAMINATION
Bacteria, UA: NONE SEEN
RBC, UA: NONE SEEN /hpf (ref 0–2)

## 2018-02-15 NOTE — Patient Instructions (Addendum)
Medicare Annual Wellness Visit  [facility] and the medical providers at [facility] strive to bring you the best medical care.  In doing so we not only want to address your current medical conditions and concerns but also to detect new conditions early and prevent illness, disease and health-related problems.    Medicare offers a yearly Wellness Visit which allows our clinical staff to assess your need for preventative services including immunizations, lifestyle education, counseling to decrease risk of preventable diseases and screening for fall risk and other medical concerns.    This visit is provided free of charge (no copay) for all Medicare recipients. The clinical pharmacists at [facility] have begun to conduct these Wellness Visits which will also include a thorough review of all your medications.    As you primary medical provider recommend that you make an appointment for your Annual Wellness Visit if you have not done so already this year.  You may set up this appointment before you leave today or you may call back ([phone]) and schedule an appointment.  Please make sure when you call that you mention that you are scheduling your Annual Wellness Visit with the clinical pharmacist so that the appointment may be made for the proper length of time.     Continue current medications. Continue good therapeutic lifestyle changes which include good diet and exercise. Fall precautions discussed with patient. If an FOBT was given today- please return it to our front desk. If you are over 53 years old - you may need Prevnar 22 or the adult Pneumonia vaccine.  **Flu shots are available--- please call and schedule a FLU-CLINIC appointment**  After your visit with [place] today you will receive a survey in the mail or online from [employer] regarding your care with [place]. Please take a moment to fill this out. Your feedback is very  important to [place] as you can help [place] better understand your patient needs as well as improve your experience and satisfaction. WE CARE ABOUT YOU!!!   Stay active physically Avoid falling Drink plenty of water Periodically if the joints are hurting more take an Aleve after eating and always watch the stomach, watch for rise in blood pressure and watch for edema in the feet. We will call with x-ray results of hands as soon as those results become available We will also call with urinalysis results once that becomes available

## 2018-02-15 NOTE — Progress Notes (Signed)
Subjective:    Patient ID: Kayla Randolph, female    DOB: 03-19-1946, 72 y.o.   MRN: [id number]  HPI Pt here for follow up and management of chronic medical problems which includes hyperlipidemia. She is taking medication regularly.  Patient comes in today for a recheck of the joints in her hands and a regular follow-up.  Her vital signs are stable.  Her last pelvic exam was done and October 2018.  She is planning to get a mammogram tomorrow.  She will get a chest x-ray today and will be given an FOBT to return.  She has had lab work done and we we will review this with her during the visit today.  All cholesterol numbers were excellent with an LDL C of 59 and HDL of 77 with normal triglycerides.  The blood sugar creatinine and all electrolytes including potassium are normal.  The CBC was within normal limits with a stable hemoglobin at 13.8 and normal white blood cell count and an adequate platelet count.  The vitamin D level was good at 60.7.  All liver function tests were normal.  All thyroid test was normal.  The patient has a lot of joint complaints.  She has scoliosis and anterior listhesis of L5 on S1 and multilevel degenerative disc disease.  She has a calcaneal spur on the left foot.  She has chronic thoracolumbar scoliosis.  He also has degenerative disc disease in her cervical spine.  Patient is pleasant and doing well and has no specific complaints other than the joints in her hands hurting more.  She says that her mother had a lot of arthritic symptoms and arthritic complaints.  The patient today denies any chest pain pressure or tightness or shortness of breath anymore than usual.  She is active.  She denies any trouble with swallowing heartburn indigestion nausea vomiting diarrhea blood in the stool or black tarry bowel movements.  Her next colonoscopy is not due until the summer 2023.  There is no family history that she is aware of of colon cancer or colon polyps.  She is passing her water  without problems.     Patient Active Problem List   Diagnosis Date Noted  . Primary osteoarthritis involving multiple joints 10/04/2017  . Hypothyroidism due to acquired atrophy of thyroid 05/31/2017  . Depression 04/03/2013  . Hyperlipemia 12/14/2012  . Vitamin D deficiency 12/14/2012  . Allergic rhinitis 12/14/2012  . 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 Osteopenia    Outpatient Encounter Medications as of 02/15/2018  Medication Sig  . Black Pepper-Turmeric (TURMERIC COMPLEX/BLACK PEPPER PO) Take by mouth.  . Cholecalciferol (VITAMIN D3) 1000 UNITS CAPS Take 2 capsules by mouth daily.   . cycloSPORINE (RESTASIS) 0.05 % ophthalmic emulsion Place 1 drop into both eyes 2 (two) times daily.  Marland Kitchen escitalopram (LEXAPRO) 10 MG tablet TAKE (1) TABLET DAILY AS DIRECTED.  Marland Kitchen estradiol (ESTRACE) 0.1 MG/GM vaginal cream INSERT 1 VAGINALLY AT BEDTIME  . fluticasone (FLONASE) 50 MCG/ACT nasal spray 1 SPRAY IN EACH NOSTRIL EVERY 12 HOURS  . levothyroxine (SYNTHROID, LEVOTHROID) 25 MCG tablet Take 1 tablet (25 mcg total) by mouth daily before breakfast.  . meloxicam (MOBIC) 15 MG tablet Take one half to one daily after eating as directed  . Multiple Vitamins-Minerals (WOMENS 50+ MULTI VITAMIN/MIN PO) Take by mouth.  . Omega-3 Fatty Acids (SUPER OMEGA-3 PO) Take by mouth.  . Polyethylene Glycol 3350 (MIRALAX PO) Take 1 packet by mouth  daily as needed.    . rosuvastatin (CRESTOR) 5 MG tablet TAKE (1) TABLET DAILY AS DIRECTED.  Marland Kitchen Specialty Vitamins Products (ONE-A-DAY BONE STRENGTH) 500-28-100 MG-MG-UNIT TABS Take 1 tablet by mouth 3 (three) times daily.  Marland Kitchen UNABLE TO FIND Med Name: Prelief Bladder   No facility-administered encounter medications on file as of 02/15/2018.       Review of Systems  Constitutional: Negative.   HENT: Negative.   Eyes: Negative.   Respiratory: Negative.   Cardiovascular: Negative.   Gastrointestinal: Negative.   Endocrine:  Negative.   Genitourinary: Negative.   Musculoskeletal: Positive for arthralgias (joints/ hands more sore and larger ).  Skin: Negative.   Allergic/Immunologic: Negative.   Neurological: Negative.   Hematological: Negative.   Psychiatric/Behavioral: Negative.        Objective:   Physical Exam  Constitutional: She is oriented to person, place, and time. She appears well-developed and well-nourished. No distress.  The patient is pleasant and alert and indicates she is having more trouble using her hands because of joint pain.  HENT:  Head: Normocephalic and atraumatic.  Right Ear: External ear normal.  Left Ear: External ear normal.  Nose: Nose normal.  Mouth/Throat: Oropharynx is clear and moist. No oropharyngeal exudate.  Eyes: Pupils are equal, round, and reactive to light. Conjunctivae and EOM are normal. Right eye exhibits no discharge. Left eye exhibits no discharge. No scleral icterus.  Neck: Normal range of motion. Neck supple. No thyromegaly present.  Cardiovascular: Normal rate, regular rhythm, normal heart sounds and intact distal pulses.  No murmur heard. Patient has a heart rate that is regular at 72/min with good pedal pulse  Pulmonary/Chest: Effort normal and breath sounds normal. She has no wheezes. She has no rales.  Clear anteriorly and posteriorly  Abdominal: Soft. Bowel sounds are normal. She exhibits no mass. There is no tenderness. There is no rebound and no guarding.  No abdominal tenderness masses organ enlargement or bruits.  Musculoskeletal: Normal range of motion. She exhibits tenderness and deformity. She exhibits no edema.  Patient has thoracolumbar scoliosis DIP joint swelling along with cervical and lumbar degenerative changes.  Lymphadenopathy:    She has no cervical adenopathy.  Neurological: She is alert and oriented to person, place, and time. She has normal reflexes. No cranial nerve deficit.  Reflexes are 2+ and equal bilaterally  Skin: Skin is  warm and dry. No rash noted.  Psychiatric: She has a normal mood and affect. Her behavior is normal. Judgment and thought content normal.  The patient's mood affect and behavior are all normal.  Nursing note and vitals reviewed.  BP 116/69 (BP Location: Left Arm)   Pulse 72   Temp 98.2 F (36.8 C) (Oral)   Ht 5\' 5"  (1.651 m)   Wt 115 lb (52.2 kg)   BMI 19.14 kg/m         Assessment & Plan:  1. Pure hypercholesterolemia -All cholesterol numbers were excellent.  The patient will continue with her low-dose Crestor and with her omega-3 fatty acids along with therapeutic lifestyle changes - DG Chest 2 View; Future  2. Hypothyroidism due to acquired atrophy of thyroid -The thyroid profile was normal and she will continue with her current treatment regimen  3. Vitamin D deficiency -Continue with vitamin D replacement  4. Primary osteoarthritis involving multiple joints -Take an Aleve periodically after eating if needed for increased joint pain symptoms, always watching and making sure that it does not irritate the stomach raise the blood  pressure or cause swelling in the feet.  5. Joint pain in fingers of both hands -Periodic Aleve once or twice daily after eating - DG HANDS 1 VIEW BILAT BALLCATCHERS; Future  6. Dysuria -Check urinalysis - Urinalysis, Complete - Urine Culture  Patient Instructions                       Medicare Annual Wellness Visit  [facility] and the medical providers at [facility] strive to bring you the best medical care.  In doing so we not only want to address your current medical conditions and concerns but also to detect new conditions early and prevent illness, disease and health-related problems.    Medicare offers a yearly Wellness Visit which allows our clinical staff to assess your need for preventative services including immunizations, lifestyle education, counseling to decrease risk of preventable diseases and screening  for fall risk and other medical concerns.    This visit is provided free of charge (no copay) for all Medicare recipients. The clinical pharmacists at [facility] have begun to conduct these Wellness Visits which will also include a thorough review of all your medications.    As you primary medical provider recommend that you make an appointment for your Annual Wellness Visit if you have not done so already this year.  You may set up this appointment before you leave today or you may call back ([phone]) and schedule an appointment.  Please make sure when you call that you mention that you are scheduling your Annual Wellness Visit with the clinical pharmacist so that the appointment may be made for the proper length of time.     Continue current medications. Continue good therapeutic lifestyle changes which include good diet and exercise. Fall precautions discussed with patient. If an FOBT was given today- please return it to our front desk. If you are over 49 years old - you may need Prevnar 23 or the adult Pneumonia vaccine.  **Flu shots are available--- please call and schedule a FLU-CLINIC appointment**  After your visit with [place] today you will receive a survey in the mail or online from [employer] regarding your care with [place]. Please take a moment to fill this out. Your feedback is very important to [place] as you can help [place] better understand your patient needs as well as improve your experience and satisfaction. WE CARE ABOUT YOU!!!   Stay active physically Avoid falling Drink plenty of water Periodically if the joints are hurting more take an Aleve after eating and always watch the stomach, watch for rise in blood pressure and watch for edema in the feet. We will call with x-ray results of hands as soon as those results become available We will also call with urinalysis results once that becomes available  Arrie Senate MD

## 2018-02-16 ENCOUNTER — Other Ambulatory Visit: Payer: Self-pay | Admitting: *Deleted

## 2018-02-16 ENCOUNTER — Ambulatory Visit
Admission: RE | Admit: 2018-02-16 | Discharge: 2018-02-16 | Disposition: A | Discharge status: Home / Self Care | Payer: Medicare Other | Source: Ambulatory Visit | Attending: Family Medicine | Admitting: Family Medicine

## 2018-02-16 DIAGNOSIS — M19041 Primary osteoarthritis, right hand: Secondary | ICD-10-CM

## 2018-02-16 DIAGNOSIS — Z1231 Encounter for screening mammogram for malignant neoplasm of breast: Secondary | ICD-10-CM

## 2018-02-16 DIAGNOSIS — M19042 Primary osteoarthritis, left hand: Principal | ICD-10-CM

## 2018-02-16 LAB — URINE CULTURE

## 2018-02-21 ENCOUNTER — Other Ambulatory Visit: Payer: Self-pay | Admitting: Family Medicine

## 2018-03-01 ENCOUNTER — Other Ambulatory Visit: Payer: Medicare Other

## 2018-03-01 DIAGNOSIS — Z1211 Encounter for screening for malignant neoplasm of colon: Secondary | ICD-10-CM

## 2018-03-05 LAB — FECAL OCCULT BLOOD, IMMUNOCHEMICAL: Fecal Occult Bld: NEGATIVE

## 2018-03-07 ENCOUNTER — Ambulatory Visit (INDEPENDENT_AMBULATORY_CARE_PROVIDER_SITE_OTHER): Payer: Medicare Other

## 2018-03-07 DIAGNOSIS — Z23 Encounter for immunization: Secondary | ICD-10-CM | POA: Diagnosis not present

## 2018-03-10 ENCOUNTER — Other Ambulatory Visit: Payer: Self-pay | Admitting: Family Medicine

## 2018-03-16 DIAGNOSIS — M13841 Other specified arthritis, right hand: Secondary | ICD-10-CM | POA: Diagnosis not present

## 2018-03-16 DIAGNOSIS — M19049 Primary osteoarthritis, unspecified hand: Secondary | ICD-10-CM | POA: Insufficient documentation

## 2018-03-16 DIAGNOSIS — M13842 Other specified arthritis, left hand: Secondary | ICD-10-CM | POA: Diagnosis not present

## 2018-06-09 ENCOUNTER — Ambulatory Visit (INDEPENDENT_AMBULATORY_CARE_PROVIDER_SITE_OTHER): Payer: Medicare Other | Admitting: *Deleted

## 2018-06-09 DIAGNOSIS — S61215A Laceration without foreign body of left ring finger without damage to nail, initial encounter: Secondary | ICD-10-CM

## 2018-06-09 DIAGNOSIS — S61219A Laceration without foreign body of unspecified finger without damage to nail, initial encounter: Secondary | ICD-10-CM

## 2018-06-09 DIAGNOSIS — Z23 Encounter for immunization: Secondary | ICD-10-CM

## 2018-06-09 NOTE — Progress Notes (Signed)
Pt given Tetanus vaccine Tolerated well 

## 2018-06-19 ENCOUNTER — Other Ambulatory Visit: Payer: Medicare Other

## 2018-06-19 DIAGNOSIS — E559 Vitamin D deficiency, unspecified: Secondary | ICD-10-CM | POA: Diagnosis not present

## 2018-06-19 DIAGNOSIS — E7800 Pure hypercholesterolemia, unspecified: Secondary | ICD-10-CM

## 2018-06-19 DIAGNOSIS — E034 Atrophy of thyroid (acquired): Secondary | ICD-10-CM | POA: Diagnosis not present

## 2018-06-20 LAB — CBC WITH DIFFERENTIAL/PLATELET
Basophils Absolute: 0 10*3/uL (ref 0.0–0.2)
Basos: 1 %
EOS (ABSOLUTE): 0.3 10*3/uL (ref 0.0–0.4)
Eos: 6 %
Hematocrit: 44.3 % (ref 34.0–46.6)
Hemoglobin: 14.2 g/dL (ref 11.1–15.9)
Immature Grans (Abs): 0 10*3/uL (ref 0.0–0.1)
Immature Granulocytes: 0 %
Lymphocytes Absolute: 1.2 10*3/uL (ref 0.7–3.1)
Lymphs: 25 %
MCH: 30 pg (ref 26.6–33.0)
MCHC: 32.1 g/dL (ref 31.5–35.7)
MCV: 94 fL (ref 79–97)
Monocytes Absolute: 0.6 10*3/uL (ref 0.1–0.9)
Monocytes: 12 %
Neutrophils Absolute: 2.6 10*3/uL (ref 1.4–7.0)
Neutrophils: 56 %
Platelets: 177 10*3/uL (ref 150–450)
RBC: 4.73 x10E6/uL (ref 3.77–5.28)
RDW: 14 % (ref 11.7–15.4)
WBC: 4.6 10*3/uL (ref 3.4–10.8)

## 2018-06-20 LAB — HEPATIC FUNCTION PANEL
ALT: 20 IU/L (ref 0–32)
AST: 27 IU/L (ref 0–40)
Albumin: 4.1 g/dL (ref 3.7–4.7)
Alkaline Phosphatase: 50 IU/L (ref 39–117)
Bilirubin Total: 0.3 mg/dL (ref 0.0–1.2)
Bilirubin, Direct: 0.12 mg/dL (ref 0.00–0.40)
Total Protein: 6.1 g/dL (ref 6.0–8.5)

## 2018-06-20 LAB — THYROID PANEL WITH TSH
Free Thyroxine Index: 1.4 (ref 1.2–4.9)
T3 Uptake Ratio: 26 % (ref 24–39)
T4, Total: 5.4 ug/dL (ref 4.5–12.0)
TSH: 2.71 u[IU]/mL (ref 0.450–4.500)

## 2018-06-20 LAB — LIPID PANEL
Chol/HDL Ratio: 2 ratio (ref 0.0–4.4)
Cholesterol, Total: 147 mg/dL (ref 100–199)
HDL: 74 mg/dL (ref >39)
LDL Calculated: 61 mg/dL (ref 0–99)
Triglycerides: 61 mg/dL (ref 0–149)
VLDL Cholesterol Cal: 12 mg/dL (ref 5–40)

## 2018-06-20 LAB — BMP8+EGFR
BUN/Creatinine Ratio: 16 (ref 12–28)
BUN: 11 mg/dL (ref 8–27)
CO2: 26 mmol/L (ref 20–29)
Calcium: 8.6 mg/dL — ABNORMAL LOW (ref 8.7–10.3)
Chloride: 104 mmol/L (ref 96–106)
Creatinine, Ser: 0.7 mg/dL (ref 0.57–1.00)
GFR calc Af Amer: 99 mL/min/{1.73_m2} (ref >59)
GFR calc non Af Amer: 86 mL/min/{1.73_m2} (ref >59)
Glucose: 79 mg/dL (ref 65–99)
Potassium: 4.1 mmol/L (ref 3.5–5.2)
Sodium: 144 mmol/L (ref 134–144)

## 2018-06-20 LAB — VITAMIN D 25 HYDROXY (VIT D DEFICIENCY, FRACTURES): Vit D, 25-Hydroxy: 48.5 ng/mL (ref 30.0–100.0)

## 2018-06-21 ENCOUNTER — Ambulatory Visit (INDEPENDENT_AMBULATORY_CARE_PROVIDER_SITE_OTHER): Payer: Medicare Other

## 2018-06-21 ENCOUNTER — Ambulatory Visit (INDEPENDENT_AMBULATORY_CARE_PROVIDER_SITE_OTHER): Payer: Medicare Other | Admitting: Family Medicine

## 2018-06-21 ENCOUNTER — Encounter: Payer: Self-pay | Admitting: Family Medicine

## 2018-06-21 VITALS — BP 112/63 | HR 71 | Temp 97.6°F | Ht 65.0 in | Wt 114.0 lb

## 2018-06-21 DIAGNOSIS — Z13820 Encounter for screening for osteoporosis: Secondary | ICD-10-CM

## 2018-06-21 DIAGNOSIS — M47812 Spondylosis without myelopathy or radiculopathy, cervical region: Secondary | ICD-10-CM

## 2018-06-21 DIAGNOSIS — M25541 Pain in joints of right hand: Secondary | ICD-10-CM | POA: Diagnosis not present

## 2018-06-21 DIAGNOSIS — E559 Vitamin D deficiency, unspecified: Secondary | ICD-10-CM | POA: Diagnosis not present

## 2018-06-21 DIAGNOSIS — E7800 Pure hypercholesterolemia, unspecified: Secondary | ICD-10-CM | POA: Diagnosis not present

## 2018-06-21 DIAGNOSIS — Z780 Asymptomatic menopausal state: Secondary | ICD-10-CM

## 2018-06-21 DIAGNOSIS — M8589 Other specified disorders of bone density and structure, multiple sites: Secondary | ICD-10-CM | POA: Diagnosis not present

## 2018-06-21 DIAGNOSIS — E034 Atrophy of thyroid (acquired): Secondary | ICD-10-CM

## 2018-06-21 DIAGNOSIS — M25542 Pain in joints of left hand: Secondary | ICD-10-CM

## 2018-06-21 NOTE — Progress Notes (Signed)
Subjective:    Patient ID: Kayla Randolph, female    DOB: 13-Jan-1946, 73 y.o.   MRN: [id number]  HPI Pt here for follow up and management of chronic medical problems which includes hypothyroid and hyperlipidemia. She is taking medication regularly.  The patient is doing well overall and has no complaints today.  She has had blood work done and this will be reviewed with her during the visit today.  All of her cholesterol numbers including the LDL triglycerides and HDL were excellent.  Blood sugar creatinine and electrolytes were all good serum calcium was slightly decreased by 1/10 of a point.  The CBC was within normal limits with a normal white blood cell count a good hemoglobin at 13.8 and an adequate platelet count.  All liver function tests were normal.  The vitamin D level was good at 48.5.  All thyroid function tests were normal.  The patient is due to get a DEXA scan and has had lab work as I said we will review this with the patient today.  Her vital signs are stable and her weight is stable and her body mass index is 19.14.  Patient is pleasant and doing well.  She is soon to be taken care of her husband who is going have a hip replacement.  She is up-to-date on her eye exams.  She denies any chest pain pressure tightness or shortness of breath anymore than usual.  She denies any trouble with her stomach including nausea vomiting diarrhea blood in the stool or black tarry bowel movements.  She did have an intestinal virus sometime before Christmas but is over that without any problems.  She is passing her water without problems.  She will get a DEXA scan today.    Patient Active Problem List   Diagnosis Date Noted  . Primary osteoarthritis involving multiple joints 10/04/2017  . Hypothyroidism due to acquired atrophy of thyroid 05/31/2017  . Depression 04/03/2013  . Hyperlipemia 12/14/2012  . Vitamin D deficiency 12/14/2012  . Allergic rhinitis 12/14/2012  . Cervical spine arthritis  12/14/2012  . 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 Osteopenia    Outpatient Encounter Medications as of 06/21/2018  Medication Sig  . Black Pepper-Turmeric (TURMERIC COMPLEX/BLACK PEPPER PO) Take by mouth.  . Cholecalciferol (VITAMIN D3) 1000 UNITS CAPS Take 2 capsules by mouth daily.   . cycloSPORINE (RESTASIS) 0.05 % ophthalmic emulsion Place 1 drop into both eyes 2 (two) times daily.  Marland Kitchen escitalopram (LEXAPRO) 10 MG tablet TAKE (1) TABLET DAILY AS DIRECTED.  Marland Kitchen estradiol (ESTRACE) 0.1 MG/GM vaginal cream INSERT 1 VAGINALLY AT BEDTIME  . fluticasone (FLONASE) 50 MCG/ACT nasal spray 1 SPRAY IN EACH NOSTRIL EVERY 12 HOURS  . levothyroxine (SYNTHROID, LEVOTHROID) 25 MCG tablet Take 1 tablet (25 mcg total) by mouth daily before breakfast.  . meloxicam (MOBIC) 15 MG tablet Take one half to one daily after eating as directed  . Multiple Vitamins-Minerals (WOMENS 50+ MULTI VITAMIN/MIN PO) Take by mouth.  . Omega-3 Fatty Acids (SUPER OMEGA-3 PO) Take by mouth.  . Polyethylene Glycol 3350 (MIRALAX PO) Take 1 packet by mouth daily as needed.    . rosuvastatin (CRESTOR) 5 MG tablet TAKE (1) TABLET DAILY AS DIRECTED.  Marland Kitchen Specialty Vitamins Products (ONE-A-DAY BONE STRENGTH) 500-28-100 MG-MG-UNIT TABS Take 1 tablet by mouth 3 (three) times daily.  Marland Kitchen UNABLE TO FIND Med Name: Prelief Bladder   No facility-administered encounter medications on file as of 06/21/2018.      Review of  Systems  Constitutional: Negative.   HENT: Negative.   Eyes: Negative.   Respiratory: Negative.   Cardiovascular: Negative.   Gastrointestinal: Negative.   Endocrine: Negative.   Genitourinary: Negative.   Musculoskeletal: Negative.   Skin: Negative.        Left ring finger - recent wound   Allergic/Immunologic: Negative.   Neurological: Negative.   Hematological: Negative.   Psychiatric/Behavioral: Negative.        Objective:   Physical Exam Vitals signs and nursing note reviewed.  Constitutional:       Appearance: She is well-developed and normal weight. She is not ill-appearing.     Comments: The patient is pleasant and alert.  HENT:     Head: Normocephalic and atraumatic.     Right Ear: Tympanic membrane, ear canal and external ear normal. There is no impacted cerumen.     Left Ear: Tympanic membrane, ear canal and external ear normal. There is no impacted cerumen.     Nose: Nose normal. No congestion or rhinorrhea.     Mouth/Throat:     Mouth: Mucous membranes are moist.     Pharynx: Oropharynx is clear. No oropharyngeal exudate or posterior oropharyngeal erythema.  Eyes:     General: No scleral icterus.       Right eye: No discharge.        Left eye: No discharge.     Extraocular Movements: Extraocular movements intact.     Conjunctiva/sclera: Conjunctivae normal.     Pupils: Pupils are equal, round, and reactive to light.  Neck:     Musculoskeletal: Normal range of motion and neck supple.     Thyroid: No thyromegaly.     Vascular: No carotid bruit or JVD.     Comments: No bruits thyromegaly or anterior cervical adenopathy Cardiovascular:     Rate and Rhythm: Normal rate and regular rhythm.     Heart sounds: Normal heart sounds. No murmur. No gallop.      Comments: The heart is regular at 72/min.  1 irregular beat noted during the entire exam.  There is no edema and she has good pedal pulses Pulmonary:     Effort: Pulmonary effort is normal.     Breath sounds: Normal breath sounds. No wheezing or rales.     Comments: Clear anteriorly and posteriorly Abdominal:     General: Bowel sounds are normal.     Palpations: Abdomen is soft. There is no mass.     Tenderness: There is no abdominal tenderness.  Musculoskeletal: Normal range of motion.        General: No tenderness.     Right lower leg: No edema.     Left lower leg: No edema.  Lymphadenopathy:     Cervical: No cervical adenopathy.  Skin:    General: Skin is warm and dry.     Findings: No rash.  Neurological:      General: No focal deficit present.     Mental Status: She is alert and oriented to person, place, and time. Mental status is at baseline.     Cranial Nerves: No cranial nerve deficit.     Motor: No weakness.     Gait: Gait normal.     Deep Tendon Reflexes: Reflexes are normal and symmetric. Reflexes normal.  Psychiatric:        Mood and Affect: Mood normal.        Behavior: Behavior normal.        Thought Content: Thought content normal.  Judgment: Judgment normal.     Comments: Mood affect and behavior for this patient are normal for her.    BP 112/63 (BP Location: Left Arm)   Pulse 71   Temp 97.6 F (36.4 C) (Oral)   Ht 5\' 5"  (1.651 m)   Wt 114 lb (51.7 kg)   BMI 18.97 kg/m         Assessment & Plan:  1. Pure hypercholesterolemia -Continue Crestor as all cholesterol numbers are excellent.  Continue with therapeutic lifestyle changes  2. Vitamin D deficiency -Continue with vitamin D replacement - DG [facility] DEXA; Future  3. Hypothyroidism due to acquired atrophy of thyroid -All thyroid tests were good and she will continue with current treatment  4. Screening for osteoporosis - DG [facility] DEXA; Future  5. Postmenopausal - DG [facility] DEXA; Future  6. Cervical spine arthritis -No complaints with this today and she will continue with taking Tylenol  7. Joint pain in fingers of both hands -Joint swelling without rubor is still present and she is doing well with this with no additional needs today.  Patient Instructions                       Medicare Annual Wellness Visit  [facility] and the medical providers at [facility] strive to bring you the best medical care.  In doing so we not only want to address your current medical conditions and concerns but also to detect new conditions early and prevent illness, disease and health-related problems.    Medicare offers a yearly Wellness Visit which allows our clinical staff to assess your need for  preventative services including immunizations, lifestyle education, counseling to decrease risk of preventable diseases and screening for fall risk and other medical concerns.    This visit is provided free of charge (no copay) for all Medicare recipients. The clinical pharmacists at [facility] have begun to conduct these Wellness Visits which will also include a thorough review of all your medications.    As you primary medical provider recommend that you make an appointment for your Annual Wellness Visit if you have not done so already this year.  You may set up this appointment before you leave today or you may call back ([phone]) and schedule an appointment.  Please make sure when you call that you mention that you are scheduling your Annual Wellness Visit with the clinical pharmacist so that the appointment may be made for the proper length of time.     Continue current medications. Continue good therapeutic lifestyle changes which include good diet and exercise. Fall precautions discussed with patient. If an FOBT was given today- please return it to our front desk. If you are over 26 years old - you may need Prevnar 28 or the adult Pneumonia vaccine.  **Flu shots are available--- please call and schedule a FLU-CLINIC appointment**  After your visit with [place] today you will receive a survey in the mail or online from [employer] regarding your care with [place]. Please take a moment to fill this out. Your feedback is very important to [place] as you can help [place] better understand your patient needs as well as improve your experience and satisfaction. WE CARE ABOUT YOU!!!  Continue to use nasal saline I would stay on the Flonase at least until the end of May and then you can taper off of this and restart it again in September Now is the time to  be on Flonase in preparation for the spring allergies. Stay active physically and continue to be careful not to put yourself at risk for  falling Walk regularly as this helps to build bone density   Arrie Senate MD

## 2018-06-21 NOTE — Patient Instructions (Addendum)
Medicare Annual Wellness Visit  [facility] and the medical providers at [facility] strive to bring you the best medical care.  In doing so we not only want to address your current medical conditions and concerns but also to detect new conditions early and prevent illness, disease and health-related problems.    Medicare offers a yearly Wellness Visit which allows our clinical staff to assess your need for preventative services including immunizations, lifestyle education, counseling to decrease risk of preventable diseases and screening for fall risk and other medical concerns.    This visit is provided free of charge (no copay) for all Medicare recipients. The clinical pharmacists at [facility] have begun to conduct these Wellness Visits which will also include a thorough review of all your medications.    As you primary medical provider recommend that you make an appointment for your Annual Wellness Visit if you have not done so already this year.  You may set up this appointment before you leave today or you may call back ([phone]) and schedule an appointment.  Please make sure when you call that you mention that you are scheduling your Annual Wellness Visit with the clinical pharmacist so that the appointment may be made for the proper length of time.     Continue current medications. Continue good therapeutic lifestyle changes which include good diet and exercise. Fall precautions discussed with patient. If an FOBT was given today- please return it to our front desk. If you are over 74 years old - you may need Prevnar 56 or the adult Pneumonia vaccine.  **Flu shots are available--- please call and schedule a FLU-CLINIC appointment**  After your visit with [place] today you will receive a survey in the mail or online from [employer] regarding your care with [place]. Please take a moment to fill this out. Your feedback is very  important to [place] as you can help [place] better understand your patient needs as well as improve your experience and satisfaction. WE CARE ABOUT YOU!!!  Continue to use nasal saline I would stay on the Flonase at least until the end of May and then you can taper off of this and restart it again in September Now is the time to be on Flonase in preparation for the spring allergies. Stay active physically and continue to be careful not to put yourself at risk for falling Walk regularly as this helps to build bone density

## 2018-07-05 ENCOUNTER — Other Ambulatory Visit: Payer: Self-pay | Admitting: Family Medicine

## 2018-07-08 ENCOUNTER — Other Ambulatory Visit: Payer: Self-pay | Admitting: Family Medicine

## 2018-07-21 ENCOUNTER — Other Ambulatory Visit: Payer: Self-pay | Admitting: Family Medicine

## 2018-08-25 ENCOUNTER — Other Ambulatory Visit: Payer: Self-pay | Admitting: Family Medicine

## 2018-10-25 ENCOUNTER — Encounter: Payer: Self-pay | Admitting: Family Medicine

## 2018-10-25 ENCOUNTER — Ambulatory Visit (INDEPENDENT_AMBULATORY_CARE_PROVIDER_SITE_OTHER): Payer: Medicare Other | Admitting: Family Medicine

## 2018-10-25 ENCOUNTER — Other Ambulatory Visit: Payer: Self-pay

## 2018-10-25 DIAGNOSIS — E782 Mixed hyperlipidemia: Secondary | ICD-10-CM

## 2018-10-25 DIAGNOSIS — J301 Allergic rhinitis due to pollen: Secondary | ICD-10-CM | POA: Diagnosis not present

## 2018-10-25 DIAGNOSIS — M19041 Primary osteoarthritis, right hand: Secondary | ICD-10-CM

## 2018-10-25 DIAGNOSIS — E559 Vitamin D deficiency, unspecified: Secondary | ICD-10-CM

## 2018-10-25 DIAGNOSIS — M19042 Primary osteoarthritis, left hand: Secondary | ICD-10-CM | POA: Diagnosis not present

## 2018-10-25 DIAGNOSIS — M47812 Spondylosis without myelopathy or radiculopathy, cervical region: Secondary | ICD-10-CM | POA: Diagnosis not present

## 2018-10-25 DIAGNOSIS — M150 Primary generalized (osteo)arthritis: Secondary | ICD-10-CM

## 2018-10-25 DIAGNOSIS — E034 Atrophy of thyroid (acquired): Secondary | ICD-10-CM | POA: Diagnosis not present

## 2018-10-25 DIAGNOSIS — M8949 Other hypertrophic osteoarthropathy, multiple sites: Secondary | ICD-10-CM

## 2018-10-25 DIAGNOSIS — M159 Polyosteoarthritis, unspecified: Secondary | ICD-10-CM

## 2018-10-25 NOTE — Patient Instructions (Addendum)
Continue to stay active physically Drink plenty of water and fluids Avoid sweets and carbohydrates Take Tylenol for aches pains and fever Practice good hand and respiratory hygiene Get mammograms regularly Check stools for blood and color Come to the office for blood work and make sure Cologuard is given to patient to return Sit AC if needed for reflux or indigestion secondary to spicy food intake

## 2018-10-25 NOTE — Addendum Note (Signed)
Addended by: Zannie Cove on: 10/25/2018 03:13 PM   Modules accepted: Orders

## 2018-10-25 NOTE — Progress Notes (Signed)
Virtual Visit Via telephone Note I connected with@ on 10/25/18 by telephone and verified that I am speaking with the correct person or authorized healthcare agent using two identifiers. Kayla Randolph is currently located at home and there are no unauthorized people in close proximity. I completed this visit while in a private location in my home .  This visit type was conducted due to national recommendations for restrictions regarding the COVID-19 Pandemic (e.g. social distancing).  This format is felt to be most appropriate for this patient at this time.  All issues noted in this document were discussed and addressed.  No physical exam was performed.    I discussed the limitations, risks, security and privacy concerns of performing an evaluation and management service by telephone and the availability of in person appointments. I also discussed with the patient that there may be a patient responsible charge related to this service. The patient expressed understanding and agreed to proceed.   Date:  10/25/2018    ID:  ADIA CRAMMER      [birth year]        [id number]   Patient Care Team Patient Care Team: Chipper Herb, MD as PCP - General (Family Medicine) Rutherford Guys, MD as Consulting Physician (Ophthalmology) Clarene Essex, MD as Consulting Physician (Gastroenterology)  Reason for Visit: Primary Care Follow-up     History of Present Illness & Review of Systems:     Kayla Randolph is a 73 y.o. year old female primary care patient that presents today for a telehealth visit.  Patient is alert pleasant and doing well overall.  She denies any chest pain pressure tightness or shortness of breath.  Other than one recent episode at night when she had some heartburn and indigestion.  She is doing well with swallowing nausea vomiting diarrhea or blood in the stool.  It is been a long time since she has had a colonoscopy and her fecal occult blood test cards have been negative and we have not  pushed her to do that.  We would encourage her to at least get one Cologuard checked and she will consider doing this and this will be discussed with her by my nurse when she comes in for her blood work.  She has not seen any blood in the stool or had any changes in bowel habits.  She is passing her water well with no burning or frequency anymore than usual.  She does continue to complain with hand pain and arthritis pain that comes and goes and is always worse when it is damp and cold.  Review of systems as stated, otherwise negative.  The patient does not have symptoms concerning for COVID-19 infection (fever, chills, cough, or new shortness of breath).      Current Medications (Verified) Allergies as of 10/25/2018      Reactions   Macrodantin    Sulfa Antibiotics    Wellbutrin [bupropion Hcl]       Medication List       Accurate as of October 25, 2018  9:16 AM. If you have any questions, ask your nurse or doctor.        cycloSPORINE 0.05 % ophthalmic emulsion Commonly known as:  RESTASIS Place 1 drop into both eyes 2 (two) times daily.   escitalopram 10 MG tablet Commonly known as:  LEXAPRO TAKE (1) TABLET DAILY AS DIRECTED.   estradiol 0.1 MG/GM vaginal cream Commonly known as:  ESTRACE INSERT 1 VAGINALLY AT  BEDTIME   fluticasone 50 MCG/ACT nasal spray Commonly known as:  FLONASE 1 SPRAY IN EACH NOSTRIL EVERY 12 HOURS   levothyroxine 25 MCG tablet Commonly known as:  SYNTHROID Take 1 tablet (25 mcg total) by mouth daily before breakfast.   meloxicam 15 MG tablet Commonly known as:  MOBIC Take one half to one daily after eating as directed   MIRALAX PO Take 1 packet by mouth daily as needed.   One-A-Day Bone Strength 500-28-100 MG-MG-UNIT Tabs Take 1 tablet by mouth 3 (three) times daily.   rosuvastatin 5 MG tablet Commonly known as:  CRESTOR TAKE (1) TABLET DAILY AS DIRECTED.   SUPER OMEGA-3 PO Take by mouth.   TURMERIC COMPLEX/BLACK PEPPER PO Take by  mouth.   UNABLE TO FIND Med Name: Prelief Bladder   Vitamin D3 25 MCG (1000 UT) Caps Take 2 capsules by mouth daily.   WOMENS 50+ MULTI VITAMIN/MIN PO Take by mouth.           Allergies (Verified)    Macrodantin; Sulfa antibiotics; and Wellbutrin [bupropion hcl]  Past Medical History Past Medical History:  Diagnosis Date   Allergy    History of colon polyps    Hyperlipidemia    Interstitial cystitis    History of   Osteopenia    Vitamin D deficiency      Past Surgical History:  Procedure Laterality Date   CESAREAN SECTION     TONSILLECTOMY      Social History   Socioeconomic History   Marital status: Married    Spouse name: Not on file   Number of children: 3   Years of education: 16   Highest education level: Bachelor's degree (e.g., BA, AB, BS)  Occupational History   Occupation: Retired    Comment: Scientist, water quality strain: Not hard at all   Food insecurity:    Worry: Never true    Inability: Never true   Transportation needs:    Medical: No    Non-medical: No  Tobacco Use   Smoking status: Never Smoker   Smokeless tobacco: Never Used  Substance and Sexual Activity   Alcohol use: Yes    Alcohol/week: 7.0 standard drinks    Types: 7 Standard drinks or equivalent per week    Comment: wine nightly   Drug use: No   Sexual activity: Yes  Lifestyle   Physical activity:    Days per week: 3 days    Minutes per session: 30 min   Stress: To some extent  Relationships   Social connections:    Talks on phone: More than three times a week    Gets together: More than three times a week    Attends religious service: More than 4 times per year    Active member of club or organization: Yes    Attends meetings of clubs or organizations: More than 4 times per year    Relationship status: Married  Other Topics Concern   Not on file  Social History Narrative   Not on file     Family  History  Problem Relation Age of Onset   Heart attack Father    Osteoporosis Maternal Aunt    Cancer Mother    Early death Mother       Labs/Other Tests and Data Reviewed:    Wt Readings from Last 3 Encounters:  06/21/18 114 lb (51.7 kg)  02/15/18 115 lb (52.2 kg)  11/15/17 115 lb (52.2  kg)   Temp Readings from Last 3 Encounters:  06/21/18 97.6 F (36.4 C) (Oral)  02/15/18 98.2 F (36.8 C) (Oral)  10/04/17 98 F (36.7 C) (Oral)   BP Readings from Last 3 Encounters:  06/21/18 112/63  02/15/18 116/69  11/15/17 116/75   Pulse Readings from Last 3 Encounters:  06/21/18 71  02/15/18 72  11/15/17 69     No results found for: HGBA1C Lab Results  Component Value Date   LDLCALC 61 06/19/2018   CREATININE 0.70 06/19/2018       Chemistry      Component Value Date/Time   NA 144 06/19/2018 0905   K 4.1 06/19/2018 0905   CL 104 06/19/2018 0905   CO2 26 06/19/2018 0905   BUN 11 06/19/2018 0905   CREATININE 0.70 06/19/2018 0905   CREATININE 0.69 07/24/2013 0836      Component Value Date/Time   CALCIUM 8.6 (L) 06/19/2018 0905   ALKPHOS 50 06/19/2018 0905   AST 27 06/19/2018 0905   ALT 20 06/19/2018 0905   BILITOT 0.3 06/19/2018 0905         OBSERVATIONS/ OBJECTIVE:     The patient is alert and responded appropriately to questions asked of her.  She says her weight typically runs around 115 range and it is stable.  This is despite getting less activity and being out as much.  She does not have any blood pressure readings but usually runs low.  I did encourage her to get blood pressure readings at least once or twice a month along with weights and record these for future doctor's information.  She understands to come to the office to get CBC BMP lipid liver and to sign up to get a Cologuard done.  Physical exam deferred due to nature of telephonic visit.  ASSESSMENT & PLAN    Time:   Today, I have spent 23 minutes with the patient via telephone discussing  the above including Covid precautions.     Visit Diagnoses: 1. Mixed hyperlipidemia -Continue with therapeutic lifestyle changes and current dose of Crestor  2. Vitamin D deficiency -Continue with calcium and vitamin D replacement and weightbearing exercise  3. Hypothyroidism due to acquired atrophy of thyroid -Continue with thyroid replacement and recheck thyroid again in 6 months or at least once yearly  4. Cervical spine arthritis -No complaints today of neck pain or arthritic pain other than her hands.  5. Primary osteoarthritis involving multiple joints -Continue with Tylenol regularly and as needed NSAID with as little NSAID as possible it always after eating.  6. Osteoarthritis of both hands, unspecified osteoarthritis type -Tylenol plus as needed NSAIDs  7. Seasonal allergic rhinitis due to pollen -Continue with Flonase 1 spray each nostril daily     The above assessment and management plan was discussed with the patient. The patient verbalized understanding of and has agreed to the management plan. Patient is aware to call the clinic if symptoms persist or worsen. Patient is aware when to return to the clinic for a follow-up visit. Patient educated on when it is appropriate to go to the emergency department.    Chipper Herb, MD [facility] [address], [place], [place] [address] Ph [phone]   Arrie Senate MD

## 2018-10-26 DIAGNOSIS — H2513 Age-related nuclear cataract, bilateral: Secondary | ICD-10-CM | POA: Diagnosis not present

## 2018-10-26 DIAGNOSIS — H25013 Cortical age-related cataract, bilateral: Secondary | ICD-10-CM | POA: Diagnosis not present

## 2018-10-26 DIAGNOSIS — H04123 Dry eye syndrome of bilateral lacrimal glands: Secondary | ICD-10-CM | POA: Diagnosis not present

## 2018-11-01 ENCOUNTER — Other Ambulatory Visit: Payer: Medicare Other

## 2018-11-01 ENCOUNTER — Other Ambulatory Visit: Payer: Self-pay

## 2018-11-01 DIAGNOSIS — E559 Vitamin D deficiency, unspecified: Secondary | ICD-10-CM | POA: Diagnosis not present

## 2018-11-01 DIAGNOSIS — M19041 Primary osteoarthritis, right hand: Secondary | ICD-10-CM | POA: Diagnosis not present

## 2018-11-01 DIAGNOSIS — M159 Polyosteoarthritis, unspecified: Secondary | ICD-10-CM

## 2018-11-01 DIAGNOSIS — M8949 Other hypertrophic osteoarthropathy, multiple sites: Secondary | ICD-10-CM

## 2018-11-01 DIAGNOSIS — M47812 Spondylosis without myelopathy or radiculopathy, cervical region: Secondary | ICD-10-CM | POA: Diagnosis not present

## 2018-11-01 DIAGNOSIS — E782 Mixed hyperlipidemia: Secondary | ICD-10-CM

## 2018-11-01 DIAGNOSIS — J301 Allergic rhinitis due to pollen: Secondary | ICD-10-CM

## 2018-11-01 DIAGNOSIS — E034 Atrophy of thyroid (acquired): Secondary | ICD-10-CM

## 2018-11-01 DIAGNOSIS — M150 Primary generalized (osteo)arthritis: Secondary | ICD-10-CM | POA: Diagnosis not present

## 2018-11-01 DIAGNOSIS — M19042 Primary osteoarthritis, left hand: Secondary | ICD-10-CM

## 2018-11-02 LAB — HEPATIC FUNCTION PANEL
ALT: 15 IU/L (ref 0–32)
AST: 21 IU/L (ref 0–40)
Albumin: 4.5 g/dL (ref 3.7–4.7)
Alkaline Phosphatase: 53 IU/L (ref 39–117)
Bilirubin Total: 0.5 mg/dL (ref 0.0–1.2)
Bilirubin, Direct: 0.17 mg/dL (ref 0.00–0.40)
Total Protein: 6.6 g/dL (ref 6.0–8.5)

## 2018-11-02 LAB — BMP8+EGFR
BUN/Creatinine Ratio: 20 (ref 12–28)
BUN: 15 mg/dL (ref 8–27)
CO2: 29 mmol/L (ref 20–29)
Calcium: 9.3 mg/dL (ref 8.7–10.3)
Chloride: 102 mmol/L (ref 96–106)
Creatinine, Ser: 0.76 mg/dL (ref 0.57–1.00)
GFR calc Af Amer: 89 mL/min/{1.73_m2} (ref >59)
GFR calc non Af Amer: 78 mL/min/{1.73_m2} (ref >59)
Glucose: 73 mg/dL (ref 65–99)
Potassium: 4 mmol/L (ref 3.5–5.2)
Sodium: 143 mmol/L (ref 134–144)

## 2018-11-02 LAB — CBC WITH DIFFERENTIAL/PLATELET
Basophils Absolute: 0 10*3/uL (ref 0.0–0.2)
Basos: 1 %
EOS (ABSOLUTE): 0.2 10*3/uL (ref 0.0–0.4)
Eos: 4 %
Hematocrit: 47.3 % — ABNORMAL HIGH (ref 34.0–46.6)
Hemoglobin: 15.3 g/dL (ref 11.1–15.9)
Immature Grans (Abs): 0 10*3/uL (ref 0.0–0.1)
Immature Granulocytes: 0 %
Lymphocytes Absolute: 1.3 10*3/uL (ref 0.7–3.1)
Lymphs: 24 %
MCH: 29.4 pg (ref 26.6–33.0)
MCHC: 32.3 g/dL (ref 31.5–35.7)
MCV: 91 fL (ref 79–97)
Monocytes Absolute: 0.7 10*3/uL (ref 0.1–0.9)
Monocytes: 12 %
Neutrophils Absolute: 3.1 10*3/uL (ref 1.4–7.0)
Neutrophils: 59 %
Platelets: 182 10*3/uL (ref 150–450)
RBC: 5.2 x10E6/uL (ref 3.77–5.28)
RDW: 14.6 % (ref 11.7–15.4)
WBC: 5.3 10*3/uL (ref 3.4–10.8)

## 2018-11-02 LAB — LIPID PANEL
Chol/HDL Ratio: 2 ratio (ref 0.0–4.4)
Cholesterol, Total: 166 mg/dL (ref 100–199)
HDL: 83 mg/dL (ref >39)
LDL Calculated: 69 mg/dL (ref 0–99)
Triglycerides: 69 mg/dL (ref 0–149)
VLDL Cholesterol Cal: 14 mg/dL (ref 5–40)

## 2018-11-02 LAB — VITAMIN D 25 HYDROXY (VIT D DEFICIENCY, FRACTURES): Vit D, 25-Hydroxy: 70.2 ng/mL (ref 30.0–100.0)

## 2018-11-08 ENCOUNTER — Other Ambulatory Visit: Payer: Self-pay | Admitting: Family Medicine

## 2018-11-30 ENCOUNTER — Other Ambulatory Visit: Payer: Self-pay | Admitting: *Deleted

## 2018-11-30 MED ORDER — ROSUVASTATIN CALCIUM 5 MG PO TABS: ORAL_TABLET | ORAL | 0 refills | Status: DC

## 2018-11-30 MED ORDER — ESTRADIOL 0.1 MG/GM VA CREA: TOPICAL_CREAM | VAGINAL | 4 refills | Status: DC

## 2018-12-01 ENCOUNTER — Ambulatory Visit (INDEPENDENT_AMBULATORY_CARE_PROVIDER_SITE_OTHER): Payer: Medicare Other | Admitting: *Deleted

## 2018-12-01 VITALS — BP 112/63 | HR 70 | Ht 65.0 in | Wt 114.0 lb

## 2018-12-01 DIAGNOSIS — Z0000 Encounter for general adult medical examination without abnormal findings: Secondary | ICD-10-CM

## 2018-12-01 NOTE — Patient Instructions (Signed)
  Kayla Randolph , Thank you for taking time to come for your Medicare Wellness Visit. I appreciate your ongoing commitment to your health goals. Please review the following plan we discussed and let me know if I can assist you in the future.   These are the goals we discussed: Goals    . Exercise 3x per week (30 min per time)    . Have 3 meals a day       This is a list of the screening recommended for you and due dates:  Health Maintenance  Topic Date Due  .  Hepatitis C: One time screening is recommended by Center for Disease Control  (CDC) for  adults born from [birth year] through 1966.   15-Feb-1946  . Flu Shot  12/16/2018  . Pap Smear  02/15/2019  . Stool Blood Test  03/02/2019  . Mammogram  02/17/2020  . Colon Cancer Screening  11/14/2021  . Tetanus Vaccine  06/09/2028  . DEXA scan (bone density measurement)  Completed  . Pneumonia vaccines  Completed

## 2018-12-01 NOTE — Progress Notes (Signed)
MEDICARE ANNUAL WELLNESS VISIT  12/01/2018  Telephone Visit Disclaimer This Medicare AWV was conducted by telephone due to national recommendations for restrictions regarding the COVID-19 Pandemic (e.g. social distancing).  I verified, using two identifiers, that I am speaking with Kayla Randolph or their authorized healthcare agent. I discussed the limitations, risks, security, and privacy concerns of performing an evaluation and management service by telephone and the potential availability of an in-person appointment in the future. The patient expressed understanding and agreed to proceed.   Subjective:  Kayla Randolph is a 73 y.o. female patient of Janora Norlander, DO who had a Medicare Annual Wellness Visit today via telephone. Terie is Retired and lives with their spouse. she has 2 children. she reports that she is socially active and does interact with friends/family regularly. she is minimally physically active and enjoys reading.  Patient Care Team: Janora Norlander, DO as PCP - General (Family Medicine) Rutherford Guys, MD as Consulting Physician (Ophthalmology) Clarene Essex, MD as Consulting Physician (Gastroenterology) Lavonna Monarch, MD as Consulting Physician (Dermatology)  Advanced Directives 12/01/2018 11/15/2017 10/07/2016  Does Patient Have a Medical Advance Directive? Yes Yes Yes  Type of Advance Directive Living will;Healthcare Power of Attorney Living will;Healthcare Power of Attorney Living will;Healthcare Power of Attorney  Does patient want to make changes to medical advance directive? No - Patient declined No - Patient declined Yes (Inpatient - patient defers changing a medical advance directive at this time)  Copy of [name] in Chart? No - copy requested No - copy requested No - copy requested  Would patient like information on creating a medical advance directive? No - Patient declined [facility] Utilization Over the Past 12 Months: #  of hospitalizations or ER visits: 0 # of surgeries: 0  Review of Systems    Patient reports that her overall health is unchanged compared to last year.  Patient Reported Readings (BP, Pulse, CBG, Weight, etc) BP 112/63   Pulse 70   Ht 5\' 5"  (1.651 m)   Wt 114 lb (51.7 kg)   BMI 18.97 kg/m    Review of Systems: General ROS: negative  All other systems negative.  Pain Assessment       Current Medications & Allergies (verified) Allergies as of 12/01/2018      Reactions   Macrodantin    Sulfa Antibiotics    Wellbutrin [bupropion Hcl]       Medication List       Accurate as of December 01, 2018  9:35 AM. If you have any questions, ask your nurse or doctor.        cycloSPORINE 0.05 % ophthalmic emulsion Commonly known as: RESTASIS Place 1 drop into both eyes 2 (two) times daily.   escitalopram 10 MG tablet Commonly known as: LEXAPRO TAKE (1) TABLET DAILY AS DIRECTED.   estradiol 0.1 MG/GM vaginal cream Commonly known as: ESTRACE INSERT 1 VAGINALLY AT BEDTIME   fluticasone 50 MCG/ACT nasal spray Commonly known as: FLONASE 1 SPRAY IN EACH NOSTRIL EVERY 12 HOURS   levothyroxine 25 MCG tablet Commonly known as: SYNTHROID Take 1 tablet (25 mcg total) by mouth daily before breakfast.   meloxicam 15 MG tablet Commonly known as: MOBIC Take one half to one daily after eating as directed   MIRALAX PO Take 1 packet by mouth daily as needed.   One-A-Day Bone Strength 500-28-100 MG-MG-UNIT Tabs Take 1 tablet by mouth 3 (three) times daily.  rosuvastatin 5 MG tablet Commonly known as: CRESTOR TAKE (1) TABLET DAILY AS DIRECTED.   SUPER OMEGA-3 PO Take by mouth.   TURMERIC COMPLEX/BLACK PEPPER PO Take by mouth.   UNABLE TO FIND Med Name: Prelief Bladder   Vitamin D3 25 MCG (1000 UT) Caps Take 2 capsules by mouth daily.   WOMENS 50+ MULTI VITAMIN/MIN PO Take by mouth.       History (reviewed): Past Medical History:  Diagnosis Date  . Allergy   .  History of colon polyps   . Hyperlipidemia   . Interstitial cystitis    History of  . Osteopenia   . Vitamin D deficiency    Past Surgical History:  Procedure Laterality Date  . CESAREAN SECTION    . TONSILLECTOMY     Family History  Problem Relation Age of Onset  . Heart attack Father   . Congestive Heart Failure Father   . Osteoporosis Maternal Aunt   . Cancer Mother        possible lung - spread   . Early death Mother   . Stroke Maternal Grandmother   . Stroke Paternal Grandfather    Social History   Socioeconomic History  . Marital status: Married    Spouse name: Ed  . Number of children: 2  . Years of education: 16  . Highest education level: Bachelor's degree (e.g., BA, AB, BS)  Occupational History  . Occupation: Retired    Comment: Printmaker and tutoring  Social Needs  . Financial resource strain: Not hard at all  . Food insecurity    Worry: Never true    Inability: Never true  . Transportation needs    Medical: No    Non-medical: No  Tobacco Use  . Smoking status: Never Smoker  . Smokeless tobacco: Never Used  Substance and Sexual Activity  . Alcohol use: Yes    Alcohol/week: 7.0 standard drinks    Types: 7 Standard drinks or equivalent per week    Comment: wine nightly  . Drug use: No  . Sexual activity: Yes  Lifestyle  . Physical activity    Days per week: 3 days    Minutes per session: 30 min  . Stress: To some extent  Relationships  . Social connections    Talks on phone: More than three times a week    Gets together: More than three times a week    Attends religious service: More than 4 times per year    Active member of club or organization: Yes    Attends meetings of clubs or organizations: More than 4 times per year    Relationship status: Married  Other Topics Concern  . Not on file  Social History Narrative   Lives with Ed   2 cats    2 children = neither local     Activities of Daily Living In your present state of health,  do you have any difficulty performing the following activities: 12/01/2018  Hearing? N  Vision? Y  Comment wears glasses  Difficulty concentrating or making decisions? N  Walking or climbing stairs? N  Dressing or bathing? N  Doing errands, shopping? N  Preparing Food and eating ? N  Using the Toilet? N  In the past six months, have you accidently leaked urine? N  Do you have problems with loss of bowel control? N  Managing your Medications? N  Managing your Finances? N  Housekeeping or managing your Housekeeping? N  Some recent data might be  hidden    Patient Education/ Literacy    Exercise Current Exercise Habits: The patient does not participate in regular exercise at present, Exercise limited by: None identified  Diet Patient reports consuming 3 meals a day and 0 snack(s) a day Patient reports that her primary diet is: Regular Patient reports that she does have regular access to food.   Depression Screen PHQ 2/9 Scores 12/01/2018 06/21/2018 02/15/2018 11/15/2017 10/04/2017 03/10/2017 03/10/2017  PHQ - 2 Score 0 0 0 0 0 0 0     Fall Risk Fall Risk  12/01/2018 06/21/2018 02/15/2018 11/15/2017 10/04/2017  Falls in the past year? 0 1 No Yes Yes  Number falls in past yr: - 0 - 1 1  Injury with Fall? - 0 - Yes Yes  Risk Factor Category  - - - High Fall Risk -  Risk for fall due to : - - - History of fall(s) -  Follow up - - - Falls prevention discussed -     Objective:  Kayla Randolph seemed alert and oriented and she participated appropriately during our telephone visit.  Blood Pressure Weight BMI  BP Readings from Last 3 Encounters:  12/01/18 112/63  06/21/18 112/63  02/15/18 116/69   Wt Readings from Last 3 Encounters:  12/01/18 114 lb (51.7 kg)  06/21/18 114 lb (51.7 kg)  02/15/18 115 lb (52.2 kg)   BMI Readings from Last 1 Encounters:  12/01/18 18.97 kg/m    *Unable to obtain current vital signs, weight, and BMI due to telephone visit type  Hearing/Vision  . Enma  did not seem to have difficulty with hearing/understanding during the telephone conversation . Reports that she has had a formal eye exam by an eye care professional within the past year . Reports that she has not had a formal hearing evaluation within the past year *Unable to fully assess hearing and vision during telephone visit type  Cognitive Function: 6CIT Screen 12/01/2018  What Year? 0 points  What month? 0 points  What time? 0 points  Count back from 20 0 points  Months in reverse 0 points  Repeat phrase 0 points  Total Score 0   (Normal:0-7, Significant for Dysfunction: >8)  Normal Cognitive Function Screening: Yes   Immunization & Health Maintenance Record Immunization History  Administered Date(s) Administered  . Influenza Whole 02/14/2010  . Influenza, High Dose Seasonal PF 03/18/2016, 02/17/2017, 03/07/2018  . Influenza,inj,Quad PF,6+ Mos 04/03/2013, 03/14/2014, 03/28/2015  . Pneumococcal Conjugate-13 05/15/2013  . Pneumococcal Polysaccharide-23 08/15/2009  . Td 06/09/2018  . Tdap 05/18/2007  . Zoster 06/09/2006  . Zoster Recombinat (Shingrix) 10/07/2016, 03/24/2017    Health Maintenance  Topic Date Due  . Hepatitis C Screening  08/19/1945  . INFLUENZA VACCINE  12/16/2018  . PAP SMEAR-Modifier  02/15/2019  . COLON CANCER SCREENING ANNUAL FOBT  03/02/2019  . MAMMOGRAM  02/17/2020  . COLONOSCOPY  11/14/2021  . TETANUS/TDAP  06/09/2028  . DEXA SCAN  Completed  . PNA vac Low Risk Adult  Completed       Assessment  This is a routine wellness examination for [employer].  Health Maintenance: Due or Overdue Health Maintenance Due  Topic Date Due  . Hepatitis C Screening  07-05-1945    Kayla Randolph does not need a referral for Community Assistance: Care Management:   no Social Work:    no Prescription Assistance:  no Nutrition/Diabetes Education:  no   Plan:  Personalized Goals Goals Addressed  This Visit's Progress   . Exercise  3x per week (30 min per time)   On track   . Have 3 meals a day   On track     Personalized Health Maintenance & Screening Recommendations  up to date  Lung Cancer Screening Recommended: no (Low Dose CT Chest recommended if Age 39-80 years, 30 pack-year currently smoking OR have quit w/in past 15 years) Hepatitis C Screening recommended: no HIV Screening recommended: no  Advanced Directives: Written information was not prepared per patient's request.  Referrals & Orders No orders of the defined types were placed in this encounter.   Follow-up Plan . Follow-up with Janora Norlander, DO as planned    I have personally reviewed and noted the following in the patient's chart:   . Medical and social history . Use of alcohol, tobacco or illicit drugs  . Current medications and supplements . Functional ability and status . Nutritional status . Physical activity . Advanced directives . List of other physicians . Hospitalizations, surgeries, and ER visits in previous 12 months . Vitals . Screenings to include cognitive, depression, and falls . Referrals and appointments  In addition, I have reviewed and discussed with Kayla Randolph certain preventive protocols, quality metrics, and best practice recommendations. A written personalized care plan for preventive services as well as general preventive health recommendations is available and can be mailed to the patient at her request.      [place] Dec  12/01/2018

## 2018-12-20 DIAGNOSIS — H25011 Cortical age-related cataract, right eye: Secondary | ICD-10-CM | POA: Diagnosis not present

## 2018-12-20 DIAGNOSIS — H2511 Age-related nuclear cataract, right eye: Secondary | ICD-10-CM | POA: Diagnosis not present

## 2018-12-28 ENCOUNTER — Other Ambulatory Visit: Payer: Self-pay | Admitting: *Deleted

## 2018-12-28 MED ORDER — ESTRADIOL 0.1 MG/GM VA CREA: TOPICAL_CREAM | VAGINAL | 0 refills | Status: DC

## 2019-02-01 DIAGNOSIS — H59031 Cystoid macular edema following cataract surgery, right eye: Secondary | ICD-10-CM | POA: Diagnosis not present

## 2019-02-01 DIAGNOSIS — H26491 Other secondary cataract, right eye: Secondary | ICD-10-CM | POA: Diagnosis not present

## 2019-02-01 DIAGNOSIS — Z961 Presence of intraocular lens: Secondary | ICD-10-CM | POA: Diagnosis not present

## 2019-02-01 DIAGNOSIS — H2512 Age-related nuclear cataract, left eye: Secondary | ICD-10-CM | POA: Diagnosis not present

## 2019-02-06 DIAGNOSIS — C4491 Basal cell carcinoma of skin, unspecified: Secondary | ICD-10-CM

## 2019-02-06 DIAGNOSIS — L821 Other seborrheic keratosis: Secondary | ICD-10-CM | POA: Diagnosis not present

## 2019-02-06 DIAGNOSIS — D229 Melanocytic nevi, unspecified: Secondary | ICD-10-CM | POA: Diagnosis not present

## 2019-02-06 DIAGNOSIS — C44311 Basal cell carcinoma of skin of nose: Secondary | ICD-10-CM | POA: Diagnosis not present

## 2019-02-06 HISTORY — DX: Basal cell carcinoma of skin, unspecified: C44.91

## 2019-02-12 ENCOUNTER — Other Ambulatory Visit: Payer: Self-pay | Admitting: Family Medicine

## 2019-02-12 DIAGNOSIS — Z1231 Encounter for screening mammogram for malignant neoplasm of breast: Secondary | ICD-10-CM

## 2019-02-27 ENCOUNTER — Ambulatory Visit: Payer: Medicare Other | Admitting: Family Medicine

## 2019-02-28 ENCOUNTER — Other Ambulatory Visit: Payer: Self-pay

## 2019-03-01 ENCOUNTER — Encounter: Payer: Self-pay | Admitting: Family Medicine

## 2019-03-01 ENCOUNTER — Ambulatory Visit (INDEPENDENT_AMBULATORY_CARE_PROVIDER_SITE_OTHER): Payer: Medicare Other | Admitting: Family Medicine

## 2019-03-01 VITALS — BP 108/60 | HR 65 | Temp 98.6°F | Ht 65.0 in | Wt 113.0 lb

## 2019-03-01 DIAGNOSIS — M7741 Metatarsalgia, right foot: Secondary | ICD-10-CM

## 2019-03-01 DIAGNOSIS — F329 Major depressive disorder, single episode, unspecified: Secondary | ICD-10-CM

## 2019-03-01 DIAGNOSIS — F32A Depression, unspecified: Secondary | ICD-10-CM

## 2019-03-01 DIAGNOSIS — Z1159 Encounter for screening for other viral diseases: Secondary | ICD-10-CM | POA: Diagnosis not present

## 2019-03-01 DIAGNOSIS — Z23 Encounter for immunization: Secondary | ICD-10-CM

## 2019-03-01 DIAGNOSIS — E034 Atrophy of thyroid (acquired): Secondary | ICD-10-CM

## 2019-03-01 DIAGNOSIS — E782 Mixed hyperlipidemia: Secondary | ICD-10-CM

## 2019-03-01 MED ORDER — ESCITALOPRAM OXALATE 10 MG PO TABS: ORAL_TABLET | ORAL | 3 refills | Status: DC

## 2019-03-01 MED ORDER — ESTRADIOL 0.1 MG/GM VA CREA: TOPICAL_CREAM | VAGINAL | 1 refills | Status: DC

## 2019-03-01 MED ORDER — ROSUVASTATIN CALCIUM 5 MG PO TABS: ORAL_TABLET | ORAL | 3 refills | Status: DC

## 2019-03-01 NOTE — Progress Notes (Signed)
Subjective: CC: Establish care, right foot pain, hypothyroidism PCP: Janora Norlander, DO [id number] H Voorhees is a 73 y.o. female presenting to clinic today for:  1.  Hypothyroidism Incidentally found on routine laboratory work-up.  Patient denies ever having been symptomatic.  No change in voice, difficulty swallowing, heart palpitations, tremor, unplanned weight change, change in bowel habits.  She reports compliance with Synthroid 25 mcg daily.  2.  Hyperlipidemia Patient reports compliance with Crestor 5 mg daily.  No chest pain, shortness of breath.  Needs refills.  3.  Right foot pain Patient reports couple week history of right-sided foot pain.  She points to the plantar aspect of the right foot between digits 2, 3 and 4.  She notes radiation to the second digit.  She had a palpable knot in this area but this is since gotten better.  She only can wear certain shoes without symptoms being exacerbated.  She notes that pain seems to be worse when she gets out of bed in the morning.  She often has to hobble around as a result.  She occasionally takes ibuprofen.  She has meloxicam on hand but does not use this very often.   ROS: Per HPI  Allergies  Allergen Reactions  . Macrodantin   . Sulfa Antibiotics   . Wellbutrin [Bupropion Hcl]    Past Medical History:  Diagnosis Date  . Allergy   . History of colon polyps   . Hyperlipidemia   . Interstitial cystitis    History of  . Osteopenia   . Vitamin D deficiency     Current Outpatient Medications:  .  Black Pepper-Turmeric (TURMERIC COMPLEX/BLACK PEPPER PO), Take by mouth., Disp: , Rfl:  .  Cholecalciferol (VITAMIN D3) 1000 UNITS CAPS, Take 2 capsules by mouth daily. , Disp: , Rfl:  .  cycloSPORINE (RESTASIS) 0.05 % ophthalmic emulsion, Place 1 drop into both eyes 2 (two) times daily., Disp: , Rfl:  .  escitalopram (LEXAPRO) 10 MG tablet, TAKE (1) TABLET DAILY AS DIRECTED., Disp: 90 tablet, Rfl: 0 .  estradiol (ESTRACE) 0.1  MG/GM vaginal cream, INSERT 1 VAGINALLY AT BEDTIME, Disp: 127.5 g, Rfl: 0 .  fluticasone (FLONASE) 50 MCG/ACT nasal spray, 1 SPRAY IN EACH NOSTRIL EVERY 12 HOURS, Disp: 48 g, Rfl: 3 .  levothyroxine (SYNTHROID, LEVOTHROID) 25 MCG tablet, Take 1 tablet (25 mcg total) by mouth daily before breakfast., Disp: 90 tablet, Rfl: 3 .  meloxicam (MOBIC) 15 MG tablet, Take one half to one daily after eating as directed (Patient not taking: Reported on 12/01/2018), Disp: 30 tablet, Rfl: 3 .  Multiple Vitamins-Minerals (WOMENS 50+ MULTI VITAMIN/MIN PO), Take by mouth., Disp: , Rfl:  .  Omega-3 Fatty Acids (SUPER OMEGA-3 PO), Take by mouth., Disp: , Rfl:  .  Polyethylene Glycol 3350 (MIRALAX PO), Take 1 packet by mouth daily as needed.  , Disp: , Rfl:  .  rosuvastatin (CRESTOR) 5 MG tablet, TAKE (1) TABLET DAILY AS DIRECTED., Disp: 90 tablet, Rfl: 0 .  Specialty Vitamins Products (ONE-A-DAY BONE STRENGTH) 500-28-100 MG-MG-UNIT TABS, Take 1 tablet by mouth 3 (three) times daily., Disp: , Rfl:  .  UNABLE TO FIND, Med Name: Prelief Bladder, Disp: , Rfl:  Social History   Socioeconomic History  . Marital status: Married    Spouse name: Ed  . Number of children: 2  . Years of education: 16  . Highest education level: Bachelor's degree (e.g., BA, AB, BS)  Occupational History  . Occupation: Retired  Comment: Teaching and tutoring  Social Needs  . Financial resource strain: Not hard at all  . Food insecurity    Worry: Never true    Inability: Never true  . Transportation needs    Medical: No    Non-medical: No  Tobacco Use  . Smoking status: Never Smoker  . Smokeless tobacco: Never Used  Substance and Sexual Activity  . Alcohol use: Yes    Alcohol/week: 7.0 standard drinks    Types: 7 Standard drinks or equivalent per week    Comment: wine nightly  . Drug use: No  . Sexual activity: Yes  Lifestyle  . Physical activity    Days per week: 3 days    Minutes per session: 30 min  . Stress: To some  extent  Relationships  . Social connections    Talks on phone: More than three times a week    Gets together: More than three times a week    Attends religious service: More than 4 times per year    Active member of club or organization: Yes    Attends meetings of clubs or organizations: More than 4 times per year    Relationship status: Married  . Intimate partner violence    Fear of current or ex partner: No    Emotionally abused: No    Physically abused: No    Forced sexual activity: No  Other Topics Concern  . Not on file  Social History Narrative   Lives with Ed   2 cats    2 children = neither local    Family History  Problem Relation Age of Onset  . Heart attack Father   . Congestive Heart Failure Father   . Osteoporosis Maternal Aunt   . Cancer Mother        possible lung - spread   . Early death Mother   . Stroke Maternal Grandmother   . Stroke Paternal Grandfather     Objective: Office vital signs reviewed. BP 108/60   Pulse 65   Temp 98.6 F (37 C) (Temporal)   Ht 5\' 5"  (1.651 m)   Wt 113 lb (51.3 kg)   SpO2 100%   BMI 18.80 kg/m   Physical Examination:  General: Awake, alert, well nourished, No acute distress HEENT: Normal; no exophthalmos.  No palpable nodules on the thyroid and no visible goiter. Cardio: regular rate and rhythm, S1S2 heard, no murmurs appreciated Pulm: clear to auscultation bilaterally, no wheezes, rhonchi or rales; normal work of breathing on room air Extremities: warm, well perfused, No edema, cyanosis or clubbing; +2 pulses bilaterally MSK: Normal gait and station  Right foot: No gross deformities, discolorations noted.  She has no significant tenderness palpation to the plantar aspect of the forefoot.  No palpable bony abnormalities.  No soft tissue swelling.  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 Skin:  dry; intact; no rashes or lesions; normal temperature Neuro: no tremor  Assessment/ Plan: 73 y.o. female   1. Hypothyroidism due to acquired atrophy of thyroid Asymptomatic.  Check thyroid panel. - Thyroid Panel With TSH  2. Depression, unspecified depression type Controlled with Lexapro.  Renewal sent  3. Mixed hyperlipidemia Plan for fasting labs at next visit.  Crestor renewed  4. Encounter for hepatitis C screening test for low risk patient No known exposures.  Screening performed - Hepatitis C antibody  5. Metatarsalgia, right foot Recommended metatarsal pad.  I have also recommended consideration for topical Voltaren gel.  We discussed the risk of GI bleed with concomitant use of oral SSRIs and oral NSAIDs.  If no significant improvement or if worsening, plan for referral to orthopedics for formal evaluation and consideration of orthotics.  No orders of the defined types were placed in this encounter.  No orders of the defined types were placed in this encounter.    Janora Norlander, DO [place] [phone]

## 2019-03-01 NOTE — Patient Instructions (Signed)
Get a metatarsal pad for your right foot.  You probably need a small/ med. You can purchase Voltaren gel over the counter now.  You can use on any of your painful joints up to 4 times daily if needed.  We talked about NSAIDs (motrin, aleve, mobic, etc) and your Lexapro.  Together these can increase risk of GI bleeds.  Plan for fasting labs at next visit.  We can perform a pelvic exam at that time as well if needed.  You had labs performed today.  You will be contacted with the results of the labs once they are available, usually in the next 3 business days for routine lab work.  If you have an active my chart account, they will be released to your MyChart.  If you prefer to have these labs released to you via telephone, please let us know.  If you had a pap smear or biopsy performed, expect to be contacted in about 7-10 days.

## 2019-03-02 ENCOUNTER — Other Ambulatory Visit: Payer: Self-pay

## 2019-03-02 ENCOUNTER — Ambulatory Visit
Admission: RE | Admit: 2019-03-02 | Discharge: 2019-03-02 | Disposition: A | Discharge status: Home / Self Care | Payer: Medicare Other | Source: Ambulatory Visit | Attending: Family Medicine | Admitting: Family Medicine

## 2019-03-02 DIAGNOSIS — Z1231 Encounter for screening mammogram for malignant neoplasm of breast: Secondary | ICD-10-CM

## 2019-03-02 LAB — THYROID PANEL WITH TSH
Free Thyroxine Index: 1.6 (ref 1.2–4.9)
T3 Uptake Ratio: 27 % (ref 24–39)
T4, Total: 6.1 ug/dL (ref 4.5–12.0)
TSH: 2.62 u[IU]/mL (ref 0.450–4.500)

## 2019-03-02 LAB — HEPATITIS C ANTIBODY: Hep C Virus Ab: <0.1 s/co ratio (ref 0.0–0.9)

## 2019-03-06 ENCOUNTER — Other Ambulatory Visit: Payer: Self-pay | Admitting: Family Medicine

## 2019-03-06 DIAGNOSIS — R928 Other abnormal and inconclusive findings on diagnostic imaging of breast: Secondary | ICD-10-CM

## 2019-03-07 DIAGNOSIS — H26491 Other secondary cataract, right eye: Secondary | ICD-10-CM | POA: Diagnosis not present

## 2019-03-07 DIAGNOSIS — H59031 Cystoid macular edema following cataract surgery, right eye: Secondary | ICD-10-CM | POA: Diagnosis not present

## 2019-03-07 DIAGNOSIS — H2512 Age-related nuclear cataract, left eye: Secondary | ICD-10-CM | POA: Diagnosis not present

## 2019-03-09 ENCOUNTER — Ambulatory Visit
Admission: RE | Admit: 2019-03-09 | Discharge: 2019-03-09 | Disposition: A | Discharge status: Home / Self Care | Payer: Medicare Other | Source: Ambulatory Visit | Attending: Family Medicine | Admitting: Family Medicine

## 2019-03-09 ENCOUNTER — Other Ambulatory Visit: Payer: Self-pay | Admitting: Family Medicine

## 2019-03-09 ENCOUNTER — Other Ambulatory Visit: Payer: Self-pay

## 2019-03-09 DIAGNOSIS — R928 Other abnormal and inconclusive findings on diagnostic imaging of breast: Secondary | ICD-10-CM

## 2019-03-09 DIAGNOSIS — R921 Mammographic calcification found on diagnostic imaging of breast: Secondary | ICD-10-CM | POA: Diagnosis not present

## 2019-03-09 DIAGNOSIS — R922 Inconclusive mammogram: Secondary | ICD-10-CM | POA: Diagnosis not present

## 2019-03-09 DIAGNOSIS — N6323 Unspecified lump in the left breast, lower outer quadrant: Secondary | ICD-10-CM | POA: Diagnosis not present

## 2019-03-09 DIAGNOSIS — N6320 Unspecified lump in the left breast, unspecified quadrant: Secondary | ICD-10-CM

## 2019-03-13 ENCOUNTER — Ambulatory Visit
Admission: RE | Admit: 2019-03-13 | Discharge: 2019-03-13 | Disposition: A | Discharge status: Home / Self Care | Payer: Medicare Other | Source: Ambulatory Visit | Attending: Family Medicine | Admitting: Family Medicine

## 2019-03-13 ENCOUNTER — Other Ambulatory Visit: Payer: Self-pay

## 2019-03-13 DIAGNOSIS — N6320 Unspecified lump in the left breast, unspecified quadrant: Secondary | ICD-10-CM

## 2019-03-13 DIAGNOSIS — N6323 Unspecified lump in the left breast, lower outer quadrant: Secondary | ICD-10-CM | POA: Diagnosis not present

## 2019-03-15 ENCOUNTER — Telehealth: Payer: Self-pay | Admitting: Oncology

## 2019-03-15 NOTE — Telephone Encounter (Signed)
Spoke with patient to confirm afternoon [facility] appointment for 11/4, packet will be mailed to patient

## 2019-03-16 ENCOUNTER — Encounter: Payer: Self-pay | Admitting: *Deleted

## 2019-03-16 DIAGNOSIS — C50212 Malignant neoplasm of upper-inner quadrant of left female breast: Secondary | ICD-10-CM | POA: Insufficient documentation

## 2019-03-16 DIAGNOSIS — Z170 Estrogen receptor positive status [ER+]: Secondary | ICD-10-CM | POA: Insufficient documentation

## 2019-03-20 NOTE — Progress Notes (Signed)
[facility]  [phone] [phone]     ID: PRESLEA BRASCH DOB: Jul 05, 1945  MR#: [id number]  [name]  Patient Care Team: [name], DO as PCP - General (Family Medicine) Jethro Bolus, MD as Consulting Physician (Ophthalmology) Vida Rigger, MD as Consulting Physician (Gastroenterology) Janalyn Harder, MD as Consulting Physician (Dermatology) Pershing Proud, RN as Oncology Nurse Navigator Donnelly Angelica, RN as Oncology Nurse Navigator Isa Kohlenberg, Valentino Hue, MD as Consulting Physician (Oncology) Jethro Bolus, MD as Consulting Physician (Ophthalmology) Dorothy Puffer, MD as Consulting Physician (Radiation Oncology) Lowella Dell, MD OTHER MD:  CHIEF COMPLAINT: estrogen receptor positive breast cancer  CURRENT TREATMENT: Awaiting definitive surgery   HISTORY OF CURRENT ILLNESS: "Shawnn" had routine screening mammography on 03/02/2019 showing a possible abnormality in the left breast. She underwent left diagnostic mammography with tomography and left breast ultrasonography [facility] on 03/09/2019 showing: breast density category C; suspicious 1.1 cm left breast mass at 3:30 with associated punctate calcifications; no left axillary adenopathy.  Accordingly on 03/13/2019 she proceeded to biopsy of the left breast area in question. The pathology from this procedure ([id number]) showed: invasive ductal carcinoma, grade 2. Prognostic indicators significant for: estrogen receptor, 100% positive and progesterone receptor, 100% positive, both with strong staining intensity. Proliferation marker Ki67 at 30%. HER2 negative by immunohistochemistry (1+).  The patient's subsequent history is as detailed below.   INTERVAL HISTORY: Tiahna was evaluated in the multidisciplinary breast cancer clinic on 03/21/2019 accompanied by her husband Ed. Her case was also presented at the multidisciplinary breast cancer conference on the same day. At that  time a preliminary plan was proposed: Breast conserving surgery, Oncotype, consideration of adjuvant radiation, antiestrogens  Her most recent bone density screening was performed on 06/21/2018. This showed a T-score of -2.0, which is considered osteopenic.  She reports she is scheduled to undergo surgery for basal cell carcinoma on her left nostril on 04/26/2019.   REVIEW OF SYSTEMS: Falon reports wearing glasses, back and joint pain, arthritis, anxiety, and thyroid problems. There were no specific symptoms leading to the original mammogram, which was routinely scheduled. The patient denies unusual headaches, visual changes, nausea, vomiting, stiff neck, dizziness, or gait imbalance. There has been no cough, phlegm production, or pleurisy, no chest pain or pressure, and no change in bowel or bladder habits. The patient denies fever, rash, bleeding, unexplained fatigue or unexplained weight loss. A detailed review of systems was otherwise entirely negative. skin cancer, back and joint pain, arthritis, anxiety, and thyroid problems. There were no specific symptoms leading to the original mammogram, which was routinely scheduled. The patient denies unusual headaches, visual changes, nausea, vomiting, stiff neck, dizziness, or gait imbalance. There has been no cough, phlegm production, or pleurisy, no chest pain or pressure, and no change in bowel or bladder habits. The patient denies fever, rash, bleeding, unexplained fatigue or unexplained weight loss. A detailed review of systems was otherwise entirely negative.   PAST MEDICAL HISTORY: Past Medical History:  Diagnosis Date   Allergy    History of colon polyps    Hyperlipidemia    Interstitial cystitis    History of   Osteopenia    Vitamin D deficiency     PAST SURGICAL HISTORY: Past Surgical History:  Procedure Laterality Date   CESAREAN SECTION     TONSILLECTOMY      FAMILY HISTORY: Family History  Problem Relation Age of Onset   Heart attack Father    Congestive Heart Failure Father    Osteoporosis Maternal Aunt    Uterine cancer Maternal Aunt    Cancer Mother        possible lung - spread    Early death Mother    Stroke Maternal Grandmother    Breast cancer Paternal Grandmother    Stroke Paternal Grandfather    Patient's father was 48 years old when he died from congestive heart failure. Patient's mother died from cancer, type unknown to the patient,  at age 64. The patient denies a family hx of ovarian cancer. She reports a maternal aunt was  diagnosed with uterine cancer (age unsure), and her paternal grandmother with breast cancer at a very young age (exact age unsure).  The patient has no siblings.   GYNECOLOGIC HISTORY:  No LMP recorded. Patient is postmenopausal. Menarche: 73 years old Age at first live birth: 73 years old GX P 2 LMP age [age] Contraceptive: yes, used pills on and off from age [age] without issue. HRT: yes, for about 5 years, until age 10 or 9.  Hysterectomy? no BSO? 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  Husband Ed is retired Solicitor in the [employer].. She lives at home with Ed and 2 cats. Daughter [name], age 29, lives in [place] as a homemaker. Son Stonega, age 70, lives in [place], [place] as a IT trainer. She attends [employer] in [place]    ADVANCED DIRECTIVES: In the absence of any documentation to the contrary, the patient's spouse is their HCPOA.   HEALTH MAINTENANCE: Social History   Tobacco Use   Smoking status: Never Smoker   Smokeless tobacco: Never Used  Substance Use Topics   Alcohol use: Yes    Alcohol/week: 7.0 standard drinks    Types: 7 Standard drinks or equivalent per week    Comment: wine nightly   Drug use: No     Colonoscopy: 09/2011  PAP: 2019  Bone density: 06/2018, -2.0   Allergies  Allergen Reactions   Macrodantin    Sulfa Antibiotics    Wellbutrin [Bupropion Hcl]     Current Outpatient Medications  Medication Sig Dispense Refill   Black Pepper-Turmeric (TURMERIC COMPLEX/BLACK PEPPER PO) Take by mouth.     Cholecalciferol (VITAMIN D3) 1000 UNITS CAPS Take 2 capsules by mouth daily.      cycloSPORINE (RESTASIS) 0.05 % ophthalmic emulsion Place 1 drop into both eyes 2 (two) times daily.     escitalopram (LEXAPRO) 10 MG tablet TAKE (1) TABLET DAILY AS DIRECTED. 90 tablet 3   estradiol (ESTRACE) 0.1 MG/GM vaginal cream INSERT 1 VAGINALLY AT BEDTIME 127.5  g 1   fluticasone (FLONASE) 50 MCG/ACT nasal spray 1 SPRAY IN EACH NOSTRIL EVERY 12 HOURS 48 g 3   levothyroxine (SYNTHROID, LEVOTHROID) 25 MCG tablet Take 1 tablet (25 mcg total) by mouth daily before breakfast. 90 tablet 3   Multiple Vitamins-Minerals (WOMENS 50+ MULTI VITAMIN/MIN PO) Take by mouth.     Omega-3 Fatty Acids (SUPER OMEGA-3 PO) Take by mouth.     Polyethylene Glycol 3350 (MIRALAX PO) Take 1 packet by mouth daily as needed.       rosuvastatin (CRESTOR) 5 MG tablet TAKE (1) TABLET DAILY AS DIRECTED. 90 tablet 3   Specialty Vitamins Products (ONE-A-DAY BONE STRENGTH) 500-28-100 MG-MG-UNIT TABS Take 1 tablet by mouth 3 (three) times daily.     UNABLE TO FIND Med Name: Prelief Bladder     No current facility-administered medications for this visit.     OBJECTIVE: Middle-aged white woman who appears younger than stated age  Vitals:   03/21/19 1238  BP: (!) 164/71  Pulse: 71  Resp: 18  Temp: 98.7 F (37.1 C)  SpO2: 99%     Body mass index is 18.35 kg/m.   Wt Readings from Last 3 Encounters:  03/21/19 110 lb 4.8 oz (50 kg)  03/01/19 113 lb (51.3 kg)  12/01/18 114 lb (  51.7 kg)      ECOG FS:1 - Symptomatic but completely ambulatory  Ocular: Sclerae unicteric, pupils round and equal Ear-nose-throat: Wearing a mask Lymphatic: No cervical or supraclavicular adenopathy Lungs no rales or rhonchi Heart regular rate and rhythm Abd soft, nontender, positive bowel sounds MSK no focal spinal tenderness, no joint edema Neuro: non-focal, well-oriented, appropriate affect Breasts: The right breast is unremarkable.  The left breast is status post recent biopsy.  There is marked ecchymosis.  Both axillae are benign   LAB RESULTS:  CMP     Component Value Date/Time   NA 141 03/21/2019 1221   NA 143 11/01/2018 0848   K 4.5 03/21/2019 1221   CL 102 03/21/2019 1221   CO2 29 03/21/2019 1221   GLUCOSE 158 (H) 03/21/2019 1221   BUN 17 03/21/2019 1221   BUN 15  11/01/2018 0848   CREATININE 0.84 03/21/2019 1221   CREATININE 0.69 07/24/2013 0836   CALCIUM 9.4 03/21/2019 1221   PROT 6.7 03/21/2019 1221   PROT 6.6 11/01/2018 0848   ALBUMIN 4.1 03/21/2019 1221   ALBUMIN 4.5 11/01/2018 0848   AST 23 03/21/2019 1221   ALT 16 03/21/2019 1221   ALKPHOS 46 03/21/2019 1221   BILITOT 0.5 03/21/2019 1221   GFRNONAA >60 03/21/2019 1221   GFRNONAA 89 07/24/2013 0836   GFRAA >60 03/21/2019 1221   GFRAA >89 07/24/2013 0836    No results found for: TOTALPROTELP, ALBUMINELP, A1GS, A2GS, BETS, BETA2SER, GAMS, MSPIKE, SPEI  No results found for: KPAFRELGTCHN, LAMBDASER, KAPLAMBRATIO  Lab Results  Component Value Date   WBC 5.8 03/21/2019   NEUTROABS 4.0 03/21/2019   HGB 14.3 03/21/2019   HCT 44.9 03/21/2019   MCV 95.9 03/21/2019   PLT 172 03/21/2019    @LASTCHEMISTRY @  No results found for: LABCA2  No components found for: [id number]  No results for input(s): INR in the last 168 hours.  No results found for: LABCA2  No results found for: GMW102  No results found for: VOZ366  No results found for: YQI347  No results found for: CA2729  No components found for: HGQUANT  No results found for: CEA1 / No results found for: CEA1   No results found for: AFPTUMOR  No results found for: CHROMOGRNA  No results found for: PSA1  Appointment on 03/21/2019  Component Date Value Ref Range Status   Sodium 03/21/2019 141  135 - 145 mmol/L Final   Potassium 03/21/2019 4.5  3.5 - 5.1 mmol/L Final   Chloride 03/21/2019 102  98 - 111 mmol/L Final   CO2 03/21/2019 29  22 - 32 mmol/L Final   Glucose, Bld 03/21/2019 158* 70 - 99 mg/dL Final   BUN 42/59/5638 17  8 - 23 mg/dL Final   Creatinine 75/64/3329 0.84  0.44 - 1.00 mg/dL Final   Calcium 51/88/4166 9.4  8.9 - 10.3 mg/dL Final   Total Protein 11/14/1599 6.7  6.5 - 8.1 g/dL Final   Albumin 09/32/3557 4.1  3.5 - 5.0 g/dL Final   AST 32/20/2542 23  15 - 41 U/L Final   ALT 03/21/2019  16  0 - 44 U/L Final   Alkaline Phosphatase 03/21/2019 46  38 - 126 U/L Final   Total Bilirubin 03/21/2019 0.5  0.3 - 1.2 mg/dL Final   GFR, Est Non Af Am 03/21/2019 >60  >60 mL/min Final   GFR, Est AFR Am 03/21/2019 >60  >60 mL/min Final   Anion gap 03/21/2019 10  5 - 15 Final  Performed at [facility], [address], [place], [place] [address]   WBC Count 03/21/2019 5.8  4.0 - 10.5 K/uL Final   RBC 03/21/2019 4.68  3.87 - 5.11 MIL/uL Final   Hemoglobin 03/21/2019 14.3  12.0 - 15.0 g/dL Final   HCT 28/41/3244 44.9  36.0 - 46.0 % Final   MCV 03/21/2019 95.9  80.0 - 100.0 fL Final   MCH 03/21/2019 30.6  26.0 - 34.0 pg Final   MCHC 03/21/2019 31.8  30.0 - 36.0 g/dL Final   RDW 05/19/7251 13.7  11.5 - 15.5 % Final   Platelet Count 03/21/2019 172  150 - 400 K/uL Final   nRBC 03/21/2019 0.0  0.0 - 0.2 % Final   Neutrophils Relative % 03/21/2019 69  % Final   Neutro Abs 03/21/2019 4.0  1.7 - 7.7 K/uL Final   Lymphocytes Relative 03/21/2019 17  % Final   Lymphs Abs 03/21/2019 1.0  0.7 - 4.0 K/uL Final   Monocytes Relative 03/21/2019 11  % Final   Monocytes Absolute 03/21/2019 0.7  0.1 - 1.0 K/uL Final   Eosinophils Relative 03/21/2019 2  % Final   Eosinophils Absolute 03/21/2019 0.1  0.0 - 0.5 K/uL Final   Basophils Relative 03/21/2019 1  % Final   Basophils Absolute 03/21/2019 0.0  0.0 - 0.1 K/uL Final   Immature Granulocytes 03/21/2019 0  % Final   Abs Immature Granulocytes 03/21/2019 0.01  0.00 - 0.07 K/uL Final   Performed at [facility], [address] [place], [place] [address]    (this displays the last labs from the last 3 days)  No results found for: TOTALPROTELP, ALBUMINELP, A1GS, A2GS, BETS, BETA2SER, GAMS, MSPIKE, SPEI (this displays SPEP labs)  No results found for: KPAFRELGTCHN, LAMBDASER, KAPLAMBRATIO (kappa/lambda light chains)  No results found for: HGBA, HGBA2QUANT, HGBFQUANT,  HGBSQUAN (Hemoglobinopathy evaluation)   No results found for: LDH  No results found for: IRON, TIBC, IRONPCTSAT (Iron and TIBC)  Lab Results  Component Value Date   FERRITIN 23 09/19/2012    Urinalysis    Component Value Date/Time   APPEARANCEUR Clear 02/15/2018 1532   GLUCOSEU Negative 02/15/2018 1532   BILIRUBINUR Negative 02/15/2018 1532   PROTEINUR Negative 02/15/2018 1532   UROBILINOGEN negative 12/02/2014 1128   NITRITE Negative 02/15/2018 1532   LEUKOCYTESUR Negative 02/15/2018 1532     STUDIES: US Breast Ltd Uni Left Inc Axilla  Result Date: 03/09/2019 CLINICAL DATA:  Patient recalled from screening for left breast mass and calcifications. EXAM: DIGITAL DIAGNOSTIC LEFT MAMMOGRAM WITH CAD AND TOMO ULTRASOUND LEFT BREAST COMPARISON:  Previous exam(s). ACR Breast Density Category c: The breast tissue is heterogeneously dense, which may obscure small masses. FINDINGS: Within the lateral left breast posterior depth there is a persistent lobular mass, further evaluated with spot compression CC and MLO tomosynthesis images. Located within the mass are a few punctate calcifications. No additional concerning masses, calcifications or distortion within the left breast. Mammographic images were processed with CAD. Targeted ultrasound is performed, showing a 1.1 x 1.0 x 1.0 cm irregular hypoechoic mass left breast 3:30 o'clock 5 cm from the nipple. No left axillary adenopathy. IMPRESSION: Suspicious left breast mass 3:30 o'clock with associated punctate calcifications. RECOMMENDATION: Ultrasound-guided core needle biopsy left breast mass 3:30 o'clock. I have discussed the findings and recommendations with the patient. If applicable, a reminder letter will be sent to the patient regarding the next appointment. BI-RADS CATEGORY  5: Highly suggestive of malignancy. Electronically  Signed   By: Annia Belt M.D.   On: 03/09/2019 13:42   Mm Diag Breast Tomo Uni Left  Result Date:  03/09/2019 CLINICAL DATA:  Patient recalled from screening for left breast mass and calcifications. EXAM: DIGITAL DIAGNOSTIC LEFT MAMMOGRAM WITH CAD AND TOMO ULTRASOUND LEFT BREAST COMPARISON:  Previous exam(s). ACR Breast Density Category c: The breast tissue is heterogeneously dense, which may obscure small masses. FINDINGS: Within the lateral left breast posterior depth there is a persistent lobular mass, further evaluated with spot compression CC and MLO tomosynthesis images. Located within the mass are a few punctate calcifications. No additional concerning masses, calcifications or distortion within the left breast. Mammographic images were processed with CAD. Targeted ultrasound is performed, showing a 1.1 x 1.0 x 1.0 cm irregular hypoechoic mass left breast 3:30 o'clock 5 cm from the nipple. No left axillary adenopathy. IMPRESSION: Suspicious left breast mass 3:30 o'clock with associated punctate calcifications. RECOMMENDATION: Ultrasound-guided core needle biopsy left breast mass 3:30 o'clock. I have discussed the findings and recommendations with the patient. If applicable, a reminder letter will be sent to the patient regarding the next appointment. BI-RADS CATEGORY  5: Highly suggestive of malignancy. Electronically Signed   By: Annia Belt M.D.   On: 03/09/2019 13:42   Mm 3d Screen Breast Bilateral  Result Date: 03/05/2019 CLINICAL DATA:  Screening. EXAM: DIGITAL SCREENING BILATERAL MAMMOGRAM WITH TOMO AND CAD COMPARISON:  Previous exam(s). ACR Breast Density Category c: The breast tissue is heterogeneously dense, which may obscure small masses. FINDINGS: In the left breast, a possible mass with calcifications warrants further evaluation. In the right breast, no findings suspicious for malignancy. Images were processed with CAD. IMPRESSION: Further evaluation is suggested for possible mass with calcifications in the left breast. RECOMMENDATION: Diagnostic mammogram and possibly ultrasound of the  left breast. (Code:[id number]) The patient will be contacted regarding the findings, and additional imaging will be scheduled. BI-RADS CATEGORY  0: Incomplete. Need additional imaging evaluation and/or prior mammograms for comparison. Electronically Signed   By: Amie Portland M.D.   On: 03/05/2019 12:29   Mm Clip Placement Left  Result Date: 03/13/2019 CLINICAL DATA:  Patient presents for biopsy of a left breast mass. EXAM: DIAGNOSTIC LEFT MAMMOGRAM POST ULTRASOUND BIOPSY COMPARISON:  Previous exam(s). FINDINGS: Mammographic images were obtained following ultrasound guided biopsy of a left breast mass at 3:30 o'clock. The biopsy marking clip is in expected position at the site of biopsy. IMPRESSION: Appropriate positioning of the ribbon shaped biopsy marking clip at the site of biopsy in the the left breast at 3:30 o'clock. Final Assessment: Post Procedure Mammograms for Marker Placement Electronically Signed   By: Emmaline Kluver M.D.   On: 03/13/2019 15:25   [place] Lt Breast Bx W Loc Dev 1st Lesion Img Bx Spec US Guide  Addendum Date: 03/15/2019   ADDENDUM REPORT: 03/14/2019 17:19 ADDENDUM: Pathology revealed GRADE II INVASIVE DUCTAL CARCINOMA, of the LEFT breast, 3:30 o'clock. This was found to be concordant by Dr. Emmaline Kluver. Pathology results were discussed with the patient and husband by telephone. The patient reported doing well after the biopsy with tenderness at the site. Post biopsy instructions and care were reviewed and questions were answered. The patient was encouraged to call [facility] for any additional concerns. The patient was referred to [facility] on March 21, 2019. Pathology results reported by Collene Mares, RN on 03/14/2019. Electronically Signed   By:  Emmaline Kluver M.D.   On: 03/14/2019 17:19   Result Date: 03/15/2019 CLINICAL DATA:  Patient presents for biopsy of a  left breast mass. EXAM: ULTRASOUND GUIDED LEFT BREAST CORE NEEDLE BIOPSY COMPARISON:  Previous exam(s). FINDINGS: I met with the patient and we discussed the procedure of ultrasound-guided biopsy, including benefits and alternatives. We discussed the high likelihood of a successful procedure. We discussed the risks of the procedure, including infection, bleeding, tissue injury, clip migration, and inadequate sampling. Informed written consent was given. The usual time-out protocol was performed immediately prior to the procedure. Lesion quadrant: Lower outer quadrant Using sterile technique and 1% Lidocaine as local anesthetic, under direct ultrasound visualization, a 12 gauge spring-loaded device was used to perform biopsy of a left breast mass at 3:30 o'clock using a lateral approach. At the conclusion of the procedure ribbon tissue marker clip was deployed into the biopsy cavity. Follow up 2 view mammogram was performed and dictated separately. IMPRESSION: Ultrasound guided biopsy of a left breast mass at 3:30 o'clock. No apparent complications. Electronically Signed: By: Emmaline Kluver M.D. On: 03/13/2019 15:26    ELIGIBLE FOR AVAILABLE RESEARCH PROTOCOL: no  ASSESSMENT: 73 y.o. [place], [place] woman status post left breast upper inner quadrant biopsy 03/13/2019 for a clinical T1c N0, stage IA invasive ductal carcinoma, grade 2, estrogen and progesterone receptor positive, HER-2 not amplified, with an MIB-1 of 30%  (1) definitive surgery pending  (2) Oncotype to be obtained from the final surgical sample  (3) adjuvant radiation as appropriate  (4) antiestrogens to follow at the completion of local treatment  PLAN: I spent approximately 60 minutes face to face with Laurelle with more than 50% of that time spent in counseling and coordination of care. Specifically we reviewed the biology of the patient's diagnosis and the specifics of her situation.  We first reviewed the fact that cancer is not one  disease but more than 100 different diseases and that it is important to keep them separate-- otherwise when friends and relatives discuss their own cancer experiences with Myiesha confusion can result. Similarly we explained that if breast cancer spreads to the bone or liver, the patient would not have bone cancer or liver cancer, but breast cancer in the bone and breast cancer in the liver: one cancer in three places-- not 3 different cancers which otherwise would have to be treated in 3 different ways.  We discussed the difference between local and systemic therapy. In terms of loco-regional treatment, lumpectomy plus radiation is equivalent to mastectomy as far as survival is concerned. For this reason, and because the cosmetic results are generally superior, we recommend breast conserving surgery.   We then discussed the rationale for systemic therapy. There is some risk that this cancer may have already spread to other parts of her body. Patients frequently ask at this point about bone scans, CAT scans and PET scans to find out if they have occult breast cancer somewhere else. The problem is that in early stage disease we are much more likely to find false positives then true cancers and this would expose the patient to unnecessary procedures as well as unnecessary radiation. Scans cannot answer the question the patient really would like to know, which is whether she has microscopic disease elsewhere in her body. For those reasons we do not recommend them.  Of course we would proceed to aggressive evaluation of any symptoms that might suggest metastatic disease, but that is not the case here.  Next we  went over the options for systemic therapy which are anti-estrogens, anti-HER-2 immunotherapy, and chemotherapy. Vedhika does not meet criteria for anti-HER-2 immunotherapy. She is a good candidate for anti-estrogens.  The question of chemotherapy is more complicated. Chemotherapy is most effective in  rapidly growing, aggressive tumors. It is much less effective in low-grade, slow growing cancers, like Madora 's. For that reason we are going to request an Oncotype from the definitive surgical sample, as suggested by NCCN guidelines. That will help [place] make a definitive decision regarding chemotherapy in this case.  Because I think there is a 50% possibility that she may need chemotherapy we discussed this in a preliminary fashion and specifically considered CMF x8 versus Cytoxan/Taxotere.  I think she would be a good candidate for the latter and we discussed the DigniCap program if she opts for the shorter more intense protocol.    The overall plan then is for definitive surgery, Oncotype testing, chemo therapy if appropriate, adjuvant radiation and antiestrogens.  Lakicia has a good understanding of the overall plan. She agrees with it. She knows the goal of treatment in her case is cure. She will call with any problems that may develop before her next visit here.   Lowella Dell, MD   03/21/2019 5:23 PM Medical Oncology and Hematology [facility] [address] [place], [place] [address] Tel. [phone]    Fax. [phone]   This document serves as a record of services personally performed by Ruthann Cancer, MD. It was created on his behalf by Mickie Bail, a trained medical scribe. The creation of this record is based on the scribe's personal observations and the provider's statements to them.   I, Ruthann Cancer MD, have reviewed the above documentation for accuracy and completeness, and I agree with the above.

## 2019-03-21 ENCOUNTER — Encounter: Payer: Self-pay | Admitting: Physical Therapy

## 2019-03-21 ENCOUNTER — Other Ambulatory Visit: Payer: Self-pay

## 2019-03-21 ENCOUNTER — Ambulatory Visit: Payer: Self-pay | Admitting: Surgery

## 2019-03-21 ENCOUNTER — Ambulatory Visit: Payer: Medicare Other | Attending: Surgery | Admitting: Physical Therapy

## 2019-03-21 ENCOUNTER — Ambulatory Visit
Admission: RE | Admit: 2019-03-21 | Discharge: 2019-03-21 | Disposition: A | Discharge status: Home / Self Care | Payer: Medicare Other | Source: Ambulatory Visit | Attending: Radiation Oncology | Admitting: Radiation Oncology

## 2019-03-21 ENCOUNTER — Inpatient Hospital Stay: Payer: Medicare Other | Attending: Oncology | Admitting: Oncology

## 2019-03-21 ENCOUNTER — Encounter: Payer: Self-pay | Admitting: Oncology

## 2019-03-21 ENCOUNTER — Inpatient Hospital Stay: Payer: Medicare Other

## 2019-03-21 VITALS — BP 164/71 | HR 71 | Temp 98.7°F | Resp 18 | Ht 65.0 in | Wt 110.3 lb

## 2019-03-21 DIAGNOSIS — G5692 Unspecified mononeuropathy of left upper limb: Secondary | ICD-10-CM

## 2019-03-21 DIAGNOSIS — C50212 Malignant neoplasm of upper-inner quadrant of left female breast: Secondary | ICD-10-CM

## 2019-03-21 DIAGNOSIS — E559 Vitamin D deficiency, unspecified: Secondary | ICD-10-CM

## 2019-03-21 DIAGNOSIS — Z170 Estrogen receptor positive status [ER+]: Secondary | ICD-10-CM | POA: Insufficient documentation

## 2019-03-21 DIAGNOSIS — C50912 Malignant neoplasm of unspecified site of left female breast: Secondary | ICD-10-CM

## 2019-03-21 DIAGNOSIS — C44311 Basal cell carcinoma of skin of nose: Secondary | ICD-10-CM | POA: Diagnosis not present

## 2019-03-21 DIAGNOSIS — C50512 Malignant neoplasm of lower-outer quadrant of left female breast: Secondary | ICD-10-CM | POA: Insufficient documentation

## 2019-03-21 DIAGNOSIS — R293 Abnormal posture: Secondary | ICD-10-CM | POA: Diagnosis not present

## 2019-03-21 DIAGNOSIS — M8580 Other specified disorders of bone density and structure, unspecified site: Secondary | ICD-10-CM | POA: Diagnosis not present

## 2019-03-21 LAB — CMP (CANCER CENTER ONLY)
ALT: 16 U/L (ref 0–44)
AST: 23 U/L (ref 15–41)
Albumin: 4.1 g/dL (ref 3.5–5.0)
Alkaline Phosphatase: 46 U/L (ref 38–126)
Anion gap: 10 (ref 5–15)
BUN: 17 mg/dL (ref 8–23)
CO2: 29 mmol/L (ref 22–32)
Calcium: 9.4 mg/dL (ref 8.9–10.3)
Chloride: 102 mmol/L (ref 98–111)
Creatinine: 0.84 mg/dL (ref 0.44–1.00)
GFR, Est AFR Am: >60 mL/min (ref >60)
GFR, Estimated: >60 mL/min (ref >60)
Glucose, Bld: 158 mg/dL — ABNORMAL HIGH (ref 70–99)
Potassium: 4.5 mmol/L (ref 3.5–5.1)
Sodium: 141 mmol/L (ref 135–145)
Total Bilirubin: 0.5 mg/dL (ref 0.3–1.2)
Total Protein: 6.7 g/dL (ref 6.5–8.1)

## 2019-03-21 LAB — CBC WITH DIFFERENTIAL (CANCER CENTER ONLY)
Abs Immature Granulocytes: 0.01 10*3/uL (ref 0.00–0.07)
Basophils Absolute: 0 10*3/uL (ref 0.0–0.1)
Basophils Relative: 1 %
Eosinophils Absolute: 0.1 10*3/uL (ref 0.0–0.5)
Eosinophils Relative: 2 %
HCT: 44.9 % (ref 36.0–46.0)
Hemoglobin: 14.3 g/dL (ref 12.0–15.0)
Immature Granulocytes: 0 %
Lymphocytes Relative: 17 %
Lymphs Abs: 1 10*3/uL (ref 0.7–4.0)
MCH: 30.6 pg (ref 26.0–34.0)
MCHC: 31.8 g/dL (ref 30.0–36.0)
MCV: 95.9 fL (ref 80.0–100.0)
Monocytes Absolute: 0.7 10*3/uL (ref 0.1–1.0)
Monocytes Relative: 11 %
Neutro Abs: 4 10*3/uL (ref 1.7–7.7)
Neutrophils Relative %: 69 %
Platelet Count: 172 10*3/uL (ref 150–400)
RBC: 4.68 MIL/uL (ref 3.87–5.11)
RDW: 13.7 % (ref 11.5–15.5)
WBC Count: 5.8 10*3/uL (ref 4.0–10.5)
nRBC: 0 % (ref 0.0–0.2)

## 2019-03-21 NOTE — Progress Notes (Signed)
Radiation Oncology         [phone] ________________________________  Name: Kayla Randolph        MRN: [id number]  Date of Service: 03/21/2019 DOB: May 22, 1945  [id number], Kayla Distance, DO  Randolph, Marcello Moores, MD     REFERRING PHYSICIAN: Erroll Luna, MD   DIAGNOSIS: The encounter diagnosis was Malignant neoplasm of upper-inner quadrant of left breast in female, estrogen receptor positive ([place]).   HISTORY OF PRESENT ILLNESS: Kayla Randolph is a 73 y.o. female seen in the multidisciplinary breast clinic for a new diagnosis of left breast cancer. The patient was noted to have screening detected calcifications and a mass. Diagnostic imaging revealed a 1.1 x 1 x 1 cm mass at 3:30 and associated calcifications. Her axilla was negative for adenopathy a biopsy on 03/13/2019 revealed a grade 2 invasive ductal carcinoma that was ER/PR positive, HER2 negative and her Ki 67 was 30%. She comes today to discuss options of treatment for her cancer.    PREVIOUS RADIATION THERAPY: No   PAST MEDICAL HISTORY:  Past Medical History:  Diagnosis Date   Allergy    History of colon polyps    Hyperlipidemia    Interstitial cystitis    History of   Osteopenia    Vitamin D deficiency        PAST SURGICAL HISTORY: Past Surgical History:  Procedure Laterality Date   CESAREAN SECTION     TONSILLECTOMY       FAMILY HISTORY:  Family History  Problem Relation Age of Onset   Heart attack Father    Congestive Heart Failure Father    Osteoporosis Maternal Aunt    Cancer Mother        possible lung - spread    Early death Mother    Stroke Maternal Grandmother    Stroke Paternal Grandfather      SOCIAL HISTORY:  reports that she has never smoked. She has never used smokeless tobacco. She reports current alcohol use of about 7.0 standard drinks of alcohol per week. She reports that she does not use drugs. The patient is married and lives in [place]. She is accompanied by her  husband.   ALLERGIES: Macrodantin, Sulfa antibiotics, and Wellbutrin [bupropion hcl]   MEDICATIONS:  Current Outpatient Medications  Medication Sig Dispense Refill   Black Pepper-Turmeric (TURMERIC COMPLEX/BLACK PEPPER PO) Take by mouth.     Cholecalciferol (VITAMIN D3) 1000 UNITS CAPS Take 2 capsules by mouth daily.      cycloSPORINE (RESTASIS) 0.05 % ophthalmic emulsion Place 1 drop into both eyes 2 (two) times daily.     escitalopram (LEXAPRO) 10 MG tablet TAKE (1) TABLET DAILY AS DIRECTED. 90 tablet 3   estradiol (ESTRACE) 0.1 MG/GM vaginal cream INSERT 1 VAGINALLY AT BEDTIME 127.5 g 1   fluticasone (FLONASE) 50 MCG/ACT nasal spray 1 SPRAY IN EACH NOSTRIL EVERY 12 HOURS 48 g 3   levothyroxine (SYNTHROID, LEVOTHROID) 25 MCG tablet Take 1 tablet (25 mcg total) by mouth daily before breakfast. 90 tablet 3   Multiple Vitamins-Minerals (WOMENS 50+ MULTI VITAMIN/MIN PO) Take by mouth.     Omega-3 Fatty Acids (SUPER OMEGA-3 PO) Take by mouth.     Polyethylene Glycol 3350 (MIRALAX PO) Take 1 packet by mouth daily as needed.       rosuvastatin (CRESTOR) 5 MG tablet TAKE (1) TABLET DAILY AS DIRECTED. 90 tablet 3   Specialty Vitamins Products (ONE-A-DAY BONE STRENGTH) 500-28-100 MG-MG-UNIT TABS Take 1 tablet by mouth 3 (three) times daily.  UNABLE TO FIND Med Name: Prelief Bladder     No current facility-administered medications for this visit.      REVIEW OF SYSTEMS: On review of systems, the patient reports that she is doing well overall. She denies any chest pain, shortness of breath, cough, fevers, chills, night sweats, unintended weight changes. She denies any bowel or bladder disturbances, and denies abdominal pain, nausea or vomiting. She denies any new musculoskeletal or joint aches or pains. A complete review of systems is obtained and is otherwise negative.     PHYSICAL EXAM:  Wt Readings from Last 3 Encounters:  03/01/19 113 lb (51.3 kg)  12/01/18 114 lb (51.7  kg)  06/21/18 114 lb (51.7 kg)   Temp Readings from Last 3 Encounters:  03/01/19 98.6 F (37 C) (Temporal)  06/21/18 97.6 F (36.4 C) (Oral)  02/15/18 98.2 F (36.8 C) (Oral)   BP Readings from Last 3 Encounters:  03/01/19 108/60  12/01/18 112/63  06/21/18 112/63   Pulse Readings from Last 3 Encounters:  03/01/19 65  12/01/18 70  06/21/18 71   In general this is a well appearing caucasian female in no acute distress. She's alert and oriented x4 and appropriate throughout the examination. Cardiopulmonary assessment is negative for acute distress and she exhibits normal effort. Bilateral breast exam is deferred.   ECOG = 0  0 - Asymptomatic (Fully active, able to carry on all predisease activities without restriction)  1 - Symptomatic but completely ambulatory (Restricted in physically strenuous activity but ambulatory and able to carry out work of a light or sedentary nature. For example, light housework, office work)  2 - Symptomatic, <50% in bed during the day (Ambulatory and capable of all self care but unable to carry out any work activities. Up and about more than 50% of waking hours)  3 - Symptomatic, >50% in bed, but not bedbound (Capable of only limited self-care, confined to bed or chair 50% or more of waking hours)  4 - Bedbound (Completely disabled. Cannot carry on any self-care. Totally confined to bed or chair)  5 - Death   Eustace Pen MM, Creech RH, Tormey DC, et al. [phone]). "Toxicity and response criteria of the [facility]". [place] Oncol. 5 (6): 649-55    LABORATORY DATA:  Lab Results  Component Value Date   WBC 5.3 11/01/2018   HGB 15.3 11/01/2018   HCT 47.3 (H) 11/01/2018   MCV 91 11/01/2018   PLT 182 11/01/2018   Lab Results  Component Value Date   NA 143 11/01/2018   K 4.0 11/01/2018   CL 102 11/01/2018   CO2 29 11/01/2018   Lab Results  Component Value Date   ALT 15 11/01/2018   AST 21 11/01/2018   ALKPHOS 53  11/01/2018   BILITOT 0.5 11/01/2018      RADIOGRAPHY: US Breast Ltd Uni Left Inc Axilla  Result Date: 03/09/2019 CLINICAL DATA:  Patient recalled from screening for left breast mass and calcifications. EXAM: DIGITAL DIAGNOSTIC LEFT MAMMOGRAM WITH CAD AND TOMO ULTRASOUND LEFT BREAST COMPARISON:  Previous exam(s). ACR Breast Density Category c: The breast tissue is heterogeneously dense, which may obscure small masses. FINDINGS: Within the lateral left breast posterior depth there is a persistent lobular mass, further evaluated with spot compression CC and MLO tomosynthesis images. Located within the mass are a few punctate calcifications. No additional concerning masses, calcifications or distortion within the left breast. Mammographic images were processed with CAD. Targeted ultrasound is performed, showing  a 1.1 x 1.0 x 1.0 cm irregular hypoechoic mass left breast 3:30 o'clock 5 cm from the nipple. No left axillary adenopathy. IMPRESSION: Suspicious left breast mass 3:30 o'clock with associated punctate calcifications. RECOMMENDATION: Ultrasound-guided core needle biopsy left breast mass 3:30 o'clock. I have discussed the findings and recommendations with the patient. If applicable, a reminder letter will be sent to the patient regarding the next appointment. BI-RADS CATEGORY  5: Highly suggestive of malignancy. Electronically Signed   By: Lovey Newcomer M.D.   On: 03/09/2019 13:42   Mm Diag Breast Tomo Uni Left  Result Date: 03/09/2019 CLINICAL DATA:  Patient recalled from screening for left breast mass and calcifications. EXAM: DIGITAL DIAGNOSTIC LEFT MAMMOGRAM WITH CAD AND TOMO ULTRASOUND LEFT BREAST COMPARISON:  Previous exam(s). ACR Breast Density Category c: The breast tissue is heterogeneously dense, which may obscure small masses. FINDINGS: Within the lateral left breast posterior depth there is a persistent lobular mass, further evaluated with spot compression CC and MLO tomosynthesis images.  Located within the mass are a few punctate calcifications. No additional concerning masses, calcifications or distortion within the left breast. Mammographic images were processed with CAD. Targeted ultrasound is performed, showing a 1.1 x 1.0 x 1.0 cm irregular hypoechoic mass left breast 3:30 o'clock 5 cm from the nipple. No left axillary adenopathy. IMPRESSION: Suspicious left breast mass 3:30 o'clock with associated punctate calcifications. RECOMMENDATION: Ultrasound-guided core needle biopsy left breast mass 3:30 o'clock. I have discussed the findings and recommendations with the patient. If applicable, a reminder letter will be sent to the patient regarding the next appointment. BI-RADS CATEGORY  5: Highly suggestive of malignancy. Electronically Signed   By: Lovey Newcomer M.D.   On: 03/09/2019 13:42   Mm 3d Screen Breast Bilateral  Result Date: 03/05/2019 CLINICAL DATA:  Screening. EXAM: DIGITAL SCREENING BILATERAL MAMMOGRAM WITH TOMO AND CAD COMPARISON:  Previous exam(s). ACR Breast Density Category c: The breast tissue is heterogeneously dense, which may obscure small masses. FINDINGS: In the left breast, a possible mass with calcifications warrants further evaluation. In the right breast, no findings suspicious for malignancy. Images were processed with CAD. IMPRESSION: Further evaluation is suggested for possible mass with calcifications in the left breast. RECOMMENDATION: Diagnostic mammogram and possibly ultrasound of the left breast. (Code:[id number]) The patient will be contacted regarding the findings, and additional imaging will be scheduled. BI-RADS CATEGORY  0: Incomplete. Need additional imaging evaluation and/or prior mammograms for comparison. Electronically Signed   By: Lajean Manes M.D.   On: 03/05/2019 12:29   Mm Clip Placement Left  Result Date: 03/13/2019 CLINICAL DATA:  Patient presents for biopsy of a left breast mass. EXAM: DIAGNOSTIC LEFT MAMMOGRAM POST ULTRASOUND BIOPSY  COMPARISON:  Previous exam(s). FINDINGS: Mammographic images were obtained following ultrasound guided biopsy of a left breast mass at 3:30 o'clock. The biopsy marking clip is in expected position at the site of biopsy. IMPRESSION: Appropriate positioning of the ribbon shaped biopsy marking clip at the site of biopsy in the the left breast at 3:30 o'clock. Final Assessment: Post Procedure Mammograms for Marker Placement Electronically Signed   By: Audie Pinto M.D.   On: 03/13/2019 15:25   [place] Lt Breast Bx W Loc Dev 1st Lesion Img Bx Spec US Guide  Addendum Date: 03/15/2019   ADDENDUM REPORT: 03/14/2019 17:19 ADDENDUM: Pathology revealed GRADE II INVASIVE DUCTAL CARCINOMA, of the LEFT breast, 3:30 o'clock. This was found to be concordant by Dr. Audie Pinto. Pathology results were discussed with the patient and  husband by telephone. The patient reported doing well after the biopsy with tenderness at the site. Post biopsy instructions and care were reviewed and questions were answered. The patient was encouraged to call [facility] for any additional concerns. The patient was referred to [facility] on March 21, 2019. Pathology results reported by Stacie Acres, RN on 03/14/2019. Electronically Signed   By: Audie Pinto M.D.   On: 03/14/2019 17:19   Result Date: 03/15/2019 CLINICAL DATA:  Patient presents for biopsy of a left breast mass. EXAM: ULTRASOUND GUIDED LEFT BREAST CORE NEEDLE BIOPSY COMPARISON:  Previous exam(s). FINDINGS: I met with the patient and we discussed the procedure of ultrasound-guided biopsy, including benefits and alternatives. We discussed the high likelihood of a successful procedure. We discussed the risks of the procedure, including infection, bleeding, tissue injury, clip migration, and inadequate sampling. Informed written consent was given. The usual time-out  protocol was performed immediately prior to the procedure. Lesion quadrant: Lower outer quadrant Using sterile technique and 1% Lidocaine as local anesthetic, under direct ultrasound visualization, a 12 gauge spring-loaded device was used to perform biopsy of a left breast mass at 3:30 o'clock using a lateral approach. At the conclusion of the procedure ribbon tissue marker clip was deployed into the biopsy cavity. Follow up 2 view mammogram was performed and dictated separately. IMPRESSION: Ultrasound guided biopsy of a left breast mass at 3:30 o'clock. No apparent complications. Electronically Signed: By: Audie Pinto M.D. On: 03/13/2019 15:26       IMPRESSION/PLAN: 1. 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 Dr. Lisbeth Renshaw discusses the pathology findings and reviews the nature of left breast disease. The consensus from the breast conference includes breast conservation with lumpectomy with sentinel node biopsy. Depending on the size of the final tumor measurements rendered by pathology, the tumor may be tested for Oncotype Dx score to determine a role for systemic therapy. Provided that chemotherapy is not indicated, the patient's course would then be followed by external radiotherapy to the breast followed by antiestrogen therapy. She is interested in being aggressive about her treatment. We discussed the risks, benefits, short, and long term effects of radiotherapy, and the patient is interested in proceeding. Dr. Lisbeth Renshaw discusses the delivery and logistics of radiotherapy and anticipates a course of 4 or 6 1/2 weeks of radiotherapy to the left breast with deep inspiration breath hold technique. We will see her back about 2 weeks after surgery to discuss the simulation process and anticipate we starting radiotherapy about 4-6 weeks after surgery.   In a visit lasting 35 minutes, greater than 50% of the time was spent face to face discussing her case, and  coordinating the patient's care.  The above documentation reflects my direct findings during this shared patient visit. Please see the separate note by Dr. Lisbeth Renshaw on this date for the remainder of the patient's plan of care.    Carola Rhine, PAC

## 2019-03-21 NOTE — H&P (Signed)
Kayla Randolph Documented: 03/21/2019 7:26 AM Location: [facility] Patient #: [id number] DOB: 24-Dec-1945 Undefined / Language: [place] / Race: White Female  History of Present Illness Kayla Fus A. Kayla Choung Randolph; 03/21/2019 3:30 PM) Patient words: Pt sent at the request of Dr Mitzi Hansen for mammographic left breast mass 1 cm UOQ core bx IDC ER POS PR POS Her 2 neu negative. No other complaints. No mass, discharge or pain.  The patient is a 73 year old female.   Past Surgical History Kayla Circle, RN; 03/21/2019 7:26 AM) Breast Biopsy Left. Cataract Surgery Right. Cesarean Section - 1 Oral Surgery Tonsillectomy  Diagnostic Studies History Kayla Circle, RN; 03/21/2019 7:26 AM) Colonoscopy 5-10 years ago Mammogram within last year Pap Smear 1-5 years ago  Medication History Kayla Circle, RN; 03/21/2019 7:26 AM) Medications Reconciled  Social History Kayla Circle, RN; 03/21/2019 7:26 AM) Alcohol use Moderate alcohol use. Caffeine use Coffee. No drug use Tobacco use Never smoker.  Family History Kayla Circle, RN; 03/21/2019 7:26 AM) Arthritis Family Members In General, Mother. Breast Cancer Family Members In General. Cerebrovascular Accident Family Members In General. Depression Father. Ischemic Bowel Disease Father.  Pregnancy / Birth History Kayla Circle, RN; 03/21/2019 7:26 AM) Age at menarche 11 years. Age of menopause 51-55 Contraceptive History Oral contraceptives. Gravida 2 Irregular periods Length (months) of breastfeeding 7-12 Maternal age 47-30 Para 2  Other Problems Kayla Circle, RN; 03/21/2019 7:26 AM) Anxiety Disorder Arthritis Hypercholesterolemia Thyroid Disease     Review of Systems Kayla Elm Ledford RN; 03/21/2019 7:26 AM) General Not Present- Appetite Loss, Chills, Fatigue, Fever, Night Sweats, Weight Gain and Weight Loss. Skin Present- New Lesions. Not Present- Change in Wart/Mole, Dryness, Hives,  Jaundice, Non-Healing Wounds, Rash and Ulcer. HEENT Present- Seasonal Allergies and Wears glasses/contact lenses. Not Present- Earache, Hearing Loss, Hoarseness, Nose Bleed, Oral Ulcers, Ringing in the Ears, Sinus Pain, Sore Throat, Visual Disturbances and Yellow Eyes. Respiratory Not Present- Bloody sputum, Chronic Cough, Difficulty Breathing, Snoring and Wheezing. Breast Not Present- Breast Mass, Breast Pain, Nipple Discharge and Skin Changes. Cardiovascular Present- Leg Cramps. Not Present- Chest Pain, Difficulty Breathing Lying Down, Palpitations, Rapid Heart Rate, Shortness of Breath and Swelling of Extremities. Gastrointestinal Present- Bloating, Constipation and Gets full quickly at meals. Not Present- Abdominal Pain, Bloody Stool, Change in Bowel Habits, Chronic diarrhea, Difficulty Swallowing, Excessive gas, Hemorrhoids, Indigestion, Nausea, Rectal Pain and Vomiting. Female Genitourinary Not Present- Frequency, Nocturia, Painful Urination, Pelvic Pain and Urgency. Musculoskeletal Not Present- Back Pain, Joint Pain, Joint Stiffness, Muscle Pain, Muscle Weakness and Swelling of Extremities. Neurological Present- Headaches. Not Present- Decreased Memory, Fainting, Numbness, Seizures, Tingling, Tremor, Trouble walking and Weakness. Psychiatric Present- Change in Sleep Pattern. Not Present- Anxiety, Bipolar, Depression, Fearful and Frequent crying. Hematology Not Present- Blood Thinners, Easy Bruising, Excessive bleeding, Gland problems, HIV and Persistent Infections.   Physical Exam (Kayla Randolph; 03/21/2019 3:31 PM)  General Mental Status-Alert. General Appearance-Consistent with stated age. Hydration-Well hydrated. Voice-Normal.  Chest and Lung Exam Note: wob NORMAL NO WHEEZING  Breast Note: bruising left breast UOQ no mass right breast normal  Cardiovascular Note: NSR  Neurologic Neurologic evaluation reveals -alert and oriented x 3 with no impairment of  recent or remote memory. Mental Status-Normal.  Musculoskeletal Normal Exam - Left-Upper Extremity Strength Normal and Lower Extremity Strength Normal. Normal Exam - Right-Upper Extremity Strength Normal and Lower Extremity Strength Normal.  Lymphatic Head & Neck  General Head & Neck Lymphatics: Bilateral - Description - Normal. Axillary  General Axillary Region: Bilateral -  Description - Normal. Tenderness - Non Tender.    Assessment & Plan (Kayla Randolph; 03/21/2019 3:33 PM)  BREAST CANCER, LEFT (C50.912) Impression: pt has opted for left breast seed lumpectomy with left SLN mapping  Risk of lumpectomy include bleeding, infection, seroma, more surgery, use of seed/wire, wound care, cosmetic deformity and the need for other treatments, death , blood clots, death. Pt agrees to proceed.  Risk of sentinel lymph node mapping include bleeding, infection, lymphedema, shoulder pain. stiffness, dye allergy. cosmetic deformity , blood clots, death, need for more surgery. Pt agrees to proceed.  Current Plans You are being scheduled for surgery- Our schedulers will call you.  You should hear from our office's scheduling department within 5 working days about the location, date, and time of surgery. We try to make accommodations for patient's preferences in scheduling surgery, but sometimes the OR schedule or the surgeon's schedule prevents [place] from making those accommodations.  If you have not heard from our office [phone]) in 5 working days, call the office and ask for your surgeon's nurse.  If you have other questions about your diagnosis, plan, or surgery, call the office and ask for your surgeon's nurse.  We discussed the staging and pathophysiology of breast cancer. We discussed all of the different options for treatment for breast cancer including surgery, chemotherapy, radiation therapy, Herceptin, and antiestrogen therapy. We discussed a sentinel lymph node  biopsy as she does not appear to having lymph node involvement right now. We discussed the performance of that with injection of radioactive tracer and blue dye. We discussed that she would have an incision underneath her axillary hairline. We discussed that there is a bout a 10-20% chance of having a positive node with a sentinel lymph node biopsy and we will await the permanent pathology to make any other first further decisions in terms of her treatment. One of these options might be to return to the operating room to perform an axillary lymph node dissection. We discussed about a 1-2% risk lifetime of chronic shoulder pain as well as lymphedema associated with a sentinel lymph node biopsy. We discussed the options for treatment of the breast cancer which included lumpectomy versus a mastectomy. We discussed the performance of the lumpectomy with a wire placement. We discussed a 10-20% chance of a positive margin requiring reexcision in the operating room. We also discussed that she may need radiation therapy or antiestrogen therapy or both if she undergoes lumpectomy. We discussed the mastectomy and the postoperative care for that as well. We discussed that there is no difference in her survival whether she undergoes lumpectomy with radiation therapy or antiestrogen therapy versus a mastectomy. There is a slight difference in the local recurrence rate being 3-5% with lumpectomy and about 1% with a mastectomy. We discussed the risks of operation including bleeding, infection, possible reoperation. She understands her further therapy will be based on what her stages at the time of her operation.  Pt Education - [facility] breast cancer surgery: discussed with patient and provided information. Pt Education - [facility] Breast Biopsy HCI: discussed with patient and provided information. Pt Education - ABC (After Breast Cancer) Class Info: discussed with patient and provided information. Pt Education - [facility] Breast Pains  Education

## 2019-03-21 NOTE — H&P (View-Only) (Signed)
OREANA KOESTER Documented: 03/21/2019 7:26 AM Location: [facility] Patient #: [id number] DOB: 24-Dec-1945 Undefined / Language: [place] / Race: White Female  History of Present Illness [name] A. [name] MD; 03/21/2019 3:30 PM) Patient words: Pt sent at the request of Dr Mitzi Hansen for mammographic left breast mass 1 cm UOQ core bx IDC ER POS PR POS Her 2 neu negative. No other complaints. No mass, discharge or pain.  The patient is a 73 year old female.   Past Surgical History [name], RN; 03/21/2019 7:26 AM) Breast Biopsy Left. Cataract Surgery Right. Cesarean Section - 1 Oral Surgery Tonsillectomy  Diagnostic Studies History [name], RN; 03/21/2019 7:26 AM) Colonoscopy 5-10 years ago Mammogram within last year Pap Smear 1-5 years ago  Medication History [name], RN; 03/21/2019 7:26 AM) Medications Reconciled  Social History [name], RN; 03/21/2019 7:26 AM) Alcohol use Moderate alcohol use. Caffeine use Coffee. No drug use Tobacco use Never smoker.  Family History [name], RN; 03/21/2019 7:26 AM) Arthritis Family Members In General, Mother. Breast Cancer Family Members In General. Cerebrovascular Accident Family Members In General. Depression Father. Ischemic Bowel Disease Father.  Pregnancy / Birth History [name], RN; 03/21/2019 7:26 AM) Age at menarche 11 years. Age of menopause 51-55 Contraceptive History Oral contraceptives. Gravida 2 Irregular periods Length (months) of breastfeeding 7-12 Maternal age 47-30 Para 2  Other Problems [name], RN; 03/21/2019 7:26 AM) Anxiety Disorder Arthritis Hypercholesterolemia Thyroid Disease     Review of Systems [name] RN; 03/21/2019 7:26 AM) General Not Present- Appetite Loss, Chills, Fatigue, Fever, Night Sweats, Weight Gain and Weight Loss. Skin Present- New Lesions. Not Present- Change in Wart/Mole, Dryness, Hives,  Jaundice, Non-Healing Wounds, Rash and Ulcer. HEENT Present- Seasonal Allergies and Wears glasses/contact lenses. Not Present- Earache, Hearing Loss, Hoarseness, Nose Bleed, Oral Ulcers, Ringing in the Ears, Sinus Pain, Sore Throat, Visual Disturbances and Yellow Eyes. Respiratory Not Present- Bloody sputum, Chronic Cough, Difficulty Breathing, Snoring and Wheezing. Breast Not Present- Breast Mass, Breast Pain, Nipple Discharge and Skin Changes. Cardiovascular Present- Leg Cramps. Not Present- Chest Pain, Difficulty Breathing Lying Down, Palpitations, Rapid Heart Rate, Shortness of Breath and Swelling of Extremities. Gastrointestinal Present- Bloating, Constipation and Gets full quickly at meals. Not Present- Abdominal Pain, Bloody Stool, Change in Bowel Habits, Chronic diarrhea, Difficulty Swallowing, Excessive gas, Hemorrhoids, Indigestion, Nausea, Rectal Pain and Vomiting. Female Genitourinary Not Present- Frequency, Nocturia, Painful Urination, Pelvic Pain and Urgency. Musculoskeletal Not Present- Back Pain, Joint Pain, Joint Stiffness, Muscle Pain, Muscle Weakness and Swelling of Extremities. Neurological Present- Headaches. Not Present- Decreased Memory, Fainting, Numbness, Seizures, Tingling, Tremor, Trouble walking and Weakness. Psychiatric Present- Change in Sleep Pattern. Not Present- Anxiety, Bipolar, Depression, Fearful and Frequent crying. Hematology Not Present- Blood Thinners, Easy Bruising, Excessive bleeding, Gland problems, HIV and Persistent Infections.   Physical Exam ([name] MD; 03/21/2019 3:31 PM)  General Mental Status-Alert. General Appearance-Consistent with stated age. Hydration-Well hydrated. Voice-Normal.  Chest and Lung Exam Note: wob NORMAL NO WHEEZING  Breast Note: bruising left breast UOQ no mass right breast normal  Cardiovascular Note: NSR  Neurologic Neurologic evaluation reveals -alert and oriented x 3 with no impairment of  recent or remote memory. Mental Status-Normal.  Musculoskeletal Normal Exam - Left-Upper Extremity Strength Normal and Lower Extremity Strength Normal. Normal Exam - Right-Upper Extremity Strength Normal and Lower Extremity Strength Normal.  Lymphatic Head & Neck  General Head & Neck Lymphatics: Bilateral - Description - Normal. Axillary  General Axillary Region: Bilateral -  Description - Normal. Tenderness - Non Tender.    Assessment & Plan ([name] MD; 03/21/2019 3:33 PM)  BREAST CANCER, LEFT (C50.912) Impression: pt has opted for left breast seed lumpectomy with left SLN mapping  Risk of lumpectomy include bleeding, infection, seroma, more surgery, use of seed/wire, wound care, cosmetic deformity and the need for other treatments, death , blood clots, death. Pt agrees to proceed.  Risk of sentinel lymph node mapping include bleeding, infection, lymphedema, shoulder pain. stiffness, dye allergy. cosmetic deformity , blood clots, death, need for more surgery. Pt agrees to proceed.  Current Plans You are being scheduled for surgery- Our schedulers will call you.  You should hear from our office's scheduling department within 5 working days about the location, date, and time of surgery. We try to make accommodations for patient's preferences in scheduling surgery, but sometimes the OR schedule or the surgeon's schedule prevents [place] from making those accommodations.  If you have not heard from our office [phone]) in 5 working days, call the office and ask for your surgeon's nurse.  If you have other questions about your diagnosis, plan, or surgery, call the office and ask for your surgeon's nurse.  We discussed the staging and pathophysiology of breast cancer. We discussed all of the different options for treatment for breast cancer including surgery, chemotherapy, radiation therapy, Herceptin, and antiestrogen therapy. We discussed a sentinel lymph node  biopsy as she does not appear to having lymph node involvement right now. We discussed the performance of that with injection of radioactive tracer and blue dye. We discussed that she would have an incision underneath her axillary hairline. We discussed that there is a bout a 10-20% chance of having a positive node with a sentinel lymph node biopsy and we will await the permanent pathology to make any other first further decisions in terms of her treatment. One of these options might be to return to the operating room to perform an axillary lymph node dissection. We discussed about a 1-2% risk lifetime of chronic shoulder pain as well as lymphedema associated with a sentinel lymph node biopsy. We discussed the options for treatment of the breast cancer which included lumpectomy versus a mastectomy. We discussed the performance of the lumpectomy with a wire placement. We discussed a 10-20% chance of a positive margin requiring reexcision in the operating room. We also discussed that she may need radiation therapy or antiestrogen therapy or both if she undergoes lumpectomy. We discussed the mastectomy and the postoperative care for that as well. We discussed that there is no difference in her survival whether she undergoes lumpectomy with radiation therapy or antiestrogen therapy versus a mastectomy. There is a slight difference in the local recurrence rate being 3-5% with lumpectomy and about 1% with a mastectomy. We discussed the risks of operation including bleeding, infection, possible reoperation. She understands her further therapy will be based on what her stages at the time of her operation.  Pt Education - [facility] breast cancer surgery: discussed with patient and provided information. Pt Education - [facility] Breast Biopsy HCI: discussed with patient and provided information. Pt Education - ABC (After Breast Cancer) Class Info: discussed with patient and provided information. Pt Education - [facility] Breast Pains  Education

## 2019-03-21 NOTE — Therapy (Signed)
[place], [place], [address] Phone: [phone]   Fax:  [phone]  Physical Therapy Evaluation  Patient Details  Name: Kayla Randolph MRN: [id number] Date of Birth: 1946-04-03 Referring Provider (PT): Dr. Erroll Luna   Encounter Date: 03/21/2019  PT End of Session - 03/21/19 1601    Visit Number  1    Number of Visits  2    Date for PT Re-Evaluation  05/16/19    PT Start Time  0712    PT Stop Time  1552    PT Time Calculation (min)  37 min    Activity Tolerance  Patient tolerated treatment well    Behavior During Therapy  [facility] for tasks assessed/performed       Past Medical History:  Diagnosis Date  . Allergy   . History of colon polyps   . Hyperlipidemia   . Interstitial cystitis    History of  . Osteopenia   . Vitamin D deficiency     Past Surgical History:  Procedure Laterality Date  . CESAREAN SECTION    . TONSILLECTOMY      There were no vitals filed for this visit.   Subjective Assessment - 03/21/19 1513    Subjective  Patient reports she is here today to be seen by her medical team for her newly diagnosed left breast cancer.    Patient is accompained by:  Family member    Pertinent History  Patient was diagnosed on 03/15/2019 with left invasive ductal carcinoma breast cancer. It measures 1.1 cm and is located in the lower outer quadrant. It is ER/PR positive and HER2 negative with a Ki67 of 30%.    Patient Stated Goals  Reduce lymphedema risk and learn post op shoulder ROM HEP    Currently in Pain?  No/denies         [facility] PT Assessment - 03/21/19 0001      Assessment   Medical Diagnosis  Left breast cancer    Referring Provider (PT)  Dr. Marcello Moores Cornett    Onset Date/Surgical Date  03/15/19    Hand Dominance  Right    Prior Therapy  none      Precautions   Precautions  Other (comment)    Precaution Comments  active cancer      Restrictions   Weight Bearing  Restrictions  No      Balance Screen   Has the patient fallen in the past 6 months  No    Has the patient had a decrease in activity level because of a fear of falling?   No    Is the patient reluctant to leave their home because of a fear of falling?   No      Home Film/video editor residence    Living Arrangements  Spouse/significant other    Available Help at Discharge  Family      Prior Function   Level of [place]  Retired    Leisure  She does not exercise      Cognition   Overall Cognitive Status  Within Functional Limits for tasks assessed      Posture/Postural Control   Posture/Postural Control  Postural limitations    Postural Limitations  Rounded Shoulders;Forward head   Has some cervical/thoracic scoliosis     ROM / Strength   AROM / PROM / Strength  AROM;Strength      AROM  AROM Assessment Site  Shoulder;Cervical    Right/Left Shoulder  Right;Left    Right Shoulder Extension  50 Degrees    Right Shoulder Flexion  155 Degrees    Right Shoulder ABduction  167 Degrees    Right Shoulder Internal Rotation  79 Degrees    Right Shoulder External Rotation  80 Degrees    Left Shoulder Extension  52 Degrees    Left Shoulder Flexion  144 Degrees    Left Shoulder ABduction  161 Degrees    Left Shoulder Internal Rotation  90 Degrees    Left Shoulder External Rotation  90 Degrees    Cervical Flexion  WNL    Cervical Extension  WNL    Cervical - Right Side Bend  25% limited    Cervical - Left Side Bend  WNL    Cervical - Right Rotation  WNL    Cervical - Left Rotation  25% limited      Strength   Overall Strength  Within functional limits for tasks performed        LYMPHEDEMA/ONCOLOGY QUESTIONNAIRE - 03/21/19 1559      Type   Cancer Type  Left breast cancer      Lymphedema Assessments   Lymphedema Assessments  Upper extremities      Right Upper Extremity Lymphedema   10 cm Proximal to Olecranon Process   21.8 cm    Olecranon Process  20.5 cm    10 cm Proximal to Ulnar Styloid Process  17.5 cm    Just Proximal to Ulnar Styloid Process  13.9 cm    Across Hand at [employer]  16.2 cm    At [place] of 2nd Digit  5.7 cm      Left Upper Extremity Lymphedema   10 cm Proximal to Olecranon Process  21.1 cm    Olecranon Process  20.3 cm    10 cm Proximal to Ulnar Styloid Process  17 cm    Just Proximal to Ulnar Styloid Process  13 cm    Across Hand at [employer]  16.1 cm    At [place] of 2nd Digit  5.4 cm          Quick Dash - 03/21/19 0001    Open a tight or new jar  No difficulty    Do heavy household chores (wash walls, wash floors)  No difficulty    Carry a shopping bag or briefcase  No difficulty    Wash your back  No difficulty    Use a knife to cut food  No difficulty    Recreational activities in which you take some force or impact through your arm, shoulder, or hand (golf, hammering, tennis)  No difficulty    During the past week, to what extent has your arm, shoulder or hand problem interfered with your normal social activities with family, friends, neighbors, or groups?  Not at all    During the past week, to what extent has your arm, shoulder or hand problem limited your work or other regular daily activities  Not at all    Arm, shoulder, or hand pain.  None    Tingling (pins and needles) in your arm, shoulder, or hand  None    Difficulty Sleeping  No difficulty    DASH Score  0 %        Objective measurements completed on examination: See above findings.        Patient was instructed today in a home  exercise program today for post op shoulder range of motion. These included active assist shoulder flexion in sitting, scapular retraction, wall walking with shoulder abduction, and hands behind head external rotation.  She was encouraged to do these twice a day, holding 3 seconds and repeating 5 times when permitted by her physician.          PT Education -  03/21/19 1600    Education Details  Lymphedema risk reduction and post op shoulder ROM HEP    Person(s) Educated  Patient;Spouse    Methods  Explanation;Demonstration    Comprehension  Returned demonstration;Verbalized understanding          PT Long Term Goals - 03/21/19 1603      PT LONG TERM GOAL #1   Title  Patient will demonstrate she has regained full shoulder ROM and function post operatively compared to baseline assessment.    Time  8    Period  Weeks    Status  New    Target Date  05/16/19      Breast Clinic Goals - 03/21/19 1603      Patient will be able to verbalize understanding of pertinent lymphedema risk reduction practices relevant to her diagnosis specifically related to skin care.   Time  1    Period  Days    Status  Achieved      Patient will be able to return demonstrate and/or verbalize understanding of the post-op home exercise program related to regaining shoulder range of motion.   Time  1    Period  Days    Status  Achieved      Patient will be able to verbalize understanding of the importance of attending the postoperative After Breast Cancer Class for further lymphedema risk reduction education and therapeutic exercise.   Time  1    Period  Days    Status  Achieved            Plan - 03/21/19 1601    Clinical Impression Statement  Patient was diagnosed on 03/15/2019 with left invasive ductal carcinoma breast cancer. It measures 1.1 cm and is located in the lower outer quadrant. It is ER/PR positive and HER2 negative with a Ki67 of 30%. Her multidisciplinary medical team met prior to her assessments to determine a recommended treatment plan. She is planning to have a left lumpectomy and sentinel node biopsy folowed by Oncotype testing, radiation and anti-estrogen therapy. She will benefit from a post op PT visit to reassess and determine needs.    Stability/Clinical Decision Making  Stable/Uncomplicated    Clinical Decision Making  Low    Rehab  Potential  Excellent    PT Frequency  --   Eval and 1 f/u visit   PT Treatment/Interventions  ADLs/Self Care Home Management;Patient/family education;Therapeutic exercise    PT Next Visit Plan  Will reassess 3-4 weeks post op to determine needs    PT Home Exercise Plan  Post op shoulder ROM HEP    Consulted and Agree with Plan of Care  Patient;Family member/caregiver    Family Member Consulted  Husband       Patient will benefit from skilled therapeutic intervention in order to improve the following deficits and impairments:  Postural dysfunction, Decreased range of motion, Impaired UE functional use, Pain, Decreased knowledge of precautions  Visit Diagnosis: Malignant neoplasm of lower-outer quadrant of left breast of female, estrogen receptor positive ([place]) - Plan: PT plan of care cert/re-cert  Abnormal posture - Plan: PT  plan of care cert/re-cert   Patient will follow up at outpatient cancer rehab 3-4 weeks following surgery.  If the patient requires physical therapy at that time, a specific plan will be dictated and sent to the referring physician for approval. The patient was educated today on appropriate basic range of motion exercises to begin post operatively and the importance of attending the After Breast Cancer class following surgery.  Patient was educated today on lymphedema risk reduction practices as it pertains to recommendations that will benefit the patient immediately following surgery.  She verbalized good understanding.      Problem List Patient Active Problem List   Diagnosis Date Noted  . Malignant neoplasm of upper-inner quadrant of left breast in female, estrogen receptor positive ([place]) 03/16/2019  . Primary osteoarthritis involving multiple joints 10/04/2017  . Hypothyroidism due to acquired atrophy of thyroid 05/31/2017  . Depression 04/03/2013  . Hyperlipemia 12/14/2012  . Vitamin D deficiency 12/14/2012  . Allergic rhinitis 12/14/2012  . Cervical spine  arthritis 12/14/2012  . 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 Osteopenia    Annia Friendly, [place] 03/21/19 4:06 PM  [place], [place], [address] Phone: [phone]   Fax:  [phone]  Name: OLIANA GOWENS MRN: [id number] Date of Birth: 07-26-1945

## 2019-03-21 NOTE — Patient Instructions (Signed)

## 2019-03-22 ENCOUNTER — Telehealth: Payer: Self-pay | Admitting: Oncology

## 2019-03-22 ENCOUNTER — Encounter: Payer: Self-pay | Admitting: General Practice

## 2019-03-22 NOTE — Progress Notes (Signed)
[place] Psychosocial Distress Screening Spiritual Care  Met with Kayla Randolph and husband [name] in [facility] to introduce [place] team/resources, reviewing distress screen per protocol.  The patient scored a 7 on the Psychosocial Distress Thermometer which indicates severe distress. Also assessed for distress and other psychosocial needs.   ON[facility] DISTRESS SCREENING 03/22/2019  Screening Type Initial Screening  Distress experienced in past week (1-10) 7  Emotional problem type Nervousness/Anxiety  Information Concerns Type Lack of info about diagnosis;Lack of info about treatment;Lack of info about complementary therapy choices  Physical Problem type Sleep/insomnia   Couple found [facility] very helpful for communication and meeting team, and they both report significantly reduced stress thereafter. Provided empathic listening, normalization of feelings, and introduction to [place] team/programming resources.  Follow up needed: No. Per patient and spouse, no other needs at this time, but they know to contact team with any questions or needs that arise. Please also page if circumstances change. Thank you!   [place], [place], [facility] Pager [phone] Voicemail [phone]

## 2019-03-22 NOTE — Telephone Encounter (Signed)
I talk with patient regarding schedule  

## 2019-03-23 ENCOUNTER — Other Ambulatory Visit: Payer: Self-pay | Admitting: Surgery

## 2019-03-23 DIAGNOSIS — C50912 Malignant neoplasm of unspecified site of left female breast: Secondary | ICD-10-CM

## 2019-03-28 ENCOUNTER — Other Ambulatory Visit: Payer: Self-pay

## 2019-03-28 ENCOUNTER — Encounter (HOSPITAL_BASED_OUTPATIENT_CLINIC_OR_DEPARTMENT_OTHER): Payer: Self-pay | Admitting: *Deleted

## 2019-03-29 ENCOUNTER — Telehealth: Payer: Self-pay | Admitting: *Deleted

## 2019-03-29 NOTE — Progress Notes (Signed)

## 2019-03-29 NOTE — Telephone Encounter (Signed)
Spoke to pt concerning [place] from 03/21/19. Denies questions or concerns regarding dx or treatment care plan. Encourage pt call with needs. Received verbal understanding.

## 2019-03-30 ENCOUNTER — Other Ambulatory Visit (HOSPITAL_COMMUNITY)
Admission: RE | Admit: 2019-03-30 | Discharge: 2019-03-30 | Disposition: A | Discharge status: Home / Self Care | Payer: Medicare Other | Source: Ambulatory Visit | Attending: Surgery | Admitting: Surgery

## 2019-03-30 DIAGNOSIS — Z01812 Encounter for preprocedural laboratory examination: Secondary | ICD-10-CM | POA: Insufficient documentation

## 2019-03-30 DIAGNOSIS — Z20828 Contact with and (suspected) exposure to other viral communicable diseases: Secondary | ICD-10-CM | POA: Insufficient documentation

## 2019-03-30 LAB — SARS CORONAVIRUS 2 (TAT 6-24 HRS): SARS Coronavirus 2: NEGATIVE

## 2019-04-02 ENCOUNTER — Other Ambulatory Visit: Payer: Self-pay

## 2019-04-02 ENCOUNTER — Inpatient Hospital Stay: Admission: RE | Admit: 2019-04-02 | Payer: Medicare Other | Source: Ambulatory Visit

## 2019-04-02 ENCOUNTER — Other Ambulatory Visit: Payer: Self-pay | Admitting: Surgery

## 2019-04-02 ENCOUNTER — Ambulatory Visit
Admission: RE | Admit: 2019-04-02 | Discharge: 2019-04-02 | Disposition: A | Discharge status: Home / Self Care | Payer: Medicare Other | Source: Ambulatory Visit | Attending: Surgery | Admitting: Surgery

## 2019-04-02 DIAGNOSIS — C50912 Malignant neoplasm of unspecified site of left female breast: Secondary | ICD-10-CM

## 2019-04-02 DIAGNOSIS — Z170 Estrogen receptor positive status [ER+]: Secondary | ICD-10-CM

## 2019-04-02 DIAGNOSIS — N6320 Unspecified lump in the left breast, unspecified quadrant: Secondary | ICD-10-CM

## 2019-04-02 DIAGNOSIS — R928 Other abnormal and inconclusive findings on diagnostic imaging of breast: Secondary | ICD-10-CM | POA: Diagnosis not present

## 2019-04-03 ENCOUNTER — Ambulatory Visit (HOSPITAL_BASED_OUTPATIENT_CLINIC_OR_DEPARTMENT_OTHER): Payer: Medicare Other | Admitting: Certified Registered Nurse Anesthetist

## 2019-04-03 ENCOUNTER — Other Ambulatory Visit: Payer: Self-pay

## 2019-04-03 ENCOUNTER — Ambulatory Visit
Admission: RE | Admit: 2019-04-03 | Discharge: 2019-04-03 | Disposition: A | Discharge status: Home / Self Care | Payer: Medicare Other | Source: Ambulatory Visit | Attending: Surgery | Admitting: Surgery

## 2019-04-03 ENCOUNTER — Ambulatory Visit (HOSPITAL_COMMUNITY)
Admission: RE | Admit: 2019-04-03 | Discharge: 2019-04-03 | Disposition: A | Discharge status: Home / Self Care | Payer: Medicare Other | Source: Ambulatory Visit | Attending: Surgery | Admitting: Surgery

## 2019-04-03 ENCOUNTER — Encounter (HOSPITAL_BASED_OUTPATIENT_CLINIC_OR_DEPARTMENT_OTHER)
Admission: RE | Disposition: A | Discharge status: Home / Self Care | Payer: Self-pay | Source: Home / Self Care | Attending: Surgery

## 2019-04-03 ENCOUNTER — Ambulatory Visit (HOSPITAL_BASED_OUTPATIENT_CLINIC_OR_DEPARTMENT_OTHER)
Admission: RE | Admit: 2019-04-03 | Discharge: 2019-04-03 | Disposition: A | Discharge status: Home / Self Care | Payer: Medicare Other | Attending: Surgery | Admitting: Surgery

## 2019-04-03 ENCOUNTER — Encounter (HOSPITAL_BASED_OUTPATIENT_CLINIC_OR_DEPARTMENT_OTHER): Payer: Self-pay

## 2019-04-03 DIAGNOSIS — Z170 Estrogen receptor positive status [ER+]: Secondary | ICD-10-CM | POA: Diagnosis not present

## 2019-04-03 DIAGNOSIS — C50912 Malignant neoplasm of unspecified site of left female breast: Secondary | ICD-10-CM

## 2019-04-03 DIAGNOSIS — Z881 Allergy status to other antibiotic agents status: Secondary | ICD-10-CM | POA: Insufficient documentation

## 2019-04-03 DIAGNOSIS — C50512 Malignant neoplasm of lower-outer quadrant of left female breast: Secondary | ICD-10-CM | POA: Diagnosis not present

## 2019-04-03 DIAGNOSIS — F419 Anxiety disorder, unspecified: Secondary | ICD-10-CM | POA: Diagnosis not present

## 2019-04-03 DIAGNOSIS — Z882 Allergy status to sulfonamides status: Secondary | ICD-10-CM | POA: Insufficient documentation

## 2019-04-03 DIAGNOSIS — F329 Major depressive disorder, single episode, unspecified: Secondary | ICD-10-CM | POA: Insufficient documentation

## 2019-04-03 DIAGNOSIS — E785 Hyperlipidemia, unspecified: Secondary | ICD-10-CM | POA: Insufficient documentation

## 2019-04-03 DIAGNOSIS — C50212 Malignant neoplasm of upper-inner quadrant of left female breast: Secondary | ICD-10-CM | POA: Diagnosis not present

## 2019-04-03 DIAGNOSIS — E7800 Pure hypercholesterolemia, unspecified: Secondary | ICD-10-CM | POA: Insufficient documentation

## 2019-04-03 DIAGNOSIS — E039 Hypothyroidism, unspecified: Secondary | ICD-10-CM | POA: Insufficient documentation

## 2019-04-03 DIAGNOSIS — Z888 Allergy status to other drugs, medicaments and biological substances status: Secondary | ICD-10-CM | POA: Diagnosis not present

## 2019-04-03 DIAGNOSIS — M1990 Unspecified osteoarthritis, unspecified site: Secondary | ICD-10-CM | POA: Diagnosis not present

## 2019-04-03 DIAGNOSIS — E559 Vitamin D deficiency, unspecified: Secondary | ICD-10-CM | POA: Insufficient documentation

## 2019-04-03 DIAGNOSIS — Z9889 Other specified postprocedural states: Secondary | ICD-10-CM

## 2019-04-03 DIAGNOSIS — G8918 Other acute postprocedural pain: Secondary | ICD-10-CM | POA: Diagnosis not present

## 2019-04-03 HISTORY — DX: Anxiety disorder, unspecified: F41.9

## 2019-04-03 HISTORY — DX: Hypothyroidism, unspecified: E03.9

## 2019-04-03 HISTORY — DX: Other specified postprocedural states: Z98.890

## 2019-04-03 HISTORY — DX: Depression, unspecified: F32.A

## 2019-04-03 HISTORY — PX: BREAST LUMPECTOMY WITH RADIOACTIVE SEED AND SENTINEL LYMPH NODE BIOPSY: SHX6550

## 2019-04-03 SURGERY — BREAST LUMPECTOMY WITH RADIOACTIVE SEED AND SENTINEL LYMPH NODE BIOPSY
Anesthesia: General | Site: Breast | Laterality: Left

## 2019-04-03 MED ORDER — TECHNETIUM TC 99M SULFUR COLLOID FILTERED
1.0000 | Freq: Once | INTRAVENOUS | Status: AC | PRN
  Administered 2019-04-03: 1 via INTRADERMAL

## 2019-04-03 MED ORDER — BUPIVACAINE HCL (PF) 0.25 % IJ SOLN
INTRAMUSCULAR | Status: DC | PRN
  Administered 2019-04-03: 10 mL

## 2019-04-03 MED ORDER — FENTANYL CITRATE (PF) 100 MCG/2ML IJ SOLN
INTRAMUSCULAR | Status: DC | PRN
  Administered 2019-04-03: 50 ug via INTRAVENOUS

## 2019-04-03 MED ORDER — PROPOFOL 10 MG/ML IV BOLUS
INTRAVENOUS | Status: DC | PRN
  Administered 2019-04-03: 120 mg via INTRAVENOUS

## 2019-04-03 MED ORDER — CEFAZOLIN SODIUM-DEXTROSE 2-4 GM/100ML-% IV SOLN
2.0000 g | INTRAVENOUS | Status: AC
  Administered 2019-04-03: 2 g via INTRAVENOUS

## 2019-04-03 MED ORDER — MIDAZOLAM HCL 5 MG/5ML IJ SOLN
INTRAMUSCULAR | Status: DC | PRN
  Administered 2019-04-03: 1 mg via INTRAVENOUS

## 2019-04-03 MED ORDER — EPHEDRINE SULFATE 50 MG/ML IJ SOLN
INTRAMUSCULAR | Status: DC | PRN
  Administered 2019-04-03 (×4): 10 mg via INTRAVENOUS

## 2019-04-03 MED ORDER — ACETAMINOPHEN 500 MG PO TABS
ORAL_TABLET | ORAL | Status: AC
  Filled 2019-04-03: qty 2

## 2019-04-03 MED ORDER — CHLORHEXIDINE GLUCONATE CLOTH 2 % EX PADS: 6.0000 | MEDICATED_PAD | Freq: Once | CUTANEOUS | Status: DC

## 2019-04-03 MED ORDER — ACETAMINOPHEN 500 MG PO TABS
1000.0000 mg | ORAL_TABLET | ORAL | Status: AC
  Administered 2019-04-03: 1000 mg via ORAL

## 2019-04-03 MED ORDER — FENTANYL CITRATE (PF) 100 MCG/2ML IJ SOLN
INTRAMUSCULAR | Status: AC
  Filled 2019-04-03: qty 2

## 2019-04-03 MED ORDER — BUPIVACAINE-EPINEPHRINE (PF) 0.5% -1:200000 IJ SOLN
INTRAMUSCULAR | Status: DC | PRN
  Administered 2019-04-03: 20 mL via PERINEURAL

## 2019-04-03 MED ORDER — MIDAZOLAM HCL 2 MG/2ML IJ SOLN
1.0000 mg | INTRAMUSCULAR | Status: DC | PRN
  Administered 2019-04-03: 1 mg via INTRAVENOUS

## 2019-04-03 MED ORDER — LIDOCAINE 2% (20 MG/ML) 5 ML SYRINGE
INTRAMUSCULAR | Status: AC
  Filled 2019-04-03: qty 5

## 2019-04-03 MED ORDER — EPHEDRINE 5 MG/ML INJ
INTRAVENOUS | Status: AC
  Filled 2019-04-03: qty 10

## 2019-04-03 MED ORDER — MIDAZOLAM HCL 2 MG/2ML IJ SOLN
INTRAMUSCULAR | Status: AC
  Filled 2019-04-03: qty 2

## 2019-04-03 MED ORDER — OXYCODONE HCL 5 MG PO TABS: 5.0000 mg | ORAL_TABLET | Freq: Once | ORAL | Status: DC | PRN

## 2019-04-03 MED ORDER — FENTANYL CITRATE (PF) 100 MCG/2ML IJ SOLN: 25.0000 ug | INTRAMUSCULAR | Status: DC | PRN

## 2019-04-03 MED ORDER — CEFAZOLIN SODIUM-DEXTROSE 2-4 GM/100ML-% IV SOLN
INTRAVENOUS | Status: AC
  Filled 2019-04-03: qty 100

## 2019-04-03 MED ORDER — FENTANYL CITRATE (PF) 100 MCG/2ML IJ SOLN
50.0000 ug | INTRAMUSCULAR | Status: DC | PRN
  Administered 2019-04-03: 50 ug via INTRAVENOUS

## 2019-04-03 MED ORDER — ONDANSETRON HCL 4 MG/2ML IJ SOLN: 4.0000 mg | Freq: Four times a day (QID) | INTRAMUSCULAR | Status: DC | PRN

## 2019-04-03 MED ORDER — ONDANSETRON HCL 4 MG/2ML IJ SOLN
INTRAMUSCULAR | Status: DC | PRN
  Administered 2019-04-03: 4 mg via INTRAVENOUS

## 2019-04-03 MED ORDER — LIDOCAINE HCL (CARDIAC) PF 100 MG/5ML IV SOSY
PREFILLED_SYRINGE | INTRAVENOUS | Status: DC | PRN
  Administered 2019-04-03: 50 mg via INTRAVENOUS

## 2019-04-03 MED ORDER — ONDANSETRON HCL 4 MG/2ML IJ SOLN
INTRAMUSCULAR | Status: AC
  Filled 2019-04-03: qty 2

## 2019-04-03 MED ORDER — PROPOFOL 10 MG/ML IV BOLUS
INTRAVENOUS | Status: AC
  Filled 2019-04-03: qty 20

## 2019-04-03 MED ORDER — OXYCODONE HCL 5 MG/5ML PO SOLN: 5.0000 mg | Freq: Once | ORAL | Status: DC | PRN

## 2019-04-03 MED ORDER — DEXAMETHASONE SODIUM PHOSPHATE 4 MG/ML IJ SOLN
INTRAMUSCULAR | Status: DC | PRN
  Administered 2019-04-03: 5 mg via INTRAVENOUS

## 2019-04-03 MED ORDER — HYDROCODONE-ACETAMINOPHEN 5-325 MG PO TABS: 1.0000 | ORAL_TABLET | Freq: Four times a day (QID) | ORAL | 0 refills | Status: DC | PRN

## 2019-04-03 MED ORDER — LACTATED RINGERS IV SOLN
INTRAVENOUS | Status: DC
  Administered 2019-04-03 (×2): via INTRAVENOUS

## 2019-04-03 MED ORDER — SODIUM CHLORIDE (PF) 0.9 % IJ SOLN
INTRAVENOUS | Status: DC | PRN
  Administered 2019-04-03: 4 mL

## 2019-04-03 SURGICAL SUPPLY — 54 items
ADH SKN CLS APL DERMABOND .7 (GAUZE/BANDAGES/DRESSINGS) ×1
APL PRP STRL LF DISP 70% ISPRP (MISCELLANEOUS) ×1
APPLIER CLIP 9.375 MED OPEN (MISCELLANEOUS) ×4
APR CLP MED 9.3 20 MLT OPN (MISCELLANEOUS) ×2
BINDER BREAST LRG (GAUZE/BANDAGES/DRESSINGS) IMPLANT
BINDER BREAST MEDIUM (GAUZE/BANDAGES/DRESSINGS) ×1 IMPLANT
BINDER BREAST XLRG (GAUZE/BANDAGES/DRESSINGS) IMPLANT
BINDER BREAST XXLRG (GAUZE/BANDAGES/DRESSINGS) IMPLANT
BLADE SURG 15 STRL LF DISP TIS (BLADE) ×1 IMPLANT
BLADE SURG 15 STRL SS (BLADE) ×2
CANISTER SUC SOCK COL 7IN (MISCELLANEOUS) IMPLANT
CANISTER SUCT 1200ML W/VALVE (MISCELLANEOUS) ×2 IMPLANT
CHLORAPREP W/TINT 26 (MISCELLANEOUS) ×2 IMPLANT
CLIP APPLIE 9.375 MED OPEN (MISCELLANEOUS) ×1 IMPLANT
COVER BACK TABLE REUSABLE LG (DRAPES) ×2 IMPLANT
COVER MAYO STAND REUSABLE (DRAPES) ×2 IMPLANT
COVER PROBE W GEL 5X96 (DRAPES) ×2 IMPLANT
COVER WAND RF STERILE (DRAPES) IMPLANT
DECANTER SPIKE VIAL GLASS SM (MISCELLANEOUS) IMPLANT
DERMABOND ADVANCED (GAUZE/BANDAGES/DRESSINGS) ×1
DERMABOND ADVANCED .7 DNX12 (GAUZE/BANDAGES/DRESSINGS) ×1 IMPLANT
DRAPE LAPAROSCOPIC ABDOMINAL (DRAPES) ×2 IMPLANT
DRAPE UTILITY XL STRL (DRAPES) ×2 IMPLANT
ELECT COATED BLADE 2.86 ST (ELECTRODE) ×2 IMPLANT
ELECT REM PT RETURN 9FT ADLT (ELECTROSURGICAL) ×2
ELECTRODE REM PT RTRN 9FT ADLT (ELECTROSURGICAL) ×1 IMPLANT
GLOVE BIOGEL PI IND STRL 7.0 (GLOVE) IMPLANT
GLOVE BIOGEL PI IND STRL 8 (GLOVE) ×1 IMPLANT
GLOVE BIOGEL PI INDICATOR 7.0 (GLOVE) ×1
GLOVE BIOGEL PI INDICATOR 8 (GLOVE) ×1
GLOVE ECLIPSE 6.5 STRL STRAW (GLOVE) ×1 IMPLANT
GLOVE ECLIPSE 8.0 STRL XLNG CF (GLOVE) ×2 IMPLANT
GLOVE EXAM NITRILE MD LF STRL (GLOVE) ×1 IMPLANT
GOWN STRL REUS W/ TWL LRG LVL3 (GOWN DISPOSABLE) ×2 IMPLANT
GOWN STRL REUS W/TWL LRG LVL3 (GOWN DISPOSABLE) ×4
HEMOSTAT ARISTA ABSORB 3G PWDR (HEMOSTASIS) IMPLANT
HEMOSTAT SNOW SURGICEL 2X4 (HEMOSTASIS) IMPLANT
KIT MARKER MARGIN INK (KITS) ×2 IMPLANT
NDL HYPO 25X1 1.5 SAFETY (NEEDLE) ×1 IMPLANT
NDL SAFETY ECLIPSE 18X1.5 (NEEDLE) IMPLANT
NEEDLE HYPO 18GX1.5 SHARP (NEEDLE)
NEEDLE HYPO 25X1 1.5 SAFETY (NEEDLE) ×4 IMPLANT
NS IRRIG 1000ML POUR BTL (IV SOLUTION) ×2 IMPLANT
PACK BASIN DAY SURGERY FS (CUSTOM PROCEDURE TRAY) ×2 IMPLANT
PENCIL SMOKE EVACUATOR (MISCELLANEOUS) ×2 IMPLANT
SLEEVE SCD COMPRESS KNEE MED (MISCELLANEOUS) ×2 IMPLANT
SPONGE LAP 4X18 RFD (DISPOSABLE) ×2 IMPLANT
SUT MNCRL AB 4-0 PS2 18 (SUTURE) ×2 IMPLANT
SUT VICRYL 3-0 CR8 SH (SUTURE) ×2 IMPLANT
SYR CONTROL 10ML LL (SYRINGE) ×4 IMPLANT
TOWEL GREEN STERILE FF (TOWEL DISPOSABLE) ×2 IMPLANT
TRAY FAXITRON CT DISP (TRAY / TRAY PROCEDURE) ×2 IMPLANT
TUBE CONNECTING 20X1/4 (TUBING) ×2 IMPLANT
YANKAUER SUCT BULB TIP NO VENT (SUCTIONS) ×2 IMPLANT

## 2019-04-03 NOTE — Discharge Instructions (Signed)
[facility] °Office Phone Number [phone] ° °BREAST BIOPSY/ PARTIAL MASTECTOMY: POST OP INSTRUCTIONS ° °Always review your discharge instruction sheet given to you by the facility where your surgery was performed. ° °IF YOU HAVE DISABILITY OR FAMILY LEAVE FORMS, YOU MUST BRING THEM TO THE OFFICE FOR PROCESSING.  DO NOT GIVE THEM TO YOUR DOCTOR. ° °1. A prescription for pain medication may be given to you upon discharge.  Take your pain medication as prescribed, if needed.  If narcotic pain medicine is not needed, then you may take acetaminophen (Tylenol) or ibuprofen (Advil) as needed. °2. Take your usually prescribed medications unless otherwise directed °3. If you need a refill on your pain medication, please contact your pharmacy.  They will contact our office to request authorization.  Prescriptions will not be filled after 5pm or on week-ends. °4. You should eat very light the first 24 hours after surgery, such as soup, crackers, pudding, etc.  Resume your normal diet the day after surgery. °5. Most patients will experience some swelling and bruising in the breast.  Ice packs and a good support bra will help.  Swelling and bruising can take several days to resolve.  °6. It is common to experience some constipation if taking pain medication after surgery.  Increasing fluid intake and taking a stool softener will usually help or prevent this problem from occurring.  A mild laxative (Milk of Magnesia or Miralax) should be taken according to package directions if there are no bowel movements after 48 hours. °7. Unless discharge instructions indicate otherwise, you may remove your bandages 24-48 hours after surgery, and you may shower at that time.  You may have steri-strips (small skin tapes) in place directly over the incision.  These strips should be left on the skin for 7-10 days.  If your surgeon used skin glue on the incision, you may shower in 24 hours.  The glue will flake off over the  next 2-3 weeks.  Any sutures or staples will be removed at the office during your follow-up visit. °8. ACTIVITIES:  You may resume regular daily activities (gradually increasing) beginning the next day.  Wearing a good support bra or sports bra minimizes pain and swelling.  You may have sexual intercourse when it is comfortable. °a. You may drive when you no longer are taking prescription pain medication, you can comfortably wear a seatbelt, and you can safely maneuver your car and apply brakes. °b. RETURN TO WORK:  ______________________________________________________________________________________ °9. You should see your doctor in the office for a follow-up appointment approximately two weeks after your surgery.  Your doctor’s nurse will typically make your follow-up appointment when she calls you with your pathology report.  Expect your pathology report 2-3 business days after your surgery.  You may call to check if you do not hear from us after three days. °10. OTHER INSTRUCTIONS: _______________________________________________________________________________________________ _____________________________________________________________________________________________________________________________________ °_____________________________________________________________________________________________________________________________________ °_____________________________________________________________________________________________________________________________________ ° °WHEN TO CALL YOUR DOCTOR: °1. Fever over 101.0 °2. Nausea and/or vomiting. °3. Extreme swelling or bruising. °4. Continued bleeding from incision. °5. Increased pain, redness, or drainage from the incision. ° °The clinic staff is available to answer your questions during regular business hours.  Please don’t hesitate to call and ask to speak to one of the nurses for clinical concerns.  If you have a medical emergency, go to the nearest  emergency room or call 911.  A surgeon from [facility] is always on call at the hospital. ° °For further questions, please visit [email or web address]  ° ° °  Next dose of Tylenol can be taken at 4pm today   Post Anesthesia Home Care Instructions  Activity: Get plenty of rest for the remainder of the day. A responsible individual must stay with you for 24 hours following the procedure.  For the next 24 hours, DO NOT: -Drive a car -Paediatric nurse -Drink alcoholic beverages -Take any medication unless instructed by your physician -Make any legal decisions or sign important papers.  Meals: Start with liquid foods such as gelatin or soup. Progress to regular foods as tolerated. Avoid greasy, spicy, heavy foods. If nausea and/or vomiting occur, drink only clear liquids until the nausea and/or vomiting subsides. Call your physician if vomiting continues.  Special Instructions/Symptoms: Your throat may feel dry or sore from the anesthesia or the breathing tube placed in your throat during surgery. If this causes discomfort, gargle with warm salt water. The discomfort should disappear within 24 hours.  If you had a scopolamine patch placed behind your ear for the management of post- operative nausea and/or vomiting:  1. The medication in the patch is effective for 72 hours, after which it should be removed.  Wrap patch in a tissue and discard in the trash. Wash hands thoroughly with soap and water. 2. You may remove the patch earlier than 72 hours if you experience unpleasant side effects which may include dry mouth, dizziness or visual disturbances. 3. Avoid touching the patch. Wash your hands with soap and water after contact with the patch.    Regional Anesthesia Blocks  1. Numbness or the inability to move the "blocked" extremity may last from 3-48 hours after placement. The length of time depends on the medication injected and your individual response to the medication. If  the numbness is not going away after 48 hours, call your surgeon.  2. The extremity that is blocked will need to be protected until the numbness is gone and the  Strength has returned. Because you cannot feel it, you will need to take extra care to avoid injury. Because it may be weak, you may have difficulty moving it or using it. You may not know what position it is in without looking at it while the block is in effect.  3. For blocks in the legs and feet, returning to weight bearing and walking needs to be done carefully. You will need to wait until the numbness is entirely gone and the strength has returned. You should be able to move your leg and foot normally before you try and bear weight or walk. You will need someone to be with you when you first try to ensure you do not fall and possibly risk injury.  4. Bruising and tenderness at the needle site are common side effects and will resolve in a few days.  5. Persistent numbness or new problems with movement should be communicated to the surgeon or the [place] [phone] [place] [phone]).

## 2019-04-03 NOTE — Op Note (Signed)
Preoperative diagnosis stage I left breast cancer lower outer quadrant  Postoperative diagnosis: Same  Procedure: Left breast seed lumpectomy with left axillary sentinel lymph node mapping using methylene blue dye  Surgeon: Erroll Luna, MD  Anesthesia: LMA with local and pectoral block  EBL: 30 cc  Drains: None  Specimen: Left breast tissue with seed and clip verified by Faxitron and 3 left axillary sentinel nodes 2 were blue and hot the third was just hot  Indications for procedure: The patient is a 73 year old female with stage I left breast cancer lower outer quadrant.  She opted for breast conserving surgery after discussion of all of her options in the multidisciplinary breast clinic.The procedure has been discussed with the patient. Alternatives to surgery have been discussed with the patient.  Risks of surgery include bleeding,  Infection,  Seroma formation, death,  and the need for further surgery.   The patient understands and wishes to proceed.Sentinel lymph node mapping and dissection has been discussed with the patient.  Risk of bleeding,  Infection,  Seroma formation,  Additional procedures,,  Shoulder weakness ,  Shoulder stiffness,  Nerve and blood vessel injury and reaction to the mapping dyes have been discussed.  Alternatives to surgery have been discussed with the patient.  The patient agrees to proceed.   Description of procedure: The patient was met in the holding area and questions were answered.  The neoprobe was used to verify seed location left breast lower outer quadrant.  She underwent a pectoral block per anesthesia and injection with technetium sulfur colloid per radiology protocol.  She was then taken back to the operating.  She is placed supine upon the OR table.  Induction of general esthesia left breast was prepped and draped in a sterile fashion and timeout was performed.  Neoprobe was used to verify seed location left breast lower outer quadrant near the  inferior mammary fold.  Incision was made along the inferior mammary fold in the lower outer quadrant of the left breast.  Dissection was carried up to the mass and all tissue around the mass was excised with grossly negative margins.  This was inked and Faxitron image revealed seed and clip to be present in the specimen.  This was sent to pathology.  The cavities made hemostatic with cautery and then clips used to mark the cavity.  He was then closed with 3-0 Vicryl and 4-0 Monocryl.  The neoprobe settings were changed to technetium.  4 cc of methylene blue dye were injected into the nipple massaged for 5 minutes.  Neoprobe used and after incision was made in left axilla dissection carried down into the level 1 deep contents.  There were 3 hot left axillary sentinel nodes.  To these were blue.  We removed these nodes.  These were passed off the field.  Background counts approached 0.  The long thoracic nerve, thoracodorsal trunk and axillary vein were all preserved.  Hemostasis achieved and then cavity closed with 3-0 Vicryl and 4-0 Monocryl.  Dermabond applied.  All counts found to be correct.  The patient was awoke extubated taken to recovery in satisfactory condition.  Breast binder was placed in the OR.

## 2019-04-03 NOTE — Progress Notes (Signed)
Assisted patient with emotional support during nuc med injections.

## 2019-04-03 NOTE — Progress Notes (Signed)
Assisted Dr. Marcie Bal with left, ultrasound guided, pectoralis block. Side rails up, monitors on throughout procedure. See vital signs in flow sheet. Tolerated Procedure well.

## 2019-04-03 NOTE — Transfer of Care (Signed)
Immediate Anesthesia Transfer of Care Note  Patient: Kayla Randolph  Procedure(s) Performed: LEFT BREAST LUMPECTOMY WITH RADIOACTIVE SEED AND SENTINEL LYMPH NODE MAPPING (Left Breast)  Patient Location: PACU  Anesthesia Type:General  Level of Consciousness: awake, alert  and oriented  Airway & Oxygen Therapy: Patient Spontanous Breathing and Patient connected to nasal cannula oxygen  Post-op Assessment: Report given to RN and Post -op Vital signs reviewed and stable  Post vital signs: Reviewed and stable  Last Vitals:  Vitals Value Taken Time  BP    Temp    Pulse 66 04/03/19 1251  Resp 9 04/03/19 1251  SpO2 100 % 04/03/19 1251  Vitals shown include unvalidated device data.  Last Pain:  Vitals:   04/03/19 1001  TempSrc: Oral  PainSc: 0-No pain      Patients Stated Pain Goal: 6 ([id number])  Complications: No apparent anesthesia complications

## 2019-04-03 NOTE — Anesthesia Procedure Notes (Signed)
Anesthesia Regional Block: Pectoralis block   Pre-Anesthetic Checklist: ,, timeout performed, Correct Patient, Correct Site, Correct Laterality, Correct Procedure, Correct Position, site marked, Risks and benefits discussed,  Surgical consent,  Pre-op evaluation,  At surgeon's request and post-op pain management  Laterality: Left  Prep: chloraprep       Needles:  Injection technique: Single-shot  Needle Type: Echogenic Needle     Needle Length: 9cm  Needle Gauge: 21     Additional Needles:   Narrative:  Start time: 04/03/2019 11:01 AM End time: 04/03/2019 11:10 AM Injection made incrementally with aspirations every 5 mL.  Performed by: Personally  Anesthesiologist: Albertha Ghee, MD  Additional Notes: Pt tolerated the procedure well.

## 2019-04-03 NOTE — Anesthesia Postprocedure Evaluation (Signed)
Anesthesia Post Note  Patient: Kayla Randolph  Procedure(s) Performed: LEFT BREAST LUMPECTOMY WITH RADIOACTIVE SEED AND SENTINEL LYMPH NODE MAPPING (Left Breast)     Anesthesia Post Evaluation  Last Vitals:  Vitals:   04/03/19 1300 04/03/19 1305  BP: (!) 146/67   Pulse: 76 73  Resp: 13 15  Temp:    SpO2: 100% 100%    Last Pain:  Vitals:   04/03/19 1001  TempSrc: Oral  PainSc: 0-No pain                 [name] F

## 2019-04-03 NOTE — Anesthesia Preprocedure Evaluation (Signed)
Anesthesia Evaluation  Patient identified by MRN, date of birth, ID band Patient awake    Reviewed: Allergy & Precautions, H&P , NPO status , Patient's Chart, lab work & pertinent test results  Airway Mallampati: II   Neck ROM: full    Dental   Pulmonary neg pulmonary ROS,    breath sounds clear to auscultation       Cardiovascular negative cardio ROS   Rhythm:regular Rate:Normal     Neuro/Psych PSYCHIATRIC DISORDERS Anxiety Depression  Neuromuscular disease    GI/Hepatic   Endo/Other  Hypothyroidism   Renal/GU      Musculoskeletal  (+) Arthritis ,   Abdominal   Peds  Hematology   Anesthesia Other Findings   Reproductive/Obstetrics                             Anesthesia Physical Anesthesia Plan  ASA: II  Anesthesia Plan: General   Post-op Pain Management:  Regional for Post-op pain   Induction: Intravenous  PONV Risk Score and Plan: 3 and Ondansetron, Dexamethasone, Treatment may vary due to age or medical condition and Midazolam  Airway Management Planned: LMA  Additional Equipment:   Intra-op Plan:   Post-operative Plan:   Informed Consent: I have reviewed the patients History and Physical, chart, labs and discussed the procedure including the risks, benefits and alternatives for the proposed anesthesia with the patient or authorized representative who has indicated his/her understanding and acceptance.       Plan Discussed with: CRNA, Anesthesiologist and Surgeon  Anesthesia Plan Comments:         Anesthesia Quick Evaluation

## 2019-04-03 NOTE — Interval H&P Note (Signed)
History and Physical Interval Note:  04/03/2019 11:03 AM  Kayla Randolph  has presented today for surgery, with the diagnosis of BREAST CANCER.  The various methods of treatment have been discussed with the patient and family. After consideration of risks, benefits and other options for treatment, the patient has consented to  Procedure(s): LEFT BREAST LUMPECTOMY WITH RADIOACTIVE SEED AND SENTINEL [place] (Left) as a surgical intervention.  The patient's history has been reviewed, patient examined, no change in status, stable for surgery.  I have reviewed the patient's chart and labs.  Questions were answered to the patient's satisfaction.     [place]

## 2019-04-03 NOTE — Anesthesia Procedure Notes (Signed)
Procedure Name: LMA Insertion Date/Time: 04/03/2019 11:35 AM Performed by: Bufford Spikes, CRNA Pre-anesthesia Checklist: Patient identified, Emergency Drugs available, Suction available and Patient being monitored Patient Re-evaluated:Patient Re-evaluated prior to induction Oxygen Delivery Method: Circle system utilized Preoxygenation: Pre-oxygenation with 100% oxygen Induction Type: IV induction Ventilation: Mask ventilation without difficulty LMA: LMA inserted LMA Size: 3.0 Number of attempts: 1 Airway Equipment and Method: Bite block Placement Confirmation: positive ETCO2 Tube secured with: Tape Dental Injury: Teeth and Oropharynx as per pre-operative assessment

## 2019-04-04 ENCOUNTER — Encounter (HOSPITAL_BASED_OUTPATIENT_CLINIC_OR_DEPARTMENT_OTHER): Payer: Self-pay | Admitting: Surgery

## 2019-04-04 NOTE — Addendum Note (Signed)
Addendum  created 04/04/19 1447 by Tawni Millers, CRNA   Charge Capture section accepted

## 2019-04-05 ENCOUNTER — Telehealth: Payer: Self-pay | Admitting: *Deleted

## 2019-04-05 LAB — SURGICAL PATHOLOGY

## 2019-04-05 NOTE — Telephone Encounter (Signed)
Received order for oncotype testing. Requisition faxed to pathology and [facility] °

## 2019-04-17 ENCOUNTER — Telehealth: Payer: Self-pay | Admitting: *Deleted

## 2019-04-17 NOTE — Telephone Encounter (Signed)
Left vm informing she will be scheduled for rad onc once oncotype testing results are back. Contact information provided.

## 2019-04-18 ENCOUNTER — Telehealth: Payer: Self-pay

## 2019-04-18 NOTE — Telephone Encounter (Signed)
Nutrition Assessment  Reason for Assessment:  Pt attended Breast Clinic on 03/21/2019 and was given nutrition packet but nurse navigator  ASSESSMENT:  73 year old female with breast cancer.  S/p lumpectomy and oncotype.  Planning radiation and antiestrogens.  Past medical history reviewed.  Spoke with patient via phone to introduce self and service at [facility].  Patient reports that she has a good appetite.    Medications:  reviewed  Labs: reviewed  Anthropometrics:   Height: 65 inches Weight: 110 lb BMI: 18   NUTRITION DIAGNOSIS: Food and nutrition related knowledge deficit related to new diagnosis of breast cancer as evidenced by no prior need for nutrition related information.  INTERVENTION:   Discussed briefly packet of information regarding nutritional tips for breast cancer patients.  Patient with no questions at this time.   Contact information provided and patient knows to contact me with questions/concerns.    MONITORING, EVALUATION, and GOAL: Pt will consume a healthy plant based diet to maintain lean body mass throughout treatment.   Iveliz Garay B. Zenia Resides, [place], [place] Registered Dietitian [phone] (pager)

## 2019-04-19 ENCOUNTER — Telehealth: Payer: Self-pay | Admitting: *Deleted

## 2019-04-19 NOTE — Telephone Encounter (Signed)
Spoke to pt regarding next steps. Discussed oncotype testing followed by xrt and AI. Received verbal understanding. Denies further questions or needs at this time.

## 2019-04-20 ENCOUNTER — Telehealth: Payer: Self-pay | Admitting: *Deleted

## 2019-04-20 DIAGNOSIS — C50212 Malignant neoplasm of upper-inner quadrant of left female breast: Secondary | ICD-10-CM

## 2019-04-20 DIAGNOSIS — Z170 Estrogen receptor positive status [ER+]: Secondary | ICD-10-CM

## 2019-04-20 NOTE — Telephone Encounter (Signed)
Received oncotype score of 21/7%. Physician team notified. Called pt with results and discussed chemo not recommended. Informed next step is xrt with Dr. Lisbeth Renshaw. Received verbal understanding.

## 2019-04-25 ENCOUNTER — Encounter: Payer: Self-pay | Admitting: Radiation Oncology

## 2019-04-25 ENCOUNTER — Ambulatory Visit
Admission: RE | Admit: 2019-04-25 | Discharge: 2019-04-25 | Disposition: A | Discharge status: Home / Self Care | Payer: Medicare Other | Source: Ambulatory Visit | Attending: Radiation Oncology | Admitting: Radiation Oncology

## 2019-04-25 ENCOUNTER — Other Ambulatory Visit: Payer: Self-pay

## 2019-04-25 VITALS — Ht 65.0 in | Wt 114.0 lb

## 2019-04-25 DIAGNOSIS — C50212 Malignant neoplasm of upper-inner quadrant of left female breast: Secondary | ICD-10-CM

## 2019-04-25 DIAGNOSIS — C50511 Malignant neoplasm of lower-outer quadrant of right female breast: Secondary | ICD-10-CM

## 2019-04-25 HISTORY — DX: Malignant neoplasm of unspecified site of unspecified female breast: C50.919

## 2019-04-25 NOTE — Progress Notes (Signed)
See progress note under physician encounter. 

## 2019-04-25 NOTE — Progress Notes (Signed)
Radiation Oncology         [phone] ________________________________  Outpatient Follow Up - Conducted via telephone due to current COVID-19 concerns for limiting patient exposure  I spoke with the patient to conduct this consult visit via telephone to spare the patient unnecessary potential exposure in the healthcare setting during the current COVID-19 pandemic. The patient was notified in advance and was offered a [place] meeting to allow for face to face communication but unfortunately reported that they did not have the appropriate resources/technology to support such a visit and instead preferred to proceed with a telephone visit. ________________________________  Name: Kayla Randolph        MRN: [id number]  Date of Service: 04/25/2019 DOB: 10/11/1945  [id number], Kayla Distance, DO  Kayla, Virgie Dad, MD     REFERRING PHYSICIAN: Magrinat, Virgie Dad, MD   DIAGNOSIS: The primary encounter diagnosis was Malignant neoplasm of lower-outer quadrant of right female breast, unspecified estrogen receptor status ([place]). A diagnosis of Malignant neoplasm of upper-inner quadrant of left breast in female, estrogen receptor positive ([place]) was also pertinent to this visit.   HISTORY OF PRESENT ILLNESS: Kayla Randolph is a 73 y.o. female seen in the multidisciplinary breast clinic for a new diagnosis of left breast cancer. The patient was noted to have screening detected calcifications and a mass. Diagnostic imaging revealed a 1.1 x 1 x 1 cm mass at 3:30 and associated calcifications. Her axilla was negative for adenopathy a biopsy on 03/13/2019 revealed a grade 2 invasive ductal carcinoma that was ER/PR positive, HER2 negative and her Ki 67 was 30%. She underwent lumpectomy and SNL biopsy on 04/03/2019 and this revealed a 2.2 cm, grade 2 invasive ductal carcinoma with negative margins and two sampled nodes. Oncotype was 21 and she is contacted by phone to discuss treatment.    PREVIOUS RADIATION THERAPY:  No   PAST MEDICAL HISTORY:  Past Medical History:  Diagnosis Date   Allergy    Anxiety    Breast cancer ([place])    Depression    History of colon polyps    Hyperlipidemia    Hypothyroidism    Interstitial cystitis    History of   Osteopenia    Vitamin D deficiency        PAST SURGICAL HISTORY: Past Surgical History:  Procedure Laterality Date   BREAST LUMPECTOMY WITH RADIOACTIVE SEED AND SENTINEL LYMPH NODE BIOPSY Left 04/03/2019   Procedure: LEFT BREAST LUMPECTOMY WITH RADIOACTIVE SEED AND SENTINEL LYMPH NODE MAPPING;  Surgeon: Erroll Luna, MD;  Location: [facility];  Service: General;  Laterality: Left;   CESAREAN SECTION     EYE SURGERY Right    cataract   TONSILLECTOMY       FAMILY HISTORY:  Family History  Problem Relation Age of Onset   Heart attack Father    Congestive Heart Failure Father    Osteoporosis Maternal Aunt    Uterine cancer Maternal Aunt    Cancer Mother        possible lung - spread    Early death Mother    Stroke Maternal Grandmother    Breast cancer Paternal Grandmother    Stroke Paternal Grandfather      SOCIAL HISTORY:  reports that she has never smoked. She has never used smokeless tobacco. She reports current alcohol use of about 7.0 standard drinks of alcohol per week. She reports that she does not use drugs. The patient is married and lives in [place].  ALLERGIES: Macrodantin, Sulfa antibiotics, and Wellbutrin [bupropion hcl]   MEDICATIONS:  Current Outpatient Medications  Medication Sig Dispense Refill   Black Pepper-Turmeric (TURMERIC COMPLEX/BLACK PEPPER PO) Take by mouth.     Cholecalciferol (VITAMIN D3) 1000 UNITS CAPS Take 2 capsules by mouth daily.      cycloSPORINE (RESTASIS) 0.05 % ophthalmic emulsion Place 1 drop into both eyes 2 (two) times daily.     escitalopram (LEXAPRO) 10 MG tablet TAKE (1) TABLET DAILY AS DIRECTED. 90 tablet 3   estradiol (ESTRACE) 0.1 MG/GM  vaginal cream INSERT 1 VAGINALLY AT BEDTIME 127.5 g 1   fluticasone (FLONASE) 50 MCG/ACT nasal spray 1 SPRAY IN EACH NOSTRIL EVERY 12 HOURS 48 g 3   levothyroxine (SYNTHROID, LEVOTHROID) 25 MCG tablet Take 1 tablet (25 mcg total) by mouth daily before breakfast. 90 tablet 3   Multiple Vitamins-Minerals (WOMENS 50+ MULTI VITAMIN/MIN PO) Take by mouth.     Omega-3 Fatty Acids (SUPER OMEGA-3 PO) Take by mouth.     Polyethylene Glycol 3350 (MIRALAX PO) Take 1 packet by mouth daily as needed.       rosuvastatin (CRESTOR) 5 MG tablet TAKE (1) TABLET DAILY AS DIRECTED. 90 tablet 3   Specialty Vitamins Products (ONE-A-DAY BONE STRENGTH) 500-28-100 MG-MG-UNIT TABS Take 1 tablet by mouth 3 (three) times daily.     UNABLE TO FIND Med Name: Prelief Bladder     No current facility-administered medications for this encounter.      REVIEW OF SYSTEMS: On review of systems, the patient reports that she is doing well overall. She has some tightening of the LUE under her axilla and a cord she can feel. She also has some redness in the breast area. She denies any chest pain, shortness of breath, cough, fevers, chills, night sweats, unintended weight changes. She denies any bowel or bladder disturbances, and denies abdominal pain, nausea or vomiting. She denies any new musculoskeletal or joint aches or pains. A complete review of systems is obtained and is otherwise negative.    PHYSICAL EXAM:  Wt Readings from Last 3 Encounters:  04/25/19 114 lb (51.7 kg)  04/03/19 113 lb 15.7 oz (51.7 kg)  03/21/19 110 lb 4.8 oz (50 kg)   Temp Readings from Last 3 Encounters:  04/03/19 97.7 F (36.5 C) (Oral)  03/21/19 98.7 F (37.1 C) (Temporal)  03/01/19 98.6 F (37 C) (Temporal)   BP Readings from Last 3 Encounters:  04/03/19 (!) 164/89  03/21/19 (!) 164/71  03/01/19 108/60   Pulse Readings from Last 3 Encounters:  04/03/19 85  03/21/19 71  03/01/19 65   In general this is a well appearing caucasian  female in no acute distress. She's alert and oriented x4 and appropriate throughout the examination. Cardiopulmonary assessment is negative for acute distress and she exhibits normal effort. Her breast is healing well without cellulitic appearing change, but her capillaries appear engorged along the incision line.   ECOG = 0  0 - Asymptomatic (Fully active, able to carry on all predisease activities without restriction)  1 - Symptomatic but completely ambulatory (Restricted in physically strenuous activity but ambulatory and able to carry out work of a light or sedentary nature. For example, light housework, office work)  2 - Symptomatic, <50% in bed during the day (Ambulatory and capable of all self care but unable to carry out any work activities. Up and about more than 50% of waking hours)  3 - Symptomatic, >50% in bed, but not bedbound (Capable of only limited  self-care, confined to bed or chair 50% or more of waking hours)  4 - Bedbound (Completely disabled. Cannot carry on any self-care. Totally confined to bed or chair)  5 - Death   Eustace Pen MM, Creech RH, Tormey DC, et al. [phone]). "Toxicity and response criteria of the [facility]". [place] Oncol. 5 (6): 649-55    LABORATORY DATA:  Lab Results  Component Value Date   WBC 5.8 03/21/2019   HGB 14.3 03/21/2019   HCT 44.9 03/21/2019   MCV 95.9 03/21/2019   PLT 172 03/21/2019   Lab Results  Component Value Date   NA 141 03/21/2019   K 4.5 03/21/2019   CL 102 03/21/2019   CO2 29 03/21/2019   Lab Results  Component Value Date   ALT 16 03/21/2019   AST 23 03/21/2019   ALKPHOS 46 03/21/2019   BILITOT 0.5 03/21/2019      RADIOGRAPHY: Nm Sentinel Node Inj-no Rpt (breast)  Result Date: 04/03/2019 Sulfur colloid was injected by the nuclear medicine technologist for melanoma sentinel node.   Mm Breast Surgical Specimen  Result Date: 04/03/2019 CLINICAL DATA:  Left lumpectomy for recently  diagnosed invasive ductal carcinoma. EXAM: SPECIMEN RADIOGRAPH OF THE LEFT BREAST COMPARISON:  Previous exam(s). FINDINGS: Status post excision of the left breast. The radioactive seed and ribbon shaped biopsy marker clip are present, completely intact. IMPRESSION: Specimen radiograph of the left breast. Electronically Signed   By: Claudie Revering M.D.   On: 04/03/2019 12:14   [place] Radioactive Seed Loc  Result Date: 04/02/2019 CLINICAL DATA:  Patient for preoperative localization prior to left lumpectomy. EXAM: ULTRASOUND GUIDED RADIOACTIVE SEED LOCALIZATION OF THE LEFT BREAST COMPARISON:  Previous exam(s). FINDINGS: Patient presents for radioactive seed localization prior to left lumpectomy. I met with the patient and we discussed the procedure of seed localization including benefits and alternatives. We discussed the high likelihood of a successful procedure. We discussed the risks of the procedure including infection, bleeding, tissue injury and further surgery. We discussed the low dose of radioactivity involved in the procedure. Informed, written consent was given. The usual time-out protocol was performed immediately prior to the procedure. Using ultrasound guidance, sterile technique, 1% lidocaine and an I-125 radioactive seed, mass and biopsy marking clip within the lateral left breast was localized using a lateral approach. The follow-up mammogram images confirm the seed in the expected location and were marked for Dr. Brantley Stage. Follow-up survey of the patient confirms presence of the radioactive seed. Order number of I-125 seed:  [id number]. Total activity:  3.086 millicuries reference Date: 03/12/2019 The patient tolerated the procedure well and was released from the [facility]. She was given instructions regarding seed removal. IMPRESSION: Radioactive seed localization left breast. No apparent complications. Electronically Signed   By: Lovey Newcomer M.D.   On: 04/02/2019 15:14   Mm Clip Placement  Left  Result Date: 04/02/2019 CLINICAL DATA:  Patient for preoperative localization prior to left lumpectomy. EXAM: DIAGNOSTIC LEFT MAMMOGRAM POST ULTRASOUND-GUIDED RADIOACTIVE SEED PLACEMENT COMPARISON:  Previous exam(s). FINDINGS: Mammographic images were obtained following ultrasound-guided radioactive seed placement. These demonstrate radioactive seed adjacent to the ribbon clip within the outer left breast. IMPRESSION: Appropriate location of the radioactive seed. Final Assessment: Post Procedure Mammograms for Seed Placement Electronically Signed   By: Lovey Newcomer M.D.   On: 04/02/2019 15:15       IMPRESSION/PLAN: 1. 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 Dr. Lisbeth Renshaw discusses  the final pathology findings and reviews the nature of left breast disease. She does not need systemic therapy but would benefit from external radiotherapy to the breast followed by antiestrogen therapy. She is interested in being aggressive about her treatment. We discussed the risks, benefits, short, and long term effects of radiotherapy, and the patient is interested in proceeding. Dr. Lisbeth Renshaw discusses the delivery and logistics of radiotherapy and recommends a course of 4  weeks of radiotherapy to the left breast with deep inspiration breath hold technique.She will come in on Friday for simulation at which time she will sign written consent.  2. Postop erythema. The skin looks as though it's healing well but does not appear to be cellulitic in nature, rather her capillaries appear prominent but we will follow this expectantly.   This encounter was provided by telemedicine platform [employer] after converting from a telephone visit.  The patient has given verbal consent for this type of encounter and has been advised to only accept a meeting of this type in a secure network environment. The time spent during this encounter was 45 minutes. The attendants for this meeting include   Dr. Lisbeth Renshaw, Hayden Pedro  and Elon Spanner.  During the encounter, Dr. Lisbeth Renshaw, and Hayden Pedro were located at [facility] Radiation Oncology Department.  ARAEYA LAMB was located at home.    The above documentation reflects my direct findings during this shared patient visit. Please see the separate note by Dr. Lisbeth Renshaw on this date for the remainder of the patient's plan of care.    Carola Rhine, PAC

## 2019-04-25 NOTE — Progress Notes (Signed)
Patient denies pain. Patient reports tenderness in left breast s/p lumpectomy. Reports incisions are well approximated without redness, drainage or fever. Explains that immediately following surgery she had full ROM but now she is "a little stiff." Patient scheduled in the lymphedema clinic on 11/21. Patient scheduled for [facility] 12/11. Endorses using estrace vaginal cream. Oncotype 21 thus no chemo recommended. Retired Education officer, [employer].

## 2019-04-26 ENCOUNTER — Encounter: Payer: Self-pay | Admitting: Oncology

## 2019-04-26 DIAGNOSIS — C44311 Basal cell carcinoma of skin of nose: Secondary | ICD-10-CM | POA: Diagnosis not present

## 2019-04-27 ENCOUNTER — Ambulatory Visit
Admission: RE | Admit: 2019-04-27 | Discharge: 2019-04-27 | Disposition: A | Discharge status: Home / Self Care | Payer: Medicare Other | Source: Ambulatory Visit | Attending: Radiation Oncology | Admitting: Radiation Oncology

## 2019-04-27 ENCOUNTER — Encounter (HOSPITAL_COMMUNITY): Payer: Self-pay | Admitting: Oncology

## 2019-04-27 ENCOUNTER — Other Ambulatory Visit: Payer: Self-pay

## 2019-04-27 DIAGNOSIS — Z510 Encounter for antineoplastic radiation therapy: Secondary | ICD-10-CM | POA: Diagnosis present

## 2019-04-27 DIAGNOSIS — C50212 Malignant neoplasm of upper-inner quadrant of left female breast: Secondary | ICD-10-CM | POA: Diagnosis present

## 2019-04-27 DIAGNOSIS — Z170 Estrogen receptor positive status [ER+]: Secondary | ICD-10-CM | POA: Insufficient documentation

## 2019-05-01 NOTE — Progress Notes (Signed)
  Radiation Oncology         [phone] ________________________________  Name: VENOLA FELLING MRN: [id number]  Date: 04/27/2019  DOB: 05-08-1946   DIAGNOSIS:     ICD-10-CM   1. Malignant neoplasm of upper-inner quadrant of left breast in female, estrogen receptor positive ([place])  C50.212    Z17.0     SIMULATION AND TREATMENT PLANNING NOTE  The patient presented for simulation prior to beginning her course of radiation treatment for her diagnosis of left-sided breast cancer. The patient was placed in a supine position on a breast board. A customized vac-lock bag was constructed and this complex treatment device will be used on a daily basis during her treatment. In this fashion, a CT scan was obtained through the chest area and an isocenter was placed near the chest wall within the breast.  The patient will be planned to receive a course of radiation initially to a dose of 42.56 Gy. This will consist of a whole breast radiotherapy technique. To accomplish this, 2 customized blocks have been designed which will correspond to medial and lateral whole breast tangent fields. This treatment will be accomplished at 2.66 Gy per fraction. A forward planning technique will also be evaluated to determine if this approach improves the plan. It is anticipated that the patient will then receive a 8 Gy boost to the seroma cavity which has been contoured. This will be accomplished at 2 Gy per fraction.   This initial treatment will consist of a 3-D conformal technique. The seroma has been contoured as the primary target structure. Additionally, dose volume histograms of both this target as well as the lungs and heart will also be evaluated. Such an approach is necessary to ensure that the target area is adequately covered while the nearby critical  normal structures are adequately spared.  Plan:  The final anticipated total dose therefore will correspond to 50.56 Gy.   Special treatment procedure was  performed today due to the extra time and effort required by myself to plan and prepare this patient for deep inspiration breath hold technique.  I have determined cardiac sparing to be of benefit to this patient to prevent long term cardiac damage due to radiation of the heart.  Bellows were placed on the patient's abdomen. To facilitate cardiac sparing, the patient was coached by the radiation therapists on breath hold techniques and breathing practice was performed. Practice waveforms were obtained. The patient was then scanned while maintaining breath hold in the treatment position.  This image was then transferred over to the imaging specialist. The imaging specialist then created a fusion of the free breathing and breath hold scans using the chest wall as the stable structure. I personally reviewed the fusion in axial, coronal and sagittal image planes.  Excellent cardiac sparing was obtained.  I felt the patient is an appropriate candidate for breath hold and the patient will be treated as such.  The image fusion was then reviewed with the patient to reinforce the necessity of reproducible breath hold.    _______________________________   Jodelle Gross, MD, PhD

## 2019-05-01 NOTE — Progress Notes (Signed)
  Radiation Oncology         [phone] ________________________________  Name: Kayla Randolph MRN: [id number]  Date: 04/27/2019  DOB: 1946-01-18  Optical Surface Tracking Plan:  Since intensity modulated radiotherapy (IMRT) and 3D conformal radiation treatment methods are predicated on accurate and precise positioning for treatment, intrafraction motion monitoring is medically necessary to ensure accurate and safe treatment delivery.  The ability to quantify intrafraction motion without excessive ionizing radiation dose can only be performed with optical surface tracking. Accordingly, surface imaging offers the opportunity to obtain 3D measurements of patient position throughout IMRT and 3D treatments without excessive radiation exposure.  I am ordering optical surface tracking for this patient's upcoming course of radiotherapy. ________________________________  Kyung Rudd, MD 05/01/2019 10:43 AM    Reference:   Ursula Alert, J, et al. Surface imaging-based analysis of intrafraction motion for breast radiotherapy patients.Journal of Long, n. 6, nov. 2014. ISSN [id number].   Available at: <http://www.jacmp.org/index.php/jacmp/article/view/4957>.

## 2019-05-02 ENCOUNTER — Telehealth: Payer: Self-pay | Admitting: Radiation Oncology

## 2019-05-02 NOTE — Telephone Encounter (Signed)
I called the patient to let her know her treatment times since she had questions and she will ask for her schedule to be reprinted when she comes to start treatment on 05/14/2019.

## 2019-05-04 DIAGNOSIS — Z510 Encounter for antineoplastic radiation therapy: Secondary | ICD-10-CM | POA: Diagnosis not present

## 2019-05-07 ENCOUNTER — Ambulatory Visit: Payer: Medicare Other | Attending: Surgery | Admitting: Physical Therapy

## 2019-05-07 ENCOUNTER — Other Ambulatory Visit: Payer: Self-pay

## 2019-05-07 ENCOUNTER — Encounter: Payer: Self-pay | Admitting: Physical Therapy

## 2019-05-07 ENCOUNTER — Ambulatory Visit: Payer: Medicare Other | Admitting: Radiation Oncology

## 2019-05-07 DIAGNOSIS — Z483 Aftercare following surgery for neoplasm: Secondary | ICD-10-CM | POA: Diagnosis present

## 2019-05-07 DIAGNOSIS — C50512 Malignant neoplasm of lower-outer quadrant of left female breast: Secondary | ICD-10-CM | POA: Diagnosis present

## 2019-05-07 DIAGNOSIS — R293 Abnormal posture: Secondary | ICD-10-CM | POA: Insufficient documentation

## 2019-05-07 DIAGNOSIS — Z170 Estrogen receptor positive status [ER+]: Secondary | ICD-10-CM | POA: Diagnosis present

## 2019-05-07 DIAGNOSIS — M25612 Stiffness of left shoulder, not elsewhere classified: Secondary | ICD-10-CM | POA: Diagnosis present

## 2019-05-07 DIAGNOSIS — I890 Lymphedema, not elsewhere classified: Secondary | ICD-10-CM | POA: Insufficient documentation

## 2019-05-07 NOTE — Therapy (Signed)
[facility] [address] [place], [place], [address] Phone: [phone]   Fax:  [phone]  Physical Therapy Treatment  Patient Details  Name: Kayla Randolph MRN: [id number] Date of Birth: 07/04/1945 Referring Provider (PT): Dr. Thomas Cornett   Encounter Date: 05/07/2019  PT End of Session - 05/07/19 1241    Visit Number  2    Number of Visits  10    Date for PT Re-Evaluation  06/04/19    PT Start Time  1103    PT Stop Time  1210    PT Time Calculation (min)  67 min    Activity Tolerance  Patient tolerated treatment well    Behavior During Therapy  WFL for tasks assessed/performed       Past Medical History:  Diagnosis Date  . Allergy   . Anxiety   . Breast cancer (HCC)   . Depression   . History of colon polyps   . Hyperlipidemia   . Hypothyroidism   . Interstitial cystitis    History of  . Osteopenia   . Vitamin D deficiency     Past Surgical History:  Procedure Laterality Date  . BREAST LUMPECTOMY WITH RADIOACTIVE SEED AND SENTINEL LYMPH NODE BIOPSY Left 04/03/2019   Procedure: LEFT BREAST LUMPECTOMY WITH RADIOACTIVE SEED AND SENTINEL LYMPH NODE MAPPING;  Surgeon: Cornett, Thomas, MD;  Location: [facility];  Service: General;  Laterality: Left;  . CESAREAN SECTION    . EYE SURGERY Right    cataract  . TONSILLECTOMY      There were no vitals filed for this visit.  Subjective Assessment - 05/07/19 1110    Subjective  Patient underwent a left lumpectomy and sentinel node biopsy (2 negative nodes) on 04/03/2019. Oncotype score was 21 so she does not need chemotherapy but begins radiation on 05/14/2019. She reports her left forearm and hand began swelling a few days ago.    Pertinent History  Patient was diagnosed on 03/15/2019 with left invasive ductal carcinoma breast cancer. Patient underwent a left lumpectomy and sentinel node biopsy (2 negative nodes) on 04/03/2019. It is ER/PR positive  and HER2 negative with a Ki67 of 30%.    Patient Stated Goals  See if my arm is doing ok    Currently in Pain?  Yes    Pain Score  1     Pain Location  Arm    Pain Orientation  Left;Medial;Upper    Pain Descriptors / Indicators  Tightness    Pain Type  Neuropathic pain    Pain Onset  More than a month ago    Pain Frequency  Intermittent    Aggravating Factors   Touching it    Pain Relieving Factors  Unknown    Multiple Pain Sites  No         [facility] PT Assessment - 05/07/19 0001      Assessment   Medical Diagnosis  s/p left lumpectomy and SLNB    Referring Provider (PT)  Dr. Thomas Cornett    Onset Date/Surgical Date  04/03/19    Hand Dominance  Right    Prior Therapy  Baselines      Precautions   Precautions  Other (comment)    Precaution Comments  recent surgery and left arm lymphedema risk      Restrictions   Weight Bearing Restrictions  No      Balance Screen   Has the patient fallen in the past 6 months  No      Has the patient had a decrease in activity level because of a fear of falling?   No    Is the patient reluctant to leave their home because of a fear of falling?   No      Home Environment   Living Environment  Private residence    Living Arrangements  Spouse/significant other    Available Help at Discharge  Family      Prior Function   Level of Independence  Independent    Vocation  Retired    Leisure  She is not exercising      Cognition   Overall Cognitive Status  Within Functional Limits for tasks assessed      Observation/Other Assessments   Observations  Axillary incision appears to be very mobile and well healed. Inferior breast incision is adhered down and appears to be limiting lymphatic flow. She has some fibrosis present with edema in her inferior breast. Axillary cording present from axilla to medial mid upper arm.      Posture/Postural Control   Posture/Postural Control  Postural limitations    Postural Limitations  Rounded  Shoulders;Forward head   Has some cervical/thoracic scoliosis     ROM / Strength   AROM / PROM / Strength  AROM      AROM   AROM Assessment Site  Shoulder    Right/Left Shoulder  Left    Left Shoulder Extension  50 Degrees    Left Shoulder Flexion  127 Degrees    Left Shoulder ABduction  131 Degrees    Left Shoulder Internal Rotation  76 Degrees    Left Shoulder External Rotation  90 Degrees        LYMPHEDEMA/ONCOLOGY QUESTIONNAIRE - 05/07/19 1116      Type   Cancer Type  Left breast cancer      Surgeries   Lumpectomy Date  04/03/19    Sentinel Lymph Node Biopsy Date  04/03/19    Number Lymph Nodes Removed  2      Treatment   Active Chemotherapy Treatment  No    Past Chemotherapy Treatment  No    Active Radiation Treatment  No    Past Radiation Treatment  No    Current Hormone Treatment  No    Past Hormone Therapy  No      What other symptoms do you have   Are you Having Heaviness or Tightness  Yes    Are you having Pain  Yes    Are you having pitting edema  No    Is it Hard or Difficult finding clothes that fit  No    Do you have infections  No    Is there Decreased scar mobility  No    Stemmer Sign  No      Lymphedema Assessments   Lymphedema Assessments  Upper extremities      Right Upper Extremity Lymphedema   10 cm Proximal to Olecranon Process  21.9 cm    Olecranon Process  20.7 cm    10 cm Proximal to Ulnar Styloid Process  17.5 cm    Just Proximal to Ulnar Styloid Process  13.9 cm    Across Hand at Thumb Web Space  17.2 cm    At Base of 2nd Digit  5.6 cm      Left Upper Extremity Lymphedema   10 cm Proximal to Olecranon Process  21.7 cm    Olecranon Process  21.1 cm    10 cm Proximal to Ulnar   Styloid Process  18.5 cm    Just Proximal to Ulnar Styloid Process  13.9 cm    Across Hand at Thumb Web Space  16.7 cm    At Base of 2nd Digit  5.4 cm        Quick Dash - 05/07/19 0001    Open a tight or new jar  Moderate difficulty    Do heavy  household chores (wash walls, wash floors)  No difficulty    Carry a shopping bag or briefcase  No difficulty    Wash your back  Mild difficulty    Use a knife to cut food  No difficulty    Recreational activities in which you take some force or impact through your arm, shoulder, or hand (golf, hammering, tennis)  Unable    During the past week, to what extent has your arm, shoulder or hand problem interfered with your normal social activities with family, friends, neighbors, or groups?  Not at all    During the past week, to what extent has your arm, shoulder or hand problem limited your work or other regular daily activities  Not at all    Arm, shoulder, or hand pain.  Mild    Tingling (pins and needles) in your arm, shoulder, or hand  None    Difficulty Sleeping  No difficulty    DASH Score  18.18 %                     PT Education - 05/07/19 1240    Education Details  Abduction and flexion stretches closed chain and neural stretch for cording; how to do scar massage; how to obtain a compression sleeve    Person(s) Educated  Patient    Methods  Explanation;Demonstration;Handout    Comprehension  Verbalized understanding;Returned demonstration          PT Long Term Goals - 05/07/19 1246      PT LONG TERM GOAL #1   Title  Patient will demonstrate she has regained full shoulder ROM and function post operatively compared to baseline assessment.    Time  4    Period  Weeks    Status  On-going    Target Date  06/04/19      PT LONG TERM GOAL #2   Title  Patient will improve DASH score to 0 (zero) for improved overall arm function and to return to baseline score.    Baseline  18.18 post op; 0 at baseline    Time  4    Period  Weeks    Status  New    Target Date  06/04/19      PT LONG TERM GOAL #3   Title  Patient will improve left shoulder active flexion to >/= 144 degrees for increased ease reaching overhead and to return to baseline.    Baseline  127 post op;  144 at baseline    Time  4    Period  Weeks    Target Date  06/04/19      PT LONG TERM GOAL #4   Title  Patient will improve left shoulder active abduction to >/= 160 degrees for increased ease reaching overhead and to return to baseline.    Baseline  131 degrees post op; 161 at baseline    Time  4    Period  Weeks    Status  New    Target Date  06/04/19      PT LONG TERM   GOAL #5   Title  Patient will verbalize good understanding of lymphedema risk reduction practices.    Time  4    Period  Weeks    Status  New    Target Date  06/04/19            Plan - 05/07/19 1242    Clinical Impression Statement  Patient reports she is feeling well s/p left lumpectomy and sentinel node biopsy. She had 2 negative nodes removed and an Oncotype score of 21. She is lacking shoulder flexion and abduction ROM compared to baselines, has some breast edema which appears to be limited by scar tissue present at her inferior breast incision, has some cording in her left upper arm, and mild edema present in her left forearm. Shw does not need chemotherapy but will begin radiation on 05/14/2019. She will benefit from PT to address her deficits and prevent lymphedema in her left arm.    Rehab Potential  Excellent    PT Frequency  2x / week    PT Duration  4 weeks    PT Treatment/Interventions  ADLs/Self Care Home Management;Patient/family education;Therapeutic exercise;Passive range of motion;Scar mobilization;Manual techniques;Manual lymph drainage;Therapeutic activities    PT Next Visit Plan  Begin PROM left shoulder, manual lymph drainage for left arm focused on her forearm, and scar massage for left inferior breast. See if she was able to get a compression garment; recommended [employer] [facility] off the shelf 20-30 mmHg    PT Home Exercise Plan  Post op shoulder ROM HEP and neural stretching    Consulted and Agree with Plan of Care  Patient       Patient will benefit from skilled therapeutic intervention  in order to improve the following deficits and impairments:  Postural dysfunction, Decreased range of motion, Impaired UE functional use, Pain, Decreased knowledge of precautions, Increased edema, Decreased scar mobility  Visit Diagnosis: Malignant neoplasm of lower-outer quadrant of left breast of female, estrogen receptor positive ([place]) - Plan: PT plan of care cert/re-cert  Abnormal posture - Plan: PT plan of care cert/re-cert  Aftercare following surgery for neoplasm - Plan: PT plan of care cert/re-cert  Stiffness of left shoulder, not elsewhere classified - Plan: PT plan of care cert/re-cert  Lymphedema, not elsewhere classified - Plan: PT plan of care cert/re-cert     Problem List Patient Active Problem List   Diagnosis Date Noted  . Malignant neoplasm of upper-inner quadrant of left breast in female, estrogen receptor positive ([place]) 03/16/2019  . Primary osteoarthritis involving multiple joints 10/04/2017  . Hypothyroidism due to acquired atrophy of thyroid 05/31/2017  . Depression 04/03/2013  . Hyperlipemia 12/14/2012  . Vitamin D deficiency 12/14/2012  . Allergic rhinitis 12/14/2012  . 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 Osteopenia     Annia Friendly, [place] 05/07/19 12:51 PM   [place], [place], [address] Phone: [phone]   Fax:  [phone]  Name: Kayla Randolph MRN: [id number] Date of Birth: [birth year]

## 2019-05-07 NOTE — Patient Instructions (Signed)
Closed Chain: Shoulder Flexion / Extension - on Wall    Hands on wall, step backward. Return. Stepping causes shoulder flexion and extension Do _5__ times, holding 5 seconds, _2__ times per day.  http://ss.exer.us/265   Copyright  VHI. All rights reserved.  Closed Chain: Shoulder Abduction / Adduction - on Wall    One hand on wall, step to side and return. Stepping causes shoulder to abduct and adduct. Do _5__ times, holding 5 seconds, _2__ times per day. http://ss.exer.us/267   Copyright  VHI. All rights reserved.  MEDIAN NERVE: Mobilization XI    Stand with right palm flat on wall, fingers back, elbow bent, head tilted away. Sidestep away from wall, straightening elbow. Do _3__ sets of _5__ repetitions per session, holding 5 seconds. Do _2__ sessions per day.  Copyright  VHI. All rights reserved.

## 2019-05-08 ENCOUNTER — Ambulatory Visit: Payer: Medicare Other

## 2019-05-09 ENCOUNTER — Ambulatory Visit: Payer: Medicare Other

## 2019-05-09 ENCOUNTER — Other Ambulatory Visit: Payer: Self-pay

## 2019-05-09 DIAGNOSIS — R293 Abnormal posture: Secondary | ICD-10-CM

## 2019-05-09 DIAGNOSIS — C50512 Malignant neoplasm of lower-outer quadrant of left female breast: Secondary | ICD-10-CM

## 2019-05-09 DIAGNOSIS — Z483 Aftercare following surgery for neoplasm: Secondary | ICD-10-CM

## 2019-05-09 DIAGNOSIS — M25612 Stiffness of left shoulder, not elsewhere classified: Secondary | ICD-10-CM

## 2019-05-09 DIAGNOSIS — I890 Lymphedema, not elsewhere classified: Secondary | ICD-10-CM

## 2019-05-09 NOTE — Therapy (Signed)
[place], [place], [address] Phone: [phone]   Fax:  [phone]  Physical Therapy Treatment  Patient Details  Name: Kayla Randolph MRN: [id number] Date of Birth: [birth year] Referring Provider (PT): Dr. Erroll Luna   Encounter Date: 05/09/2019  PT End of Session - 05/09/19 1216    Visit Number  3    Number of Visits  10    Date for PT Re-Evaluation  06/04/19    PT Start Time  1108    PT Stop Time  1207    PT Time Calculation (min)  59 min    Activity Tolerance  Patient tolerated treatment well    Behavior During Therapy  [facility] for tasks assessed/performed       Past Medical History:  Diagnosis Date  . Allergy   . Anxiety   . Breast cancer ([place])   . Depression   . History of colon polyps   . Hyperlipidemia   . Hypothyroidism   . Interstitial cystitis    History of  . Osteopenia   . Vitamin D deficiency     Past Surgical History:  Procedure Laterality Date  . BREAST LUMPECTOMY WITH RADIOACTIVE SEED AND SENTINEL LYMPH NODE BIOPSY Left 04/03/2019   Procedure: LEFT BREAST LUMPECTOMY WITH RADIOACTIVE SEED AND SENTINEL LYMPH NODE MAPPING;  Surgeon: Erroll Luna, MD;  Location: [facility];  Service: General;  Laterality: Left;  . CESAREAN SECTION    . EYE SURGERY Right    cataract  . TONSILLECTOMY      There were no vitals filed for this visit.  Subjective Assessment - 05/09/19 1113    Subjective  Nothing new since I was here for my evaluation except I did the HEP she gave me and they went fine. I did wake up to some swelling at my forearm from my fitbit this morning so I've loosened that.    Pertinent History  Patient was diagnosed on 03/15/2019 with left invasive ductal carcinoma breast cancer. Patient underwent a left lumpectomy and sentinel node biopsy (2 negative nodes) on 04/03/2019. It is ER/PR positive and HER2 negative with a Ki67 of 30%.    Patient Stated  Goals  See if my arm is doing ok    Currently in Pain?  No/denies                       [facility] Adult PT Treatment/Exercise - 05/09/19 0001      Manual Therapy   Manual Therapy  Manual Lymphatic Drainage (MLD);Myofascial release;Passive ROM    Manual therapy comments  Made and issued 2 compression foam options: 1/2" in thick stockinette which pt wore today, and a chip pack for her to try sleeping in with compression bra in thin stockinette.    Myofascial Release  To Lt axilla and antecubital fossa at areas of cording, no pops were noted but definite increased in P/ROM by end of session as cords had relaxed some    Manual Lymphatic Drainage (MLD)  In Supine: Short neck, 5 diaphragmatic breaths, Rt axillary and Lt inguinal nodes, anterior inter-axillary and Lt axillo-inguinal anastomosis, then Lt breast and UE focusing on forearm, then into Rt S/L for focus to inferior and lateral breast at area of fibrosis/scar tissue here directing along Lt axillo-inguinal anastomosis, then back to supine retracing all steps beginning to instruct pt throughout    Passive ROM  To Lt shoulder into flexion, abduction and D2 to tolerance  PT Long Term Goals - 05/07/19 1246      PT LONG TERM GOAL #1   Title  Patient will demonstrate she has regained full shoulder ROM and function post operatively compared to baseline assessment.    Time  4    Period  Weeks    Status  On-going    Target Date  06/04/19      PT LONG TERM GOAL #2   Title  Patient will improve DASH score to 0 (zero) for improved overall arm function and to return to baseline score.    Baseline  18.18 post op; 0 at baseline    Time  4    Period  Weeks    Status  New    Target Date  06/04/19      PT LONG TERM GOAL #3   Title  Patient will improve left shoulder active flexion to >/= 144 degrees for increased ease reaching overhead and to return to baseline.    Baseline  127 post op; 144 at baseline     Time  4    Period  Weeks    Target Date  06/04/19      PT LONG TERM GOAL #4   Title  Patient will improve left shoulder active abduction to >/= 160 degrees for increased ease reaching overhead and to return to baseline.    Baseline  131 degrees post op; 161 at baseline    Time  4    Period  Weeks    Status  New    Target Date  06/04/19      PT LONG TERM GOAL #5   Title  Patient will verbalize good understanding of lymphedema risk reduction practices.    Time  4    Period  Weeks    Status  New    Target Date  06/04/19            Plan - 05/09/19 1216    Clinical Impression Statement  First session of manual therapy today. Overall, pt tolerated all very well. Began educating her in basics of anatomy of lymphatic system and basic principles of manual lymph drainage while performing today. Some softening noted at lateral breast by end of session and cording was some imporved as being less visible and pt reports her arm feeling looser as well with A/ROM. Issued compression foam for her to try over the holiday and encouraged her to incorporate A/ROM stretches into her day (reaching to higher shelves, sliding arm up doorway for end ROM stretch) and to cont HEP inssued at last session. Pt verbalized understanding. Pt was going to [facility] after session today to try to get a compression sleeve.    Stability/Clinical Decision Making  Stable/Uncomplicated    Rehab Potential  Excellent    PT Frequency  2x / week    PT Duration  4 weeks    PT Treatment/Interventions  ADLs/Self Care Home Management;Patient/family education;Therapeutic exercise;Passive range of motion;Scar mobilization;Manual techniques;Manual lymph drainage;Therapeutic activities    PT Next Visit Plan  Cont PROM left shoulder, manual lymph drainage for left arm focused on her forearm and breast beginning to to instruct pt in this and issue handout, also scar massage for left inferior breast. See if she was able to get a  compression garment    PT Home Exercise Plan  Post op shoulder ROM HEP and neural stretching    Consulted and Agree with Plan of Care  Patient  Patient will benefit from skilled therapeutic intervention in order to improve the following deficits and impairments:  Postural dysfunction, Decreased range of motion, Impaired UE functional use, Pain, Decreased knowledge of precautions, Increased edema, Decreased scar mobility  Visit Diagnosis: Malignant neoplasm of lower-outer quadrant of left breast of female, estrogen receptor positive (HCC)  Abnormal posture  Aftercare following surgery for neoplasm  Stiffness of left shoulder, not elsewhere classified  Lymphedema, not elsewhere classified     Problem List Patient Active Problem List   Diagnosis Date Noted  . Malignant neoplasm of upper-inner quadrant of left breast in female, estrogen receptor positive ([place]) 03/16/2019  . Primary osteoarthritis involving multiple joints 10/04/2017  . Hypothyroidism due to acquired atrophy of thyroid 05/31/2017  . Depression 04/03/2013  . Hyperlipemia 12/14/2012  . Vitamin D deficiency 12/14/2012  . Allergic rhinitis 12/14/2012  . 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 Osteopenia     Otelia Limes, PTA 05/09/2019, 12:21 PM  [place], [place], [address] Phone: [phone]   Fax:  [phone]  Name: Kayla Randolph MRN: [id number] Date of Birth: [birth year]

## 2019-05-10 ENCOUNTER — Ambulatory Visit: Payer: Medicare Other

## 2019-05-14 ENCOUNTER — Ambulatory Visit
Admission: RE | Admit: 2019-05-14 | Discharge: 2019-05-14 | Disposition: A | Discharge status: Home / Self Care | Payer: Medicare Other | Source: Ambulatory Visit | Attending: Radiation Oncology | Admitting: Radiation Oncology

## 2019-05-14 ENCOUNTER — Ambulatory Visit: Payer: Medicare Other

## 2019-05-14 ENCOUNTER — Other Ambulatory Visit: Payer: Self-pay

## 2019-05-14 DIAGNOSIS — Z510 Encounter for antineoplastic radiation therapy: Secondary | ICD-10-CM | POA: Diagnosis not present

## 2019-05-15 ENCOUNTER — Ambulatory Visit
Admission: RE | Admit: 2019-05-15 | Discharge: 2019-05-15 | Disposition: A | Discharge status: Home / Self Care | Payer: Medicare Other | Source: Ambulatory Visit | Attending: Radiation Oncology | Admitting: Radiation Oncology

## 2019-05-15 ENCOUNTER — Other Ambulatory Visit: Payer: Self-pay

## 2019-05-15 ENCOUNTER — Ambulatory Visit: Payer: Medicare Other

## 2019-05-15 DIAGNOSIS — Z510 Encounter for antineoplastic radiation therapy: Secondary | ICD-10-CM | POA: Diagnosis not present

## 2019-05-16 ENCOUNTER — Ambulatory Visit: Payer: Medicare Other

## 2019-05-16 ENCOUNTER — Other Ambulatory Visit: Payer: Self-pay

## 2019-05-16 ENCOUNTER — Ambulatory Visit
Admission: RE | Admit: 2019-05-16 | Discharge: 2019-05-16 | Disposition: A | Discharge status: Home / Self Care | Payer: Medicare Other | Source: Ambulatory Visit | Attending: Radiation Oncology | Admitting: Radiation Oncology

## 2019-05-16 DIAGNOSIS — Z483 Aftercare following surgery for neoplasm: Secondary | ICD-10-CM

## 2019-05-16 DIAGNOSIS — I890 Lymphedema, not elsewhere classified: Secondary | ICD-10-CM

## 2019-05-16 DIAGNOSIS — Z170 Estrogen receptor positive status [ER+]: Secondary | ICD-10-CM

## 2019-05-16 DIAGNOSIS — C50512 Malignant neoplasm of lower-outer quadrant of left female breast: Secondary | ICD-10-CM

## 2019-05-16 DIAGNOSIS — Z510 Encounter for antineoplastic radiation therapy: Secondary | ICD-10-CM | POA: Diagnosis not present

## 2019-05-16 DIAGNOSIS — R293 Abnormal posture: Secondary | ICD-10-CM

## 2019-05-16 DIAGNOSIS — M25612 Stiffness of left shoulder, not elsewhere classified: Secondary | ICD-10-CM

## 2019-05-16 NOTE — Patient Instructions (Addendum)
Cancer Rehab [phone] [facility] yourself.  Do circles at your neck just above your collarbones.  Repeat this 10 times, can do one hand at a time Deep Effective Breath   Standing, sitting, or laying down, place both hands on the belly. Take a deep breath IN, expanding the belly; then breath OUT, contracting the belly. Repeat __5__ times. Do __2-3__ sessions per day and before your self massage.  Axilla to Axilla - Sweep   On uninvolved side make 5 circles in the armpit, then pump _5__ times from involved armpit across chest to uninvolved armpit, making a pathway. Do _1__ time per day.  Axilla to Inguinal Nodes - Sweep   On involved side, make 5 circles at groin at panty line, then pump _5__ times from armpit along side of trunk to outer hip (use Rt hand for top of pathway, Lt for lower), making your other pathway. Do __1_ time per day.   Breast Portion of Massage: Draw an imaginary diagonal line from upper outer breast through the nipple area toward lower inner breast.  Direct fluid upward and inward from this line toward the pathway across your upper chest .  Do all of the upper inner breast tissue.      Direct fluid to treat all of lower outer breast tissue downward and outward toward the waist pathway.Elizebeth Koller by doing the pathways as described above going from your involved armpit to the same side groin and going across your upper chest from the involved shoulder to the uninvolved shoulder.  Repeat the steps above where you do circles in your left groin and right armpit.  Can Stop Here If Only Doing Breast Massage:  Arm Posterior: Elbow to Shoulder - Sweep   Pump _5__ times from back of elbow to top of shoulder. Then inner to outer upper arm _5_ times, then outer arm again _5_ times. Then back to the pathways _2-3_ times. Do _1__ time per day.  ARM: [place] Wrist to Elbow - Sweep   Pump  _5__ times from wrist to elbow making sure to do both sides of the forearm. Then retrace  your steps to the outer arm, and the pathways _2-3_ times each. Do _1__ time per day.  ARM: Dorsum of Hand to Shoulder - Sweep   Pump or stationary circles _5__ times on back of hand including knuckle spaces and individual fingers if needed working up towards the wrist, then retrace all your steps working back up the forearm, doing both sides; upper outer arm and back to your pathways _2-3_ times each. Then do 5 circles again at uninvolved armpit and involved groin where you started! Good job!! Do __1_ time per day.

## 2019-05-16 NOTE — Therapy (Signed)
[place], [place], [address] Phone: [phone]   Fax:  [phone]  Physical Therapy Treatment  Patient Details  Name: Kayla Randolph MRN: [id number] Date of Birth: [birth year] Referring Provider (PT): Dr. Erroll Luna   Encounter Date: 05/16/2019  PT End of Session - 05/16/19 1138    Visit Number  4    Number of Visits  10    Date for PT Re-Evaluation  06/04/19    PT Start Time  9741   pt arrived late   PT Stop Time  1135    PT Time Calculation (min)  80 min    Activity Tolerance  Patient tolerated treatment well    Behavior During Therapy  [facility] for tasks assessed/performed       Past Medical History:  Diagnosis Date  . Allergy   . Anxiety   . Breast cancer ([place])   . Depression   . History of colon polyps   . Hyperlipidemia   . Hypothyroidism   . Interstitial cystitis    History of  . Osteopenia   . Vitamin D deficiency     Past Surgical History:  Procedure Laterality Date  . BREAST LUMPECTOMY WITH RADIOACTIVE SEED AND SENTINEL LYMPH NODE BIOPSY Left 04/03/2019   Procedure: LEFT BREAST LUMPECTOMY WITH RADIOACTIVE SEED AND SENTINEL LYMPH NODE MAPPING;  Surgeon: Erroll Luna, MD;  Location: [place];  Service: General;  Laterality: Left;  . CESAREAN SECTION    . EYE SURGERY Right    cataract  . TONSILLECTOMY      There were no vitals filed for this visit.  Subjective Assessment - 05/16/19 1018    Subjective  I got my new compression sleeve but I want you to show me how to put it on because me and my husband both have to work to put it on and my hand has been swelling some when I wear it.    Pertinent History  Patient was diagnosed on 03/15/2019 with left invasive ductal carcinoma breast cancer. Patient underwent a left lumpectomy and sentinel node biopsy (2 negative nodes) on 04/03/2019. It is ER/PR positive and HER2 negative with a Ki67 of 30%.    Patient Stated  Goals  See if my arm is doing ok    Currently in Pain?  No/denies            LYMPHEDEMA/ONCOLOGY QUESTIONNAIRE - 05/16/19 1114      Left Upper Extremity Lymphedema   10 cm Proximal to Olecranon Process  21.4 cm    Olecranon Process  20.4 cm    10 cm Proximal to Ulnar Styloid Process  17.3 cm    Just Proximal to Ulnar Styloid Process  14.1 cm    Across Hand at [employer]  16.7 cm    At [place] of 2nd Digit  5.4 cm                [facility] Adult PT Treatment/Exercise - 05/16/19 0001      Manual Therapy   Myofascial Release  To Lt axilla at area of cording    Manual Lymphatic Drainage (MLD)  In Supine: Short neck, 5 diaphragmatic breaths, Rt axillary and Lt inguinal nodes, anterior inter-axillary and Lt axillo-inguinal anastomosis, then Lt breast and UE focusing on forearm, then into Rt S/L for focus to inferior and lateral breast at area of fibrosis/scar tissue here directing along Lt axillo-inguinal anastomosis, then back to supine retracing all steps having  pt return all steps instructing her in this throughout    Passive ROM  To Lt shoulder into flexion and abductionto tolerance             PT Education - 05/16/19 1137    Education Details  Self manual lymph drainage    Person(s) Educated  Patient    Methods  Explanation;Demonstration;Tactile cues;Verbal cues;Handout    Comprehension  Verbalized understanding;Returned demonstration;Tactile cues required;Need further instruction          PT Long Term Goals - 05/07/19 1246      PT LONG TERM GOAL #1   Title  Patient will demonstrate she has regained full shoulder ROM and function post operatively compared to baseline assessment.    Time  4    Period  Weeks    Status  On-going    Target Date  06/04/19      PT LONG TERM GOAL #2   Title  Patient will improve DASH score to 0 (zero) for improved overall arm function and to return to baseline score.    Baseline  18.18 post op; 0 at baseline    Time  4     Period  Weeks    Status  New    Target Date  06/04/19      PT LONG TERM GOAL #3   Title  Patient will improve left shoulder active flexion to >/= 144 degrees for increased ease reaching overhead and to return to baseline.    Baseline  127 post op; 144 at baseline    Time  4    Period  Weeks    Target Date  06/04/19      PT LONG TERM GOAL #4   Title  Patient will improve left shoulder active abduction to >/= 160 degrees for increased ease reaching overhead and to return to baseline.    Baseline  131 degrees post op; 161 at baseline    Time  4    Period  Weeks    Status  New    Target Date  06/04/19      PT LONG TERM GOAL #5   Title  Patient will verbalize good understanding of lymphedema risk reduction practices.    Time  4    Period  Weeks    Status  New    Target Date  06/04/19            Plan - 05/16/19 1138    Clinical Impression Statement  Pt arrived late but next therapists next appt did not arrive so able to see pt longer. Instructed her in self manual lymph drainage today having her return demo of all. She was beginning to verbalize some understanding of sequence of MLD and started to show good strtch and pressure intermittently by end of session. She will need further review and instruction for technique. Pt did well and benefitted from VCs and hand over hand pressure. Handout issued and instructed pt to begin practicing at home tonight while it's still fresh in her mind. She verbalized understanding and plans to practice daily until next session next week. Pt was able to get a compression sleeve last week and has been wearing it daily. She reports noticing her back of hand has begun to swell some but she was wearing her sleeve proximal to ulnar styloid. Showed her how to use rubber glove to don glove properly and to make sure it stays inferior to ulnar styloid process.    Stability/Clinical Decision Making  Stable/Uncomplicated    Rehab Potential  Excellent    PT  Frequency  2x / week    PT Duration  4 weeks    PT Treatment/Interventions  ADLs/Self Care Home Management;Patient/family education;Therapeutic exercise;Passive range of motion;Scar mobilization;Manual techniques;Manual lymph drainage;Therapeutic activities    PT Next Visit Plan  Cont PROM left shoulder, cont and review manual lymph drainage for left arm focused on her forearm and breast having her cont to return demo, also scar massage for left inferior breast. See if hand swelling persisted despite being sure to now wear sleeve over ulnar styloid/get gauntlet if so (has no finger swelling).    PT Home Exercise Plan  Post op shoulder ROM HEP and neural stretching; self MLD    Consulted and Agree with Plan of Care  Patient       Patient will benefit from skilled therapeutic intervention in order to improve the following deficits and impairments:  Postural dysfunction, Decreased range of motion, Impaired UE functional use, Pain, Decreased knowledge of precautions, Increased edema, Decreased scar mobility  Visit Diagnosis: Malignant neoplasm of lower-outer quadrant of left breast of female, estrogen receptor positive (HCC)  Abnormal posture  Aftercare following surgery for neoplasm  Stiffness of left shoulder, not elsewhere classified  Lymphedema, not elsewhere classified     Problem List Patient Active Problem List   Diagnosis Date Noted  . Malignant neoplasm of upper-inner quadrant of left breast in female, estrogen receptor positive ([place]) 03/16/2019  . Primary osteoarthritis involving multiple joints 10/04/2017  . Hypothyroidism due to acquired atrophy of thyroid 05/31/2017  . Depression 04/03/2013  . Hyperlipemia 12/14/2012  . Vitamin D deficiency 12/14/2012  . Allergic rhinitis 12/14/2012  . 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 Osteopenia     Otelia Limes, PTA 05/16/2019, 11:46 AM  [place], [place], [address] Phone: [phone]   Fax:  [phone]  Name: Kayla Randolph MRN: [id number] Date of Birth: 02-23-1946

## 2019-05-17 ENCOUNTER — Other Ambulatory Visit: Payer: Self-pay

## 2019-05-17 ENCOUNTER — Ambulatory Visit
Admission: RE | Admit: 2019-05-17 | Discharge: 2019-05-17 | Disposition: A | Discharge status: Home / Self Care | Payer: Medicare Other | Source: Ambulatory Visit | Attending: Radiation Oncology | Admitting: Radiation Oncology

## 2019-05-17 ENCOUNTER — Ambulatory Visit: Payer: Medicare Other

## 2019-05-17 DIAGNOSIS — Z510 Encounter for antineoplastic radiation therapy: Secondary | ICD-10-CM | POA: Diagnosis not present

## 2019-05-17 DIAGNOSIS — C50212 Malignant neoplasm of upper-inner quadrant of left female breast: Secondary | ICD-10-CM

## 2019-05-17 MED ORDER — ALRA NON-METALLIC DEODORANT (RAD-ONC)
1.0000 "application " | Freq: Once | TOPICAL | Status: AC
  Administered 2019-05-17: 1 via TOPICAL

## 2019-05-17 MED ORDER — SONAFINE EX EMUL
1.0000 "application " | Freq: Two times a day (BID) | CUTANEOUS | Status: DC
  Administered 2019-05-17: 1 via TOPICAL

## 2019-05-21 ENCOUNTER — Ambulatory Visit: Payer: Medicare Other

## 2019-05-21 ENCOUNTER — Other Ambulatory Visit: Payer: Self-pay

## 2019-05-21 ENCOUNTER — Ambulatory Visit: Payer: Medicare Other | Attending: Surgery

## 2019-05-21 ENCOUNTER — Ambulatory Visit
Admission: RE | Admit: 2019-05-21 | Discharge: 2019-05-21 | Disposition: A | Discharge status: Home / Self Care | Payer: Medicare Other | Source: Ambulatory Visit | Attending: Radiation Oncology | Admitting: Radiation Oncology

## 2019-05-21 DIAGNOSIS — M25612 Stiffness of left shoulder, not elsewhere classified: Secondary | ICD-10-CM

## 2019-05-21 DIAGNOSIS — Z483 Aftercare following surgery for neoplasm: Secondary | ICD-10-CM | POA: Diagnosis present

## 2019-05-21 DIAGNOSIS — C50212 Malignant neoplasm of upper-inner quadrant of left female breast: Secondary | ICD-10-CM | POA: Diagnosis present

## 2019-05-21 DIAGNOSIS — C50512 Malignant neoplasm of lower-outer quadrant of left female breast: Secondary | ICD-10-CM | POA: Diagnosis present

## 2019-05-21 DIAGNOSIS — R293 Abnormal posture: Secondary | ICD-10-CM

## 2019-05-21 DIAGNOSIS — Z170 Estrogen receptor positive status [ER+]: Secondary | ICD-10-CM | POA: Insufficient documentation

## 2019-05-21 DIAGNOSIS — I890 Lymphedema, not elsewhere classified: Secondary | ICD-10-CM

## 2019-05-21 DIAGNOSIS — Z510 Encounter for antineoplastic radiation therapy: Secondary | ICD-10-CM | POA: Insufficient documentation

## 2019-05-21 NOTE — Therapy (Signed)
[place], [place], [address] Phone: [phone]   Fax:  [phone]  Physical Therapy Treatment  Patient Details  Name: Kayla Randolph MRN: [id number] Date of Birth: 07-15-1945 Referring Provider (PT): Dr. Erroll Luna   Encounter Date: 05/21/2019  PT End of Session - 05/21/19 1418    Visit Number  5    Number of Visits  10    Date for PT Re-Evaluation  06/04/19    PT Start Time  5537   pt arrived late   PT Stop Time  1359    PT Time Calculation (min)  47 min    Activity Tolerance  Patient tolerated treatment well    Behavior During Therapy  [facility] for tasks assessed/performed       Past Medical History:  Diagnosis Date  . Allergy   . Anxiety   . Breast cancer ([place])   . Depression   . History of colon polyps   . Hyperlipidemia   . Hypothyroidism   . Interstitial cystitis    History of  . Osteopenia   . Vitamin D deficiency     Past Surgical History:  Procedure Laterality Date  . BREAST LUMPECTOMY WITH RADIOACTIVE SEED AND SENTINEL LYMPH NODE BIOPSY Left 04/03/2019   Procedure: LEFT BREAST LUMPECTOMY WITH RADIOACTIVE SEED AND SENTINEL LYMPH NODE MAPPING;  Surgeon: Erroll Luna, MD;  Location: [place];  Service: General;  Laterality: Left;  . CESAREAN SECTION    . EYE SURGERY Right    cataract  . TONSILLECTOMY      There were no vitals filed for this visit.  Subjective Assessment - 05/21/19 1316    Subjective  I've been wearing the compression sleeve daily making sure it covers my wrist and my hand swelling has settled down some. I think I will go get the gauntlet for my hand soon though becuase it's not completely gone. I tried the massage once but I need more review. I have been stretching a few times a day and I can tell the cording is much better.    Pertinent History  Patient was diagnosed on 03/15/2019 with left invasive ductal carcinoma breast cancer. Patient  underwent a left lumpectomy and sentinel node biopsy (2 negative nodes) on 04/03/2019. It is ER/PR positive and HER2 negative with a Ki67 of 30%.    Patient Stated Goals  See if my arm is doing ok                       [facility] Adult PT Treatment/Exercise - 05/21/19 0001      Manual Therapy   Myofascial Release  To Lt axilla at area of cording, 1 felt pop felt during stretching in axilla    Manual Lymphatic Drainage (MLD)  In Supine: Short neck, 5 diaphragmatic breaths, Rt axillary and Lt inguinal nodes, anterior inter-axillary and Lt axillo-inguinal anastomosis, then Lt breast and UE focusing on forearm, then into Rt S/L for focus to inferior and lateral breast at area of fibrosis/scar tissue here directing along Lt axillo-inguinal anastomosis, then back to supine retracing all steps having pt return all steps instructing her in this throughout    Passive ROM  To Lt shoulder into flexion, abduction, and D2 to tolerance                  PT Long Term Goals - 05/07/19 1246      PT LONG TERM GOAL #1  Title  Patient will demonstrate she has regained full shoulder ROM and function post operatively compared to baseline assessment.    Time  4    Period  Weeks    Status  On-going    Target Date  06/04/19      PT LONG TERM GOAL #2   Title  Patient will improve DASH score to 0 (zero) for improved overall arm function and to return to baseline score.    Baseline  18.18 post op; 0 at baseline    Time  4    Period  Weeks    Status  New    Target Date  06/04/19      PT LONG TERM GOAL #3   Title  Patient will improve left shoulder active flexion to >/= 144 degrees for increased ease reaching overhead and to return to baseline.    Baseline  127 post op; 144 at baseline    Time  4    Period  Weeks    Target Date  06/04/19      PT LONG TERM GOAL #4   Title  Patient will improve left shoulder active abduction to >/= 160 degrees for increased ease reaching overhead and to  return to baseline.    Baseline  131 degrees post op; 161 at baseline    Time  4    Period  Weeks    Status  New    Target Date  06/04/19      PT LONG TERM GOAL #5   Title  Patient will verbalize good understanding of lymphedema risk reduction practices.    Time  4    Period  Weeks    Status  New    Target Date  06/04/19            Plan - 05/21/19 1419    Clinical Impression Statement  Continued with review of manual lymph drainage while performing this today. Also continued with myofascial release to Lt axilla and 1 audible and papable pop during stretching today. Pts cording has some improved since start of care.    Stability/Clinical Decision Making  Stable/Uncomplicated    Rehab Potential  Excellent    PT Frequency  2x / week    PT Duration  4 weeks    PT Treatment/Interventions  ADLs/Self Care Home Management;Patient/family education;Therapeutic exercise;Passive range of motion;Scar mobilization;Manual techniques;Manual lymph drainage;Therapeutic activities    PT Next Visit Plan  Cont PROM left shoulder, cont and review manual lymph drainage for left arm focused on her forearm and breast having her cont to return demo, also scar massage for left inferior breast. See if hand swelling persisted despite being sure to now wear sleeve over ulnar styloid/get gauntlet if so (has no finger swelling). Add supine scapular series once pt is competent with self MLD.    PT Home Exercise Plan  Post op shoulder ROM HEP and neural stretching; self MLD    Consulted and Agree with Plan of Care  Patient       Patient will benefit from skilled therapeutic intervention in order to improve the following deficits and impairments:  Postural dysfunction, Decreased range of motion, Impaired UE functional use, Pain, Decreased knowledge of precautions, Increased edema, Decreased scar mobility  Visit Diagnosis: Malignant neoplasm of lower-outer quadrant of left breast of female, estrogen receptor  positive (HCC)  Abnormal posture  Aftercare following surgery for neoplasm  Stiffness of left shoulder, not elsewhere classified  Lymphedema, not elsewhere classified  Problem List Patient Active Problem List   Diagnosis Date Noted  . Malignant neoplasm of upper-inner quadrant of left breast in female, estrogen receptor positive ([place]) 03/16/2019  . Primary osteoarthritis involving multiple joints 10/04/2017  . Hypothyroidism due to acquired atrophy of thyroid 05/31/2017  . Depression 04/03/2013  . Hyperlipemia 12/14/2012  . Vitamin D deficiency 12/14/2012  . Allergic rhinitis 12/14/2012  . 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 Osteopenia     Otelia Limes, PTA 05/21/2019, 2:23 PM  [place], [place], [address] Phone: [phone]   Fax:  [phone]  Name: JOVANNA HODGES MRN: [id number] Date of Birth: Jan 06, 1946

## 2019-05-22 ENCOUNTER — Ambulatory Visit: Payer: Medicare Other

## 2019-05-22 ENCOUNTER — Ambulatory Visit
Admission: RE | Admit: 2019-05-22 | Discharge: 2019-05-22 | Disposition: A | Discharge status: Home / Self Care | Payer: Medicare Other | Source: Ambulatory Visit | Attending: Radiation Oncology | Admitting: Radiation Oncology

## 2019-05-22 ENCOUNTER — Encounter: Payer: Self-pay | Admitting: *Deleted

## 2019-05-22 ENCOUNTER — Other Ambulatory Visit: Payer: Self-pay

## 2019-05-22 DIAGNOSIS — Z510 Encounter for antineoplastic radiation therapy: Secondary | ICD-10-CM | POA: Diagnosis not present

## 2019-05-23 ENCOUNTER — Ambulatory Visit
Admission: RE | Admit: 2019-05-23 | Discharge: 2019-05-23 | Disposition: A | Discharge status: Home / Self Care | Payer: Medicare Other | Source: Ambulatory Visit | Attending: Radiation Oncology | Admitting: Radiation Oncology

## 2019-05-23 ENCOUNTER — Ambulatory Visit: Payer: Medicare Other

## 2019-05-23 ENCOUNTER — Ambulatory Visit: Payer: Medicare Other | Admitting: Rehabilitation

## 2019-05-23 ENCOUNTER — Encounter: Payer: Self-pay | Admitting: Rehabilitation

## 2019-05-23 ENCOUNTER — Other Ambulatory Visit: Payer: Self-pay

## 2019-05-23 DIAGNOSIS — C50512 Malignant neoplasm of lower-outer quadrant of left female breast: Secondary | ICD-10-CM | POA: Diagnosis not present

## 2019-05-23 DIAGNOSIS — Z170 Estrogen receptor positive status [ER+]: Secondary | ICD-10-CM

## 2019-05-23 DIAGNOSIS — I890 Lymphedema, not elsewhere classified: Secondary | ICD-10-CM

## 2019-05-23 DIAGNOSIS — Z483 Aftercare following surgery for neoplasm: Secondary | ICD-10-CM

## 2019-05-23 DIAGNOSIS — M25612 Stiffness of left shoulder, not elsewhere classified: Secondary | ICD-10-CM

## 2019-05-23 DIAGNOSIS — Z510 Encounter for antineoplastic radiation therapy: Secondary | ICD-10-CM | POA: Diagnosis not present

## 2019-05-23 DIAGNOSIS — R293 Abnormal posture: Secondary | ICD-10-CM

## 2019-05-23 NOTE — Therapy (Signed)
[place], [place], [address] Phone: [phone]   Fax:  [phone]  Physical Therapy Treatment  Patient Details  Name: Kayla Randolph MRN: [id number] Date of Birth: [birth year] Referring Provider (PT): Dr. Erroll Luna   Encounter Date: 05/23/2019  PT End of Session - 05/23/19 1357    Visit Number  6    Number of Visits  10    Date for PT Re-Evaluation  06/04/19    PT Start Time  7209    PT Stop Time  1355    PT Time Calculation (min)  51 min    Activity Tolerance  Patient tolerated treatment well    Behavior During Therapy  [facility] for tasks assessed/performed       Past Medical History:  Diagnosis Date  . Allergy   . Anxiety   . Breast cancer ([place])   . Depression   . History of colon polyps   . Hyperlipidemia   . Hypothyroidism   . Interstitial cystitis    History of  . Osteopenia   . Vitamin D deficiency     Past Surgical History:  Procedure Laterality Date  . BREAST LUMPECTOMY WITH RADIOACTIVE SEED AND SENTINEL LYMPH NODE BIOPSY Left 04/03/2019   Procedure: LEFT BREAST LUMPECTOMY WITH RADIOACTIVE SEED AND SENTINEL LYMPH NODE MAPPING;  Surgeon: Erroll Luna, MD;  Location: [place];  Service: General;  Laterality: Left;  . CESAREAN SECTION    . EYE SURGERY Right    cataract  . TONSILLECTOMY      There were no vitals filed for this visit.  Subjective Assessment - 05/23/19 1304    Subjective  I got a gauntlet yesterday. It may be a little too long on the pinky side.  I am starting to get some pink.    Pertinent History  Patient was diagnosed on 03/15/2019 with left invasive ductal carcinoma breast cancer. Patient underwent a left lumpectomy and sentinel node biopsy (2 negative nodes) on 04/03/2019. It is ER/PR positive and HER2 negative with a Ki67 of 30%.    Currently in Pain?  No/denies                       [facility] Adult PT Treatment/Exercise -  05/23/19 0001      Manual Therapy   Manual Therapy  Edema management    Manual therapy comments  Reviewed self MLD thoroughly today; printed a new home sheet and added arrows, steps and different cues to her pictures to allow for better performance at home  pt does not have access to a DVD player or computer so no links provided.  Performed the whole sequence in seated including the left breast and whole arm.  Moderate cueing needed and repeated cues for hand technique.  Overall pt has not performed self MLD yet.  Also discussed stopping the breast portion when it becomes red or tender.      Edema Management  pt arrives with new gauntlet which is a little long on the ulnar side so she is going to see if it is comfortable and let a special place know if a new one isneeded.  Also discussed bras at [employer].                    PT Long Term Goals - 05/07/19 1246      PT LONG TERM GOAL #1   Title  Patient will demonstrate she  has regained full shoulder ROM and function post operatively compared to baseline assessment.    Time  4    Period  Weeks    Status  On-going    Target Date  06/04/19      PT LONG TERM GOAL #2   Title  Patient will improve DASH score to 0 (zero) for improved overall arm function and to return to baseline score.    Baseline  18.18 post op; 0 at baseline    Time  4    Period  Weeks    Status  New    Target Date  06/04/19      PT LONG TERM GOAL #3   Title  Patient will improve left shoulder active flexion to >/= 144 degrees for increased ease reaching overhead and to return to baseline.    Baseline  127 post op; 144 at baseline    Time  4    Period  Weeks    Target Date  06/04/19      PT LONG TERM GOAL #4   Title  Patient will improve left shoulder active abduction to >/= 160 degrees for increased ease reaching overhead and to return to baseline.    Baseline  131 degrees post op; 161 at baseline    Time  4    Period  Weeks    Status  New     Target Date  06/04/19      PT LONG TERM GOAL #5   Title  Patient will verbalize good understanding of lymphedema risk reduction practices.    Time  4    Period  Weeks    Status  New    Target Date  06/04/19            Plan - 05/23/19 1358    Clinical Impression Statement  Pt not able to perform self MLD yet due to being confused about the steps on the paper so the paper was broken down today with drawings, steps, and word changed to make it easier to understand.  Pt still needing moderate cueing for completion but happy to have more details for home.  No other manual work done today due to self MDL focus    PT Frequency  2x / week    PT Duration  4 weeks    PT Treatment/Interventions  ADLs/Self Care Home Management;Patient/family education;Therapeutic exercise;Passive range of motion;Scar mobilization;Manual techniques;Manual lymph drainage;Therapeutic activities    PT Next Visit Plan  Cont PROM left shoulder, cont and review manual lymph drainage for left arm focused on her forearm and breast having her cont to return demo, also scar massage for left inferior breast. See if hand swelling persisted despite being sure to now wear sleeve over ulnar styloid/get gauntlet if so (has no finger swelling). Add supine scapular series once pt is competent with self MLD.    Consulted and Agree with Plan of Care  Patient       Patient will benefit from skilled therapeutic intervention in order to improve the following deficits and impairments:     Visit Diagnosis: Malignant neoplasm of lower-outer quadrant of left breast of female, estrogen receptor positive ([place])  Abnormal posture  Aftercare following surgery for neoplasm  Lymphedema, not elsewhere classified  Stiffness of left shoulder, not elsewhere classified     Problem List Patient Active Problem List   Diagnosis Date Noted  . Malignant neoplasm of upper-inner quadrant of left breast in female, estrogen receptor positive ([place])  03/16/2019  .  Primary osteoarthritis involving multiple joints 10/04/2017  . Hypothyroidism due to acquired atrophy of thyroid 05/31/2017  . Depression 04/03/2013  . Hyperlipemia 12/14/2012  . Vitamin D deficiency 12/14/2012  . Allergic rhinitis 12/14/2012  . 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 Osteopenia     Stark Bray 05/23/2019, 2:00 PM  [place], [place], [address] Phone: [phone]   Fax:  [phone]  Name: Kayla Randolph MRN: [id number] Date of Birth: 1945-06-18

## 2019-05-24 ENCOUNTER — Ambulatory Visit
Admission: RE | Admit: 2019-05-24 | Discharge: 2019-05-24 | Disposition: A | Discharge status: Home / Self Care | Payer: Medicare Other | Source: Ambulatory Visit | Attending: Radiation Oncology | Admitting: Radiation Oncology

## 2019-05-24 ENCOUNTER — Other Ambulatory Visit: Payer: Self-pay

## 2019-05-24 ENCOUNTER — Ambulatory Visit: Payer: Medicare Other

## 2019-05-24 DIAGNOSIS — Z510 Encounter for antineoplastic radiation therapy: Secondary | ICD-10-CM | POA: Diagnosis not present

## 2019-05-25 ENCOUNTER — Ambulatory Visit
Admission: RE | Admit: 2019-05-25 | Discharge: 2019-05-25 | Disposition: A | Discharge status: Home / Self Care | Payer: Medicare Other | Source: Ambulatory Visit | Attending: Radiation Oncology | Admitting: Radiation Oncology

## 2019-05-25 ENCOUNTER — Other Ambulatory Visit: Payer: Self-pay

## 2019-05-25 ENCOUNTER — Ambulatory Visit: Payer: Medicare Other

## 2019-05-25 DIAGNOSIS — Z510 Encounter for antineoplastic radiation therapy: Secondary | ICD-10-CM | POA: Diagnosis not present

## 2019-05-28 ENCOUNTER — Ambulatory Visit: Payer: Medicare Other

## 2019-05-28 ENCOUNTER — Ambulatory Visit
Admission: RE | Admit: 2019-05-28 | Discharge: 2019-05-28 | Disposition: A | Discharge status: Home / Self Care | Payer: Medicare Other | Source: Ambulatory Visit | Attending: Radiation Oncology | Admitting: Radiation Oncology

## 2019-05-28 ENCOUNTER — Other Ambulatory Visit: Payer: Self-pay

## 2019-05-28 DIAGNOSIS — I890 Lymphedema, not elsewhere classified: Secondary | ICD-10-CM

## 2019-05-28 DIAGNOSIS — Z170 Estrogen receptor positive status [ER+]: Secondary | ICD-10-CM

## 2019-05-28 DIAGNOSIS — M25612 Stiffness of left shoulder, not elsewhere classified: Secondary | ICD-10-CM

## 2019-05-28 DIAGNOSIS — Z510 Encounter for antineoplastic radiation therapy: Secondary | ICD-10-CM | POA: Diagnosis not present

## 2019-05-28 DIAGNOSIS — C50512 Malignant neoplasm of lower-outer quadrant of left female breast: Secondary | ICD-10-CM | POA: Diagnosis not present

## 2019-05-28 DIAGNOSIS — Z483 Aftercare following surgery for neoplasm: Secondary | ICD-10-CM

## 2019-05-28 DIAGNOSIS — R293 Abnormal posture: Secondary | ICD-10-CM

## 2019-05-28 NOTE — Therapy (Signed)
[place], [place], [address] Phone: [phone]   Fax:  [phone]  Physical Therapy Treatment  Patient Details  Name: Kayla Randolph MRN: [id number] Date of Birth: 1945/07/31 Referring Provider (PT): Dr. Erroll Luna   Encounter Date: 05/28/2019  PT End of Session - 05/28/19 1353    Visit Number  7    Number of Visits  10    Date for PT Re-Evaluation  06/04/19    PT Start Time  2297   started pt after she finished in restroom   PT Stop Time  1350   pt had to leave early for radiation appt   PT Time Calculation (min)  43 min    Activity Tolerance  Patient tolerated treatment well    Behavior During Therapy  [facility] for tasks assessed/performed       Past Medical History:  Diagnosis Date  . Allergy   . Anxiety   . Breast cancer ([place])   . Depression   . History of colon polyps   . Hyperlipidemia   . Hypothyroidism   . Interstitial cystitis    History of  . Osteopenia   . Vitamin D deficiency     Past Surgical History:  Procedure Laterality Date  . BREAST LUMPECTOMY WITH RADIOACTIVE SEED AND SENTINEL LYMPH NODE BIOPSY Left 04/03/2019   Procedure: LEFT BREAST LUMPECTOMY WITH RADIOACTIVE SEED AND SENTINEL LYMPH NODE MAPPING;  Surgeon: Erroll Luna, MD;  Location: [place];  Service: General;  Laterality: Left;  . CESAREAN SECTION    . EYE SURGERY Right    cataract  . TONSILLECTOMY      There were no vitals filed for this visit.  Subjective Assessment - 05/28/19 1310    Subjective  I've been wearing mycompression sleeve and gauntlet all day yesterday and my hand swelling has been doing real well. I'm still real confused about the self MLD.    Pertinent History  Patient was diagnosed on 03/15/2019 with left invasive ductal carcinoma breast cancer. Patient underwent a left lumpectomy and sentinel node biopsy (2 negative nodes) on 04/03/2019. It is ER/PR positive and HER2  negative with a Ki67 of 30%.    Patient Stated Goals  See if my arm is doing ok    Currently in Pain?  No/denies                       [facility] Adult PT Treatment/Exercise - 05/28/19 0001      Manual Therapy   Manual Lymphatic Drainage (MLD)  In Supine: Short neck, 5 diaphragmatic breaths, Rt axillary and Lt inguinal nodes, anterior inter-axillary and Lt axillo-inguinal anastomosis, then Lt breast and UE focusing on forearm, then into Rt S/L for focus to inferior and lateral breast at area of fibrosis/scar tissue here directing along Lt axillo-inguinal anastomosis, then back to supine retracing all steps having pt return all steps reviewing with her throughout                  PT Long Term Goals - 05/07/19 1246      PT LONG TERM GOAL #1   Title  Patient will demonstrate she has regained full shoulder ROM and function post operatively compared to baseline assessment.    Time  4    Period  Weeks    Status  On-going    Target Date  06/04/19      PT LONG TERM GOAL #2  Title  Patient will improve DASH score to 0 (zero) for improved overall arm function and to return to baseline score.    Baseline  18.18 post op; 0 at baseline    Time  4    Period  Weeks    Status  New    Target Date  06/04/19      PT LONG TERM GOAL #3   Title  Patient will improve left shoulder active flexion to >/= 144 degrees for increased ease reaching overhead and to return to baseline.    Baseline  127 post op; 144 at baseline    Time  4    Period  Weeks    Target Date  06/04/19      PT LONG TERM GOAL #4   Title  Patient will improve left shoulder active abduction to >/= 160 degrees for increased ease reaching overhead and to return to baseline.    Baseline  131 degrees post op; 161 at baseline    Time  4    Period  Weeks    Status  New    Target Date  06/04/19      PT LONG TERM GOAL #5   Title  Patient will verbalize good understanding of lymphedema risk reduction practices.     Time  4    Period  Weeks    Status  New    Target Date  06/04/19            Plan - 05/28/19 1354    Clinical Impression Statement  Pt has tried self MLD but reports not being able to get past initial pathways (anastomosis) and wants to mostly focus on breast as her compression sleeve and gauntlet is controlling her UE lymphedema for now. So continued with review of manual lymph drainage while performing spending time instructing her and re-educating her on sequence throughout. She reports feeling better about process now and will review once back home after radiation. Also reviewed importance of her wearing her chip pack at inferior breast where fibrosis and lymphedema worse and she reports will resume wearing this. Had stopped due to being fearful of "dots" left by foam but explained to her this was normal and a good response from her breast tissue. Pt verbalized understanding all.    Stability/Clinical Decision Making  Stable/Uncomplicated    Rehab Potential  Excellent    PT Frequency  2x / week    PT Duration  4 weeks    PT Treatment/Interventions  ADLs/Self Care Home Management;Patient/family education;Therapeutic exercise;Passive range of motion;Scar mobilization;Manual techniques;Manual lymph drainage;Therapeutic activities    PT Next Visit Plan  Cont PROM left shoulder, cont and review manual lymph drainage for left arm focused on her forearm and breast having her cont to return demo, also scar massage for left inferior breast. See if hand swelling persisted despite being sure to now wear sleeve over ulnar styloid/get gauntlet if so (has no finger swelling). Add supine scapular series once pt is competent with self MLD.    PT Home Exercise Plan  Post op shoulder ROM HEP and neural stretching; self MLD    Consulted and Agree with Plan of Care  Patient       Patient will benefit from skilled therapeutic intervention in order to improve the following deficits and impairments:  Postural  dysfunction, Decreased range of motion, Impaired UE functional use, Pain, Decreased knowledge of precautions, Increased edema, Decreased scar mobility  Visit Diagnosis: Malignant neoplasm of lower-outer quadrant of left  breast of female, estrogen receptor positive ([place])  Abnormal posture  Aftercare following surgery for neoplasm  Lymphedema, not elsewhere classified  Stiffness of left shoulder, not elsewhere classified     Problem List Patient Active Problem List   Diagnosis Date Noted  . Malignant neoplasm of upper-inner quadrant of left breast in female, estrogen receptor positive ([place]) 03/16/2019  . Primary osteoarthritis involving multiple joints 10/04/2017  . Hypothyroidism due to acquired atrophy of thyroid 05/31/2017  . Depression 04/03/2013  . Hyperlipemia 12/14/2012  . Vitamin D deficiency 12/14/2012  . Allergic rhinitis 12/14/2012  . 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 Osteopenia     Otelia Limes, PTA 05/28/2019, 2:01 PM  [place], [place], [address] Phone: [phone]   Fax:  [phone]  Name: Kayla Randolph MRN: [id number] Date of Birth: [birth year]

## 2019-05-29 ENCOUNTER — Other Ambulatory Visit: Payer: Self-pay

## 2019-05-29 ENCOUNTER — Ambulatory Visit
Admission: RE | Admit: 2019-05-29 | Discharge: 2019-05-29 | Disposition: A | Discharge status: Home / Self Care | Payer: Medicare Other | Source: Ambulatory Visit | Attending: Radiation Oncology | Admitting: Radiation Oncology

## 2019-05-29 ENCOUNTER — Ambulatory Visit: Payer: Medicare Other

## 2019-05-29 DIAGNOSIS — Z510 Encounter for antineoplastic radiation therapy: Secondary | ICD-10-CM | POA: Diagnosis not present

## 2019-05-30 ENCOUNTER — Other Ambulatory Visit: Payer: Self-pay

## 2019-05-30 ENCOUNTER — Ambulatory Visit
Admission: RE | Admit: 2019-05-30 | Discharge: 2019-05-30 | Disposition: A | Discharge status: Home / Self Care | Payer: Medicare Other | Source: Ambulatory Visit | Attending: Radiation Oncology | Admitting: Radiation Oncology

## 2019-05-30 ENCOUNTER — Ambulatory Visit: Payer: Medicare Other

## 2019-05-30 ENCOUNTER — Ambulatory Visit: Payer: Medicare Other | Admitting: Rehabilitation

## 2019-05-30 ENCOUNTER — Encounter: Payer: Self-pay | Admitting: Rehabilitation

## 2019-05-30 DIAGNOSIS — R293 Abnormal posture: Secondary | ICD-10-CM

## 2019-05-30 DIAGNOSIS — M25612 Stiffness of left shoulder, not elsewhere classified: Secondary | ICD-10-CM

## 2019-05-30 DIAGNOSIS — Z483 Aftercare following surgery for neoplasm: Secondary | ICD-10-CM

## 2019-05-30 DIAGNOSIS — I890 Lymphedema, not elsewhere classified: Secondary | ICD-10-CM

## 2019-05-30 DIAGNOSIS — Z170 Estrogen receptor positive status [ER+]: Secondary | ICD-10-CM

## 2019-05-30 DIAGNOSIS — Z510 Encounter for antineoplastic radiation therapy: Secondary | ICD-10-CM | POA: Diagnosis not present

## 2019-05-30 DIAGNOSIS — C50512 Malignant neoplasm of lower-outer quadrant of left female breast: Secondary | ICD-10-CM

## 2019-05-30 NOTE — Therapy (Signed)
[place], [place], [address] Phone: [phone]   Fax:  [phone]  Physical Therapy Treatment  Patient Details  Name: Kayla Randolph MRN: [id number] Date of Birth: [birth year] Referring Provider (PT): Dr. Erroll Luna   Encounter Date: 05/30/2019  PT End of Session - 05/30/19 1348    Visit Number  8    Number of Visits  10    Date for PT Re-Evaluation  06/04/19    PT Start Time  1300    PT Stop Time  1345    PT Time Calculation (min)  45 min    Activity Tolerance  Patient tolerated treatment well    Behavior During Therapy  [facility] for tasks assessed/performed       Past Medical History:  Diagnosis Date  . Allergy   . Anxiety   . Breast cancer ([place])   . Depression   . History of colon polyps   . Hyperlipidemia   . Hypothyroidism   . Interstitial cystitis    History of  . Osteopenia   . Vitamin D deficiency     Past Surgical History:  Procedure Laterality Date  . BREAST LUMPECTOMY WITH RADIOACTIVE SEED AND SENTINEL LYMPH NODE BIOPSY Left 04/03/2019   Procedure: LEFT BREAST LUMPECTOMY WITH RADIOACTIVE SEED AND SENTINEL LYMPH NODE MAPPING;  Surgeon: Erroll Luna, MD;  Location: [place];  Service: General;  Laterality: Left;  . CESAREAN SECTION    . EYE SURGERY Right    cataract  . TONSILLECTOMY      There were no vitals filed for this visit.  Subjective Assessment - 05/30/19 1259    Subjective  The arm is doing pretty well.  I still haven't gotten the hang of the massage thing.  I have tried it a couple times. I think my arm is doing well.    Pertinent History  Patient was diagnosed on 03/15/2019 with left invasive ductal carcinoma breast cancer. Patient underwent a left lumpectomy and sentinel node biopsy (2 negative nodes) on 04/03/2019. It is ER/PR positive and HER2 negative with a Ki67 of 30%.    Currently in Pain?  No/denies                        [facility] Adult PT Treatment/Exercise - 05/30/19 0001      Manual Therapy   Manual therapy comments  continued with MLD questions throughout; as pt feels overwhelemed with all of the steps together it was discussed that if she has minimal time she can focus on scar tissue work and the lateral breast axillo inuinal pathway    Edema Management  pt lost foam so another small 1/2" foam rectangle was given for lateral breast fibrosis    Myofascial Release  scar tissue work in sidelying to the left lateral breast with mild pressure into the breast fibrosis avoiding any skin sensitivity from radiation    Manual Lymphatic Drainage (MLD)   wearing gloves due to radiation fibrosis open areas from itching anterior chest; In Supine: Short neck, Rt axillary and Lt inguinal nodes, anterior inter-axillary and Lt axillo-inguinal anastomosis, then Lt breast and UE focusing on forearm, then into Rt S/L for focus to inferior and lateral breast at area of fibrosis/scar tissue here directing along Lt axillo-inguinal anastomosis, then back to supine retracing all steps having pt return all steps reviewing with her throughout  PT Long Term Goals - 05/07/19 1246      PT LONG TERM GOAL #1   Title  Patient will demonstrate she has regained full shoulder ROM and function post operatively compared to baseline assessment.    Time  4    Period  Weeks    Status  On-going    Target Date  06/04/19      PT LONG TERM GOAL #2   Title  Patient will improve DASH score to 0 (zero) for improved overall arm function and to return to baseline score.    Baseline  18.18 post op; 0 at baseline    Time  4    Period  Weeks    Status  New    Target Date  06/04/19      PT LONG TERM GOAL #3   Title  Patient will improve left shoulder active flexion to >/= 144 degrees for increased ease reaching overhead and to return to baseline.    Baseline  127 post op; 144 at baseline     Time  4    Period  Weeks    Target Date  06/04/19      PT LONG TERM GOAL #4   Title  Patient will improve left shoulder active abduction to >/= 160 degrees for increased ease reaching overhead and to return to baseline.    Baseline  131 degrees post op; 161 at baseline    Time  4    Period  Weeks    Status  New    Target Date  06/04/19      PT LONG TERM GOAL #5   Title  Patient will verbalize good understanding of lymphedema risk reduction practices.    Time  4    Period  Weeks    Status  New    Target Date  06/04/19            Plan - 05/30/19 1348    Clinical Impression Statement  Pt reports she can get to the arm part of the MLD and then it is too confusing.  Discussed how to shorten the sequence to include just breathing, scar tissue massage, and left inguinal nodes and lateral breast towards the pathway due to most trouble lateral breast near the scar tissue.  Anterior breast and chest starting to get red and some open places from pt itching so most of the anterior breast was not included today.  Discussed where to get radiation garments if needed as she reported her bra is starting to hurt.    PT Frequency  2x / week    PT Duration  4 weeks    PT Treatment/Interventions  ADLs/Self Care Home Management;Patient/family education;Therapeutic exercise;Passive range of motion;Scar mobilization;Manual techniques;Manual lymph drainage;Therapeutic activities    PT Next Visit Plan  Asess goals; does pt need to schedule any more? Continue left breast lateral focus, scar tissue work, and UE MLD with education as tolerated by radiation.  Pt will most likely have to take a break for skin shortly. Add any postural exercises or supine scap as tolerated    Consulted and Agree with Plan of Care  Patient       Patient will benefit from skilled therapeutic intervention in order to improve the following deficits and impairments:     Visit Diagnosis: Malignant neoplasm of lower-outer quadrant  of left breast of female, estrogen receptor positive ([facility])  Abnormal posture  Aftercare following surgery for neoplasm  Lymphedema, not elsewhere classified  Stiffness of left shoulder, not elsewhere classified     Problem List Patient Active Problem List   Diagnosis Date Noted  . Malignant neoplasm of upper-inner quadrant of left breast in female, estrogen receptor positive ([place]) 03/16/2019  . Primary osteoarthritis involving multiple joints 10/04/2017  . Hypothyroidism due to acquired atrophy of thyroid 05/31/2017  . Depression 04/03/2013  . Hyperlipemia 12/14/2012  . Vitamin D deficiency 12/14/2012  . Allergic rhinitis 12/14/2012  . 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 Osteopenia     Stark Bray 05/30/2019, 1:53 PM  [place], [place], [address] Phone: [phone]   Fax:  [phone]  Name: SOFIJA ANTWI MRN: [id number] Date of Birth: 27-Jan-1946

## 2019-05-31 ENCOUNTER — Ambulatory Visit
Admission: RE | Admit: 2019-05-31 | Discharge: 2019-05-31 | Disposition: A | Discharge status: Home / Self Care | Payer: Medicare Other | Source: Ambulatory Visit | Attending: Radiation Oncology | Admitting: Radiation Oncology

## 2019-05-31 ENCOUNTER — Ambulatory Visit: Payer: Medicare Other

## 2019-05-31 ENCOUNTER — Other Ambulatory Visit: Payer: Self-pay

## 2019-05-31 DIAGNOSIS — Z510 Encounter for antineoplastic radiation therapy: Secondary | ICD-10-CM | POA: Diagnosis not present

## 2019-06-01 ENCOUNTER — Ambulatory Visit: Payer: Medicare Other | Admitting: Radiation Oncology

## 2019-06-01 ENCOUNTER — Other Ambulatory Visit: Payer: Self-pay

## 2019-06-01 ENCOUNTER — Ambulatory Visit: Payer: Medicare Other

## 2019-06-01 ENCOUNTER — Ambulatory Visit
Admission: RE | Admit: 2019-06-01 | Discharge: 2019-06-01 | Disposition: A | Discharge status: Home / Self Care | Payer: Medicare Other | Source: Ambulatory Visit | Attending: Radiation Oncology | Admitting: Radiation Oncology

## 2019-06-01 DIAGNOSIS — Z510 Encounter for antineoplastic radiation therapy: Secondary | ICD-10-CM | POA: Diagnosis not present

## 2019-06-01 DIAGNOSIS — C50212 Malignant neoplasm of upper-inner quadrant of left female breast: Secondary | ICD-10-CM

## 2019-06-01 DIAGNOSIS — Z170 Estrogen receptor positive status [ER+]: Secondary | ICD-10-CM

## 2019-06-01 MED ORDER — SONAFINE EX EMUL
1.0000 "application " | Freq: Two times a day (BID) | CUTANEOUS | Status: DC
  Administered 2019-06-01: 1 via TOPICAL

## 2019-06-04 ENCOUNTER — Other Ambulatory Visit: Payer: Self-pay

## 2019-06-04 ENCOUNTER — Ambulatory Visit: Payer: Medicare Other

## 2019-06-04 ENCOUNTER — Ambulatory Visit
Admission: RE | Admit: 2019-06-04 | Discharge: 2019-06-04 | Disposition: A | Discharge status: Home / Self Care | Payer: Medicare Other | Source: Ambulatory Visit | Attending: Radiation Oncology | Admitting: Radiation Oncology

## 2019-06-04 DIAGNOSIS — R293 Abnormal posture: Secondary | ICD-10-CM

## 2019-06-04 DIAGNOSIS — C50512 Malignant neoplasm of lower-outer quadrant of left female breast: Secondary | ICD-10-CM | POA: Diagnosis not present

## 2019-06-04 DIAGNOSIS — M25612 Stiffness of left shoulder, not elsewhere classified: Secondary | ICD-10-CM

## 2019-06-04 DIAGNOSIS — Z483 Aftercare following surgery for neoplasm: Secondary | ICD-10-CM

## 2019-06-04 DIAGNOSIS — Z170 Estrogen receptor positive status [ER+]: Secondary | ICD-10-CM

## 2019-06-04 DIAGNOSIS — Z510 Encounter for antineoplastic radiation therapy: Secondary | ICD-10-CM | POA: Diagnosis not present

## 2019-06-04 DIAGNOSIS — I890 Lymphedema, not elsewhere classified: Secondary | ICD-10-CM

## 2019-06-04 NOTE — Therapy (Signed)
[place], [place], [address] Phone: [phone]   Fax:  [phone]  Physical Therapy Treatment  Patient Details  Name: Kayla Randolph MRN: [id number] Date of Birth: 1945/08/04 Referring Provider (PT): Dr. Erroll Luna   Encounter Date: 06/04/2019  PT End of Session - 06/04/19 1403    Visit Number  9    Number of Visits  10    Date for PT Re-Evaluation  06/04/19    PT Start Time  1856   pt arrived late   PT Stop Time  1402    PT Time Calculation (min)  55 min    Activity Tolerance  Patient tolerated treatment well    Behavior During Therapy  [facility] for tasks assessed/performed       Past Medical History:  Diagnosis Date  . Allergy   . Anxiety   . Breast cancer ([place])   . Depression   . History of colon polyps   . Hyperlipidemia   . Hypothyroidism   . Interstitial cystitis    History of  . Osteopenia   . Vitamin D deficiency     Past Surgical History:  Procedure Laterality Date  . BREAST LUMPECTOMY WITH RADIOACTIVE SEED AND SENTINEL LYMPH NODE BIOPSY Left 04/03/2019   Procedure: LEFT BREAST LUMPECTOMY WITH RADIOACTIVE SEED AND SENTINEL LYMPH NODE MAPPING;  Surgeon: Erroll Luna, MD;  Location: [place];  Service: General;  Laterality: Left;  . CESAREAN SECTION    . EYE SURGERY Right    cataract  . TONSILLECTOMY      There were no vitals filed for this visit.  Subjective Assessment - 06/04/19 1317    Subjective  My itching has gotten really bad so I'm going to talk to the people at radiation today to see what else I can use.    Pertinent History  Patient was diagnosed on 03/15/2019 with left invasive ductal carcinoma breast cancer. Patient underwent a left lumpectomy and sentinel node biopsy (2 negative nodes) on 04/03/2019. It is ER/PR positive and HER2 negative with a Ki67 of 30%.    Patient Stated Goals  See if my arm is doing ok    Currently in Pain?  No/denies                        [facility] Adult PT Treatment/Exercise - 06/04/19 0001      Manual Therapy   Myofascial Release  scar tissue work in sidelying to the left lateral breast with mild pressure into the breast fibrosis avoiding any skin sensitivity from radiation; also to Lt axilla over areas of cording during P/ROM    Manual Lymphatic Drainage (MLD)  In Supine: Short neck, 5 diaphragmatic breaths, Rt axillary and Lt inguinal nodes, anterior inter-axillary and Lt axillo-inguinal anastomosis, then Lt breast and UE focusing on forearm, then into Rt S/L for focus to inferior and lateral breast at area of fibrosis/scar tissue here, also directly inferior to scar where new area of fullness palpable today, directing along Lt axillo-inguinal and posterior inter-axillary anastomosis, then back to supine retracing all steps having pt return all steps reviewing with her throughout only breast portion so as less confusion for pt.     Passive ROM  To Lt shoulder into flexion, abduction, and D2 to tolerance                  PT Long Term Goals - 05/07/19 1246  PT LONG TERM GOAL #1   Title  Patient will demonstrate she has regained full shoulder ROM and function post operatively compared to baseline assessment.    Time  4    Period  Weeks    Status  On-going    Target Date  06/04/19      PT LONG TERM GOAL #2   Title  Patient will improve DASH score to 0 (zero) for improved overall arm function and to return to baseline score.    Baseline  18.18 post op; 0 at baseline    Time  4    Period  Weeks    Status  New    Target Date  06/04/19      PT LONG TERM GOAL #3   Title  Patient will improve left shoulder active flexion to >/= 144 degrees for increased ease reaching overhead and to return to baseline.    Baseline  127 post op; 144 at baseline    Time  4    Period  Weeks    Target Date  06/04/19      PT LONG TERM GOAL #4   Title  Patient will improve left shoulder active  abduction to >/= 160 degrees for increased ease reaching overhead and to return to baseline.    Baseline  131 degrees post op; 161 at baseline    Time  4    Period  Weeks    Status  New    Target Date  06/04/19      PT LONG TERM GOAL #5   Title  Patient will verbalize good understanding of lymphedema risk reduction practices.    Time  4    Period  Weeks    Status  New    Target Date  06/04/19            Plan - 06/04/19 1551    Clinical Impression Statement  Continued with manual therapy for Lt upper quadrant. Continued to review, while performing, manual lymph drainage focusing on reviewing with her breast portion only as incorporating UE increases her confusion. Her cording is still very present at Lt axilla into upper arm and limitng to her P/ and A/ROM. She did not present with open sores as these has healed some over weekend so did not wear glaoves but also avoided hands on at anterior chest wall due to increased skin sensitivity.    Stability/Clinical Decision Making  Stable/Uncomplicated    Rehab Potential  Excellent    PT Frequency  2x / week    PT Duration  4 weeks    PT Treatment/Interventions  ADLs/Self Care Home Management;Patient/family education;Therapeutic exercise;Passive range of motion;Scar mobilization;Manual techniques;Manual lymph drainage;Therapeutic activities    PT Next Visit Plan  Assess goals. Renewal neede next but pt would like to be on hold after next visit until a few weeks after radiation to allow her skin time to heal. Then resume with finalizing instruction of self MLD and add any tolerated postural or supine scapular series exs.    PT Home Exercise Plan  Post op shoulder ROM HEP and neural stretching; self MLD    Recommended Other Services  Script for compression bras sent to Dr. Clarene Critchley and Agree with Plan of Care  Patient       Patient will benefit from skilled therapeutic intervention in order to improve the following deficits and  impairments:  Postural dysfunction, Decreased range of motion, Impaired UE functional use, Pain, Decreased knowledge  of precautions, Increased edema, Decreased scar mobility  Visit Diagnosis: Malignant neoplasm of lower-outer quadrant of left breast of female, estrogen receptor positive ([facility])  Abnormal posture  Aftercare following surgery for neoplasm  Lymphedema, not elsewhere classified  Stiffness of left shoulder, not elsewhere classified     Problem List Patient Active Problem List   Diagnosis Date Noted  . 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 Primary osteoarthritis involving multiple joints 10/04/2017  . Hypothyroidism due to acquired atrophy of thyroid 05/31/2017  . Depression 04/03/2013  . Hyperlipemia 12/14/2012  . Vitamin D deficiency 12/14/2012  . Allergic rhinitis 12/14/2012  . 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 Osteopenia     Otelia Limes, PTA 06/04/2019, 4:00 PM  [place], [place], [address] Phone: [phone]   Fax:  [phone]  Name: Kayla Randolph MRN: [id number] Date of Birth: 01/15/1946

## 2019-06-05 ENCOUNTER — Ambulatory Visit
Admission: RE | Admit: 2019-06-05 | Discharge: 2019-06-05 | Disposition: A | Discharge status: Home / Self Care | Payer: Medicare Other | Source: Ambulatory Visit | Attending: Radiation Oncology | Admitting: Radiation Oncology

## 2019-06-05 ENCOUNTER — Ambulatory Visit: Payer: Medicare Other

## 2019-06-05 ENCOUNTER — Ambulatory Visit: Payer: Medicare Other | Attending: Internal Medicine

## 2019-06-05 ENCOUNTER — Other Ambulatory Visit: Payer: Self-pay

## 2019-06-05 ENCOUNTER — Ambulatory Visit: Payer: Medicare Other | Admitting: Radiation Oncology

## 2019-06-05 DIAGNOSIS — Z510 Encounter for antineoplastic radiation therapy: Secondary | ICD-10-CM | POA: Diagnosis not present

## 2019-06-05 DIAGNOSIS — Z23 Encounter for immunization: Secondary | ICD-10-CM | POA: Insufficient documentation

## 2019-06-05 NOTE — Progress Notes (Signed)
   Covid-19 Vaccination Clinic  Name:  Kayla Randolph    MRN: [id number] DOB: [birth year]  06/05/2019  Kayla Randolph was observed post Covid-19 immunization for 15 minutes without incidence. She was provided with Vaccine Information Sheet and instruction to access the V-Safe system.   Kayla Randolph was instructed to call 911 with any severe reactions post vaccine: Marland Kitchen Difficulty breathing  . Swelling of your face and throat  . A fast heartbeat  . A bad rash all over your body  . Dizziness and weakness    Immunizations Administered    Name Date Dose VIS Date Route   Pfizer COVID-19 Vaccine 06/05/2019  5:14 PM 0.3 mL 04/27/2019 Intramuscular   Manufacturer: [employer]   Lot: [id number]   [place]: [id number]

## 2019-06-06 ENCOUNTER — Ambulatory Visit: Payer: Medicare Other

## 2019-06-06 ENCOUNTER — Ambulatory Visit
Admission: RE | Admit: 2019-06-06 | Discharge: 2019-06-06 | Disposition: A | Discharge status: Home / Self Care | Payer: Medicare Other | Source: Ambulatory Visit | Attending: Radiation Oncology | Admitting: Radiation Oncology

## 2019-06-06 ENCOUNTER — Other Ambulatory Visit: Payer: Self-pay

## 2019-06-06 DIAGNOSIS — Z510 Encounter for antineoplastic radiation therapy: Secondary | ICD-10-CM | POA: Diagnosis not present

## 2019-06-06 DIAGNOSIS — Z483 Aftercare following surgery for neoplasm: Secondary | ICD-10-CM

## 2019-06-06 DIAGNOSIS — R293 Abnormal posture: Secondary | ICD-10-CM

## 2019-06-06 DIAGNOSIS — Z170 Estrogen receptor positive status [ER+]: Secondary | ICD-10-CM

## 2019-06-06 DIAGNOSIS — I890 Lymphedema, not elsewhere classified: Secondary | ICD-10-CM

## 2019-06-06 DIAGNOSIS — M25612 Stiffness of left shoulder, not elsewhere classified: Secondary | ICD-10-CM

## 2019-06-06 DIAGNOSIS — C50512 Malignant neoplasm of lower-outer quadrant of left female breast: Secondary | ICD-10-CM | POA: Diagnosis not present

## 2019-06-06 NOTE — Therapy (Signed)
[place], [place], [address] Phone: [phone]   Fax:  [phone]  Physical Therapy Progress Note Progress Note Reporting Period 03/21/19 to 06/06/19  See note below for Objective Data and Assessment of Progress/Goals.       Patient Details  Name: Kayla Randolph MRN: [id number] Date of Birth: Mar 17, 1946 Referring Provider (PT): Dr. Erroll Luna   Encounter Date: 06/06/2019  PT End of Session - 06/06/19 1305    Visit Number  10    Number of Visits  10    Date for PT Re-Evaluation  07/11/19    PT Start Time  2761    PT Stop Time  1400    PT Time Calculation (min)  55 min    Activity Tolerance  Patient tolerated treatment well    Behavior During Therapy  [facility] for tasks assessed/performed       Past Medical History:  Diagnosis Date  . Allergy   . Anxiety   . Breast cancer ([place])   . Depression   . History of colon polyps   . Hyperlipidemia   . Hypothyroidism   . Interstitial cystitis    History of  . Osteopenia   . Vitamin D deficiency     Past Surgical History:  Procedure Laterality Date  . BREAST LUMPECTOMY WITH RADIOACTIVE SEED AND SENTINEL LYMPH NODE BIOPSY Left 04/03/2019   Procedure: LEFT BREAST LUMPECTOMY WITH RADIOACTIVE SEED AND SENTINEL LYMPH NODE MAPPING;  Surgeon: Erroll Luna, MD;  Location: [facility];  Service: General;  Laterality: Left;  . CESAREAN SECTION    . EYE SURGERY Right    cataract  . TONSILLECTOMY      There were no vitals filed for this visit.  Subjective Assessment - 06/06/19 1305    Subjective  Pt reports that she is still experiencing itching. She is not experiencing any pain at this time.    Patient is accompained by:  Family member    Pertinent History  Patient was diagnosed on 03/15/2019 with left invasive ductal carcinoma breast cancer. Patient underwent a left lumpectomy and sentinel node biopsy (2 negative nodes) on  04/03/2019. It is ER/PR positive and HER2 negative with a Ki67 of 30%.    Patient Stated Goals  See if my arm is doing ok    Currently in Pain?  No/denies    Pain Score  0-No pain         [facility] PT Assessment - 06/06/19 0001      AROM   Left Shoulder Flexion  152 Degrees   164 after STM    Left Shoulder ABduction  146 Degrees   150 after STM           Quick Dash - 06/06/19 0001    Open a tight or new jar  Moderate difficulty    Do heavy household chores (wash walls, wash floors)  Moderate difficulty    Carry a shopping bag or briefcase  Mild difficulty    Wash your back  No difficulty    Use a knife to cut food  No difficulty    Recreational activities in which you take some force or impact through your arm, shoulder, or hand (golf, hammering, tennis)  Mild difficulty    During the past week, to what extent has your arm, shoulder or hand problem interfered with your normal social activities with family, friends, neighbors, or groups?  Not at all    During the past  week, to what extent has your arm, shoulder or hand problem limited your work or other regular daily activities  Not at all    Arm, shoulder, or hand pain.  Mild    Tingling (pins and needles) in your arm, shoulder, or hand  None    Difficulty Sleeping  No difficulty    DASH Score  15.91 %             [facility] Adult PT Treatment/Exercise - 06/06/19 0001      Manual Therapy   Manual Therapy  Soft tissue mobilization;Passive ROM;Myofascial release;Manual Lymphatic Drainage (MLD)    Soft tissue mobilization  STM to the L uppertrapezius, deltoid, biceps and levator scap w/Tp in the L upper trapezius; decreased moderately following light STM    Myofascial Release  Longitudinal myofascial release in the L axillary area to L lateral breast into medial L brachium and cross friction over cording in the L axilla with crackling noted but no pop. Pt had increased A/ROm following Myofascial release    Manual Lymphatic  Drainage (MLD)  In supine: short neck, swimming in the terminus, Bil shoulders, Bil axillary and L inguinal nodes, L axillo-inguinal anastomosis, anterior inter-axillary anastomosis, superior breast toward anterior inter-axillary anastomosis then repeat this anastomosis, inferior L breast toward L axillo-inguinal anastomosis then repeat working this anastomosis, L shoulder, medial to lateral brachium, lateral brachium, all surfaces of L antebrachium, dorsum of L hand, re-worked all surfaces and then deep abdominals.     Passive ROM  L shoulder P'ROM into flexion and abduction 3x each with myofascial release.                   PT Long Term Goals - 06/06/19 1312      PT LONG TERM GOAL #1   Title  Patient will demonstrate she has regained full shoulder ROM and function post operatively compared to baseline assessment.    Baseline  Pt has not quite met her abduction measurements in A/ROM since pre-operatively.    Status  On-going      PT LONG TERM GOAL #2   Title  Patient will improve DASH score to 0 (zero) for improved overall arm function and to return to baseline score.    Baseline  15.91 06/06/19    Status  On-going      PT LONG TERM GOAL #3   Title  Patient will improve left shoulder active flexion to >/= 144 degrees for increased ease reaching overhead and to return to baseline.    Baseline  152 degrees flexion    Status  Achieved      PT LONG TERM GOAL #4   Title  Patient will improve left shoulder active abduction to >/= 160 degrees for increased ease reaching overhead and to return to baseline.    Baseline  146 degrees abd 06/06/19    Status  On-going      PT LONG TERM GOAL #5   Title  Patient will verbalize good understanding of lymphedema risk reduction practices.    Baseline  Pt is able to verbalize good understanding of lymphedem risk reduction practices including compression with wearing her sleeve, doing her exercises and self MLD.    Status  Achieved             Plan - 06/06/19 1304    Clinical Impression Statement  Pt has met most of her goals but continues to lack in L shoulder abduction and DASH score is still ~16%. Pt has  surpassed her L shoulder flexion goal and was able to name all components of CDT and how to incorporate this into her daily activities by doing exercises, wearing compression sleeve and performing MLD. Palpable tightness was noted at the L deltoid, upper trapezius, biceps and levator scapulae; decreased following light STM and TpR at the upper trapezius. P/ROM with myofascial release longitudinally from the medial brachium to the axilla into flexoin/abduction and abduction/external rotation with intermittent cross friction. Pt A/ROM improved by 12 degrees into L shoulder flexion and 4 degrees into L shoulder abduction following STM/myofascial release. Discussed correct direction for MLD including always away from the effected limb. Pt will benefit from continued physical therapy services 1x/week for every other week in order to ensure accurate home management for L shoulder ROM and edema.    PT Frequency  Biweekly    PT Duration  4 weeks    PT Treatment/Interventions  ADLs/Self Care Home Management;Patient/family education;Therapeutic exercise;Passive range of motion;Scar mobilization;Manual techniques;Manual lymph drainage;Therapeutic activities    PT Next Visit Plan  Scapular exercises or ABC class.    PT Home Exercise Plan  Post op shoulder ROM HEP and neural stretching; self MLD    Consulted and Agree with Plan of Care  Patient       Patient will benefit from skilled therapeutic intervention in order to improve the following deficits and impairments:  Postural dysfunction, Decreased range of motion, Impaired UE functional use, Pain, Decreased knowledge of precautions, Increased edema, Decreased scar mobility  Visit Diagnosis: Malignant neoplasm of lower-outer quadrant of left breast of female, estrogen receptor  positive (HCC)  Abnormal posture  Aftercare following surgery for neoplasm  Lymphedema, not elsewhere classified  Stiffness of left shoulder, not elsewhere classified     Problem List Patient Active Problem List   Diagnosis Date Noted  . Malignant neoplasm of upper-inner quadrant of left breast in female, estrogen receptor positive ([place]) 03/16/2019  . Primary osteoarthritis involving multiple joints 10/04/2017  . Hypothyroidism due to acquired atrophy of thyroid 05/31/2017  . Depression 04/03/2013  . Hyperlipemia 12/14/2012  . Vitamin D deficiency 12/14/2012  . Allergic rhinitis 12/14/2012  . 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 Osteopenia     Ander Purpura, PT 06/06/2019, 2:18 PM  [place], [place], [address] Phone: [phone]   Fax:  [phone]  Name: Kayla Randolph MRN: [id number] Date of Birth: 1945/07/24

## 2019-06-07 ENCOUNTER — Ambulatory Visit: Payer: Medicare Other

## 2019-06-07 ENCOUNTER — Other Ambulatory Visit: Payer: Self-pay

## 2019-06-07 ENCOUNTER — Ambulatory Visit
Admission: RE | Admit: 2019-06-07 | Discharge: 2019-06-07 | Disposition: A | Discharge status: Home / Self Care | Payer: Medicare Other | Source: Ambulatory Visit | Attending: Radiation Oncology | Admitting: Radiation Oncology

## 2019-06-07 DIAGNOSIS — Z510 Encounter for antineoplastic radiation therapy: Secondary | ICD-10-CM | POA: Diagnosis not present

## 2019-06-08 ENCOUNTER — Other Ambulatory Visit: Payer: Self-pay

## 2019-06-08 ENCOUNTER — Ambulatory Visit
Admission: RE | Admit: 2019-06-08 | Discharge: 2019-06-08 | Disposition: A | Discharge status: Home / Self Care | Payer: Medicare Other | Source: Ambulatory Visit | Attending: Radiation Oncology | Admitting: Radiation Oncology

## 2019-06-08 ENCOUNTER — Ambulatory Visit: Payer: Medicare Other

## 2019-06-08 DIAGNOSIS — Z510 Encounter for antineoplastic radiation therapy: Secondary | ICD-10-CM | POA: Diagnosis not present

## 2019-06-11 ENCOUNTER — Other Ambulatory Visit: Payer: Self-pay

## 2019-06-11 ENCOUNTER — Ambulatory Visit: Payer: Medicare Other

## 2019-06-11 ENCOUNTER — Ambulatory Visit
Admission: RE | Admit: 2019-06-11 | Discharge: 2019-06-11 | Disposition: A | Discharge status: Home / Self Care | Payer: Medicare Other | Source: Ambulatory Visit | Attending: Radiation Oncology | Admitting: Radiation Oncology

## 2019-06-11 ENCOUNTER — Encounter: Payer: Self-pay | Admitting: *Deleted

## 2019-06-11 ENCOUNTER — Encounter: Payer: Self-pay | Admitting: Radiation Oncology

## 2019-06-11 DIAGNOSIS — Z510 Encounter for antineoplastic radiation therapy: Secondary | ICD-10-CM | POA: Diagnosis not present

## 2019-06-12 ENCOUNTER — Ambulatory Visit: Payer: Medicare Other

## 2019-06-20 NOTE — Progress Notes (Signed)
[facility]  [phone] [phone]     ID: Kayla Randolph DOB: 20-Oct-1945  MR#: [id number]  [name]  Patient Care Team: [name], DO as PCP - General (Family Medicine) Jethro Bolus, MD as Consulting Physician (Ophthalmology) Vida Rigger, MD as Consulting Physician (Gastroenterology) Janalyn Harder, MD as Consulting Physician (Dermatology) Pershing Proud, RN as Oncology Nurse Navigator Donnelly Angelica, RN as Oncology Nurse Navigator Christal Lagerstrom, Valentino Hue, MD as Consulting Physician (Oncology) Jethro Bolus, MD as Consulting Physician (Ophthalmology) Dorothy Puffer, MD as Consulting Physician (Radiation Oncology) Harriette Bouillon, MD as Consulting Physician (General Surgery) Lowella Dell, MD OTHER MD:  CHIEF COMPLAINT: estrogen receptor positive breast cancer  CURRENT TREATMENT: Tamoxifen   INTERVAL HISTORY: Kayla Randolph returns today for follow up of her estrogen receptor positive breast cancer.  Her husband Ed participated by speaker phone.   She opted to proceed with left lumpectomy on 04/03/2019 under Dr. Luisa Hart. Pathology from the procedure [id number] [id number]) revealed: invasive ductal carcinoma, 2.2 cm, with ductal carcinoma in situ; margins not involved.  Two lymph nodes were biopsied, and both were benign (0/2).  Oncotype DX was obtained on the final surgical sample and the recurrence score of 21 predicts a risk of recurrence outside the breast over the next 9 years of 7%, if the patient's only systemic therapy is an antiestrogen for 5 years.  It also predicts no significant benefit from chemotherapy.  She was referred back to Dr. Mitzi Hansen for radiation therapy. She received treatment from 05/14/2019 though 06/11/2019.  Her most recent bone density screening was performed on 06/21/2018. This showed a T-score of -2.0, which is considered osteopenic.  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   REVIEW OF SYSTEMS: Kayla Randolph tells me that she did very well with her surgery, without any unusual bleeding or pain or fever.  She then went through radiation and aside from some skin color changes and mild fatigue she tolerated it well.  She did not have problems with desquamation.  She did have some itching.  She used the creams appropriately.  She is not exercising regularly right now except to walk between 2 and 3 miles on days when it is not too cold a detailed review of systems today was otherwise stable.   HISTORY OF CURRENT ILLNESS:    From the original intake note:   "Kayla Randolph" had routine screening mammography on 03/02/2019 showing a possible abnormality in the left breast. She underwent left diagnostic mammography with tomography and left breast ultrasonography at [facility] on 03/09/2019 showing: breast density category C; suspicious 1.1 cm left breast mass at 3:30 with associated punctate calcifications; no left axillary adenopathy.  Accordingly on 03/13/2019 she proceeded to biopsy of the left breast area in question. The pathology from this procedure ([id number]) showed: invasive ductal carcinoma, grade 2. Prognostic indicators significant for: estrogen receptor, 100% positive and progesterone receptor, 100% positive, both with strong staining intensity. Proliferation marker Ki67 at 30%. HER2 negative by immunohistochemistry (1+).  The patient's subsequent history is as detailed below.   PAST MEDICAL HISTORY: Past Medical History:  Diagnosis Date   Allergy    Anxiety    Breast cancer (HCC)    Depression    History of colon polyps    Hyperlipidemia    Hypothyroidism    Interstitial cystitis    History of   Osteopenia    Vitamin D deficiency     PAST  SURGICAL HISTORY: Past Surgical History:  Procedure Laterality Date   BREAST LUMPECTOMY WITH RADIOACTIVE SEED AND SENTINEL LYMPH NODE BIOPSY Left 04/03/2019   Procedure: LEFT BREAST LUMPECTOMY  WITH RADIOACTIVE SEED AND SENTINEL LYMPH NODE MAPPING;  Surgeon: Harriette Bouillon, MD;  Location: [facility];  Service: General;  Laterality: Left;   CESAREAN SECTION     EYE SURGERY Right    cataract   TONSILLECTOMY      FAMILY HISTORY: Family History  Problem Relation Age of Onset   Heart attack Father    Congestive Heart Failure Father    Osteoporosis Maternal Aunt    Uterine cancer Maternal Aunt    Cancer Mother        possible lung - spread    Early death Mother    Stroke Maternal Grandmother    Breast cancer Paternal Grandmother    Stroke Paternal Grandfather    Patient's father was 72 years old when he died from congestive heart failure. Patient's mother died from cancer, type unknown to the patient, at age 71. The patient denies a family hx of ovarian cancer. She reports a maternal aunt was diagnosed with uterine cancer (age unsure), and her paternal grandmother with breast cancer at a very young age (exact age unsure).  The patient has no siblings.   GYNECOLOGIC HISTORY:  No LMP recorded. Patient is postmenopausal. Menarche: [age] years old Age at first live birth: 74 years old GX P 2 LMP age [age] Contraceptive: yes, used pills on and off from age [age] without issue. HRT: yes, for about 5 years, until age 21 or 22.  Hysterectomy? no BSO? no   SOCIAL HISTORY: (updated 03/2019)  Kayla Randolph is currently retired from working as a Runner, broadcasting/film/video (fourth grade and substitute).  Husband Ed is retired Solicitor in the [employer].. She lives at home with Ed and 2 cats. Daughter [name], age 42, lives in [place] as a homemaker. Son [place], age 57, lives in [place], [place] as a IT trainer. She attends [employer] in [place]    ADVANCED DIRECTIVES: In the absence of any documentation to the contrary, the patient's spouse is their HCPOA.   HEALTH MAINTENANCE: Social History   Tobacco Use   Smoking status: Never Smoker   Smokeless tobacco: Never Used    Substance Use Topics   Alcohol use: Yes    Alcohol/week: 7.0 standard drinks    Types: 7 Standard drinks or equivalent per week    Comment: wine nightly   Drug use: No     Colonoscopy: 09/2011  PAP: 2019  Bone density: 06/2018, -2.0   Allergies  Allergen Reactions   Macrodantin    Sulfa Antibiotics    Wellbutrin [Bupropion Hcl]     Current Outpatient Medications  Medication Sig Dispense Refill   Black Pepper-Turmeric (TURMERIC COMPLEX/BLACK PEPPER PO) Take by mouth.     Cholecalciferol (VITAMIN D3) 1000 UNITS CAPS Take 2 capsules by mouth daily.      cycloSPORINE (RESTASIS) 0.05 % ophthalmic emulsion Place 1 drop into both eyes 2 (two) times daily.     escitalopram (LEXAPRO) 10 MG tablet TAKE (1) TABLET DAILY AS DIRECTED. 90 tablet 3   estradiol (ESTRACE) 0.1 MG/GM vaginal cream INSERT 1 VAGINALLY AT BEDTIME 127.5 g 1   fluticasone (FLONASE) 50 MCG/ACT nasal spray 1 SPRAY IN EACH NOSTRIL EVERY 12 HOURS 48 g 3   levothyroxine (SYNTHROID, LEVOTHROID) 25 MCG tablet Take 1 tablet (25 mcg total) by mouth daily before breakfast. 90 tablet  3   Multiple Vitamins-Minerals (WOMENS 50+ MULTI VITAMIN/MIN PO) Take by mouth.     Omega-3 Fatty Acids (SUPER OMEGA-3 PO) Take by mouth.     Polyethylene Glycol 3350 (MIRALAX PO) Take 1 packet by mouth daily as needed.       rosuvastatin (CRESTOR) 5 MG tablet TAKE (1) TABLET DAILY AS DIRECTED. 90 tablet 3   Specialty Vitamins Products (ONE-A-DAY BONE STRENGTH) 500-28-100 MG-MG-UNIT TABS Take 1 tablet by mouth 3 (three) times daily.     UNABLE TO FIND Med Name: Prelief Bladder     No current facility-administered medications for this visit.    OBJECTIVE: Middle-aged white woman in no acute distress  Vitals:   06/21/19 1220  BP: (!) 149/70  Pulse: 62  Resp: 18  Temp: 99.1 F (37.3 C)  SpO2: 100%     Body mass index is 19.07 kg/m.   Wt Readings from Last 3 Encounters:  06/21/19 114 lb 9.6 oz (52 kg)  04/25/19 114 lb  (51.7 kg)  04/03/19 113 lb 15.7 oz (51.7 kg)      ECOG FS:1 - Symptomatic but completely ambulatory  Sclerae unicteric, EOMs intact Wearing a mask No cervical or supraclavicular adenopathy Lungs no rales or rhonchi Heart regular rate and rhythm Abd soft, nontender, positive bowel sounds MSK no focal spinal tenderness, no upper extremity lymphedema Neuro: nonfocal, well oriented, appropriate affect Breasts: The right breast is benign.  The left breast is status post lumpectomy and radiation.  The cosmetic result is excellent.  There is erythema but not desquamation.  Both axillae are benign.  LAB RESULTS:  CMP     Component Value Date/Time   NA 141 03/21/2019 1221   NA 143 11/01/2018 0848   K 4.5 03/21/2019 1221   CL 102 03/21/2019 1221   CO2 29 03/21/2019 1221   GLUCOSE 158 (H) 03/21/2019 1221   BUN 17 03/21/2019 1221   BUN 15 11/01/2018 0848   CREATININE 0.84 03/21/2019 1221   CREATININE 0.69 07/24/2013 0836   CALCIUM 9.4 03/21/2019 1221   PROT 6.7 03/21/2019 1221   PROT 6.6 11/01/2018 0848   ALBUMIN 4.1 03/21/2019 1221   ALBUMIN 4.5 11/01/2018 0848   AST 23 03/21/2019 1221   ALT 16 03/21/2019 1221   ALKPHOS 46 03/21/2019 1221   BILITOT 0.5 03/21/2019 1221   GFRNONAA >60 03/21/2019 1221   GFRNONAA 89 07/24/2013 0836   GFRAA >60 03/21/2019 1221   GFRAA >89 07/24/2013 0836    No results found for: TOTALPROTELP, ALBUMINELP, A1GS, A2GS, BETS, BETA2SER, GAMS, MSPIKE, SPEI  No results found for: KPAFRELGTCHN, LAMBDASER, KAPLAMBRATIO  Lab Results  Component Value Date   WBC 4.9 06/21/2019   NEUTROABS 3.3 06/21/2019   HGB 14.8 06/21/2019   HCT 46.0 06/21/2019   MCV 94.5 06/21/2019   PLT 153 06/21/2019   No results found for: LABCA2  No components found for: YQMVHQ469  No results for input(s): INR in the last 168 hours.  No results found for: LABCA2  No results found for: GEX528  No results found for: UXL244  No results found for: WNU272  No results  found for: CA2729  No components found for: HGQUANT  No results found for: CEA1 / No results found for: CEA1   No results found for: AFPTUMOR  No results found for: CHROMOGRNA  No results found for: HGBA, HGBA2QUANT, HGBFQUANT, HGBSQUAN (Hemoglobinopathy evaluation)   No results found for: LDH  No results found for: IRON, TIBC, IRONPCTSAT (Iron and TIBC)  Lab Results  Component Value Date   FERRITIN 23 09/19/2012    Urinalysis    Component Value Date/Time   APPEARANCEUR Clear 02/15/2018 1532   GLUCOSEU Negative 02/15/2018 1532   BILIRUBINUR Negative 02/15/2018 1532   PROTEINUR Negative 02/15/2018 1532   UROBILINOGEN negative 12/02/2014 1128   NITRITE Negative 02/15/2018 1532   LEUKOCYTESUR Negative 02/15/2018 1532    STUDIES: No results found.    ELIGIBLE FOR AVAILABLE RESEARCH PROTOCOL: Speaker phone study  ASSESSMENT: 74 y.o. [place], [place] woman status post left breast upper inner quadrant biopsy 03/13/2019 for a clinical T1c N0, stage IA invasive ductal carcinoma, grade 2, estrogen and progesterone receptor positive, HER-2 not amplified, with an MIB-1 of 30%  (1) status post left lumpectomy 04/03/2019 for a pT1c pN0, stage IB invasive ductal carcinoma, grade 2, with negative margins  (a) a total of 2 left axillary lymph nodes were clear  (2) Oncotype score of 21 predicts a risk of recurrence outside the breast within the next 9 years of 7% if the patient's only systemic therapy is antiestrogens for 5 years.  It also predicts no benefit from chemotherapy.  (3) adjuvant radiation 05/14/2019 through 06/11/2019  (4) to start tamoxifen 07/02/2019  (a) bone density 06/22/2019 shows a T score of -2.0   PLAN:  Kayla Randolph has completed her local treatment and is now ready to start systemic therapy.  We reviewed her Oncotype results which are very favorable [place] she has a 93% chance of not having breast cancer recurrence outside the breast in the next 9 years if she  takes an antiestrogen for 5 years.  We discussed the difference between tamoxifen and anastrozole in detail. She understands that anastrozole and the aromatase inhibitors in general work by blocking estrogen production. Accordingly vaginal dryness, decrease in bone density, and of course hot flashes can result. The aromatase inhibitors can also negatively affect the cholesterol profile, although that is a minor effect. One out of 5 women on aromatase inhibitors we will feel "old and achy". This arthralgia/myalgia syndrome, which resembles fibromyalgia clinically, does resolve with stopping the medications. Accordingly this is not a reason to not try an aromatase inhibitor but it is a frequent reason to stop it (in other words 20% of women will not be able to tolerate these medications).  Tamoxifen on the other hand does not block estrogen production. It does not "take away a woman's estrogen". It blocks the estrogen receptor in breast cells. Like anastrozole, it can also cause hot flashes. As opposed to anastrozole, tamoxifen has many estrogen-like effects. It is technically an estrogen receptor modulator. This means that in some tissues tamoxifen works like estrogen-- for example it helps strengthen the bones. It tends to improve the cholesterol profile. It can cause thickening of the endometrial lining, and even endometrial polyps or rarely cancer of the uterus.(The risk of uterine cancer due to tamoxifen is one additional cancer per thousand women year). It can cause vaginal wetness or stickiness. It can cause blood clots through this estrogen-like effect--the risk of blood clots with tamoxifen is exactly the same as with birth control pills or hormone replacement.  Neither of these agents causes mood changes or weight gain, despite the popular belief that they can have these side effects. We have data from studies comparing either of these drugs with placebo, and in those cases the control group had the  same amount of weight gain and depression as the group that took the drug.  Because she took oral contraceptives  for many years with no clotting complications and because she has significant osteopenia I think tamoxifen is better for her.  In addition it would allow her to continue her vaginal Estrace cream safely, whereas she would have to stop this if she went on anastrozole.  I reassured her that there is no clinically meaningful interaction between Lexapro and tamoxifen.  Lexapro does not affect the tamoxifen level although it does affect the endoxiphen level.  She is interested in participating in our new AET study and is being consented by my physician's assistant.  I am going to see her again in approximately 3 months.  If all is working well she will proceed to mammography in October as before and we will follow her for 5 years  Total encounter time 35 minutes.Lowella Dell, MD   06/21/2019 12:36 PM Medical Oncology and Hematology [facility] [address] [place], [place] [address] Tel. [phone]    Fax. [phone]   This document serves as a record of services personally performed by Ruthann Cancer, MD. It was created on his behalf by Mickie Bail, a trained medical scribe. The creation of this record is based on the scribe's personal observations and the provider's statements to them.   I, Ruthann Cancer MD, have reviewed the above documentation for accuracy and completeness, and I agree with the above.   *Total Encounter Time as defined by the Centers for Medicare and Medicaid Services includes, in addition to the face-to-face time of a patient visit (documented in the note above) non-face-to-face time: obtaining and reviewing outside history, ordering and reviewing medications, tests or procedures, care coordination (communications with other health care professionals or caregivers) and documentation in the medical record.

## 2019-06-21 ENCOUNTER — Inpatient Hospital Stay: Payer: Medicare Other | Attending: Oncology

## 2019-06-21 ENCOUNTER — Other Ambulatory Visit: Payer: Self-pay

## 2019-06-21 ENCOUNTER — Inpatient Hospital Stay (HOSPITAL_BASED_OUTPATIENT_CLINIC_OR_DEPARTMENT_OTHER): Payer: Medicare Other | Admitting: Oncology

## 2019-06-21 VITALS — BP 149/70 | HR 62 | Temp 99.1°F | Resp 18 | Ht 65.0 in | Wt 114.6 lb

## 2019-06-21 DIAGNOSIS — E559 Vitamin D deficiency, unspecified: Secondary | ICD-10-CM

## 2019-06-21 DIAGNOSIS — G5692 Unspecified mononeuropathy of left upper limb: Secondary | ICD-10-CM

## 2019-06-21 DIAGNOSIS — M8580 Other specified disorders of bone density and structure, unspecified site: Secondary | ICD-10-CM | POA: Insufficient documentation

## 2019-06-21 DIAGNOSIS — M8080XS Other osteoporosis with current pathological fracture, unspecified site, sequela: Secondary | ICD-10-CM

## 2019-06-21 DIAGNOSIS — Z170 Estrogen receptor positive status [ER+]: Secondary | ICD-10-CM

## 2019-06-21 DIAGNOSIS — M4692 Unspecified inflammatory spondylopathy, cervical region: Secondary | ICD-10-CM | POA: Diagnosis not present

## 2019-06-21 DIAGNOSIS — C50212 Malignant neoplasm of upper-inner quadrant of left female breast: Secondary | ICD-10-CM | POA: Diagnosis present

## 2019-06-21 LAB — CMP (CANCER CENTER ONLY)
ALT: 18 U/L (ref 0–44)
AST: 23 U/L (ref 15–41)
Albumin: 4.2 g/dL (ref 3.5–5.0)
Alkaline Phosphatase: 48 U/L (ref 38–126)
Anion gap: 9 (ref 5–15)
BUN: 18 mg/dL (ref 8–23)
CO2: 29 mmol/L (ref 22–32)
Calcium: 9.3 mg/dL (ref 8.9–10.3)
Chloride: 105 mmol/L (ref 98–111)
Creatinine: 0.76 mg/dL (ref 0.44–1.00)
GFR, Est AFR Am: >60 mL/min (ref >60)
GFR, Estimated: >60 mL/min (ref >60)
Glucose, Bld: 84 mg/dL (ref 70–99)
Potassium: 4.3 mmol/L (ref 3.5–5.1)
Sodium: 143 mmol/L (ref 135–145)
Total Bilirubin: 0.4 mg/dL (ref 0.3–1.2)
Total Protein: 7 g/dL (ref 6.5–8.1)

## 2019-06-21 LAB — CBC WITH DIFFERENTIAL/PLATELET
Abs Immature Granulocytes: 0.02 K/uL (ref 0.00–0.07)
Basophils Absolute: 0 K/uL (ref 0.0–0.1)
Basophils Relative: 1 %
Eosinophils Absolute: 0.1 K/uL (ref 0.0–0.5)
Eosinophils Relative: 3 %
HCT: 46 % (ref 36.0–46.0)
Hemoglobin: 14.8 g/dL (ref 12.0–15.0)
Immature Granulocytes: 0 %
Lymphocytes Relative: 14 %
Lymphs Abs: 0.7 K/uL (ref 0.7–4.0)
MCH: 30.4 pg (ref 26.0–34.0)
MCHC: 32.2 g/dL (ref 30.0–36.0)
MCV: 94.5 fL (ref 80.0–100.0)
Monocytes Absolute: 0.7 K/uL (ref 0.1–1.0)
Monocytes Relative: 14 %
Neutro Abs: 3.3 K/uL (ref 1.7–7.7)
Neutrophils Relative %: 68 %
Platelets: 153 K/uL (ref 150–400)
RBC: 4.87 MIL/uL (ref 3.87–5.11)
RDW: 14.1 % (ref 11.5–15.5)
WBC: 4.9 K/uL (ref 4.0–10.5)
nRBC: 0 % (ref 0.0–0.2)

## 2019-06-21 LAB — VITAMIN D 25 HYDROXY (VIT D DEFICIENCY, FRACTURES): Vit D, 25-Hydroxy: 52.23 ng/mL (ref 30–100)

## 2019-06-21 MED ORDER — TAMOXIFEN CITRATE 20 MG PO TABS: 20.0000 mg | ORAL_TABLET | Freq: Every day | ORAL | 4 refills | Status: AC

## 2019-06-22 ENCOUNTER — Telehealth: Payer: Self-pay | Admitting: Oncology

## 2019-06-22 NOTE — Telephone Encounter (Signed)
I talk with patient regarding schedule  

## 2019-06-23 ENCOUNTER — Ambulatory Visit: Payer: Medicare Other

## 2019-06-25 ENCOUNTER — Other Ambulatory Visit: Payer: Self-pay

## 2019-06-25 ENCOUNTER — Ambulatory Visit: Payer: Medicare Other | Attending: Surgery

## 2019-06-25 DIAGNOSIS — M25612 Stiffness of left shoulder, not elsewhere classified: Secondary | ICD-10-CM | POA: Diagnosis present

## 2019-06-25 DIAGNOSIS — R293 Abnormal posture: Secondary | ICD-10-CM

## 2019-06-25 DIAGNOSIS — C50512 Malignant neoplasm of lower-outer quadrant of left female breast: Secondary | ICD-10-CM | POA: Diagnosis not present

## 2019-06-25 DIAGNOSIS — Z170 Estrogen receptor positive status [ER+]: Secondary | ICD-10-CM

## 2019-06-25 DIAGNOSIS — Z483 Aftercare following surgery for neoplasm: Secondary | ICD-10-CM | POA: Diagnosis present

## 2019-06-25 DIAGNOSIS — I890 Lymphedema, not elsewhere classified: Secondary | ICD-10-CM

## 2019-06-25 NOTE — Therapy (Addendum)
[place], [place], [address] Phone: [phone]   Fax:  [phone]  Physical Therapy Discharge Note  Patient Details  Name: Kayla Randolph MRN: [id number] Date of Birth: 22-Oct-1945 Referring Provider (PT): Dr. Erroll Luna   Encounter Date: 06/25/2019  PT End of Session - 06/25/19 1605    Visit Number  11    Number of Visits  10    Date for PT Re-Evaluation  07/11/19    PT Start Time  8329    PT Stop Time  1605    PT Time Calculation (min)  58 min    Activity Tolerance  Patient tolerated treatment well    Behavior During Therapy  [facility] for tasks assessed/performed       Past Medical History:  Diagnosis Date  . Allergy   . Anxiety   . Breast cancer ([place])   . Depression   . History of colon polyps   . Hyperlipidemia   . Hypothyroidism   . Interstitial cystitis    History of  . Osteopenia   . Vitamin D deficiency     Past Surgical History:  Procedure Laterality Date  . BREAST LUMPECTOMY WITH RADIOACTIVE SEED AND SENTINEL LYMPH NODE BIOPSY Left 04/03/2019   Procedure: LEFT BREAST LUMPECTOMY WITH RADIOACTIVE SEED AND SENTINEL LYMPH NODE MAPPING;  Surgeon: Erroll Luna, MD;  Location: [facility];  Service: General;  Laterality: Left;  . CESAREAN SECTION    . EYE SURGERY Right    cataract  . TONSILLECTOMY      There were no vitals filed for this visit.      [facility] PT Assessment - 06/25/19 0001      AROM   Left Shoulder Flexion  163 Degrees    Left Shoulder ABduction  163 Degrees           Quick Dash - 06/25/19 0001    Open a tight or new jar  No difficulty    Do heavy household chores (wash walls, wash floors)  No difficulty    Carry a shopping bag or briefcase  No difficulty    Wash your back  No difficulty    Use a knife to cut food  No difficulty    Recreational activities in which you take some force or impact through your arm, shoulder, or hand (golf,  hammering, tennis)  No difficulty    During the past week, to what extent has your arm, shoulder or hand problem interfered with your normal social activities with family, friends, neighbors, or groups?  Not at all    During the past week, to what extent has your arm, shoulder or hand problem limited your work or other regular daily activities  Not at all    Arm, shoulder, or hand pain.  None    Tingling (pins and needles) in your arm, shoulder, or hand  None    Difficulty Sleeping  Mild difficulty    DASH Score  2.27 %             [facility] Adult PT Treatment/Exercise - 06/25/19 0001      Self-Care   Self-Care  Other Self-Care Comments    Other Self-Care Comments   Spent alot of time at beginning of session reviewing with pt correct sequence of  MLD and her returning demo. She is able to verbalize good understanding and only required very mild cuing for correct sequence.      Shoulder Exercises: Supine  Horizontal ABduction  Strengthening;Both;5 reps;Theraband    Theraband Level (Shoulder Horizontal ABduction)  Level 2 (Red)    External Rotation  Strengthening;Both;5 reps;Theraband    Theraband Level (Shoulder External Rotation)  Level 2 (Red)    Flexion  Strengthening;Both;5 reps;Theraband   Narrow and Wide Grip, 5 times each   Theraband Level (Shoulder Flexion)  Level 2 (Red)    Diagonals  Strengthening;Right;Left;5 reps;Theraband    Theraband Level (Shoulder Diagonals)  Level 2 (Red)    Diagonals Weight (lbs)  Pt returned correct demo of each above exercise with tactile cues for correct UE position             PT Education - 06/25/19 1608    Education Details  Supine scapular series    Person(s) Educated  Patient    Methods  Explanation;Demonstration;Handout    Comprehension  Verbalized understanding;Returned demonstration;Tactile cues required          PT Long Term Goals - 06/25/19 1525      PT LONG TERM GOAL #1   Title  Patient will demonstrate she has  regained full shoulder ROM and function post operatively compared to baseline assessment.    Baseline  Pt has not quite met her abduction measurements in A/ROM since pre-operatively; pt has attained full A/ROM - 2/8/821    Status  Achieved      PT LONG TERM GOAL #2   Title  Patient will improve DASH score to 0 (zero) for improved overall arm function and to return to baseline score.    Baseline  15.91 06/06/19; 2.27 - 2/8/821    Status  Achieved      PT LONG TERM GOAL #3   Title  Patient will improve left shoulder active flexion to >/= 144 degrees for increased ease reaching overhead and to return to baseline.    Baseline  152 degrees flexion; 163 degrees - 06/25/19    Status  Achieved      PT LONG TERM GOAL #4   Title  Patient will improve left shoulder active abduction to >/= 160 degrees for increased ease reaching overhead and to return to baseline.    Baseline  146 degrees abd 06/06/19; 163 degrees - 06/25/19    Status  Achieved      PT LONG TERM GOAL #5   Title  Patient will verbalize good understanding of lymphedema risk reduction practices.    Baseline  Pt is able to verbalize good understanding of lymphedem risk reduction practices including compression with wearing her sleeve, doing her exercises and self MLD and has taken ABC class- 06/25/19    Status  Achieved            Plan - 06/25/19 1607    Clinical Impression Statement  Pt has now surpassed all goals. though she requested some review, she is able to verbalize and demonstrate correct sequence and pressure of manual lymph drainage. Added supine scapular series to her HEP for postural strength as she returned good demo. Pt is ready for D/C at this time.    Stability/Clinical Decision Making  Stable/Uncomplicated    Rehab Potential  Excellent    PT Frequency  Biweekly    PT Duration  4 weeks    PT Treatment/Interventions  ADLs/Self Care Home Management;Patient/family education;Therapeutic exercise;Passive range of motion;Scar  mobilization;Manual techniques;Manual lymph drainage;Therapeutic activities    PT Next Visit Plan  D/C this visit.    PT Home Exercise Plan  Post op shoulder ROM HEP and neural stretching;  self MLD; supine scapular series    Consulted and Agree with Plan of Care  Patient       Patient will benefit from skilled therapeutic intervention in order to improve the following deficits and impairments:  Postural dysfunction, Decreased range of motion, Impaired UE functional use, Pain, Decreased knowledge of precautions, Increased edema, Decreased scar mobility  Visit Diagnosis: Malignant neoplasm of lower-outer quadrant of left breast of female, estrogen receptor positive (HCC)  Abnormal posture  Aftercare following surgery for neoplasm  Lymphedema, not elsewhere classified  Stiffness of left shoulder, not elsewhere classified     Problem List Patient Active Problem List   Diagnosis Date Noted  . Unspecified inflammatory spondylopathy, cervical region ([place]) 06/21/2019  . Malignant neoplasm of upper-inner quadrant of left breast in female, estrogen receptor positive ([place]) 03/16/2019  . Primary osteoarthritis involving multiple joints 10/04/2017  . Hypothyroidism due to acquired atrophy of thyroid 05/31/2017  . Depression 04/03/2013  . Hyperlipemia 12/14/2012  . Vitamin D deficiency 12/14/2012  . Allergic rhinitis 12/14/2012  . 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 Osteopenia    PHYSICAL THERAPY DISCHARGE SUMMARY  Plan: Patient agrees to discharge.  Patient goals were met. Patient is being discharged due to meeting the stated rehab goals.  ?????      Otelia Limes, PTA 06/25/2019, 5:33 PM  Tomma Rakers, PT 06/26/19 8:04 AM   [place], [place], [address] Phone: [phone]   Fax:  [phone]  Name: JAIDYNN BALSTER MRN: [id number] Date of  Birth: [birth year]

## 2019-06-25 NOTE — Patient Instructions (Signed)

## 2019-06-28 ENCOUNTER — Ambulatory Visit: Payer: Medicare Other | Attending: Internal Medicine

## 2019-06-28 DIAGNOSIS — Z23 Encounter for immunization: Secondary | ICD-10-CM | POA: Insufficient documentation

## 2019-06-28 NOTE — Progress Notes (Signed)
   Covid-19 Vaccination Clinic  Name:  Kayla Randolph    MRN: [id number] DOB: 02/09/1946  06/28/2019  Kayla Randolph was observed post Covid-19 immunization for 15 minutes without incidence. She was provided with Vaccine Information Sheet and instruction to access the V-Safe system.   Kayla Randolph was instructed to call 911 with any severe reactions post vaccine: Marland Kitchen Difficulty breathing  . Swelling of your face and throat  . A fast heartbeat  . A bad rash all over your body  . Dizziness and weakness    Immunizations Administered    Name Date Dose VIS Date Route   Pfizer COVID-19 Vaccine 06/28/2019  3:28 PM 0.3 mL 04/27/2019 Intramuscular   Manufacturer: [place]   Lot: [id number]   [place]: [id number]

## 2019-07-03 ENCOUNTER — Telehealth: Payer: Self-pay | Admitting: Radiation Oncology

## 2019-07-03 NOTE — Telephone Encounter (Signed)
  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 During treatment she did very well with radiotherapy and did not have significant desquamation. She reports she is doing well and her skin.  Impression/Plan: 1. 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 The patient has been doing well since completion of radiotherapy. We discussed that we would be happy to continue to follow her as needed, but she will also continue to follow up with Dr. Jana Hakim in medical oncology. She was counseled on skin care as well as measures to avoid sun exposure to this area.  2. Survivorship. We discussed the importance of survivorship evaluation and encouraged her to attend her upcoming visit with that clinic.    Carola Rhine, PAC

## 2019-07-03 NOTE — Progress Notes (Signed)
  Radiation Oncology         [phone] ________________________________  Name: Kayla Randolph MRN: [id number]  Date: 06/11/2019  DOB: Oct 05, 1945  End of Treatment Note  Diagnosis:   left-sided breast cancer     Indication for treatment:  Curative       Radiation treatment dates:   05/14/19 - 06/11/19  Site/dose:   The patient initially received a dose of 42.56 Gy in 16 fractions to the breast using whole-breast tangent fields. This was delivered using a 3-D conformal technique. The patient then received a boost to the seroma. This delivered an additional 8 Gy in 6fractions using a 3 field photon technique due to the depth of the seroma. The total dose was 50.56 Gy.  Narrative: The patient tolerated radiation treatment relatively well.   The patient had some expected skin irritation as she progressed during treatment. Moist desquamation was not present at the end of treatment.  Plan: The patient has completed radiation treatment. The patient will return to radiation oncology clinic for routine followup in one month. I advised the patient to call or return sooner if they have any questions or concerns related to their recovery or treatment. ________________________________  Jodelle Gross, M.D., Ph.D.

## 2019-07-09 ENCOUNTER — Encounter (INDEPENDENT_AMBULATORY_CARE_PROVIDER_SITE_OTHER): Payer: Self-pay

## 2019-07-17 ENCOUNTER — Telehealth: Payer: Self-pay | Admitting: *Deleted

## 2019-07-17 ENCOUNTER — Other Ambulatory Visit: Payer: Self-pay | Admitting: Family Medicine

## 2019-07-17 NOTE — Telephone Encounter (Signed)
Spoke with patient regarding AET study.  She states she completed one on 2/22 but has not received anything else since.  Informed Mendel Ryder of this information.

## 2019-07-23 ENCOUNTER — Encounter (INDEPENDENT_AMBULATORY_CARE_PROVIDER_SITE_OTHER): Payer: Self-pay

## 2019-07-30 ENCOUNTER — Encounter (INDEPENDENT_AMBULATORY_CARE_PROVIDER_SITE_OTHER): Payer: Self-pay

## 2019-08-06 ENCOUNTER — Encounter (INDEPENDENT_AMBULATORY_CARE_PROVIDER_SITE_OTHER): Payer: Self-pay

## 2019-08-13 ENCOUNTER — Encounter (INDEPENDENT_AMBULATORY_CARE_PROVIDER_SITE_OTHER): Payer: Self-pay

## 2019-08-20 ENCOUNTER — Encounter (INDEPENDENT_AMBULATORY_CARE_PROVIDER_SITE_OTHER): Payer: Self-pay

## 2019-08-27 ENCOUNTER — Ambulatory Visit (INDEPENDENT_AMBULATORY_CARE_PROVIDER_SITE_OTHER): Payer: Medicare Other | Admitting: Family Medicine

## 2019-08-27 ENCOUNTER — Encounter: Payer: Self-pay | Admitting: Family Medicine

## 2019-08-27 ENCOUNTER — Other Ambulatory Visit: Payer: Self-pay

## 2019-08-27 ENCOUNTER — Encounter (INDEPENDENT_AMBULATORY_CARE_PROVIDER_SITE_OTHER): Payer: Self-pay

## 2019-08-27 VITALS — BP 110/59 | HR 62 | Temp 98.1°F | Ht 65.0 in | Wt 112.2 lb

## 2019-08-27 DIAGNOSIS — M7742 Metatarsalgia, left foot: Secondary | ICD-10-CM

## 2019-08-27 DIAGNOSIS — M545 Low back pain: Secondary | ICD-10-CM

## 2019-08-27 DIAGNOSIS — M4124 Other idiopathic scoliosis, thoracic region: Secondary | ICD-10-CM

## 2019-08-27 DIAGNOSIS — F329 Major depressive disorder, single episode, unspecified: Secondary | ICD-10-CM | POA: Diagnosis not present

## 2019-08-27 DIAGNOSIS — R102 Pelvic and perineal pain: Secondary | ICD-10-CM

## 2019-08-27 DIAGNOSIS — G8929 Other chronic pain: Secondary | ICD-10-CM

## 2019-08-27 DIAGNOSIS — F32A Depression, unspecified: Secondary | ICD-10-CM

## 2019-08-27 DIAGNOSIS — E034 Atrophy of thyroid (acquired): Secondary | ICD-10-CM

## 2019-08-27 LAB — URINALYSIS
Bilirubin, UA: NEGATIVE
Glucose, UA: NEGATIVE
Ketones, UA: NEGATIVE
Leukocytes,UA: NEGATIVE
Nitrite, UA: NEGATIVE
Protein,UA: NEGATIVE
RBC, UA: NEGATIVE
Specific Gravity, UA: 1.02 (ref 1.005–1.030)
Urobilinogen, Ur: 0.2 mg/dL (ref 0.2–1.0)
pH, UA: 8.5 — ABNORMAL HIGH (ref 5.0–7.5)

## 2019-08-27 MED ORDER — TIZANIDINE HCL 4 MG PO TABS: 2.0000 mg | ORAL_TABLET | Freq: Three times a day (TID) | ORAL | 0 refills | Status: DC | PRN

## 2019-08-27 NOTE — Patient Instructions (Signed)
Tylenol arthritis for pain.  Can use 3 times daily if needed  I have sent in Tizanidine for muscle relaxer/ pain.  This can cause sleepiness so use with caution.  Our next visit will be a pelvic exam.  You had labs performed today.  You will be contacted with the results of the labs once they are available, usually in the next 3 business days for routine lab work.  If you have an active my chart account, they will be released to your MyChart.  If you prefer to have these labs released to you via telephone, please let us know.

## 2019-08-27 NOTE — Progress Notes (Signed)
Subjective: CC: Establish care, right foot pain, hypothyroidism PCP: Janora Norlander, DO Kayla Randolph H Kayla Randolph is a 74 y.o. female presenting to clinic today for:  1.  Hypothyroidism Incidentally found on routine laboratory work-up.    She is compliant with Synthroid 25 mcg daily.  No heart palpitations, tremor, change in weight or bowel habit  2.  Hyperlipidemia Patient reports compliance with Crestor 5 mg daily.  No chest pain, shortness of breath, abdominal pain  3.  Low back pain Patient reports chronic degenerative changes in the low back.  She often will also have some symptoms in the neck and shoulders.  This is more prominent when she is under more stress.  She is wondering what medicine she can use besides Motrin, which seems to cause sleepiness.  Does not report any falls.  ROS: Per HPI  Allergies  Allergen Reactions  . Macrodantin   . Sulfa Antibiotics   . Wellbutrin [Bupropion Hcl]    Past Medical History:  Diagnosis Date  . Allergy   . Anxiety   . Breast cancer ([place])   . Depression   . History of colon polyps   . Hyperlipidemia   . Hypothyroidism   . Interstitial cystitis    History of  . Osteopenia   . Vitamin D deficiency     Current Outpatient Medications:  .  Black Pepper-Turmeric (TURMERIC COMPLEX/BLACK PEPPER PO), Take by mouth., Disp: , Rfl:  .  Cholecalciferol (VITAMIN D3) 1000 UNITS CAPS, Take 2 capsules by mouth daily. , Disp: , Rfl:  .  cycloSPORINE (RESTASIS) 0.05 % ophthalmic emulsion, Place 1 drop into both eyes 2 (two) times daily., Disp: , Rfl:  .  escitalopram (LEXAPRO) 10 MG tablet, TAKE (1) TABLET DAILY AS DIRECTED., Disp: 90 tablet, Rfl: 3 .  estradiol (ESTRACE) 0.1 MG/GM vaginal cream, INSERT 1 VAGINALLY AT BEDTIME, Disp: 127.5 g, Rfl: 1 .  fluticasone (FLONASE) 50 MCG/ACT nasal spray, 1 SPRAY IN EACH NOSTRIL EVERY 12 HOURS, Disp: 48 g, Rfl: 3 .  levothyroxine (SYNTHROID) 25 MCG tablet, TAKE 1 TABLET EVERY DAY BEFORE BREAKFAST, Disp:  90 tablet, Rfl: 0 .  Multiple Vitamins-Minerals (WOMENS 50+ MULTI VITAMIN/MIN PO), Take by mouth., Disp: , Rfl:  .  Omega-3 Fatty Acids (SUPER OMEGA-3 PO), Take by mouth., Disp: , Rfl:  .  Polyethylene Glycol 3350 (MIRALAX PO), Take 1 packet by mouth daily as needed.  , Disp: , Rfl:  .  rosuvastatin (CRESTOR) 5 MG tablet, TAKE (1) TABLET DAILY AS DIRECTED., Disp: 90 tablet, Rfl: 3 .  Specialty Vitamins Products (ONE-A-DAY BONE STRENGTH) 500-28-100 MG-MG-UNIT TABS, Take 1 tablet by mouth 3 (three) times daily., Disp: , Rfl:  .  tamoxifen (NOLVADEX) 20 MG tablet, Take 1 tablet (20 mg total) by mouth daily. Start 07/02/2019, Disp: 90 tablet, Rfl: 4 .  UNABLE TO FIND, Med Name: Prelief Bladder, Disp: , Rfl:  Social History   Socioeconomic History  . Marital status: Married    Spouse name: Ed  . Number of children: 2  . Years of education: 16  . Highest education level: Bachelor's degree (e.g., BA, AB, BS)  Occupational History  . Occupation: Retired    Comment: Geophysicist/field seismologist  Tobacco Use  . Smoking status: Never Smoker  . Smokeless tobacco: Never Used  Substance and Sexual Activity  . Alcohol use: Yes    Alcohol/week: 7.0 standard drinks    Types: 7 Standard drinks or equivalent per week    Comment: wine nightly  .  Drug use: No  . Sexual activity: Yes  Other Topics Concern  . Not on file  Social History Narrative   Lives with Ed   2 cats    2 children = neither local    Social Determinants of Health   Financial Resource Strain:   . Difficulty of Paying Living Expenses:   Food Insecurity:   . Worried About Charity fundraiser in the Last Year:   . Arboriculturist in the Last Year:   Transportation Needs:   . Film/video editor (Medical):   Marland Kitchen Lack of Transportation (Non-Medical):   Physical Activity:   . Days of Exercise per Week:   . Minutes of Exercise per Session:   Stress:   . Feeling of Stress :   Social Connections:   . Frequency of Communication with  Friends and Family:   . Frequency of Social Gatherings with Friends and Family:   . Attends Religious Services:   . Active Member of Clubs or Organizations:   . Attends Archivist Meetings:   Marland Kitchen Marital Status:   Intimate Partner Violence:   . Fear of Current or Ex-Partner:   . Emotionally Abused:   Marland Kitchen Physically Abused:   . Sexually Abused:    Family History  Problem Relation Age of Onset  . Heart attack Father   . Congestive Heart Failure Father   . Osteoporosis Maternal Aunt   . Uterine cancer Maternal Aunt   . Cancer Mother        possible lung - spread   . Early death Mother   . Stroke Maternal Grandmother   . Breast cancer Paternal Grandmother   . Stroke Paternal Grandfather     Objective: Office vital signs reviewed. BP (!) 110/59   Pulse 62   Temp 98.1 F (36.7 C)   Ht 5\' 5"  ([id number] m)   Wt 112 lb 3.2 oz (50.9 kg)   SpO2 99%   BMI 18.67 kg/m   Physical Examination:  General: Awake, alert, well nourished, No acute distress HEENT: Normal; no exophthalmos.  No goiter Cardio: regular rate and rhythm, S1S2 heard, no murmurs appreciated Pulm: clear to auscultation bilaterally, no wheezes, rhonchi or rales; normal work of breathing on room air Extremities: warm, well perfused, No edema, cyanosis or clubbing; +2 pulses bilaterally MSK: Normal gait and station  Lumbar spine: She has a scoliotic curve noted in the thoracolumbar spine.  There is no midline tenderness palpation.  No paraspinal muscle tenderness palpation. Skin: dry; intact; no rashes or lesions; normal temperature Neuro: no tremor  Assessment/ Plan: 74 y.o. female   1. Hypothyroidism due to acquired atrophy of thyroid Asymptomatic - Thyroid Panel With TSH  2. Depression, unspecified depression type Stable  3. Metatarsalgia, left foot Reinforced use of metatarsal pads  4. Other idiopathic scoliosis, thoracic region I have given her home physical therapy exercises to do.  Encouraged her  to use Tylenol arthritis.  I have also given her a muscle relaxer but caution sedation.- tiZANidine (ZANAFLEX) 4 MG tablet; Take 0.5-1 tablets (2-4 mg total) by mouth every 8 (eight) hours as needed for muscle spasms.  Dispense: 30 tablet; Refill: 0  5. Chronic bilateral low back pain without sciatica - tiZANidine (ZANAFLEX) 4 MG tablet; Take 0.5-1 tablets (2-4 mg total) by mouth every 8 (eight) hours as needed for muscle spasms.  Dispense: 30 tablet; Refill: 0  6. Pelvic pressure in female Check UA.  This was negative for evidence of  UTI - Urinalysis   No orders of the defined types were placed in this encounter.  No orders of the defined types were placed in this encounter.    Janora Norlander, DO [place] [phone]

## 2019-08-28 ENCOUNTER — Other Ambulatory Visit: Payer: Self-pay | Admitting: Family Medicine

## 2019-08-28 LAB — THYROID PANEL WITH TSH
Free Thyroxine Index: 1.3 (ref 1.2–4.9)
T3 Uptake Ratio: 21 % — ABNORMAL LOW (ref 24–39)
T4, Total: 6.4 ug/dL (ref 4.5–12.0)
TSH: 3.66 u[IU]/mL (ref 0.450–4.500)

## 2019-08-28 MED ORDER — LEVOTHYROXINE SODIUM 25 MCG PO TABS: ORAL_TABLET | ORAL | 3 refills | Status: DC

## 2019-09-03 ENCOUNTER — Encounter (INDEPENDENT_AMBULATORY_CARE_PROVIDER_SITE_OTHER): Payer: Self-pay

## 2019-09-07 ENCOUNTER — Ambulatory Visit (INDEPENDENT_AMBULATORY_CARE_PROVIDER_SITE_OTHER): Payer: Medicare Other | Admitting: Family Medicine

## 2019-09-07 ENCOUNTER — Other Ambulatory Visit: Payer: Self-pay

## 2019-09-07 ENCOUNTER — Encounter: Payer: Self-pay | Admitting: Family Medicine

## 2019-09-07 VITALS — BP 137/71 | HR 69 | Temp 98.2°F | Ht 65.0 in | Wt 111.6 lb

## 2019-09-07 DIAGNOSIS — Z170 Estrogen receptor positive status [ER+]: Secondary | ICD-10-CM

## 2019-09-07 DIAGNOSIS — Z01419 Encounter for gynecological examination (general) (routine) without abnormal findings: Secondary | ICD-10-CM

## 2019-09-07 DIAGNOSIS — Z124 Encounter for screening for malignant neoplasm of cervix: Secondary | ICD-10-CM | POA: Diagnosis not present

## 2019-09-07 DIAGNOSIS — Z01411 Encounter for gynecological examination (general) (routine) with abnormal findings: Secondary | ICD-10-CM

## 2019-09-07 DIAGNOSIS — C50212 Malignant neoplasm of upper-inner quadrant of left female breast: Secondary | ICD-10-CM

## 2019-09-07 NOTE — Progress Notes (Signed)
Subjective: CC: pap/ pelvic exam PCP: Janora Norlander, DO PX:[phone] Kayla Randolph is a 74 y.o. female presenting to clinic today for:  Patient here for Pap/pelvic exam.  She has no vaginal concerns today.  She does have history of hemorrhoid but does not report any rectal bleeding today.  She had a left-sided breast cancer status post lumpectomy last year.  She is followed by Dr. Jana Hakim for this.  She has an upcoming appointment and then think she will be released for yearly follow-ups.  She would like to go ahead and proceed with a breast exam today.  Currently on tamoxifen for treatment.   ROS: Per HPI.  Otherwise, complete ROS negative.  Allergies  Allergen Reactions  . Macrodantin   . Sulfa Antibiotics   . Wellbutrin [Bupropion Hcl]    Past Medical History:  Diagnosis Date  . Allergy   . Anxiety   . Breast cancer ([place])   . Cataract   . Depression   . History of colon polyps   . Hyperlipidemia   . Hypothyroidism   . Interstitial cystitis    History of  . Osteopenia   . S/P lumpectomy, left breast 04/03/2019  . Skin cancer    removed from left nostril  . Vitamin D deficiency     Current Outpatient Medications:  .  Black Pepper-Turmeric (TURMERIC COMPLEX/BLACK PEPPER PO), Take by mouth., Disp: , Rfl:  .  Cholecalciferol (VITAMIN D3) 1000 UNITS CAPS, Take 2 capsules by mouth daily. , Disp: , Rfl:  .  cycloSPORINE (RESTASIS) 0.05 % ophthalmic emulsion, Place 1 drop into both eyes 2 (two) times daily., Disp: , Rfl:  .  escitalopram (LEXAPRO) 10 MG tablet, TAKE (1) TABLET DAILY AS DIRECTED., Disp: 90 tablet, Rfl: 3 .  estradiol (ESTRACE) 0.1 MG/GM vaginal cream, INSERT 1 VAGINALLY AT BEDTIME, Disp: 127.5 g, Rfl: 1 .  fluticasone (FLONASE) 50 MCG/ACT nasal spray, 1 SPRAY IN EACH NOSTRIL EVERY 12 HOURS, Disp: 48 g, Rfl: 3 .  levothyroxine (SYNTHROID) 25 MCG tablet, TAKE 1 TABLET EVERY DAY BEFORE BREAKFAST, Disp: 90 tablet, Rfl: 3 .  Multiple Vitamins-Minerals (WOMENS 50+  MULTI VITAMIN/MIN PO), Take by mouth., Disp: , Rfl:  .  Omega-3 Fatty Acids (SUPER OMEGA-3 PO), Take by mouth., Disp: , Rfl:  .  Polyethylene Glycol 3350 (MIRALAX PO), Take 1 packet by mouth daily as needed.  , Disp: , Rfl:  .  rosuvastatin (CRESTOR) 5 MG tablet, TAKE (1) TABLET DAILY AS DIRECTED., Disp: 90 tablet, Rfl: 3 .  Specialty Vitamins Products (ONE-A-DAY BONE STRENGTH) 500-28-100 MG-MG-UNIT TABS, Take 1 tablet by mouth 3 (three) times daily., Disp: , Rfl:  .  tamoxifen (NOLVADEX) 20 MG tablet, Take 1 tablet (20 mg total) by mouth daily. Start 07/02/2019, Disp: 90 tablet, Rfl: 4 .  tiZANidine (ZANAFLEX) 4 MG tablet, Take 0.5-1 tablets (2-4 mg total) by mouth every 8 (eight) hours as needed for muscle spasms., Disp: 30 tablet, Rfl: 0 .  UNABLE TO FIND, Med Name: Prelief Bladder, Disp: , Rfl:  Social History   Socioeconomic History  . Marital status: Married    Spouse name: Ed  . Number of children: 2  . Years of education: 16  . Highest education level: Bachelor's degree (e.g., BA, AB, BS)  Occupational History  . Occupation: Retired    Comment: Geophysicist/field seismologist  Tobacco Use  . Smoking status: Never Smoker  . Smokeless tobacco: Never Used  Substance and Sexual Activity  . Alcohol use: Yes  Alcohol/week: 7.0 standard drinks    Types: 7 Standard drinks or equivalent per week    Comment: wine nightly  . Drug use: No  . Sexual activity: Yes  Other Topics Concern  . Not on file  Social History Narrative   Lives with Ed   2 cats    2 children = neither local    Social Determinants of Health   Financial Resource Strain:   . Difficulty of Paying Living Expenses:   Food Insecurity:   . Worried About Charity fundraiser in the Last Year:   . Arboriculturist in the Last Year:   Transportation Needs:   . Film/video editor (Medical):   Marland Kitchen Lack of Transportation (Non-Medical):   Physical Activity:   . Days of Exercise per Week:   . Minutes of Exercise per Session:    Stress:   . Feeling of Stress :   Social Connections:   . Frequency of Communication with Friends and Family:   . Frequency of Social Gatherings with Friends and Family:   . Attends Religious Services:   . Active Member of Clubs or Organizations:   . Attends Archivist Meetings:   Marland Kitchen Marital Status:   Intimate Partner Violence:   . Fear of Current or Ex-Partner:   . Emotionally Abused:   Marland Kitchen Physically Abused:   . Sexually Abused:    Family History  Problem Relation Age of Onset  . Heart attack Father   . Congestive Heart Failure Father   . Osteoporosis Maternal Aunt   . Uterine cancer Maternal Aunt   . Cancer Mother        possible lung - spread   . Early death Mother   . Stroke Maternal Grandmother   . Breast cancer Paternal Grandmother   . Stroke Paternal Grandfather     Objective: Office vital signs reviewed. BP 137/71   Pulse 69   Temp 98.2 F (36.8 C)   Ht 5\' 5"  (1.651 m)   Wt 111 lb 9.6 oz (50.6 kg)   SpO2 99%   BMI 18.57 kg/m   Physical Examination:  General: Awake, alert, well nourished, No acute distress HEENT: Normal    Neck: No masses palpated. No lymphadenopathy    Ears: Tympanic membranes intact, normal light reflex, no erythema, no bulging    Eyes: PERRLA, extraocular membranes intact, sclera white; evidence of prior cataract surgery noted    Nose: nasal turbinates moist, no nasal discharge    Throat: moist mucus membranes, no erythema, no tonsillar exudate.  Airway is patent Cardio: regular rate and rhythm, S1S2 heard, no murmurs appreciated Pulm: clear to auscultation bilaterally, no wheezes, rhonchi or rales; normal work of breathing on room air Breast: Left breast slightly larger than the right.  She does have some peau d' orange changes to the breast at the left inner/inferior aspect.  There is some scar tissue palpated along the inferior aspect of the left breast as well.  No lymphadenopathy within the axilla on either side.  No nipple  inversion or nipple discharge. GI: flat, soft, non-tender, non-distended, bowel sounds present x4, no hepatomegaly, no splenomegaly, no masses GU: external vaginal tissue atrophic, cervix midline, no punctate lesions on cervix appreciated, no discharge from cervical os, no bleeding, no cervical motion tenderness, no abdominal/ adnexal masses Extremities: warm, well perfused, No edema, cyanosis or clubbing; +2 pulses bilaterally MSK: normal gait and station Skin: dry; intact; no rashes.  Many pigmented nevi noted along  the back and upper extremities.  She has several seborrheic keratoses noted. Neuro: normal Strength and light touch sensation grossly intact, patellar DTRs 2/4 Psych: Mood stable, speech normal, affect appropriate, pleasant and interactive Depression screen [facility] 2/9 09/07/2019 08/27/2019 03/01/2019  Decreased Interest 0 0 0  Down, Depressed, Hopeless 0 0 1  PHQ - 2 Score 0 0 1  Altered sleeping - - 0  Tired, decreased energy - - 0  Change in appetite - - 0  Feeling bad or failure about yourself  - - 0  Trouble concentrating - - 0  Moving slowly or fidgety/restless - - 0  Suicidal thoughts - - 0  PHQ-9 Score - - 1    Assessment/ Plan: 74 y.o. female   1. Visit for pelvic exam Healthy exam  2. Screening for malignant neoplasm of cervix We discussed a cog recommendations for cervical cancer screening.  Her last was in 11/2014 and was normal.  Given her recent breast cancer I think that recheck is acceptable.  If negative, would likely not pursue ongoing screening for cervical cancer. - IGP, Aptima HPV  3. Malignant neoplasm of upper-inner quadrant of left breast in female, estrogen receptor positive ([employer]) Currently being managed by oncology.  Left breast with postoperative changes noted.   No orders of the defined types were placed in this encounter.  No orders of the defined types were placed in this encounter.    Janora Norlander, DO [place] [phone]

## 2019-09-07 NOTE — Patient Instructions (Signed)
You had labs performed today.  You will be contacted with the results of the labs once they are available, usually in the next 3 business days for routine lab work.  If you have an active my chart account, they will be released to your MyChart.  If you prefer to have these labs released to you via telephone, please let us know.  If you had a pap smear or biopsy performed, expect to be contacted in about 7-10 days.  The [employer] of Gynecology recommends pap smears be repeated every 3-5 years until the age of 76.  Given your recent breast cancer, we proceeded with repeat pap today.  If negative, unlikely that we need to continue this screening test.   Health Maintenance, Female Adopting a healthy lifestyle and getting preventive care are important in promoting health and wellness. Ask your health care provider about:  The right schedule for you to have regular tests and exams.  Things you can do on your own to prevent diseases and keep yourself healthy. What should I know about diet, weight, and exercise? Eat a healthy diet   Eat a diet that includes plenty of vegetables, fruits, low-fat dairy products, and lean protein.  Do not eat a lot of foods that are high in solid fats, added sugars, or sodium. Maintain a healthy weight Body mass index (BMI) is used to identify weight problems. It estimates body fat based on height and weight. Your health care provider can help determine your BMI and help you achieve or maintain a healthy weight. Get regular exercise Get regular exercise. This is one of the most important things you can do for your health. Most adults should:  Exercise for at least 150 minutes each week. The exercise should increase your heart rate and make you sweat (moderate-intensity exercise).  Do strengthening exercises at least twice a week. This is in addition to the moderate-intensity exercise.  Spend less time sitting. Even light physical activity can be  beneficial. Watch cholesterol and blood lipids Have your blood tested for lipids and cholesterol at 75 years of age, then have this test every 5 years. Have your cholesterol levels checked more often if:  Your lipid or cholesterol levels are high.  You are older than 75 years of age.  You are at high risk for heart disease. What should I know about cancer screening? Depending on your health history and family history, you may need to have cancer screening at various ages. This may include screening for:  Breast cancer.  Cervical cancer.  Colorectal cancer.  Skin cancer.  Lung cancer. What should I know about heart disease, diabetes, and high blood pressure? Blood pressure and heart disease  High blood pressure causes heart disease and increases the risk of stroke. This is more likely to develop in people who have high blood pressure readings, are of African descent, or are overweight.  Have your blood pressure checked: ? Every 3-5 years if you are 86-48 years of age. ? Every year if you are 39 years old or older. Diabetes Have regular diabetes screenings. This checks your fasting blood sugar level. Have the screening done:  Once every three years after age 42 if you are at a normal weight and have a low risk for diabetes.  More often and at a younger age if you are overweight or have a high risk for diabetes. What should I know about preventing infection? Hepatitis B If you have a higher risk for hepatitis B,  you should be screened for this virus. Talk with your health care provider to find out if you are at risk for hepatitis B infection. Hepatitis C Testing is recommended for:  Everyone born from [birth year] through 1966.  Anyone with known risk factors for hepatitis C. Sexually transmitted infections (STIs)  Get screened for STIs, including gonorrhea and chlamydia, if: ? You are sexually active and are younger than 75 years of age. ? You are older than 75 years of age and  your health care provider tells you that you are at risk for this type of infection. ? Your sexual activity has changed since you were last screened, and you are at increased risk for chlamydia or gonorrhea. Ask your health care provider if you are at risk.  Ask your health care provider about whether you are at high risk for HIV. Your health care provider may recommend a prescription medicine to help prevent HIV infection. If you choose to take medicine to prevent HIV, you should first get tested for HIV. You should then be tested every 3 months for as long as you are taking the medicine. Pregnancy  If you are about to stop having your period (premenopausal) and you may become pregnant, seek counseling before you get pregnant.  Take 400 to 800 micrograms (mcg) of folic acid every day if you become pregnant.  Ask for birth control (contraception) if you want to prevent pregnancy. Osteoporosis and menopause Osteoporosis is a disease in which the bones lose minerals and strength with aging. This can result in bone fractures. If you are 19 years old or older, or if you are at risk for osteoporosis and fractures, ask your health care provider if you should:  Be screened for bone loss.  Take a calcium or vitamin D supplement to lower your risk of fractures.  Be given hormone replacement therapy (HRT) to treat symptoms of menopause. Follow these instructions at home: Lifestyle  Do not use any products that contain nicotine or tobacco, such as cigarettes, e-cigarettes, and chewing tobacco. If you need help quitting, ask your health care provider.  Do not use street drugs.  Do not share needles.  Ask your health care provider for help if you need support or information about quitting drugs. Alcohol use  Do not drink alcohol if: ? Your health care provider tells you not to drink. ? You are pregnant, may be pregnant, or are planning to become pregnant.  If you drink alcohol: ? Limit how  much you use to 0-1 drink a day. ? Limit intake if you are breastfeeding.  Be aware of how much alcohol is in your drink. In the [place] one drink equals one 12 oz bottle of beer (355 mL), one 5 oz glass of wine (148 mL), or one 1 oz glass of hard liquor (44 mL). General instructions  Schedule regular health, dental, and eye exams.  Stay current with your vaccines.  Tell your health care provider if: ? You often feel depressed. ? You have ever been abused or do not feel safe at home. Summary  Adopting a healthy lifestyle and getting preventive care are important in promoting health and wellness.  Follow your health care provider's instructions about healthy diet, exercising, and getting tested or screened for diseases.  Follow your health care provider's instructions on monitoring your cholesterol and blood pressure. This information is not intended to replace advice given to you by your health care provider. Make sure you discuss any questions you have  with your health care provider. Document Revised: 04/26/2018 Document Reviewed: 04/26/2018 Elsevier Patient Education  2020 [employer].

## 2019-09-10 ENCOUNTER — Encounter (INDEPENDENT_AMBULATORY_CARE_PROVIDER_SITE_OTHER): Payer: Self-pay

## 2019-09-11 LAB — IGP, APTIMA HPV: HPV Aptima: NEGATIVE

## 2019-09-17 ENCOUNTER — Encounter (INDEPENDENT_AMBULATORY_CARE_PROVIDER_SITE_OTHER): Payer: Self-pay

## 2019-09-18 LAB — EXTERNAL GENERIC LAB PROCEDURE: COLOGUARD: POSITIVE — AB

## 2019-09-18 LAB — COLOGUARD: COLOGUARD: POSITIVE — AB

## 2019-09-21 ENCOUNTER — Encounter: Payer: Self-pay | Admitting: Family Medicine

## 2019-09-21 ENCOUNTER — Other Ambulatory Visit: Payer: Self-pay | Admitting: Family Medicine

## 2019-09-21 DIAGNOSIS — R195 Other fecal abnormalities: Secondary | ICD-10-CM

## 2019-09-21 LAB — COLOGUARD: Cologuard: POSITIVE — AB

## 2019-09-24 ENCOUNTER — Encounter (INDEPENDENT_AMBULATORY_CARE_PROVIDER_SITE_OTHER): Payer: Self-pay

## 2019-10-01 ENCOUNTER — Encounter (INDEPENDENT_AMBULATORY_CARE_PROVIDER_SITE_OTHER): Payer: Self-pay

## 2019-10-01 NOTE — Progress Notes (Signed)
[place]  Telephone:(336) [phone] Fax:(336) [phone]     ID: Kayla Randolph DOB: 06-Jul-1945  MR#: [id number]  YBO#:[id number]  Patient Care Team: Janora Norlander, DO as PCP - General (Family Medicine) Rutherford Guys, MD as Consulting Physician (Ophthalmology) Clarene Essex, MD as Consulting Physician (Gastroenterology) Lavonna Monarch, MD as Consulting Physician (Dermatology) Mauro Kaufmann, RN as Oncology Nurse Navigator Rockwell Germany, RN as Oncology Nurse Navigator Magrinat, Virgie Dad, MD as Consulting Physician (Oncology) Rutherford Guys, MD as Consulting Physician (Ophthalmology) Kyung Rudd, MD as Consulting Physician (Radiation Oncology) Erroll Luna, MD as Consulting Physician (General Surgery) Chauncey Cruel, MD OTHER MD:  CHIEF COMPLAINT: estrogen receptor positive breast cancer  CURRENT TREATMENT: Tamoxifen   INTERVAL HISTORY: Kayla Randolph returns today for follow up of her estrogen receptor positive breast cancer.   She started tamoxifen on 07/02/2019.  She is tolerating this remarkably well.  She has no hot flashes and no vaginal wetness.  She thinks she paid close to $20 a month for this but is not sure.  She understands this usually considerably less  She underwent Cologuard testing on 09/12/2019, which came back positive.  She has a call out to Dr. 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  When she moves her left arm up it pulls in an unpleasant fashion.  She had stopped doing her stretching exercises because her arm had such a wonderful range of motion.  There has not been any swelling or erythema.  Aside from these issues a detailed review of systems today was benign.   HISTORY OF CURRENT ILLNESS:    From the original intake note:   "Kayla Randolph" had routine screening mammography on 03/02/2019 showing  a possible abnormality in the left breast. She underwent left diagnostic mammography with tomography and left breast ultrasonography at [facility] on 03/09/2019 showing: breast density category C; suspicious 1.1 cm left breast mass at 3:30 with associated punctate calcifications; no left axillary adenopathy.  Accordingly on 03/13/2019 she proceeded to biopsy of the left breast area in question. The pathology from this procedure ([id number]) showed: invasive ductal carcinoma, grade 2. Prognostic indicators significant for: estrogen receptor, 100% positive and progesterone receptor, 100% positive, both with strong staining intensity. Proliferation marker Ki67 at 30%. HER2 negative by immunohistochemistry (1+).  The patient's subsequent history is as detailed below.   PAST MEDICAL HISTORY: Past Medical History:  Diagnosis Date  . Allergy   . Anxiety   . Breast cancer ([place])   . Cataract   . Depression   . History of colon polyps   . Hyperlipidemia   . Hypothyroidism   . Interstitial cystitis    History of  . Osteopenia   . S/P lumpectomy, left breast 04/03/2019  . Skin cancer    removed from left nostril  . Vitamin D deficiency     PAST SURGICAL HISTORY: Past Surgical History:  Procedure Laterality Date  . BREAST LUMPECTOMY WITH RADIOACTIVE SEED AND SENTINEL LYMPH NODE BIOPSY Left 04/03/2019   Procedure: LEFT BREAST LUMPECTOMY WITH RADIOACTIVE SEED AND SENTINEL LYMPH NODE MAPPING;  Surgeon: Erroll Luna, MD;  Location: [facility];  Service: General;  Laterality: Left;  . CESAREAN SECTION    . EYE SURGERY Right    cataract  . TONSILLECTOMY  FAMILY HISTORY: Family History  Problem Relation Age of Onset  . Heart attack Father   . Congestive Heart Failure Father   . Osteoporosis Maternal Aunt   . Uterine cancer Maternal Aunt   . Cancer Mother        possible lung - spread   . Early death Mother   . Stroke Maternal Grandmother   . Breast cancer  Paternal Grandmother   . Stroke Paternal Grandfather    Patient's father was 65 years old when he died from congestive heart failure. Patient's mother died from cancer, type unknown to the patient, at age 20. The patient denies a family hx of ovarian cancer. She reports a maternal aunt was diagnosed with uterine cancer (age unsure), and her paternal grandmother with breast cancer at a very young age (exact age unsure).  The patient has no siblings.   GYNECOLOGIC HISTORY:  No LMP recorded. Patient is postmenopausal. Menarche: [age] years old Age at first live birth: 74 years old GX P 2 LMP age [age] Contraceptive: yes, used pills on and off from age [age] without issue. HRT: yes, for about 5 years, until age 40 or 67.  Hysterectomy? no BSO? no   SOCIAL HISTORY: (updated 03/2019)  Kayla Randolph is currently retired from working as a Pharmacist, hospital (fourth grade and substitute).  Husband Ed is retired Engineer, building services in the [employer].. She lives at home with Ed and 2 cats. Daughter Santiago Glad, age 1, lives in [place] as a homemaker. Son [place], age 19, lives in [place], [place] as a Engineer, maintenance (IT). She attends [employer] in Person: In the absence of any documentation to the contrary, the patient's spouse is their HCPOA.   HEALTH MAINTENANCE: Social History   Tobacco Use  . Smoking status: Never Smoker  . Smokeless tobacco: Never Used  Substance Use Topics  . Alcohol use: Yes    Alcohol/week: 7.0 standard drinks    Types: 7 Standard drinks or equivalent per week    Comment: wine nightly  . Drug use: No     Colonoscopy: 09/2011  PAP: 2019  Bone density: 06/2018, -2.0   Allergies  Allergen Reactions  . Macrodantin   . Sulfa Antibiotics   . Wellbutrin [Bupropion Hcl]     Current Outpatient Medications  Medication Sig Dispense Refill  . Black Pepper-Turmeric (TURMERIC COMPLEX/BLACK PEPPER PO) Take by mouth.    . Cholecalciferol (VITAMIN D3) 1000 UNITS CAPS Take 2 capsules  by mouth daily.     . cycloSPORINE (RESTASIS) 0.05 % ophthalmic emulsion Place 1 drop into both eyes 2 (two) times daily.    Marland Kitchen escitalopram (LEXAPRO) 10 MG tablet TAKE (1) TABLET DAILY AS DIRECTED. 90 tablet 3  . estradiol (ESTRACE) 0.1 MG/GM vaginal cream INSERT 1 VAGINALLY AT BEDTIME 127.5 g 1  . fluticasone (FLONASE) 50 MCG/ACT nasal spray 1 SPRAY IN EACH NOSTRIL EVERY 12 HOURS 48 g 3  . levothyroxine (SYNTHROID) 25 MCG tablet TAKE 1 TABLET EVERY DAY BEFORE BREAKFAST 90 tablet 3  . Multiple Vitamins-Minerals (WOMENS 50+ MULTI VITAMIN/MIN PO) Take by mouth.    . Omega-3 Fatty Acids (SUPER OMEGA-3 PO) Take by mouth.    . Polyethylene Glycol 3350 (MIRALAX PO) Take 1 packet by mouth daily as needed.      . rosuvastatin (CRESTOR) 5 MG tablet TAKE (1) TABLET DAILY AS DIRECTED. 90 tablet 3  . Specialty Vitamins Products (ONE-A-DAY BONE STRENGTH) 500-28-100 MG-MG-UNIT TABS Take 1 tablet by mouth 3 (three) times daily.    Marland Kitchen  tiZANidine (ZANAFLEX) 4 MG tablet Take 0.5-1 tablets (2-4 mg total) by mouth every 8 (eight) hours as needed for muscle spasms. 30 tablet 0  . UNABLE TO FIND Med Name: Prelief Bladder     No current facility-administered medications for this visit.    OBJECTIVE: white woman in no acute distress  Vitals:   10/02/19 1136  BP: (!) 141/73  Pulse: 63  Resp: 16  Temp: 98.9 F (37.2 C)  SpO2: 100%     Body mass index is 18.87 kg/m.   Wt Readings from Last 3 Encounters:  10/02/19 113 lb 6.4 oz (51.4 kg)  09/07/19 111 lb 9.6 oz (50.6 kg)  08/27/19 112 lb 3.2 oz (50.9 kg)      ECOG FS:1 - Symptomatic but completely ambulatory  Sclerae unicteric, EOMs intact Wearing a mask No cervical or supraclavicular adenopathy Lungs no rales or rhonchi Heart regular rate and rhythm Abd soft, nontender, positive bowel sounds MSK no focal spinal tenderness, no upper extremity lymphedema Neuro: nonfocal, well oriented, appropriate affect Breasts: The right breast is unremarkable the  left.  The left breast is status post lumpectomy followed by radiation with no evidence of local recurrence.  Both axillae are benign.   LAB RESULTS:  CMP     Component Value Date/Time   NA 143 06/21/2019 1204   NA 143 11/01/2018 0848   K 4.3 06/21/2019 1204   CL 105 06/21/2019 1204   CO2 29 06/21/2019 1204   GLUCOSE 84 06/21/2019 1204   BUN 18 06/21/2019 1204   BUN 15 11/01/2018 0848   CREATININE 0.76 06/21/2019 1204   CREATININE 0.69 07/24/2013 0836   CALCIUM 9.3 06/21/2019 1204   PROT 7.0 06/21/2019 1204   PROT 6.6 11/01/2018 0848   ALBUMIN 4.2 06/21/2019 1204   ALBUMIN 4.5 11/01/2018 0848   AST 23 06/21/2019 1204   ALT 18 06/21/2019 1204   ALKPHOS 48 06/21/2019 1204   BILITOT 0.4 06/21/2019 1204   GFRNONAA >60 06/21/2019 1204   GFRNONAA 89 07/24/2013 0836   GFRAA >60 06/21/2019 1204   GFRAA >89 07/24/2013 0836    No results found for: TOTALPROTELP, ALBUMINELP, A1GS, A2GS, BETS, BETA2SER, GAMS, MSPIKE, SPEI  No results found for: KPAFRELGTCHN, LAMBDASER, KAPLAMBRATIO  Lab Results  Component Value Date   WBC 5.5 10/02/2019   NEUTROABS 3.9 10/02/2019   HGB 14.3 10/02/2019   HCT 44.8 10/02/2019   MCV 97.4 10/02/2019   PLT 155 10/02/2019   No results found for: LABCA2  No components found for: WVPXTG626  No results for input(s): INR in the last 168 hours.  No results found for: LABCA2  No results found for: RSW546  No results found for: EVO350  No results found for: KXF818  No results found for: CA2729  No components found for: HGQUANT  No results found for: CEA1 / No results found for: CEA1   No results found for: AFPTUMOR  No results found for: CHROMOGRNA  No results found for: HGBA, HGBA2QUANT, HGBFQUANT, HGBSQUAN (Hemoglobinopathy evaluation)   No results found for: LDH  No results found for: IRON, TIBC, IRONPCTSAT (Iron and TIBC)  Lab Results  Component Value Date   FERRITIN 23 09/19/2012    Urinalysis    Component Value  Date/Time   APPEARANCEUR Clear 08/27/2019 0947   GLUCOSEU Negative 08/27/2019 0947   BILIRUBINUR Negative 08/27/2019 0947   PROTEINUR Negative 08/27/2019 0947   UROBILINOGEN negative 12/02/2014 1128   NITRITE Negative 08/27/2019 0947   LEUKOCYTESUR Negative 08/27/2019 0947  STUDIES: No results found.    ELIGIBLE FOR AVAILABLE RESEARCH PROTOCOL: Speaker phone study  ASSESSMENT: 74 y.o. [place], [place] woman status post left breast upper inner quadrant biopsy 03/13/2019 for a clinical T1c N0, stage IA invasive ductal carcinoma, grade 2, estrogen and progesterone receptor positive, HER-2 not amplified, with an MIB-1 of 30%  (1) status post left lumpectomy 04/03/2019 for a pT1c pN0, stage IB invasive ductal carcinoma, grade 2, with negative margins  (a) a total of 2 left axillary lymph nodes were clear  (2) Oncotype score of 21 predicts a risk of recurrence outside the breast within the next 9 years of 7% if the patient's only systemic therapy is antiestrogens for 5 years.  It also predicts no benefit from chemotherapy.  (3) adjuvant radiation 05/14/2019 through 06/11/2019  (4) started tamoxifen 07/02/2019  (a) bone density 06/22/2019 shows a T score of -2.0  (b) on Estrace vaginal suppositories   PLAN:  Kayla Randolph is now half year out from definitive surgery for her breast cancer with no evidence of disease recurrence.  This is very favorable.  She is tolerating tamoxifen well and the plan will be to continue that a minimum of 5 years.  She continues on vaginal Estrace.  This is quite safe so long she continues on tamoxifen.  It appears to be working well for her  I reassured her that the discomfort she is feeling with range of motion in her left upper extremity is due to a cord, very common, not related to cancer but it is due to scar tissue.  She really does need to forever do the range of motion exercises.  I think she would benefit from a review with physical therapy and I have entered  that order.  She is extremely concerned of course because of her positive Cologuard.  She has already contacted Dr. Christia Reading office and is hoping for a call back soon so she can get that evaluated appropriately  Otherwise she will see me again in November.  At that point assuming all goes well I will probably start seeing her on a once a year basis  Total encounter time 25 minutes.Chauncey Cruel, MD   10/02/2019 11:43 AM Medical Oncology and Hematology [facility] [place], [place] [address] Tel. [phone]    Fax. [phone]   This document serves as a record of services personally performed by Lurline Del, MD. It was created on his behalf by Wilburn Mylar, a trained medical scribe. The creation of this record is based on the scribe's personal observations and the provider's statements to them.   I, Lurline Del MD, have reviewed the above documentation for accuracy and completeness, and I agree with the above.   *Total Encounter Time as defined by the Centers for Medicare and Medicaid Services includes, in addition to the face-to-face time of a patient visit (documented in the note above) non-face-to-face time: obtaining and reviewing outside history, ordering and reviewing medications, tests or procedures, care coordination (communications with other health care professionals or caregivers) and documentation in the medical record.

## 2019-10-02 ENCOUNTER — Encounter (INDEPENDENT_AMBULATORY_CARE_PROVIDER_SITE_OTHER): Payer: Self-pay

## 2019-10-02 ENCOUNTER — Inpatient Hospital Stay: Payer: Medicare Other

## 2019-10-02 ENCOUNTER — Other Ambulatory Visit: Payer: Self-pay

## 2019-10-02 ENCOUNTER — Inpatient Hospital Stay: Payer: Medicare Other | Attending: Oncology | Admitting: Oncology

## 2019-10-02 VITALS — BP 141/73 | HR 63 | Temp 98.9°F | Resp 16 | Ht 65.0 in | Wt 113.4 lb

## 2019-10-02 DIAGNOSIS — Z79810 Long term (current) use of selective estrogen receptor modulators (SERMs): Secondary | ICD-10-CM | POA: Diagnosis not present

## 2019-10-02 DIAGNOSIS — C50212 Malignant neoplasm of upper-inner quadrant of left female breast: Secondary | ICD-10-CM

## 2019-10-02 DIAGNOSIS — E559 Vitamin D deficiency, unspecified: Secondary | ICD-10-CM

## 2019-10-02 DIAGNOSIS — Z170 Estrogen receptor positive status [ER+]: Secondary | ICD-10-CM | POA: Diagnosis not present

## 2019-10-02 DIAGNOSIS — G5692 Unspecified mononeuropathy of left upper limb: Secondary | ICD-10-CM

## 2019-10-02 LAB — CBC WITH DIFFERENTIAL/PLATELET
Abs Immature Granulocytes: 0.02 10*3/uL (ref 0.00–0.07)
Basophils Absolute: 0 10*3/uL (ref 0.0–0.1)
Basophils Relative: 1 %
Eosinophils Absolute: 0.1 10*3/uL (ref 0.0–0.5)
Eosinophils Relative: 2 %
HCT: 44.8 % (ref 36.0–46.0)
Hemoglobin: 14.3 g/dL (ref 12.0–15.0)
Immature Granulocytes: 0 %
Lymphocytes Relative: 14 %
Lymphs Abs: 0.8 10*3/uL (ref 0.7–4.0)
MCH: 31.1 pg (ref 26.0–34.0)
MCHC: 31.9 g/dL (ref 30.0–36.0)
MCV: 97.4 fL (ref 80.0–100.0)
Monocytes Absolute: 0.7 10*3/uL (ref 0.1–1.0)
Monocytes Relative: 12 %
Neutro Abs: 3.9 10*3/uL (ref 1.7–7.7)
Neutrophils Relative %: 71 %
Platelets: 155 10*3/uL (ref 150–400)
RBC: 4.6 MIL/uL (ref 3.87–5.11)
RDW: 14.5 % (ref 11.5–15.5)
WBC: 5.5 10*3/uL (ref 4.0–10.5)
nRBC: 0 % (ref 0.0–0.2)

## 2019-10-02 LAB — COMPREHENSIVE METABOLIC PANEL
ALT: 15 U/L (ref 0–44)
AST: 23 U/L (ref 15–41)
Albumin: 3.6 g/dL (ref 3.5–5.0)
Alkaline Phosphatase: 34 U/L — ABNORMAL LOW (ref 38–126)
Anion gap: 8 (ref 5–15)
BUN: 16 mg/dL (ref 8–23)
CO2: 30 mmol/L (ref 22–32)
Calcium: 8.9 mg/dL (ref 8.9–10.3)
Chloride: 104 mmol/L (ref 98–111)
Creatinine, Ser: 0.72 mg/dL (ref 0.44–1.00)
GFR calc Af Amer: >60 mL/min (ref >60)
GFR calc non Af Amer: >60 mL/min (ref >60)
Glucose, Bld: 84 mg/dL (ref 70–99)
Potassium: 4.4 mmol/L (ref 3.5–5.1)
Sodium: 142 mmol/L (ref 135–145)
Total Bilirubin: 0.4 mg/dL (ref 0.3–1.2)
Total Protein: 6.5 g/dL (ref 6.5–8.1)

## 2019-10-02 MED ORDER — TAMOXIFEN CITRATE 20 MG PO TABS: 20.0000 mg | ORAL_TABLET | Freq: Every day | ORAL | 12 refills | Status: AC

## 2019-10-03 ENCOUNTER — Telehealth: Payer: Self-pay | Admitting: Oncology

## 2019-10-03 ENCOUNTER — Ambulatory Visit: Payer: Medicare Other | Attending: Oncology

## 2019-10-03 DIAGNOSIS — M25612 Stiffness of left shoulder, not elsewhere classified: Secondary | ICD-10-CM

## 2019-10-03 DIAGNOSIS — Z483 Aftercare following surgery for neoplasm: Secondary | ICD-10-CM | POA: Diagnosis present

## 2019-10-03 DIAGNOSIS — C50512 Malignant neoplasm of lower-outer quadrant of left female breast: Secondary | ICD-10-CM

## 2019-10-03 DIAGNOSIS — Z170 Estrogen receptor positive status [ER+]: Secondary | ICD-10-CM | POA: Insufficient documentation

## 2019-10-03 DIAGNOSIS — I890 Lymphedema, not elsewhere classified: Secondary | ICD-10-CM | POA: Diagnosis present

## 2019-10-03 DIAGNOSIS — R293 Abnormal posture: Secondary | ICD-10-CM | POA: Diagnosis present

## 2019-10-03 NOTE — Patient Instructions (Signed)
Access Code: [email or web address] URL: [email or web address] Date: 10/03/2019 Prepared by: Tomma Rakers  Exercises Standing Single Shoulder Flexion Wall Slide with Palm Up - 1 x daily - 7 x weekly - 1 sets - 10 reps - 5 seconds in the shower when tissue is warm hold Standing Shoulder Abduction Slides at Wall - 1 x daily - 7 x weekly - 1 sets - 10 reps - 5 seconds in the shower when tissue is warm hold

## 2019-10-03 NOTE — Therapy (Signed)
[place], [place], [address] Phone: [phone]   Fax:  [phone]  Physical Therapy Evaluation  Patient Details  Name: Kayla Randolph MRN: [id number] Date of Birth: 03-Jan-1946 Referring Provider (PT): Kayla Del MD   Encounter Date: 10/03/2019  PT End of Session - 10/03/19 1637    Visit Number  1    PT Start Time  7169    PT Stop Time  1656    PT Time Calculation (min)  42 min    Activity Tolerance  Patient tolerated treatment well    Behavior During Therapy  [facility] for tasks assessed/performed       Past Medical History:  Diagnosis Date  . Allergy   . Anxiety   . Breast cancer ([place])   . Cataract   . Depression   . History of colon polyps   . Hyperlipidemia   . Hypothyroidism   . Interstitial cystitis    History of  . Osteopenia   . S/P lumpectomy, left breast 04/03/2019  . Skin cancer    removed from left nostril  . Vitamin D deficiency     Past Surgical History:  Procedure Laterality Date  . BREAST LUMPECTOMY WITH RADIOACTIVE SEED AND SENTINEL LYMPH NODE BIOPSY Left 04/03/2019   Procedure: LEFT BREAST LUMPECTOMY WITH RADIOACTIVE SEED AND SENTINEL LYMPH NODE MAPPING;  Surgeon: Erroll Luna, MD;  Location: [facility];  Service: General;  Laterality: Left;  . CESAREAN SECTION    . EYE SURGERY Right    cataract  . TONSILLECTOMY      There were no vitals filed for this visit.   Subjective Assessment - 10/03/19 1620    Subjective  Pt states that she has noticed impairment in her ROM for the past several weeks but she was unsure of who she should contact. She contacted her oncologist and was referred to physical therapy due to cording. She previously came to physical therapy related to cording but had stopped doing her exercises due to she was feeling much better.    Pertinent History  Patient was diagnosed on 03/15/2019 with left invasive ductal carcinoma breast  cancer. Patient underwent a left lumpectomy and sentinel node biopsy (2 negative nodes) on 04/03/2019. It is ER/PR positive and HER2 negative with a Ki67 of 30%.    Patient Stated Goals  I want to stretch out my cord or learn how to stretch it out or both.    Currently in Pain?  No/denies    Multiple Pain Sites  No         [facility] PT Assessment - 10/03/19 0001      Assessment   Medical Diagnosis  L breast cancer     Referring Provider (PT)  Kayla Del MD    Onset Date/Surgical Date  04/03/19    Hand Dominance  Right    Prior Therapy  yes back in january       Balance Screen   Has the patient fallen in the past 6 months  No    Has the patient had a decrease in activity level because of a fear of falling?   No    Is the patient reluctant to leave their home because of a fear of falling?   No      Home [place]video [place] residence    Living Arrangements  Spouse/significant other;Other (Comment)   two cats     Prior Function   Level  of Independence  Independent    Vocation  Retired    Leisure  cross word puzzles, watch TV, read paper and books      Cognition   Overall Cognitive Status  Within Functional Limits for tasks assessed      Observation/Other Assessments   Observations  cording noted in the L breast through the L axilla into the brachium mid way medially with palpation, tight fascial tissue noted from the anterior and posterior proximal deltoid into the medial brachium without cording.       Posture/Postural Control   Posture/Postural Control  Postural limitations    Postural Limitations  Rounded Shoulders      ROM / Strength   AROM / PROM / Strength  AROM      AROM   AROM Assessment Site  Shoulder    Right/Left Shoulder  Right;Left    Right Shoulder Flexion  156 Degrees    Right Shoulder ABduction  140 Degrees    Right Shoulder Internal Rotation  75 Degrees    Right Shoulder External Rotation  82 Degrees    Left Shoulder Flexion  135  Degrees    Left Shoulder ABduction  133 Degrees    Left Shoulder Internal Rotation  87 Degrees    Left Shoulder External Rotation  80 Degrees        LYMPHEDEMA/ONCOLOGY QUESTIONNAIRE - 10/03/19 1631      Type   Cancer Type  L breast cancer       Surgeries   Lumpectomy Date  04/03/19    Sentinel Lymph Node Biopsy Date  04/02/01    Number Lymph Nodes Removed  2      Treatment   Active Chemotherapy Treatment  No    Past Chemotherapy Treatment  No    Active Radiation Treatment  No    Past Radiation Treatment  Yes    Date  06/11/19    Body Site  L breast and axilla    Current Hormone Treatment  Yes    Drug Name  Tamoxifen       What other symptoms do you have   Are you Having Heaviness or Tightness  Yes    Are you having Pain  Yes    Are you having pitting edema  No    Is it Hard or Difficult finding clothes that fit  No    Do you have infections  No    Is there Decreased scar mobility  Yes      Lymphedema Assessments   Lymphedema Assessments  Upper extremities      Right Upper Extremity Lymphedema   15 cm Proximal to Olecranon Process  22.9 cm    Olecranon Process  19.8 cm    15 cm Proximal to Ulnar Styloid Process  18.6 cm    Just Proximal to Ulnar Styloid Process  13.3 cm    Across Hand at [employer]  16.2 cm    At [place] of 2nd Digit  5.3 cm      Left Upper Extremity Lymphedema   15 cm Proximal to Olecranon Process  22 cm    Olecranon Process  20 cm    15 cm Proximal to Ulnar Styloid Process  18.8 cm    Just Proximal to Ulnar Styloid Process  13.3 cm    Across Hand at [employer]  16.2 cm    At [place] of 2nd Digit  5.3 cm    Other  above the nipple sitting  up straight at the end of large exhale 83 cm     Other  30 cm from the base of the L breast circumferentially around the breast     Other  25.3 cm from the base of the R breast circumferentially around the breast.             Katina Dung - 10/03/19 0001    Open a tight or new jar  Moderate  difficulty    Do heavy household chores (wash walls, wash floors)  Moderate difficulty    Carry a shopping bag or briefcase  Mild difficulty    Wash your back  Mild difficulty    Use a knife to cut food  No difficulty    Recreational activities in which you take some force or impact through your arm, shoulder, or hand (golf, hammering, tennis)  Mild difficulty    During the past week, to what extent has your arm, shoulder or hand problem interfered with your normal social activities with family, friends, neighbors, or groups?  Not at all    During the past week, to what extent has your arm, shoulder or hand problem limited your work or other regular daily activities  Not at all    Arm, shoulder, or hand pain.  Mild    Tingling (pins and needles) in your arm, shoulder, or hand  None    Difficulty Sleeping  Mild difficulty    DASH Score  20.45 %        Objective measurements completed on examination: See above findings.              PT Education - 10/03/19 1651    Education Details  Post op exercises and shower flexion abduction in order to improve ROM with warmed tissues. Pt was educated on scar tissue, fascial planes and the importance of adequate compression with lymphdema.    Person(s) Educated  Patient    Methods  Explanation;Demonstration;Verbal cues;Handout    Comprehension  Verbalized understanding;Returned demonstration       PT Short Term Goals - 10/03/19 1704      PT SHORT TERM GOAL #1   Title  Pt will be independent with HEP and with MLD of the L breast and UE.    Baseline  Pt was not performing HEP    Time  2    Period  Weeks    Status  New    Target Date  10/24/19        PT Long Term Goals - 10/03/19 1704      PT LONG TERM GOAL #1   Title  Pt will demonstrate 150 degrees of L shoulder abduction and flexion in order to demonstrate improve functional ROM.    Baseline  L shoulder flexion 135, abduction 133    Time  5    Period  Weeks    Status  New     Target Date  11/14/19      PT LONG TERM GOAL #2   Title  Pt will demonstrate appropriate fit of compression bra/garments to wear on a daily basis for home management of edema.    Baseline  pt currently is not wearing a compression bra.    Time  5    Period  Weeks    Status  New    Target Date  11/14/19      PT LONG TERM GOAL #3   Title  Pt will demonstrate 3 cm decrease in circumferential measurement of  the L breast in order to demonstrate decrease fluid build up in the L breast to decrease risk for infection and LUE lymphedema.    Baseline  See measurements.    Time  5    Period  Weeks    Status  New    Target Date  11/14/19             Plan - 10/03/19 1643    Clinical Impression Statement  Pt presents to physical therapy with increased circumferential measurements of the L breast compared to the R with obvious pitting edema in the L inferior and lateral breast with hyperpigmentation. She has no significant increase in edema noted in the LUE with circumferential measurements. Pt demonstrates decreased ROM in her L shoulder compared to her R shoulder and has significant anterior rotation of bil shoulders. Cording palpated in the L breast into the mid medial brachium of the LUE and vibile tightness noted in the myofascial from the anterior/posterior deltoids toward the mid medial brachium with no cording noted in this area. Pt will benefit from skilled physical therapy services in order to address the above limitations in order to decrease risk for immobility and infection related to edema.    Personal Factors and Comorbidities  Comorbidity 2    Comorbidities  Lumpectomy with 2 lymph nodes remove and radiation that she finished 06/11/2019    Stability/Clinical Decision Making  Stable/Uncomplicated    Clinical Decision Making  Low    Rehab Potential  Good    PT Frequency  2x / week    PT Duration  Other (comment)   5 weeks   PT Treatment/Interventions  Moist Heat;Therapeutic  exercise;Therapeutic activities;Neuromuscular re-education;Manual techniques    PT Next Visit Plan  work on MLD in the L breast and teach patient MLD. Assess exercises and compression bra see if it is appropriate.    PT Home Exercise Plan  Post op exercises and wall slide into flexion/abduction in the shower.    Recommended Other Services  possibly a pump and if she doesn't have a correct compression bra get her the right kind.       Patient will benefit from skilled therapeutic intervention in order to improve the following deficits and impairments:  Increased edema, Increased fascial restricitons, Decreased range of motion, Pain, Postural dysfunction  Visit Diagnosis: Malignant neoplasm of lower-outer quadrant of left breast of female, estrogen receptor positive (HCC)  Abnormal posture  Lymphedema, not elsewhere classified  Stiffness of left shoulder, not elsewhere classified     Problem List Patient Active Problem List   Diagnosis Date Noted  . Unspecified inflammatory spondylopathy, cervical region ([place]) 06/21/2019  . Malignant neoplasm of upper-inner quadrant of left breast in female, estrogen receptor positive ([place]) 03/16/2019  . Primary osteoarthritis involving multiple joints 10/04/2017  . Hypothyroidism due to acquired atrophy of thyroid 05/31/2017  . Depression 04/03/2013  . Hyperlipemia 12/14/2012  . Vitamin D deficiency 12/14/2012  . Allergic rhinitis 12/14/2012  . 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 Osteopenia     Ander Purpura, PT 10/03/2019, 5:08 PM  [place], [place], [address] Phone: [phone]   Fax:  [phone]  Name: Kayla Randolph MRN: [id number] Date of Birth: Dec 16, 1945

## 2019-10-03 NOTE — Telephone Encounter (Signed)
Scheduled appts per 5/18 los. Pt confirmed appt date and time.  

## 2019-10-08 ENCOUNTER — Encounter (INDEPENDENT_AMBULATORY_CARE_PROVIDER_SITE_OTHER): Payer: Self-pay

## 2019-10-08 ENCOUNTER — Ambulatory Visit: Payer: Medicare Other

## 2019-10-08 ENCOUNTER — Other Ambulatory Visit: Payer: Self-pay

## 2019-10-08 DIAGNOSIS — C50512 Malignant neoplasm of lower-outer quadrant of left female breast: Secondary | ICD-10-CM

## 2019-10-08 DIAGNOSIS — I890 Lymphedema, not elsewhere classified: Secondary | ICD-10-CM

## 2019-10-08 DIAGNOSIS — Z483 Aftercare following surgery for neoplasm: Secondary | ICD-10-CM

## 2019-10-08 DIAGNOSIS — M25612 Stiffness of left shoulder, not elsewhere classified: Secondary | ICD-10-CM

## 2019-10-08 DIAGNOSIS — R293 Abnormal posture: Secondary | ICD-10-CM

## 2019-10-08 NOTE — Therapy (Signed)
[place], [place], [address] Phone: [phone]   Fax:  [phone]  Physical Therapy Treatment  Patient Details  Name: Kayla Randolph MRN: [id number] Date of Birth: [birth year] Referring Provider (PT): Lurline Del MD   Encounter Date: 10/08/2019  PT End of Session - 10/08/19 1007    Visit Number  2    Number of Visits  11    PT Start Time  1005    PT Stop Time  1100    PT Time Calculation (min)  55 min    Activity Tolerance  Patient tolerated treatment well    Behavior During Therapy  [facility] for tasks assessed/performed       Past Medical History:  Diagnosis Date  . Allergy   . Anxiety   . Breast cancer ([place])   . Cataract   . Depression   . History of colon polyps   . Hyperlipidemia   . Hypothyroidism   . Interstitial cystitis    History of  . Osteopenia   . S/P lumpectomy, left breast 04/03/2019  . Skin cancer    removed from left nostril  . Vitamin D deficiency     Past Surgical History:  Procedure Laterality Date  . BREAST LUMPECTOMY WITH RADIOACTIVE SEED AND SENTINEL LYMPH NODE BIOPSY Left 04/03/2019   Procedure: LEFT BREAST LUMPECTOMY WITH RADIOACTIVE SEED AND SENTINEL LYMPH NODE MAPPING;  Surgeon: Erroll Luna, MD;  Location: [facility];  Service: General;  Laterality: Left;  . CESAREAN SECTION    . EYE SURGERY Right    cataract  . TONSILLECTOMY      There were no vitals filed for this visit.  Subjective Assessment - 10/08/19 1008    Subjective  Pt states that she has been doing her exercises at home. She reports that she has not tried stretching in the shower.    Pertinent History  Patient was diagnosed on 03/15/2019 with left invasive ductal carcinoma breast cancer. Patient underwent a left lumpectomy and sentinel node biopsy (2 negative nodes) on 04/03/2019. It is ER/PR positive and HER2 negative with a Ki67 of 30%.    Patient Stated Goals  I want to  stretch out my cord or learn how to stretch it out or both.    Currently in Pain?  No/denies                        [facility] Adult PT Treatment/Exercise - 10/08/19 0001      Manual Therapy   Manual Therapy  Soft tissue mobilization;Manual Lymphatic Drainage (MLD);Passive ROM;Edema management    Edema Management  Assessed pt compression bra and it is too large she requires an XS which is availabe in the [place] pt was provided with 1/4 inch gray form for the inferior breast.     Soft tissue mobilization  Soft tissue mobilization over cording from L lateral breast, axilla to mid-medial L brachium along cording with pt A/ROM and then P/ROM 10x 2 sets each into flexoin/abduction, horizontal abduction and D2 flexion pattern then stabilization at end of cord in brachium then at the breast with pt movement into all previously mentioned movements to apply stress on the cords.     Manual Lymphatic Drainage (MLD)  In supine: short neck, swimming in the terminus, bil shoulder collectors, bil axillary and L inguinal nodes, anterior inter-axillary and L axillo-inguinal anastomosis; superior and inferior breast toward corresponding anasotmosis with extra time spent at the  inferior breast and it's anastomosis; re-worked all surfaces; deep abdominals.     Passive ROM  In supine into fleixon, abduction, horizontal abduction, D2 flexion pattern to end range stretch at end of session             PT Education - 10/08/19 1112    Education Details  Pt will continue working on the same exercises at home into her new ROM until her next session. Pt educated that she may be sore for a day or two after STM. She reported no significant pain during treatment session.    Person(s) Educated  Patient    Methods  Explanation    Comprehension  Verbalized understanding       PT Short Term Goals - 10/03/19 1704      PT SHORT TERM GOAL #1   Title  Pt will be independent with HEP and with MLD of the L breast  and UE.    Baseline  Pt was not performing HEP    Time  2    Period  Weeks    Status  New    Target Date  10/24/19        PT Long Term Goals - 10/03/19 1704      PT LONG TERM GOAL #1   Title  Pt will demonstrate 150 degrees of L shoulder abduction and flexion in order to demonstrate improve functional ROM.    Baseline  L shoulder flexion 135, abduction 133    Time  5    Period  Weeks    Status  New    Target Date  11/14/19      PT LONG TERM GOAL #2   Title  Pt will demonstrate appropriate fit of compression bra/garments to wear on a daily basis for home management of edema.    Baseline  pt currently is not wearing a compression bra.    Time  5    Period  Weeks    Status  New    Target Date  11/14/19      PT LONG TERM GOAL #3   Title  Pt will demonstrate 3 cm decrease in circumferential measurement of the L breast in order to demonstrate decrease fluid build up in the L breast to decrease risk for infection and LUE lymphedema.    Baseline  See measurements.    Time  5    Period  Weeks    Status  New    Target Date  11/14/19            Plan - 10/08/19 1006    Clinical Impression Statement  Pt continues with cording in her L axillary area down to her mid medial brachium and into her L lateral breast; improvement following light petrissage along cords with movement into flexion/abduction, horizontal abduction and D2 flexion pattern as well as fascial stabilization into extremes of cord while pt performed movements. MLD was performed with extra time spent at the L inferior breast with significant decrease in edema as evidenced by softening of the tissue and decreased crevices from compression bra. Discussed possibly getting a smaller compressoin bra to help decreasing crevices from extra fabric and pt was provided with 1/4 inch gray foam for the inferior breast from medial to axilla to help soften tissue and move fluid. Pt will benefit from continued POC at this time.     Personal Factors and Comorbidities  Comorbidity 2    Comorbidities  Lumpectomy with 2 lymph nodes remove and  radiation that she finished 06/11/2019    Rehab Potential  Good    PT Frequency  2x / week    PT Duration  Other (comment)    PT Treatment/Interventions  Moist Heat;Therapeutic exercise;Therapeutic activities;Neuromuscular re-education;Manual techniques    PT Next Visit Plan  continue working on MLD in the L breast and teach patient MLD. Continue with STM over cording with movement.    PT Home Exercise Plan  Post op exercises and wall slide into flexion/abduction in the shower.    Recommended Other Services  Smaller compression bra? [facility] has a XS and possibly pump?    Consulted and Agree with Plan of Care  Patient       Patient will benefit from skilled therapeutic intervention in order to improve the following deficits and impairments:  Increased edema, Increased fascial restricitons, Decreased range of motion, Pain, Postural dysfunction  Visit Diagnosis: Malignant neoplasm of lower-outer quadrant of left breast of female, estrogen receptor positive (HCC)  Abnormal posture  Lymphedema, not elsewhere classified  Stiffness of left shoulder, not elsewhere classified  Aftercare following surgery for neoplasm     Problem List Patient Active Problem List   Diagnosis Date Noted  . Unspecified inflammatory spondylopathy, cervical region ([place]) 06/21/2019  . Malignant neoplasm of upper-inner quadrant of left breast in female, estrogen receptor positive ([place]) 03/16/2019  . Primary osteoarthritis involving multiple joints 10/04/2017  . Hypothyroidism due to acquired atrophy of thyroid 05/31/2017  . Depression 04/03/2013  . Hyperlipemia 12/14/2012  . Vitamin D deficiency 12/14/2012  . Allergic rhinitis 12/14/2012  . 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 Osteopenia     Ander Purpura, PT 10/08/2019, 11:21 AM  [place], [place], [address] Phone: [phone]   Fax:  [phone]  Name: Kayla Randolph MRN: [id number] Date of Birth: 1945/12/05

## 2019-10-10 ENCOUNTER — Telehealth: Payer: Self-pay | Admitting: Family Medicine

## 2019-10-11 ENCOUNTER — Encounter: Payer: Self-pay | Admitting: Physical Therapy

## 2019-10-11 ENCOUNTER — Ambulatory Visit: Payer: Medicare Other | Admitting: Physical Therapy

## 2019-10-11 ENCOUNTER — Other Ambulatory Visit: Payer: Self-pay

## 2019-10-11 DIAGNOSIS — R293 Abnormal posture: Secondary | ICD-10-CM

## 2019-10-11 DIAGNOSIS — M25612 Stiffness of left shoulder, not elsewhere classified: Secondary | ICD-10-CM

## 2019-10-11 DIAGNOSIS — C50512 Malignant neoplasm of lower-outer quadrant of left female breast: Secondary | ICD-10-CM | POA: Diagnosis not present

## 2019-10-11 DIAGNOSIS — Z483 Aftercare following surgery for neoplasm: Secondary | ICD-10-CM

## 2019-10-11 NOTE — Therapy (Signed)
[place], [place], [address] Phone: [phone]   Fax:  [phone]  Physical Therapy Treatment  Patient Details  Name: Kayla Randolph MRN: [id number] Date of Birth: 17-Aug-1945 Referring Provider (PT): Lurline Del MD   Encounter Date: 10/11/2019  PT End of Session - 10/11/19 1504    Visit Number  3    Number of Visits  11    PT Start Time  1115    PT Stop Time  1154    PT Time Calculation (min)  39 min    Activity Tolerance  Patient tolerated treatment well    Behavior During Therapy  [facility] for tasks assessed/performed       Past Medical History:  Diagnosis Date  . Allergy   . Anxiety   . Breast cancer ([place])   . Cataract   . Depression   . History of colon polyps   . Hyperlipidemia   . Hypothyroidism   . Interstitial cystitis    History of  . Osteopenia   . S/P lumpectomy, left breast 04/03/2019  . Skin cancer    removed from left nostril  . Vitamin D deficiency     Past Surgical History:  Procedure Laterality Date  . BREAST LUMPECTOMY WITH RADIOACTIVE SEED AND SENTINEL LYMPH NODE BIOPSY Left 04/03/2019   Procedure: LEFT BREAST LUMPECTOMY WITH RADIOACTIVE SEED AND SENTINEL LYMPH NODE MAPPING;  Surgeon: Erroll Luna, MD;  Location: [place];  Service: General;  Laterality: Left;  . CESAREAN SECTION    . EYE SURGERY Right    cataract  . TONSILLECTOMY      There were no vitals filed for this visit.  Subjective Assessment - 10/11/19 1117    Subjective  I think the swelling is a little better. I have been wearing the foam since I got it.    Pertinent History  Patient was diagnosed on 03/15/2019 with left invasive ductal carcinoma breast cancer. Patient underwent a left lumpectomy and sentinel node biopsy (2 negative nodes) on 04/03/2019. It is ER/PR positive and HER2 negative with a Ki67 of 30%.    Patient Stated Goals  I want to stretch out my cord or learn how to  stretch it out or both.    Currently in Pain?  No/denies                        [facility] Adult PT Treatment/Exercise - 10/11/19 0001      Manual Therapy   Soft tissue mobilization  scar massage to area left lumpectomy scar to help improve lymphatic flow and decrease fibrosis    Manual Lymphatic Drainage (MLD)  In supine: short neck, right axillary nodes and establishment of interaxillary pathway, left inguinal nodes and establishment of axillo inguinal pathway, left breast moving fluid towards pathways with focus on inferior breast where fibrosis is present then retracing all steps    Passive ROM  in supine in to flexion, abduction and ER                PT Short Term Goals - 10/03/19 1704      PT SHORT TERM GOAL #1   Title  Pt will be independent with HEP and with MLD of the L breast and UE.    Baseline  Pt was not performing HEP    Time  2    Period  Weeks    Status  New    Target Date  10/24/19        PT Long Term Goals - 10/03/19 1704      PT LONG TERM GOAL #1   Title  Pt will demonstrate 150 degrees of L shoulder abduction and flexion in order to demonstrate improve functional ROM.    Baseline  L shoulder flexion 135, abduction 133    Time  5    Period  Weeks    Status  New    Target Date  11/14/19      PT LONG TERM GOAL #2   Title  Pt will demonstrate appropriate fit of compression bra/garments to wear on a daily basis for home management of edema.    Baseline  pt currently is not wearing a compression bra.    Time  5    Period  Weeks    Status  New    Target Date  11/14/19      PT LONG TERM GOAL #3   Title  Pt will demonstrate 3 cm decrease in circumferential measurement of the L breast in order to demonstrate decrease fluid build up in the L breast to decrease risk for infection and LUE lymphedema.    Baseline  See measurements.    Time  5    Period  Weeks    Status  New    Target Date  11/14/19            Plan - 10/11/19 1459     Clinical Impression Statement  Pt presents to PT today with improved left shoulder ROM. She is still having increased swelling and fibrosis in left interior breast surrounding lumpectomy scar. Performed scar mobilization to this area and MLD and fibrosis softening significantly be end of session. Educated pt to try and place foam over this area and place her bra on top of the foam to see if that would allow improved lymph flow. She is going to try this. No cording was palpable today.    PT Frequency  2x / week    PT Duration  Other (comment)   5 weeks   PT Treatment/Interventions  Moist Heat;Therapeutic exercise;Therapeutic activities;Neuromuscular re-education;Manual techniques    PT Next Visit Plan  continue working on MLD in the L breast and teach patient MLD. Continue with STM over cording if present with movement.    PT Home Exercise Plan  Post op exercises and wall slide into flexion/abduction in the shower.    Consulted and Agree with Plan of Care  Patient       Patient will benefit from skilled therapeutic intervention in order to improve the following deficits and impairments:  Increased edema, Increased fascial restricitons, Decreased range of motion, Pain, Postural dysfunction  Visit Diagnosis: Aftercare following surgery for neoplasm  Stiffness of left shoulder, not elsewhere classified  Abnormal posture     Problem List Patient Active Problem List   Diagnosis Date Noted  . Unspecified inflammatory spondylopathy, cervical region ([place]) 06/21/2019  . Malignant neoplasm of upper-inner quadrant of left breast in female, estrogen receptor positive ([place]) 03/16/2019  . Primary osteoarthritis involving multiple joints 10/04/2017  . Hypothyroidism due to acquired atrophy of thyroid 05/31/2017  . Depression 04/03/2013  . Hyperlipemia 12/14/2012  . Vitamin D deficiency 12/14/2012  . Allergic rhinitis 12/14/2012  . 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 Upper extremity  neuropathy 12/14/2012  . Osteopenia     Allyson Sabal [facility] 10/11/2019, 3:05 PM  [place], [place], [address]  Phone: [phone]   Fax:  [phone]  Name: ESHA FINCHER MRN: [id number] Date of Birth: January 08, 1946  Manus Gunning, PT 10/11/19 3:05 PM

## 2019-10-15 ENCOUNTER — Encounter (INDEPENDENT_AMBULATORY_CARE_PROVIDER_SITE_OTHER): Payer: Self-pay

## 2019-10-18 ENCOUNTER — Other Ambulatory Visit: Payer: Self-pay

## 2019-10-18 ENCOUNTER — Ambulatory Visit: Payer: Medicare Other | Attending: Oncology | Admitting: Physical Therapy

## 2019-10-18 ENCOUNTER — Encounter: Payer: Self-pay | Admitting: Physical Therapy

## 2019-10-18 DIAGNOSIS — C50512 Malignant neoplasm of lower-outer quadrant of left female breast: Secondary | ICD-10-CM | POA: Insufficient documentation

## 2019-10-18 DIAGNOSIS — Z483 Aftercare following surgery for neoplasm: Secondary | ICD-10-CM | POA: Insufficient documentation

## 2019-10-18 DIAGNOSIS — M25612 Stiffness of left shoulder, not elsewhere classified: Secondary | ICD-10-CM | POA: Insufficient documentation

## 2019-10-18 DIAGNOSIS — I890 Lymphedema, not elsewhere classified: Secondary | ICD-10-CM | POA: Diagnosis not present

## 2019-10-18 DIAGNOSIS — Z170 Estrogen receptor positive status [ER+]: Secondary | ICD-10-CM | POA: Diagnosis present

## 2019-10-18 DIAGNOSIS — R293 Abnormal posture: Secondary | ICD-10-CM | POA: Diagnosis present

## 2019-10-18 NOTE — Therapy (Signed)
[place], [place], [address] Phone: [phone]   Fax:  [phone]  Physical Therapy Treatment  Patient Details  Name: Kayla Randolph MRN: [id number] Date of Birth: March 24, 1946 Referring Provider (PT): Lurline Del MD   Encounter Date: 10/18/2019  PT End of Session - 10/18/19 1352    Visit Number  4    Number of Visits  11    Date for PT Re-Evaluation  11/07/19    PT Start Time  7893   pt arrived late   PT Stop Time  1353    PT Time Calculation (min)  46 min    Activity Tolerance  Patient tolerated treatment well    Behavior During Therapy  [facility] for tasks assessed/performed       Past Medical History:  Diagnosis Date  . Allergy   . Anxiety   . Breast cancer ([place])   . Cataract   . Depression   . History of colon polyps   . Hyperlipidemia   . Hypothyroidism   . Interstitial cystitis    History of  . Osteopenia   . S/P lumpectomy, left breast 04/03/2019  . Skin cancer    removed from left nostril  . Vitamin D deficiency     Past Surgical History:  Procedure Laterality Date  . BREAST LUMPECTOMY WITH RADIOACTIVE SEED AND SENTINEL LYMPH NODE BIOPSY Left 04/03/2019   Procedure: LEFT BREAST LUMPECTOMY WITH RADIOACTIVE SEED AND SENTINEL LYMPH NODE MAPPING;  Surgeon: Erroll Luna, MD;  Location: [place];  Service: General;  Laterality: Left;  . CESAREAN SECTION    . EYE SURGERY Right    cataract  . TONSILLECTOMY      There were no vitals filed for this visit.  Subjective Assessment - 10/18/19 1308    Subjective  I think the swelling has lessened because this side looks more like the other side. I have wearing the thing you made me all the time and I think it is helping.    Pertinent History  Patient was diagnosed on 03/15/2019 with left invasive ductal carcinoma breast cancer. Patient underwent a left lumpectomy and sentinel node biopsy (2 negative nodes) on  04/03/2019. It is ER/PR positive and HER2 negative with a Ki67 of 30%.    Patient Stated Goals  I want to stretch out my cord or learn how to stretch it out or both.    Currently in Pain?  No/denies                        [facility] Adult PT Treatment/Exercise - 10/18/19 0001      Manual Therapy   Soft tissue mobilization  scar massage to area left lumpectomy scar to help improve lymphatic flow and decrease fibrosis    Manual Lymphatic Drainage (MLD)  In supine: short neck, right axillary nodes and establishment of interaxillary pathway, left inguinal nodes and establishment of axillo inguinal pathway, left breast moving fluid towards pathways with focus on inferior breast where fibrosis is present then retracing all steps    Passive ROM  in supine in to flexion, abduction and ER with prolonged holds               PT Short Term Goals - 10/03/19 1704      PT SHORT TERM GOAL #1   Title  Pt will be independent with HEP and with MLD of the L breast and UE.    Baseline  Pt was not performing HEP    Time  2    Period  Weeks    Status  New    Target Date  10/24/19        PT Long Term Goals - 10/03/19 1704      PT LONG TERM GOAL #1   Title  Pt will demonstrate 150 degrees of L shoulder abduction and flexion in order to demonstrate improve functional ROM.    Baseline  L shoulder flexion 135, abduction 133    Time  5    Period  Weeks    Status  New    Target Date  11/14/19      PT LONG TERM GOAL #2   Title  Pt will demonstrate appropriate fit of compression bra/garments to wear on a daily basis for home management of edema.    Baseline  pt currently is not wearing a compression bra.    Time  5    Period  Weeks    Status  New    Target Date  11/14/19      PT LONG TERM GOAL #3   Title  Pt will demonstrate 3 cm decrease in circumferential measurement of the L breast in order to demonstrate decrease fluid build up in the L breast to decrease risk for infection and  LUE lymphedema.    Baseline  See measurements.    Time  5    Period  Weeks    Status  New    Target Date  11/14/19            Plan - 10/18/19 1354    Clinical Impression Statement  Pt reports she has not been as compliant with her stretches recently because she has been busy. She is still having tightness near end range flexion and abduction and feels tightness in her axilla with these motions. Encouraged pt to continue to do her exercises at home to help maintain gains in therapy with ROM. Also continued with MLD to L breast. L breast fibrosis has improved and pt reports she has been wearing foam in her bra on a consistent basis.    PT Frequency  2x / week    PT Duration  Other (comment)   5 weeks   PT Treatment/Interventions  Moist Heat;Therapeutic exercise;Therapeutic activities;Neuromuscular re-education;Manual techniques    PT Next Visit Plan  continue working on MLD in the L breast and teach patient MLD. Continue with STM over cording if present with movement.    PT Home Exercise Plan  Post op exercises and wall slide into flexion/abduction in the shower.    Consulted and Agree with Plan of Care  Patient       Patient will benefit from skilled therapeutic intervention in order to improve the following deficits and impairments:  Increased edema, Increased fascial restricitons, Decreased range of motion, Pain, Postural dysfunction  Visit Diagnosis: Lymphedema, not elsewhere classified  Stiffness of left shoulder, not elsewhere classified  Aftercare following surgery for neoplasm     Problem List Patient Active Problem List   Diagnosis Date Noted  . Unspecified inflammatory spondylopathy, cervical region ([place]) 06/21/2019  . Malignant neoplasm of upper-inner quadrant of left breast in female, estrogen receptor positive ([place]) 03/16/2019  . Primary osteoarthritis involving multiple joints 10/04/2017  . Hypothyroidism due to acquired atrophy of thyroid 05/31/2017  .  Depression 04/03/2013  . Hyperlipemia 12/14/2012  . Vitamin D deficiency 12/14/2012  . Allergic rhinitis 12/14/2012  . 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 Osteopenia     Allyson Sabal [facility] 10/18/2019, 1:57 PM  [place], [place], [address] Phone: [phone]   Fax:  [phone]  Name: Kayla Randolph MRN: [id number] Date of Birth: November 05, 1945  Manus Gunning, PT 10/18/19 1:57 PM

## 2019-10-22 ENCOUNTER — Encounter (INDEPENDENT_AMBULATORY_CARE_PROVIDER_SITE_OTHER): Payer: Self-pay

## 2019-10-23 ENCOUNTER — Other Ambulatory Visit: Payer: Self-pay

## 2019-10-23 ENCOUNTER — Encounter: Payer: Self-pay | Admitting: Physical Therapy

## 2019-10-23 ENCOUNTER — Ambulatory Visit: Payer: Medicare Other | Admitting: Physical Therapy

## 2019-10-23 DIAGNOSIS — R293 Abnormal posture: Secondary | ICD-10-CM

## 2019-10-23 DIAGNOSIS — M25612 Stiffness of left shoulder, not elsewhere classified: Secondary | ICD-10-CM

## 2019-10-23 DIAGNOSIS — I890 Lymphedema, not elsewhere classified: Secondary | ICD-10-CM | POA: Diagnosis not present

## 2019-10-23 DIAGNOSIS — Z483 Aftercare following surgery for neoplasm: Secondary | ICD-10-CM

## 2019-10-23 NOTE — Therapy (Signed)
[place], [place], [address] Phone: [phone]   Fax:  [phone]  Physical Therapy Treatment  Patient Details  Name: Kayla Randolph MRN: [id number] Date of Birth: 1945/09/03 Referring Provider (PT): Lurline Del MD   Encounter Date: 10/23/2019  PT End of Session - 10/23/19 1352    Visit Number  5    Number of Visits  11    Date for PT Re-Evaluation  11/07/19    PT Start Time  1607   pt had to go to other side to check in   PT Stop Time  1352    PT Time Calculation (min)  44 min    Activity Tolerance  Patient tolerated treatment well    Behavior During Therapy  [facility] for tasks assessed/performed       Past Medical History:  Diagnosis Date  . Allergy   . Anxiety   . Breast cancer ([place])   . Cataract   . Depression   . History of colon polyps   . Hyperlipidemia   . Hypothyroidism   . Interstitial cystitis    History of  . Osteopenia   . S/P lumpectomy, left breast 04/03/2019  . Skin cancer    removed from left nostril  . Vitamin D deficiency     Past Surgical History:  Procedure Laterality Date  . BREAST LUMPECTOMY WITH RADIOACTIVE SEED AND SENTINEL LYMPH NODE BIOPSY Left 04/03/2019   Procedure: LEFT BREAST LUMPECTOMY WITH RADIOACTIVE SEED AND SENTINEL LYMPH NODE MAPPING;  Surgeon: Erroll Luna, MD;  Location: [facility];  Service: General;  Laterality: Left;  . CESAREAN SECTION    . EYE SURGERY Right    cataract  . TONSILLECTOMY      There were no vitals filed for this visit.  Subjective Assessment - 10/23/19 1309    Subjective  I think the swelling is better. I still feel some pulling in my side. It is not there all the time. I don't remember how to do the band exercises.    Pertinent History  Patient was diagnosed on 03/15/2019 with left invasive ductal carcinoma breast cancer. Patient underwent a left lumpectomy and sentinel node biopsy (2 negative nodes) on  04/03/2019. It is ER/PR positive and HER2 negative with a Ki67 of 30%.    Patient Stated Goals  I want to stretch out my cord or learn how to stretch it out or both.    Currently in Pain?  No/denies                        [facility] Adult PT Treatment/Exercise - 10/23/19 0001      Shoulder Exercises: Supine   Horizontal ABduction  Strengthening;Both;10 reps;Theraband   pt returned therapist demo   Theraband Level (Shoulder Horizontal ABduction)  Level 1 (Yellow)    External Rotation  Strengthening;Both;10 reps;Theraband   pt returned therapist demo   Theraband Level (Shoulder External Rotation)  Level 1 (Yellow)    Flexion  Strengthening;Both;10 reps;Theraband   narrow and wide grip, pt returned therapist demo   Theraband Level (Shoulder Flexion)  Level 1 (Yellow)    Diagonals  Strengthening;Both;10 reps;Theraband   pt returned therapist demo   Theraband Level (Shoulder Diagonals)  Level 1 (Yellow)      Manual Therapy   Manual Lymphatic Drainage (MLD)  In supine: short neck, right axillary nodes and establishment of interaxillary pathway, left inguinal nodes and establishment of axillo inguinal pathway,  left breast moving fluid towards pathways with focus on inferior breast where fibrosis is present then retracing all steps    Passive ROM  in supine in to flexion, abduction and ER with prolonged holds               PT Short Term Goals - 10/03/19 1704      PT SHORT TERM GOAL #1   Title  Pt will be independent with HEP and with MLD of the L breast and UE.    Baseline  Pt was not performing HEP    Time  2    Period  Weeks    Status  New    Target Date  10/24/19        PT Long Term Goals - 10/03/19 1704      PT LONG TERM GOAL #1   Title  Pt will demonstrate 150 degrees of L shoulder abduction and flexion in order to demonstrate improve functional ROM.    Baseline  L shoulder flexion 135, abduction 133    Time  5    Period  Weeks    Status  New    Target  Date  11/14/19      PT LONG TERM GOAL #2   Title  Pt will demonstrate appropriate fit of compression bra/garments to wear on a daily basis for home management of edema.    Baseline  pt currently is not wearing a compression bra.    Time  5    Period  Weeks    Status  New    Target Date  11/14/19      PT LONG TERM GOAL #3   Title  Pt will demonstrate 3 cm decrease in circumferential measurement of the L breast in order to demonstrate decrease fluid build up in the L breast to decrease risk for infection and LUE lymphedema.    Baseline  See measurements.    Time  5    Period  Weeks    Status  New    Target Date  11/14/19            Plan - 10/23/19 1356    Clinical Impression Statement  Educated pt in supine scapular series for strengthening and end range stretching. Educated pt to hold reps for 5 seconds and to do daily. Had pt return demonstrate all. Continud with L breast MLD. She is still having fibrosis at L medial/inferior breast. Worked on end range stretching to L shoulder in direction of flexion and abduction.    Rehab Potential  Good    PT Frequency  2x / week    PT Duration  --   5 weeks   PT Treatment/Interventions  Moist Heat;Therapeutic exercise;Therapeutic activities;Neuromuscular re-education;Manual techniques    PT Next Visit Plan  assess indep with supine scap, continue working on MLD in the L breast and teach patient MLD. Continue with STM over cording if present with movement.    PT Home Exercise Plan  Post op exercises and wall slide into flexion/abduction in the shower, supine scap    Consulted and Agree with Plan of Care  Patient       Patient will benefit from skilled therapeutic intervention in order to improve the following deficits and impairments:  Increased edema, Increased fascial restricitons, Decreased range of motion, Pain, Postural dysfunction  Visit Diagnosis: Stiffness of left shoulder, not elsewhere classified  Abnormal posture  Aftercare  following surgery for neoplasm  Lymphedema, not elsewhere classified  Problem List Patient Active Problem List   Diagnosis Date Noted  . Unspecified inflammatory spondylopathy, cervical region ([place]) 06/21/2019  . Malignant neoplasm of upper-inner quadrant of left breast in female, estrogen receptor positive ([place]) 03/16/2019  . Primary osteoarthritis involving multiple joints 10/04/2017  . Hypothyroidism due to acquired atrophy of thyroid 05/31/2017  . Depression 04/03/2013  . Hyperlipemia 12/14/2012  . Vitamin D deficiency 12/14/2012  . Allergic rhinitis 12/14/2012  . 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 Osteopenia     Allyson Sabal [facility] 10/23/2019, 1:59 PM  [place], [place], [address] Phone: [phone]   Fax:  [phone]  Name: LORALIE MALTA MRN: [id number] Date of Birth: 16-Jul-1945  Manus Gunning, PT 10/23/19 1:59 PM

## 2019-10-23 NOTE — Patient Instructions (Signed)

## 2019-10-26 ENCOUNTER — Ambulatory Visit: Payer: Medicare Other

## 2019-10-26 ENCOUNTER — Other Ambulatory Visit: Payer: Self-pay

## 2019-10-26 DIAGNOSIS — I890 Lymphedema, not elsewhere classified: Secondary | ICD-10-CM

## 2019-10-26 DIAGNOSIS — Z483 Aftercare following surgery for neoplasm: Secondary | ICD-10-CM

## 2019-10-26 DIAGNOSIS — C50512 Malignant neoplasm of lower-outer quadrant of left female breast: Secondary | ICD-10-CM

## 2019-10-26 DIAGNOSIS — R293 Abnormal posture: Secondary | ICD-10-CM

## 2019-10-26 DIAGNOSIS — M25612 Stiffness of left shoulder, not elsewhere classified: Secondary | ICD-10-CM

## 2019-10-26 NOTE — Therapy (Signed)
[place], [place], [address] Phone: [phone]   Fax:  [phone]  Physical Therapy Treatment  Patient Details  Name: Kayla Randolph MRN: [id number] Date of Birth: [birth year] Referring Provider (PT): Lurline Del MD   Encounter Date: 10/26/2019   PT End of Session - 10/26/19 1011    Visit Number 6    Number of Visits 11    Date for PT Re-Evaluation 11/07/19    PT Start Time 1010    PT Stop Time 1055    PT Time Calculation (min) 45 min    Activity Tolerance Patient tolerated treatment well    Behavior During Therapy [facility] for tasks assessed/performed           Past Medical History:  Diagnosis Date  . Allergy   . Anxiety   . Breast cancer ([place])   . Cataract   . Depression   . History of colon polyps   . Hyperlipidemia   . Hypothyroidism   . Interstitial cystitis    History of  . Osteopenia   . S/P lumpectomy, left breast 04/03/2019  . Skin cancer    removed from left nostril  . Vitamin D deficiency     Past Surgical History:  Procedure Laterality Date  . BREAST LUMPECTOMY WITH RADIOACTIVE SEED AND SENTINEL LYMPH NODE BIOPSY Left 04/03/2019   Procedure: LEFT BREAST LUMPECTOMY WITH RADIOACTIVE SEED AND SENTINEL LYMPH NODE MAPPING;  Surgeon: Erroll Luna, MD;  Location: [place];  Service: General;  Laterality: Left;  . CESAREAN SECTION    . EYE SURGERY Right    cataract  . TONSILLECTOMY      There were no vitals filed for this visit.   Subjective Assessment - 10/26/19 1012    Subjective Pt states that she has been doing her exercises at home but it took her a while to get her exercises straight in her head. She reports that she thinks she has improved with performing them after her last session when she was able to go over her exercises.    Pertinent History Patient was diagnosed on 03/15/2019 with left invasive ductal carcinoma breast cancer. Patient underwent  a left lumpectomy and sentinel node biopsy (2 negative nodes) on 04/03/2019. It is ER/PR positive and HER2 negative with a Ki67 of 30%.    Patient Stated Goals I want to stretch out my cord or learn how to stretch it out or both.    Currently in Pain? No/denies                             [facility] Adult PT Treatment/Exercise - 10/26/19 0001      Manual Therapy   Manual Therapy Manual Lymphatic Drainage (MLD);Soft tissue mobilization;Passive ROM    Soft tissue mobilization effleurage to warm up the skin followed by STM (petrissage) with movement longitudinally along cords with multiple P/ROM into flexion and abduction throughout the length of the cords with 3 pops with P/ROM, A/ROM 10x for each movement petrissage along the length of the cord into flexion and abduction with 1x pop heard during abduction. Stabilization of fascia 10x with abduction and flexion at the cord in the L lateral breast.     Manual Lymphatic Drainage (MLD) In supine: short neck, bil axillary nodes and establishment of interaxillary pathway, left inguinal nodes and establishment of axillo inguinal pathway, left breast moving fluid towards pathways with focus on inferior breast  where fibrosis is present then retracing all steps and deep abdominals    Passive ROM Into flexion and abduction with easy end range stretch                   PT Education - 10/26/19 1054    Education Details Pt educated to wear her compression bra as much as she is able especially in the heat and to spent extra time at the inferior/lateral breast when performing MLD to help move fluid out of this area    Person(s) Educated Patient    Methods Explanation    Comprehension Verbalized understanding            PT Short Term Goals - 10/03/19 1704      PT SHORT TERM GOAL #1   Title Pt will be independent with HEP and with MLD of the L breast and UE.    Baseline Pt was not performing HEP    Time 2    Period Weeks    Status  New    Target Date 10/24/19             PT Long Term Goals - 10/03/19 1704      PT LONG TERM GOAL #1   Title Pt will demonstrate 150 degrees of L shoulder abduction and flexion in order to demonstrate improve functional ROM.    Baseline L shoulder flexion 135, abduction 133    Time 5    Period Weeks    Status New    Target Date 11/14/19      PT LONG TERM GOAL #2   Title Pt will demonstrate appropriate fit of compression bra/garments to wear on a daily basis for home management of edema.    Baseline pt currently is not wearing a compression bra.    Time 5    Period Weeks    Status New    Target Date 11/14/19      PT LONG TERM GOAL #3   Title Pt will demonstrate 3 cm decrease in circumferential measurement of the L breast in order to demonstrate decrease fluid build up in the L breast to decrease risk for infection and LUE lymphedema.    Baseline See measurements.    Time 5    Period Weeks    Status New    Target Date 11/14/19                 Plan - 10/26/19 1011    Clinical Impression Statement Pt presents to physicla therapy with continued cording in the L axilla that is visible with flexion/abduction. Easy effleurage to warm up the skin followed by petrissage over cording with passive and active movement in to flexion and abduction then performed fascial stabilization with active flexion and abduction. Pt demonstrates decreased fascial restrictions and visible cording following manual therapy with reports of feeling less stiff and equal movement into flexion/abduction in standing comparing to the R. MLD was performed following manual therapy in order to decrease thick denase area in the L lateral breast with significant reduction and discussed with pt about spending more time on this area when performing self MLD as well as wearing her compression bra as often as possible. Pt will benefit from continued POC at this time.    Personal Factors and Comorbidities Comorbidity  2    Comorbidities Lumpectomy with 2 lymph nodes remove and radiation that she finished 06/11/2019    Rehab Potential Good    PT Frequency 2x /  week    PT Treatment/Interventions Moist Heat;Therapeutic exercise;Therapeutic activities;Neuromuscular re-education;Manual techniques    PT Next Visit Plan assess indep with supine scap, continue working on MLD in the L breast and teach patient MLD. Continue with STM over cording if present with movement.    PT Home Exercise Plan Post op exercises and wall slide into flexion/abduction in the shower, supine scap    Consulted and Agree with Plan of Care Patient           Patient will benefit from skilled therapeutic intervention in order to improve the following deficits and impairments:  Increased edema, Increased fascial restricitons, Decreased range of motion, Pain, Postural dysfunction  Visit Diagnosis: Stiffness of left shoulder, not elsewhere classified  Abnormal posture  Aftercare following surgery for neoplasm  Lymphedema, not elsewhere classified  Malignant neoplasm of lower-outer quadrant of left breast of female, estrogen receptor positive ([place])     Problem List Patient Active Problem List   Diagnosis Date Noted  . Unspecified inflammatory spondylopathy, cervical region ([place]) 06/21/2019  . Malignant neoplasm of upper-inner quadrant of left breast in female, estrogen receptor positive ([place]) 03/16/2019  . Primary osteoarthritis involving multiple joints 10/04/2017  . Hypothyroidism due to acquired atrophy of thyroid 05/31/2017  . Depression 04/03/2013  . Hyperlipemia 12/14/2012  . Vitamin D deficiency 12/14/2012  . Allergic rhinitis 12/14/2012  . 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 Osteopenia     Ander Purpura, PT 10/26/2019, 11:00 AM  [place], [place], [address] Phone: [phone]   Fax:   [phone]  Name: Kayla Randolph MRN: [id number] Date of Birth: [birth year]

## 2019-10-29 ENCOUNTER — Other Ambulatory Visit: Payer: Self-pay

## 2019-10-29 ENCOUNTER — Encounter (INDEPENDENT_AMBULATORY_CARE_PROVIDER_SITE_OTHER): Payer: Self-pay

## 2019-10-29 ENCOUNTER — Ambulatory Visit: Payer: Medicare Other | Admitting: Rehabilitation

## 2019-10-29 ENCOUNTER — Encounter: Payer: Self-pay | Admitting: Rehabilitation

## 2019-10-29 DIAGNOSIS — Z483 Aftercare following surgery for neoplasm: Secondary | ICD-10-CM

## 2019-10-29 DIAGNOSIS — I890 Lymphedema, not elsewhere classified: Secondary | ICD-10-CM | POA: Diagnosis not present

## 2019-10-29 DIAGNOSIS — R293 Abnormal posture: Secondary | ICD-10-CM

## 2019-10-29 DIAGNOSIS — M25612 Stiffness of left shoulder, not elsewhere classified: Secondary | ICD-10-CM

## 2019-10-29 DIAGNOSIS — C50512 Malignant neoplasm of lower-outer quadrant of left female breast: Secondary | ICD-10-CM

## 2019-10-29 NOTE — Therapy (Signed)
[place], [place], [address] Phone: [phone]   Fax:  [phone]  Physical Therapy Treatment  Patient Details  Name: Kayla Randolph MRN: [id number] Date of Birth: [birth year] Referring Provider (PT): Lurline Del MD   Encounter Date: 10/29/2019   PT End of Session - 10/29/19 1717    Visit Number 7    Number of Visits 11    Date for PT Re-Evaluation 11/07/19    PT Start Time 1602    PT Stop Time 1657    PT Time Calculation (min) 55 min    Activity Tolerance Patient tolerated treatment well    Behavior During Therapy [facility] for tasks assessed/performed           Past Medical History:  Diagnosis Date  . Allergy   . Anxiety   . Breast cancer ([place])   . Cataract   . Depression   . History of colon polyps   . Hyperlipidemia   . Hypothyroidism   . Interstitial cystitis    History of  . Osteopenia   . S/P lumpectomy, left breast 04/03/2019  . Skin cancer    removed from left nostril  . Vitamin D deficiency     Past Surgical History:  Procedure Laterality Date  . BREAST LUMPECTOMY WITH RADIOACTIVE SEED AND SENTINEL LYMPH NODE BIOPSY Left 04/03/2019   Procedure: LEFT BREAST LUMPECTOMY WITH RADIOACTIVE SEED AND SENTINEL LYMPH NODE MAPPING;  Surgeon: Erroll Luna, MD;  Location: [place];  Service: General;  Laterality: Left;  . CESAREAN SECTION    . EYE SURGERY Right    cataract  . TONSILLECTOMY      There were no vitals filed for this visit.   Subjective Assessment - 10/29/19 1603    Subjective The swelling is better but still there.  The arm is still tight    Pertinent History Patient was diagnosed on 03/15/2019 with left invasive ductal carcinoma breast cancer. Patient underwent a left lumpectomy and sentinel node biopsy (2 negative nodes) on 04/03/2019. It is ER/PR positive and HER2 negative with a Ki67 of 30%.    Currently in Pain? No/denies                              [facility] Adult PT Treatment/Exercise - 10/29/19 0001      Manual Therapy   Soft tissue mobilization STM over axillary cording and into latissimus/serratus near lateral breast border also in sidelying into the lateral breast fibrosis for more firmer pressure prior to MLD     Manual Lymphatic Drainage (MLD) In supine: short neck, bil axillary nodes and establishment of interaxillary pathway, left inguinal nodes and establishment of axillo inguinal pathway, left breast moving fluid towards pathways with focus on inferior breast where fibrosis is present then retracing all steps and deep abdominals. Then sidelying posterior interaxillary work to lateral breast    Passive ROM into flexion, abduction, horizontal abduction, and D2 flexion                    PT Short Term Goals - 10/03/19 1704      PT SHORT TERM GOAL #1   Title Pt will be independent with HEP and with MLD of the L breast and UE.    Baseline Pt was not performing HEP    Time 2    Period Weeks    Status New    Target Date  10/24/19             PT Long Term Goals - 10/03/19 1704      PT LONG TERM GOAL #1   Title Pt will demonstrate 150 degrees of L shoulder abduction and flexion in order to demonstrate improve functional ROM.    Baseline L shoulder flexion 135, abduction 133    Time 5    Period Weeks    Status New    Target Date 11/14/19      PT LONG TERM GOAL #2   Title Pt will demonstrate appropriate fit of compression bra/garments to wear on a daily basis for home management of edema.    Baseline pt currently is not wearing a compression bra.    Time 5    Period Weeks    Status New    Target Date 11/14/19      PT LONG TERM GOAL #3   Title Pt will demonstrate 3 cm decrease in circumferential measurement of the L breast in order to demonstrate decrease fluid build up in the L breast to decrease risk for infection and LUE lymphedema.    Baseline See measurements.    Time  5    Period Weeks    Status New    Target Date 11/14/19                 Plan - 10/29/19 1717    Clinical Impression Statement Cording still evident in Lt axilla near pectoralis border with horizontal abduction/D2 position.  Continues with lateral/inferior breast fibrosis and dense tissue in this area which continues to decrease with STM and MLD.    PT Frequency 2x / week    PT Treatment/Interventions Moist Heat;Therapeutic exercise;Therapeutic activities;Neuromuscular re-education;Manual techniques    PT Next Visit Plan assess indep with supine scap, continue working on MLD in the L breast. Continue with STM over cording if present with movement.    PT Home Exercise Plan Post op exercises and wall slide into flexion/abduction in the shower, supine scap    Consulted and Agree with Plan of Care Patient           Patient will benefit from skilled therapeutic intervention in order to improve the following deficits and impairments:     Visit Diagnosis: Stiffness of left shoulder, not elsewhere classified  Abnormal posture  Aftercare following surgery for neoplasm  Lymphedema, not elsewhere classified  Malignant neoplasm of lower-outer quadrant of left breast of female, estrogen receptor positive ([place])     Problem List Patient Active Problem List   Diagnosis Date Noted  . Unspecified inflammatory spondylopathy, cervical region ([place]) 06/21/2019  . Malignant neoplasm of upper-inner quadrant of left breast in female, estrogen receptor positive ([place]) 03/16/2019  . Primary osteoarthritis involving multiple joints 10/04/2017  . Hypothyroidism due to acquired atrophy of thyroid 05/31/2017  . Depression 04/03/2013  . Hyperlipemia 12/14/2012  . Vitamin D deficiency 12/14/2012  . Allergic rhinitis 12/14/2012  . 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 Osteopenia     Stark Bray 10/29/2019, 5:19 PM  [place], [place], [address] Phone: [phone]   Fax:  [phone]  Name: Kayla Randolph MRN: [id number] Date of Birth: [birth year]

## 2019-11-01 ENCOUNTER — Other Ambulatory Visit: Payer: Self-pay

## 2019-11-01 ENCOUNTER — Ambulatory Visit: Payer: Medicare Other | Admitting: Physical Therapy

## 2019-11-01 DIAGNOSIS — M25612 Stiffness of left shoulder, not elsewhere classified: Secondary | ICD-10-CM

## 2019-11-01 DIAGNOSIS — I890 Lymphedema, not elsewhere classified: Secondary | ICD-10-CM

## 2019-11-01 DIAGNOSIS — R293 Abnormal posture: Secondary | ICD-10-CM

## 2019-11-01 DIAGNOSIS — Z483 Aftercare following surgery for neoplasm: Secondary | ICD-10-CM

## 2019-11-01 NOTE — Therapy (Signed)
[place], [place], [address] Phone: [phone]   Fax:  [phone]  Physical Therapy Treatment  Patient Details  Name: Kayla Randolph MRN: [id number] Date of Birth: 12-Aug-1945 Referring Provider (PT): Lurline Del MD   Encounter Date: 11/01/2019   PT End of Session - 11/01/19 1456    Visit Number 8    Number of Visits 11    Date for PT Re-Evaluation 11/07/19    PT Start Time 3382    PT Stop Time 1448    PT Time Calculation (min) 43 min    Activity Tolerance Patient tolerated treatment well    Behavior During Therapy [facility] for tasks assessed/performed           Past Medical History:  Diagnosis Date  . Allergy   . Anxiety   . Breast cancer ([place])   . Cataract   . Depression   . History of colon polyps   . Hyperlipidemia   . Hypothyroidism   . Interstitial cystitis    History of  . Osteopenia   . S/P lumpectomy, left breast 04/03/2019  . Skin cancer    removed from left nostril  . Vitamin D deficiency     Past Surgical History:  Procedure Laterality Date  . BREAST LUMPECTOMY WITH RADIOACTIVE SEED AND SENTINEL LYMPH NODE BIOPSY Left 04/03/2019   Procedure: LEFT BREAST LUMPECTOMY WITH RADIOACTIVE SEED AND SENTINEL LYMPH NODE MAPPING;  Surgeon: Erroll Luna, MD;  Location: [place];  Service: General;  Laterality: Left;  . CESAREAN SECTION    . EYE SURGERY Right    cataract  . TONSILLECTOMY      There were no vitals filed for this visit.   Subjective Assessment - 11/01/19 1445    Subjective Pt states she is doing well with her exercises.  She has some trouble with MLD below her breast to her groin    Pertinent History Patient was diagnosed on 03/15/2019 with left invasive ductal carcinoma breast cancer. Patient underwent a left lumpectomy and sentinel node biopsy (2 negative nodes) on 04/03/2019. It is ER/PR positive and HER2 negative with a Ki67 of 30%.     Patient Stated Goals I want to stretch out my cord or learn how to stretch it out or both.    Currently in Pain? No/denies                             [facility] Adult PT Treatment/Exercise - 11/01/19 0001      Manual Therapy   Manual therapy comments issued piece of thin cellona and small dotted foam for pt to wear inside bra at scar site    Edema Management reinforced self MLD technique to lateral breast near scar with pt in sidelying, and used a mirror for her to see what she was doing.  Pt was able to perform techniqu    Manual Lymphatic Drainage (MLD) In supine: short neck, bil axillary nodes and establishment of interaxillary pathway, left inguinal nodes and establishment of axillo inguinal pathway, left breast moving fluid towards pathways with focus on inferior breast where fibrosis is present then retracing all steps and deep abdominals. Then sidelying posterior interaxillary work to lateral breast                  PT Education - 11/01/19 1455    Education Details reinforeced self MLD technique in sidelying for lateral breast and  axillo-inguinal anastamosis    Person(s) Educated Patient    Methods Demonstration;Tactile cues;Verbal cues;Explanation;Other (comment)   used mirror   Comprehension Returned demonstration            PT Short Term Goals - 10/03/19 1704      PT SHORT TERM GOAL #1   Title Pt will be independent with HEP and with MLD of the L breast and UE.    Baseline Pt was not performing HEP    Time 2    Period Weeks    Status New    Target Date 10/24/19             PT Long Term Goals - 10/03/19 1704      PT LONG TERM GOAL #1   Title Pt will demonstrate 150 degrees of L shoulder abduction and flexion in order to demonstrate improve functional ROM.    Baseline L shoulder flexion 135, abduction 133    Time 5    Period Weeks    Status New    Target Date 11/14/19      PT LONG TERM GOAL #2   Title Pt will demonstrate appropriate fit  of compression bra/garments to wear on a daily basis for home management of edema.    Baseline pt currently is not wearing a compression bra.    Time 5    Period Weeks    Status New    Target Date 11/14/19      PT LONG TERM GOAL #3   Title Pt will demonstrate 3 cm decrease in circumferential measurement of the L breast in order to demonstrate decrease fluid build up in the L breast to decrease risk for infection and LUE lymphedema.    Baseline See measurements.    Time 5    Period Weeks    Status New    Target Date 11/14/19                 Plan - 11/01/19 1456    Clinical Impression Statement Palpable and visible reduction in brast lymphedema and congestion near scar after treatment today.  No cording in axilla ready to discharge soon    Comorbidities Lumpectomy with 2 lymph nodes remove and radiation that she finished 06/11/2019    Rehab Potential Good    PT Frequency 2x / week    PT Treatment/Interventions Moist Heat;Therapeutic exercise;Therapeutic activities;Neuromuscular re-education;Manual techniques    PT Next Visit Plan assess indep with supine scap, continue working on MLD in the L breast. Continue with STM over cording if present with movement.  consider discharge soon    Consulted and Agree with Plan of Care Patient           Patient will benefit from skilled therapeutic intervention in order to improve the following deficits and impairments:  Increased edema, Increased fascial restricitons, Decreased range of motion, Pain, Postural dysfunction  Visit Diagnosis: Stiffness of left shoulder, not elsewhere classified  Abnormal posture  Aftercare following surgery for neoplasm  Lymphedema, not elsewhere classified     Problem List Patient Active Problem List   Diagnosis Date Noted  . Unspecified inflammatory spondylopathy, cervical region ([place]) 06/21/2019  . Malignant neoplasm of upper-inner quadrant of left breast in female, estrogen receptor positive  ([place]) 03/16/2019  . Primary osteoarthritis involving multiple joints 10/04/2017  . Hypothyroidism due to acquired atrophy of thyroid 05/31/2017  . Depression 04/03/2013  . Hyperlipemia 12/14/2012  . Vitamin D deficiency 12/14/2012  . Allergic rhinitis 12/14/2012  . Cervical  spine arthritis 12/14/2012  . 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 Osteopenia    Donato Heinz. Owens Shark PT  Norwood Levo 11/01/2019, 2:59 PM  [place], [place], [address] Phone: [phone]   Fax:  [phone]  Name: Kayla Randolph MRN: [id number] Date of Birth: 1946-03-16

## 2019-11-05 ENCOUNTER — Other Ambulatory Visit: Payer: Self-pay

## 2019-11-05 ENCOUNTER — Encounter (INDEPENDENT_AMBULATORY_CARE_PROVIDER_SITE_OTHER): Payer: Self-pay

## 2019-11-05 ENCOUNTER — Encounter: Payer: Self-pay | Admitting: Physical Therapy

## 2019-11-05 ENCOUNTER — Ambulatory Visit: Payer: Medicare Other | Admitting: Physical Therapy

## 2019-11-05 DIAGNOSIS — I890 Lymphedema, not elsewhere classified: Secondary | ICD-10-CM

## 2019-11-05 DIAGNOSIS — M25612 Stiffness of left shoulder, not elsewhere classified: Secondary | ICD-10-CM

## 2019-11-05 NOTE — Therapy (Signed)
[place], [place], [address] Phone: [phone]   Fax:  [phone]  Physical Therapy Treatment  Patient Details  Name: Kayla Randolph MRN: [id number] Date of Birth: 1945-07-02 Referring Provider (PT): Lurline Del MD   Encounter Date: 11/05/2019   PT End of Session - 11/05/19 1455    Visit Number 9    Number of Visits 11    Date for PT Re-Evaluation 11/07/19    PT Start Time 4270    PT Stop Time 1454    PT Time Calculation (min) 50 min    Activity Tolerance Patient tolerated treatment well    Behavior During Therapy [facility] for tasks assessed/performed           Past Medical History:  Diagnosis Date  . Allergy   . Anxiety   . Breast cancer ([place])   . Cataract   . Depression   . History of colon polyps   . Hyperlipidemia   . Hypothyroidism   . Interstitial cystitis    History of  . Osteopenia   . S/P lumpectomy, left breast 04/03/2019  . Skin cancer    removed from left nostril  . Vitamin D deficiency     Past Surgical History:  Procedure Laterality Date  . BREAST LUMPECTOMY WITH RADIOACTIVE SEED AND SENTINEL LYMPH NODE BIOPSY Left 04/03/2019   Procedure: LEFT BREAST LUMPECTOMY WITH RADIOACTIVE SEED AND SENTINEL LYMPH NODE MAPPING;  Surgeon: Erroll Luna, MD;  Location: [place];  Service: General;  Laterality: Left;  . CESAREAN SECTION    . EYE SURGERY Right    cataract  . TONSILLECTOMY      There were no vitals filed for this visit.   Subjective Assessment - 11/05/19 1405    Subjective I have been sore since my last appointment. It is starting to feel better today. I haven't done my stretches yet.    Pertinent History Patient was diagnosed on 03/15/2019 with left invasive ductal carcinoma breast cancer. Patient underwent a left lumpectomy and sentinel node biopsy (2 negative nodes) on 04/03/2019. It is ER/PR positive and HER2 negative with a Ki67 of 30%.     Patient Stated Goals I want to stretch out my cord or learn how to stretch it out or both.    Currently in Pain? No/denies                             [facility] Adult PT Treatment/Exercise - 11/05/19 0001      Manual Therapy   Manual Lymphatic Drainage (MLD) In supine: short neck, right axillary nodes and establishment of interaxillary pathway, left inguinal nodes and establishment of axillo inguinal pathway, left breast moving fluid towards pathways with focus on inferior breast where fibrosis is present then retracing all steps    Passive ROM at end range in to flexion and abduction                    PT Short Term Goals - 10/03/19 1704      PT SHORT TERM GOAL #1   Title Pt will be independent with HEP and with MLD of the L breast and UE.    Baseline Pt was not performing HEP    Time 2    Period Weeks    Status New    Target Date 10/24/19             PT  Long Term Goals - 10/03/19 1704      PT LONG TERM GOAL #1   Title Pt will demonstrate 150 degrees of L shoulder abduction and flexion in order to demonstrate improve functional ROM.    Baseline L shoulder flexion 135, abduction 133    Time 5    Period Weeks    Status New    Target Date 11/14/19      PT LONG TERM GOAL #2   Title Pt will demonstrate appropriate fit of compression bra/garments to wear on a daily basis for home management of edema.    Baseline pt currently is not wearing a compression bra.    Time 5    Period Weeks    Status New    Target Date 11/14/19      PT LONG TERM GOAL #3   Title Pt will demonstrate 3 cm decrease in circumferential measurement of the L breast in order to demonstrate decrease fluid build up in the L breast to decrease risk for infection and LUE lymphedema.    Baseline See measurements.    Time 5    Period Weeks    Status New    Target Date 11/14/19                 Plan - 11/05/19 1456    Clinical Impression Statement Pt reports that fibrosis  improved after last session but by the evening it had returned. She had some soreness after last session but it was improving today. Continued with MLD to left breast with focus on area of fibrosis. Also did PROM to L shoulder at end range. Plan is to discharge at next session.    PT Frequency 2x / week    PT Duration --   5 weeks   PT Treatment/Interventions Moist Heat;Therapeutic exercise;Therapeutic activities;Neuromuscular re-education;Manual techniques    PT Next Visit Plan assess indep with supine scap, continue working on MLD in the L breast. Continue with STM over cording if present with movement.  consider discharge soon    PT Home Exercise Plan Post op exercises and wall slide into flexion/abduction in the shower, supine scap    Consulted and Agree with Plan of Care Patient           Patient will benefit from skilled therapeutic intervention in order to improve the following deficits and impairments:  Increased edema, Increased fascial restricitons, Decreased range of motion, Pain, Postural dysfunction  Visit Diagnosis: Lymphedema, not elsewhere classified  Stiffness of left shoulder, not elsewhere classified     Problem List Patient Active Problem List   Diagnosis Date Noted  . Unspecified inflammatory spondylopathy, cervical region ([place]) 06/21/2019  . Malignant neoplasm of upper-inner quadrant of left breast in female, estrogen receptor positive ([place]) 03/16/2019  . Primary osteoarthritis involving multiple joints 10/04/2017  . Hypothyroidism due to acquired atrophy of thyroid 05/31/2017  . Depression 04/03/2013  . Hyperlipemia 12/14/2012  . Vitamin D deficiency 12/14/2012  . Allergic rhinitis 12/14/2012  . 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 Osteopenia     Allyson Sabal [facility] 11/05/2019, 2:59 PM  [place], [place], [address] Phone:  [phone]   Fax:  [phone]  Name: SHAWNIA VIZCARRONDO MRN: [id number] Date of Birth: Aug 31, 1945  Allyson Sabal Seibert, PT 11/05/19 2:59 PM

## 2019-11-07 ENCOUNTER — Other Ambulatory Visit: Payer: Self-pay

## 2019-11-07 ENCOUNTER — Ambulatory Visit: Payer: Medicare Other | Admitting: Physical Therapy

## 2019-11-07 ENCOUNTER — Encounter: Payer: Self-pay | Admitting: Physical Therapy

## 2019-11-07 DIAGNOSIS — R293 Abnormal posture: Secondary | ICD-10-CM

## 2019-11-07 DIAGNOSIS — I890 Lymphedema, not elsewhere classified: Secondary | ICD-10-CM | POA: Diagnosis not present

## 2019-11-07 DIAGNOSIS — M25612 Stiffness of left shoulder, not elsewhere classified: Secondary | ICD-10-CM

## 2019-11-07 NOTE — Therapy (Signed)
[place], [place], [address] Phone: [phone]   Fax:  [phone]  Physical Therapy Treatment  Patient Details  Name: Kayla Randolph MRN: [id number] Date of Birth: June 11, 1945 Referring Provider (PT): Lurline Del MD   Encounter Date: 11/07/2019   PT End of Session - 11/07/19 1454    Visit Number 10    Number of Visits 11    Date for PT Re-Evaluation 11/07/19    PT Start Time 0459    PT Stop Time 1453    PT Time Calculation (min) 56 min    Activity Tolerance Patient tolerated treatment well    Behavior During Therapy [facility] for tasks assessed/performed           Past Medical History:  Diagnosis Date  . Allergy   . Anxiety   . Breast cancer ([place])   . Cataract   . Depression   . History of colon polyps   . Hyperlipidemia   . Hypothyroidism   . Interstitial cystitis    History of  . Osteopenia   . S/P lumpectomy, left breast 04/03/2019  . Skin cancer    removed from left nostril  . Vitamin D deficiency     Past Surgical History:  Procedure Laterality Date  . BREAST LUMPECTOMY WITH RADIOACTIVE SEED AND SENTINEL LYMPH NODE BIOPSY Left 04/03/2019   Procedure: LEFT BREAST LUMPECTOMY WITH RADIOACTIVE SEED AND SENTINEL LYMPH NODE MAPPING;  Surgeon: Erroll Luna, MD;  Location: [place];  Service: General;  Laterality: Left;  . CESAREAN SECTION    . EYE SURGERY Right    cataract  . TONSILLECTOMY      There were no vitals filed for this visit.   Subjective Assessment - 11/07/19 1358    Subjective Today I am not having trouble but other times it feels tight.    Pertinent History Patient was diagnosed on 03/15/2019 with left invasive ductal carcinoma breast cancer. Patient underwent a left lumpectomy and sentinel node biopsy (2 negative nodes) on 04/03/2019. It is ER/PR positive and HER2 negative with a Ki67 of 30%.    Patient Stated Goals I want to stretch out my cord  or learn how to stretch it out or both.    Currently in Pain? No/denies                 LYMPHEDEMA/ONCOLOGY QUESTIONNAIRE - 11/07/19 0001      Left Upper Extremity Lymphedema   Other 35 cm from the base of the L breast circumferentially around the breast                       [facility] Adult PT Treatment/Exercise - 11/07/19 0001      Shoulder Exercises: Supine   Horizontal ABduction Strengthening;Both;10 reps;Theraband    Theraband Level (Shoulder Horizontal ABduction) Level 2 (Red)    Horizontal ABduction Limitations pt able to demonstrate independently today    External Rotation Strengthening;Both;10 reps;Theraband    Theraband Level (Shoulder External Rotation) Level 2 (Red)    External Rotation Limitations v/c to increase tension on band and to turn palms upwards throughout    Flexion Strengthening;Both;10 reps;Theraband   narrow and wide grip   Theraband Level (Shoulder Flexion) Level 2 (Red)    Flexion Limitations pt was independent and did not require any v/c    Diagonals Strengthening;Both;10 reps;Theraband    Theraband Level (Shoulder Diagonals) Level 2 (Red)      Manual Therapy  Manual Lymphatic Drainage (MLD) Pt able to demonstrate the following only requiring verbal cues for direction of skin stretch and how to do MLD on inferior breast: In supine: short neck, right axillary nodes and establishment of interaxillary pathway, left inguinal nodes and establishment of axillo inguinal pathway, left breast moving fluid towards pathways with focus on inferior breast where fibrosis is present then retracing all steps - pt demonstrated for half of the time and therapist took over for 15 minutes at the end once pt demonstrated independence with self MLD                    PT Short Term Goals - 11/07/19 1359      PT SHORT TERM GOAL #1   Title Pt will be independent with HEP and with MLD of the L breast and UE.    Baseline 11/07/19- pt is independent with  this now    Time 2    Period Weeks    Status Achieved    Target Date 10/24/19             PT Long Term Goals - 11/07/19 1400      PT LONG TERM GOAL #1   Title Pt will demonstrate 150 degrees of L shoulder abduction and flexion in order to demonstrate improve functional ROM.    Baseline L shoulder flexion 135, abduction 133, 11/07/19- 161 degrees    Time 5    Period Weeks    Status Achieved      PT LONG TERM GOAL #2   Title Pt will demonstrate appropriate fit of compression bra/garments to wear on a daily basis for home management of edema.    Baseline pt currently is not wearing a compression bra., 11/07/19- pt has obtained a compression bra    Time 5    Period Weeks    Status Achieved      PT LONG TERM GOAL #3   Title Pt will demonstrate 3 cm decrease in circumferential measurement of the L breast in order to demonstrate decrease fluid build up in the L breast to decrease risk for infection and LUE lymphedema.    Baseline See measurements, 11/07/19- different therapist measured today and circumferential measurement had not reduced per this therapist measurement though pt reports overall it has gotten smaller    Time 5    Period Weeks    Status Achieved      PT LONG TERM GOAL #4   Title Patient will improve left shoulder active abduction to >/= 160 degrees for increased ease reaching overhead and to return to baseline.    Baseline 146 degrees abd 06/06/19; 163 degrees - 06/25/19    Time 4    Period Weeks    Status Achieved      PT LONG TERM GOAL #5   Title Patient will verbalize good understanding of lymphedema risk reduction practices.    Baseline Pt is able to verbalize good understanding of lymphedem risk reduction practices including compression with wearing her sleeve, doing her exercises and self MLD and has taken ABC class- 06/25/19    Status Achieved                 Plan - 11/07/19 1455    Clinical Impression Statement Assessed pt's progress towards goals in  therapy. Pt has now met all goals for therapy. Had pt demonstrate independence with HEP. Pt was able to perform 10 reps of all scapular strengthening exercises independently, only requring verbal  cueing for correct form with external rotation with theraband. Pt also demonstrated how she is doing self MLD. She required cueing for proper skin stretch and direction as well as how to address swelling in inferior breast and was independent by end of session. Pt will now be discharged from skilled PT services due to meeting all goals and being able to manage independently.    PT Frequency 2x / week    PT Duration --   5 weeks   PT Treatment/Interventions Moist Heat;Therapeutic exercise;Therapeutic activities;Neuromuscular re-education;Manual techniques    PT Next Visit Plan d/c this visit    PT Home Exercise Plan Post op exercises and wall slide into flexion/abduction in the shower, supine scap, self MLD    Consulted and Agree with Plan of Care Patient           Patient will benefit from skilled therapeutic intervention in order to improve the following deficits and impairments:  Increased edema, Increased fascial restricitons, Decreased range of motion, Pain, Postural dysfunction  Visit Diagnosis: Stiffness of left shoulder, not elsewhere classified  Lymphedema, not elsewhere classified  Abnormal posture     Problem List Patient Active Problem List   Diagnosis Date Noted  . Unspecified inflammatory spondylopathy, cervical region ([place]) 06/21/2019  . Malignant neoplasm of upper-inner quadrant of left breast in female, estrogen receptor positive ([place]) 03/16/2019  . Primary osteoarthritis involving multiple joints 10/04/2017  . Hypothyroidism due to acquired atrophy of thyroid 05/31/2017  . Depression 04/03/2013  . Hyperlipemia 12/14/2012  . Vitamin D deficiency 12/14/2012  . Allergic rhinitis 12/14/2012  . 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  Osteopenia     Allyson Sabal [facility] 11/07/2019, 2:58 PM  [place], [place], [address] Phone: [phone]   Fax:  [phone]  Name: Kayla Randolph MRN: [id number] Date of Birth: 1945-09-19  PHYSICAL THERAPY DISCHARGE SUMMARY  Visits from Start of Care: 10  Current functional level related to goals / functional outcomes: All goals met   Remaining deficits: None   Education / Equipment: HEP, compression bra, self MLD  Plan: Patient agrees to discharge.  Patient goals were met. Patient is being discharged due to meeting the stated rehab goals.  ?????     Allyson Sabal [place], [place] 11/07/19 2:59 PM

## 2019-11-12 ENCOUNTER — Encounter (INDEPENDENT_AMBULATORY_CARE_PROVIDER_SITE_OTHER): Payer: Self-pay

## 2019-11-19 ENCOUNTER — Encounter (INDEPENDENT_AMBULATORY_CARE_PROVIDER_SITE_OTHER): Payer: Self-pay

## 2019-11-26 ENCOUNTER — Encounter (INDEPENDENT_AMBULATORY_CARE_PROVIDER_SITE_OTHER): Payer: Self-pay

## 2019-12-03 ENCOUNTER — Encounter (INDEPENDENT_AMBULATORY_CARE_PROVIDER_SITE_OTHER): Payer: Self-pay

## 2019-12-03 ENCOUNTER — Ambulatory Visit (INDEPENDENT_AMBULATORY_CARE_PROVIDER_SITE_OTHER): Payer: Medicare Other | Admitting: *Deleted

## 2019-12-03 ENCOUNTER — Other Ambulatory Visit: Payer: Self-pay | Admitting: *Deleted

## 2019-12-03 DIAGNOSIS — Z0000 Encounter for general adult medical examination without abnormal findings: Secondary | ICD-10-CM

## 2019-12-03 MED ORDER — FLUTICASONE PROPIONATE 50 MCG/ACT NA SUSP: NASAL | 1 refills | Status: DC

## 2019-12-03 NOTE — Progress Notes (Signed)
MEDICARE ANNUAL WELLNESS VISIT  12/03/2019  Telephone Visit Disclaimer This Medicare AWV was conducted by telephone due to national recommendations for restrictions regarding the COVID-19 Pandemic (e.g. social distancing).  I verified, using two identifiers, that I am speaking with Kayla Randolph or their authorized healthcare agent. I discussed the limitations, risks, security, and privacy concerns of performing an evaluation and management service by telephone and the potential availability of an in-person appointment in the future. The patient expressed understanding and agreed to proceed.   Subjective:  Kayla Randolph is a 74 y.o. female patient of Janora Norlander, DO who had a Medicare Annual Wellness Visit today via telephone. Tinna is Retired and lives with their spouse. she has 2 children. she reports that she is socially active and does interact with friends/family regularly. she is moderately physically active and enjoys reading.  Patient Care Team: Janora Norlander, DO as PCP - General (Family Medicine) Rutherford Guys, MD as Consulting Physician (Ophthalmology) Clarene Essex, MD as Consulting Physician (Gastroenterology) Lavonna Monarch, MD as Consulting Physician (Dermatology) Mauro Kaufmann, RN as Oncology Nurse Navigator Carlynn Spry, Charlott Holler, RN as Oncology Nurse Navigator Magrinat, Virgie Dad, MD as Consulting Physician (Oncology) Rutherford Guys, MD as Consulting Physician (Ophthalmology) Kyung Rudd, MD as Consulting Physician (Radiation Oncology) Erroll Luna, MD as Consulting Physician (General Surgery)  Advanced Directives 12/03/2019 10/03/2019 04/25/2019 04/03/2019 03/28/2019 03/21/2019 12/01/2018  Does Patient Have a Medical Advance Directive? Yes Yes No;Yes Yes Yes Yes Yes  Type of Paramedic of [place];Living will - Living will;Healthcare Power of [place];Living will [place];Living will  [place]  Does patient want to make changes to medical advance directive? No - Patient declined Yes (MAU/Ambulatory/Procedural Areas - Information given) No - Patient declined No - Patient declined No - Patient declined No - Patient declined No - Patient declined  Copy of [place] in Chart? No - copy requested - No - copy requested No - copy requested - No - copy requested No - copy requested  Would patient like information on creating a medical advance directive? - - No - Patient declined - - - No - Patient declined    Hospital Utilization Over the Past 12 Months: # of hospitalizations or ER visits: 0 # of surgeries: 4  Review of Systems    Patient reports that her overall health is unchanged compared to last year.  History obtained from chart review and the patient  Patient Reported Readings (BP, Pulse, CBG, Weight, etc) none  Pain Assessment Pain : No/denies pain     Current Medications & Allergies (verified) Allergies as of 12/03/2019      Reactions   Macrodantin    Sulfa Antibiotics    Wellbutrin [bupropion Hcl]       Medication List       Accurate as of December 03, 2019 11:15 AM. If you have any questions, ask your nurse or doctor.        cycloSPORINE 0.05 % ophthalmic emulsion Commonly known as: RESTASIS Place 1 drop into both eyes 2 (two) times daily.   escitalopram 10 MG tablet Commonly known as: LEXAPRO TAKE (1) TABLET DAILY AS DIRECTED.   estradiol 0.1 MG/GM vaginal cream Commonly known as: ESTRACE INSERT 1 VAGINALLY AT BEDTIME   fluticasone 50 MCG/ACT nasal spray Commonly known as: FLONASE 1 SPRAY IN EACH NOSTRIL EVERY 12 HOURS   levothyroxine  25 MCG tablet Commonly known as: SYNTHROID TAKE 1 TABLET EVERY DAY BEFORE BREAKFAST   MIRALAX PO Take 1 packet by mouth daily as needed.   One-A-Day Bone Strength 500-28-100 MG-MG-UNIT Tabs Take 1 tablet by mouth 3 (three) times  daily.   rosuvastatin 5 MG tablet Commonly known as: CRESTOR TAKE (1) TABLET DAILY AS DIRECTED.   SUPER OMEGA-3 PO Take by mouth.   tamoxifen 20 MG tablet Commonly known as: NOLVADEX Take 20 mg by mouth daily.   TURMERIC COMPLEX/BLACK PEPPER PO Take by mouth.   UNABLE TO FIND Med Name: Prelief Bladder   Vitamin D3 25 MCG (1000 UT) Caps Take 2 capsules by mouth daily.   WOMENS 50+ MULTI VITAMIN/MIN PO Take by mouth.       History (reviewed): Past Medical History:  Diagnosis Date  . Allergy   . Anxiety   . Breast cancer ([place])   . Cataract   . Depression   . History of colon polyps   . Hyperlipidemia   . Hypothyroidism   . Interstitial cystitis    History of  . Osteopenia   . S/P lumpectomy, left breast 04/03/2019  . Skin cancer    removed from left nostril  . Vitamin D deficiency    Past Surgical History:  Procedure Laterality Date  . BREAST LUMPECTOMY WITH RADIOACTIVE SEED AND SENTINEL LYMPH NODE BIOPSY Left 04/03/2019   Procedure: LEFT BREAST LUMPECTOMY WITH RADIOACTIVE SEED AND SENTINEL LYMPH NODE MAPPING;  Surgeon: Erroll Luna, MD;  Location: [facility];  Service: General;  Laterality: Left;  . CESAREAN SECTION    . EYE SURGERY Right    cataract  . TONSILLECTOMY     Family History  Problem Relation Age of Onset  . Heart attack Father   . Congestive Heart Failure Father   . Osteoporosis Maternal Aunt   . Uterine cancer Maternal Aunt   . Cancer Mother        possible lung - spread   . Early death Mother   . Stroke Maternal Grandmother   . Breast cancer Paternal Grandmother   . Stroke Paternal Grandfather    Social History   Socioeconomic History  . Marital status: Married    Spouse name: Ed  . Number of children: 2  . Years of education: 16  . Highest education level: Bachelor's degree (e.g., BA, AB, BS)  Occupational History  . Occupation: Retired    Comment: Geophysicist/field seismologist  Tobacco Use  . Smoking status:  Never Smoker  . Smokeless tobacco: Never Used  Vaping Use  . Vaping Use: Never used  Substance and Sexual Activity  . Alcohol use: Yes    Alcohol/week: 7.0 standard drinks    Types: 7 Standard drinks or equivalent per week    Comment: wine nightly  . Drug use: No  . Sexual activity: Yes    Birth control/protection: Post-menopausal  Other Topics Concern  . Not on file  Social History Narrative   Lives with Ed   2 cats    2 children = neither local    Social Determinants of Health   Financial Resource Strain: Low Risk   . Difficulty of Paying Living Expenses: Not hard at all  Food Insecurity: No Food Insecurity  . Worried About Charity fundraiser in the Last Year: Never true  . Ran Out of Food in the Last Year: Never true  Transportation Needs: No Transportation Needs  . Lack of Transportation (Medical): No  .  Lack of Transportation (Non-Medical): No  Physical Activity: Sufficiently Active  . Days of Exercise per Week: 5 days  . Minutes of Exercise per Session: 30 min  Stress: No Stress Concern Present  . Feeling of Stress : Only a little  Social Connections: Socially Integrated  . Frequency of Communication with Friends and Family: More than three times a week  . Frequency of Social Gatherings with Friends and Family: More than three times a week  . Attends Religious Services: More than 4 times per year  . Active Member of Clubs or Organizations: Yes  . Attends Archivist Meetings: More than 4 times per year  . Marital Status: Married    Activities of Daily Living In your present state of health, do you have any difficulty performing the following activities: 12/03/2019  Hearing? N  Vision? Y  Comment had cataracts removed from both eyes this year  Difficulty concentrating or making decisions? N  Walking or climbing stairs? N  Dressing or bathing? N  Doing errands, shopping? N  Preparing Food and eating ? N  Using the Toilet? N  In the past six months,  have you accidently leaked urine? Y  Comment maybe once  Do you have problems with loss of bowel control? N  Managing your Medications? N  Managing your Finances? N  Housekeeping or managing your Housekeeping? N  Comment has someone that comes in to clean-but she is able to do it  Some recent data might be hidden    Patient Education/ Literacy How often do you need to have someone help you when you read instructions, pamphlets, or other written materials from your doctor or pharmacy?: 1 - Never What is the last grade level you completed in school?: Bachelors Degree  Exercise Current Exercise Habits: Home exercise routine, Type of exercise: stretching;walking, Time (Minutes): 30, Frequency (Times/Week): 5, Weekly Exercise (Minutes/Week): 150, Intensity: Mild, Exercise limited by: orthopedic condition(s)  Diet Patient reports consuming 3 meals a day and 0 snack(s) a day Patient reports that her primary diet is: Regular Patient reports that she does have regular access to food.   Depression Screen PHQ 2/9 Scores 12/03/2019 09/07/2019 08/27/2019 03/01/2019 12/01/2018 06/21/2018 02/15/2018  PHQ - 2 Score 0 0 0 1 0 0 0  PHQ- 9 Score - - - 1 - - -     Fall Risk Fall Risk  12/03/2019 09/07/2019 08/27/2019 03/01/2019 12/01/2018  Falls in the past year? 0 0 0 0 0  Number falls in past yr: - - - - -  Injury with Fall? - - - - -  Risk Factor Category  - - - - -  Risk for fall due to : - - - - -  Follow up - - - - -     Objective:  Martin seemed alert and oriented and she participated appropriately during our telephone visit.  Blood Pressure Weight BMI  BP Readings from Last 3 Encounters:  10/02/19 (!) 141/73  09/07/19 137/71  08/27/19 (!) 110/59   Wt Readings from Last 3 Encounters:  10/02/19 113 lb 6.4 oz (51.4 kg)  09/07/19 111 lb 9.6 oz (50.6 kg)  08/27/19 112 lb 3.2 oz (50.9 kg)   BMI Readings from Last 1 Encounters:  10/02/19 18.87 kg/m    *Unable to obtain current vital  signs, weight, and BMI due to telephone visit type  Hearing/Vision  . Sophi did not seem to have difficulty with hearing/understanding during the telephone conversation .  Reports that she has had a formal eye exam by an eye care professional within the past year . Reports that she has not had a formal hearing evaluation within the past year *Unable to fully assess hearing and vision during telephone visit type  Cognitive Function: 6CIT Screen 12/03/2019 12/01/2018  What Year? 0 points 0 points  What month? 0 points 0 points  What time? 0 points 0 points  Count back from 20 0 points 0 points  Months in reverse 0 points 0 points  Repeat phrase 0 points 0 points  Total Score 0 0   (Normal:0-7, Significant for Dysfunction: >8)  Normal Cognitive Function Screening: Yes   Immunization & Health Maintenance Record Immunization History  Administered Date(s) Administered  . Fluad Quad(high Dose 65+) 03/01/2019  . Influenza Whole 02/14/2010  . Influenza, High Dose Seasonal PF 03/18/2016, 02/17/2017, 03/07/2018  . Influenza,inj,Quad PF,6+ Mos 04/03/2013, 03/14/2014, 03/28/2015  . PFIZER SARS-COV-2 Vaccination 06/05/2019, 06/28/2019  . Pneumococcal Conjugate-13 05/15/2013  . Pneumococcal Polysaccharide-23 08/15/2009  . Td 06/09/2018  . Tdap 05/18/2007  . Zoster 06/09/2006  . Zoster Recombinat (Shingrix) 10/07/2016, 03/24/2017    Health Maintenance  Topic Date Due  . COLON CANCER SCREENING ANNUAL FOBT  03/02/2019  . INFLUENZA VACCINE  12/16/2019  . PAP SMEAR-Modifier  09/06/2021  . COLONOSCOPY  11/14/2021  . TETANUS/TDAP  06/09/2028  . DEXA SCAN  Completed  . COVID-19 Vaccine  Completed  . Hepatitis C Screening  Completed  . PNA vac Low Risk Adult  Completed       Assessment  This is a routine wellness examination for [employer].  Health Maintenance: Due or Overdue Health Maintenance Due  Topic Date Due  . COLON CANCER SCREENING ANNUAL FOBT  03/02/2019    Kayla Randolph does not need a referral for Community Assistance: Care Management:   no Social Work:    no Prescription Assistance:  no Nutrition/Diabetes Education:  no   Plan:  Personalized Goals Goals Addressed            This Visit's Progress   . DIET - INCREASE WATER INTAKE       Try to drink 6-8 glasses of water daily      Personalized Health Maintenance & Screening Recommendations  Advanced directives: has an advanced directive - a copy HAS NOT been provided.  Lung Cancer Screening Recommended: no (Low Dose CT Chest recommended if Age 21-80 years, 30 pack-year currently smoking OR have quit w/in past 15 years) Hepatitis C Screening recommended: no HIV Screening recommended: no  Advanced Directives: Written information was not prepared per patient's request.  Referrals & Orders No orders of the defined types were placed in this encounter.   Follow-up Plan . Follow-up with Janora Norlander, DO as planned . Keep all appointments with your specialty doctors . Bring a copy of your Advanced Directives in for our records   I have personally reviewed and noted the following in the patient's chart:   . Medical and social history . Use of alcohol, tobacco or illicit drugs  . Current medications and supplements . Functional ability and status . Nutritional status . Physical activity . Advanced directives . List of other physicians . Hospitalizations, surgeries, and ER visits in previous 12 months . Vitals . Screenings to include cognitive, depression, and falls . Referrals and appointments  In addition, I have reviewed and discussed with Kayla Randolph certain preventive protocols, quality metrics, and best practice recommendations. A written personalized  care plan for preventive services as well as general preventive health recommendations is available and can be mailed to the patient at her request.      Milas Hock, LPN  09/15/7140

## 2019-12-03 NOTE — Patient Instructions (Signed)
Preventive Care 75 Years and Older, Female Preventive care refers to lifestyle choices and visits with your health care provider that can promote health and wellness. This includes:  A yearly physical exam. This is also called an annual well check.  Regular dental and eye exams.  Immunizations.  Screening for certain conditions.  Healthy lifestyle choices, such as diet and exercise. What can I expect for my preventive care visit? Physical exam Your health care provider will check:  Height and weight. These may be used to calculate body mass index (BMI), which is a measurement that tells if you are at a healthy weight.  Heart rate and blood pressure.  Your skin for abnormal spots. Counseling Your health care provider may ask you questions about:  Alcohol, tobacco, and drug use.  Emotional well-being.  Home and relationship well-being.  Sexual activity.  Eating habits.  History of falls.  Memory and ability to understand (cognition).  Work and work Statistician.  Pregnancy and menstrual history. What immunizations do I need?  Influenza (flu) vaccine  This is recommended every year. Tetanus, diphtheria, and pertussis (Tdap) vaccine  You may need a Td booster every 10 years. Varicella (chickenpox) vaccine  You may need this vaccine if you have not already been vaccinated. Zoster (shingles) vaccine  You may need this after age 75. Pneumococcal conjugate (PCV13) vaccine  One dose is recommended after age 75. Pneumococcal polysaccharide (PPSV23) vaccine  One dose is recommended after age 75. Measles, mumps, and rubella (MMR) vaccine  You may need at least one dose of MMR if you were born in 1957 or later. You may also need a second dose. Meningococcal conjugate (MenACWY) vaccine  You may need this if you have certain conditions. Hepatitis A vaccine  You may need this if you have certain conditions or if you travel or work in places where you may be exposed  to hepatitis A. Hepatitis B vaccine  You may need this if you have certain conditions or if you travel or work in places where you may be exposed to hepatitis B. Haemophilus influenzae type b (Hib) vaccine  You may need this if you have certain conditions. You may receive vaccines as individual doses or as more than one vaccine together in one shot (combination vaccines). Talk with your health care provider about the risks and benefits of combination vaccines. What tests do I need? Blood tests  Lipid and cholesterol levels. These may be checked every 5 years, or more frequently depending on your overall health.  Hepatitis C test.  Hepatitis B test. Screening  Lung cancer screening. You may have this screening every year starting at age 75 if you have a 30-pack-year history of smoking and currently smoke or have quit within the past 15 years.  Colorectal cancer screening. All adults should have this screening starting at age 75 and continuing until age 15. Your health care provider may recommend screening at age 75 if you are at increased risk. You will have tests every 1-10 years, depending on your results and the type of screening test.  Diabetes screening. This is done by checking your blood sugar (glucose) after you have not eaten for a while (fasting). You may have this done every 1-3 years.  Mammogram. This may be done every 1-2 years. Talk with your health care provider about how often you should have regular mammograms.  BRCA-related cancer screening. This may be done if you have a family history of breast, ovarian, tubal, or peritoneal cancers.  Other tests  Sexually transmitted disease (STD) testing.  Bone density scan. This is done to screen for osteoporosis. You may have this done starting at age 44. Follow these instructions at home: Eating and drinking  Eat a diet that includes fresh fruits and vegetables, whole grains, lean protein, and low-fat dairy products. Limit  your intake of foods with high amounts of sugar, saturated fats, and salt.  Take vitamin and mineral supplements as recommended by your health care provider.  Do not drink alcohol if your health care provider tells you not to drink.  If you drink alcohol: ? Limit how much you have to 0-1 drink a day. ? Be aware of how much alcohol is in your drink. In the [place] one drink equals one 12 oz bottle of beer (355 mL), one 5 oz glass of wine (148 mL), or one 1 oz glass of hard liquor (44 mL). Lifestyle  Take daily care of your teeth and gums.  Stay active. Exercise for at least 30 minutes on 5 or more days each week.  Do not use any products that contain nicotine or tobacco, such as cigarettes, e-cigarettes, and chewing tobacco. If you need help quitting, ask your health care provider.  If you are sexually active, practice safe sex. Use a condom or other form of protection in order to prevent STIs (sexually transmitted infections).  Talk with your health care provider about taking a low-dose aspirin or statin. What's next?  Go to your health care provider once a year for a well check visit.  Ask your health care provider how often you should have your eyes and teeth checked.  Stay up to date on all vaccines. This information is not intended to replace advice given to you by your health care provider. Make sure you discuss any questions you have with your health care provider. Document Revised: 04/27/2018 Document Reviewed: 04/27/2018 Elsevier Patient Education  2020 [employer].

## 2019-12-06 ENCOUNTER — Ambulatory Visit (INDEPENDENT_AMBULATORY_CARE_PROVIDER_SITE_OTHER): Payer: Medicare Other | Admitting: Nurse Practitioner

## 2019-12-06 ENCOUNTER — Encounter: Payer: Self-pay | Admitting: Nurse Practitioner

## 2019-12-06 ENCOUNTER — Other Ambulatory Visit: Payer: Self-pay

## 2019-12-06 VITALS — BP 118/58 | HR 62 | Temp 97.8°F | Resp 20 | Ht 65.0 in | Wt 111.0 lb

## 2019-12-06 DIAGNOSIS — R21 Rash and other nonspecific skin eruption: Secondary | ICD-10-CM | POA: Diagnosis not present

## 2019-12-06 MED ORDER — DIPHENHYDRAMINE-ZINC ACETATE 2-0.1 % EX CREA: 1.0000 "application " | TOPICAL_CREAM | Freq: Three times a day (TID) | CUTANEOUS | 0 refills | Status: DC | PRN

## 2019-12-06 MED ORDER — PREDNISONE 10 MG (21) PO TBPK: ORAL_TABLET | ORAL | 0 refills | Status: DC

## 2019-12-06 NOTE — Assessment & Plan Note (Signed)
Patient is a 74 year old female who presents to clinic for rash.  Current episode started 1 to 4 weeks ago patient is reporting increased itching.  The problem has been gradually worsening since onset.  Patient has not changed any routine lifestyle.  She reports not changing shampoos, body lotion, or diet.  Patient states she is on tamoxifen and is wondering that that might be the cause of her itching.  Her rash is present bilateral upper extremity, lower bilateral extremity. Provided education to patient with printed handout given. Started patient on prednisone pack for 6 days, Benadryl topical antiitch cream. Patient to follow-up with worsening or unresolved symptoms.

## 2019-12-06 NOTE — Patient Instructions (Addendum)
Kayla Randolph is a 74 year old female who presents to clinic for Kayla.  Current episode started 1 to 4 weeks ago Randolph is reporting increased itching.  The problem has been gradually worsening since onset.  Randolph has not changed any routine lifestyle.  She reports not changing shampoos, body lotion, or diet.  Randolph states she is on tamoxifen and is wondering that that might be the cause of her itching.  Her Kayla is present bilateral upper extremity, lower bilateral extremity. Provided education to Randolph with printed handout given. Started Randolph on prednisone pack for 6 days, Benadryl topical antiitch cream. Randolph to follow-up with worsening or unresolved symptoms.  Kayla, Adult  A Kayla is a change in the color of your skin. A Kayla can also change the way your skin feels. There are many different conditions and factors that can cause a Kayla. Follow these instructions at home: The goal of treatment is to stop the itching and keep the Kayla from spreading. Watch for any changes in your symptoms. Let your doctor know about them. Follow these instructions to help with your condition: Medicine Take or apply over-the-counter and prescription medicines only as told by your doctor. These may include medicines:  To treat red or swollen skin (corticosteroid creams).  To treat itching.  To treat an allergy (oral antihistamines).  To treat very bad symptoms (oral corticosteroids).  Skin care  Put cool cloths (compresses) on the affected areas.  Do not scratch or rub your skin.  Avoid covering the Kayla. Make sure that the Kayla is exposed to air as much as possible. Managing itching and discomfort  Avoid hot showers or baths. These can make itching worse. A cold shower may help.  Try taking a bath with: ? Epsom salts. You can get these at your local pharmacy or grocery store. Follow the instructions on the package. ? Baking soda. Pour a small amount into the bath as told by your  doctor. ? Colloidal oatmeal. You can get this at your local pharmacy or grocery store. Follow the instructions on the package.  Try putting baking soda paste onto your skin. Stir water into baking soda until it gets like a paste.  Try putting on a lotion that relieves itchiness (calamine lotion).  Keep cool and out of the sun. Sweating and being hot can make itching worse. General instructions   Rest as needed.  Drink enough fluid to keep your pee (urine) pale yellow.  Wear loose-fitting clothing.  Avoid scented soaps, detergents, and perfumes. Use gentle soaps, detergents, perfumes, and other cosmetic products.  Avoid anything that causes your Kayla. Keep a journal to help track what causes your Kayla. Write down: ? What you eat. ? What cosmetic products you use. ? What you drink. ? What you wear. This includes jewelry.  Keep all follow-up visits as told by your doctor. This is important. Contact a doctor if:  You sweat at night.  You lose weight.  You pee (urinate) more than normal.  You pee less than normal, or you notice that your pee is a darker color than normal.  You feel weak.  You throw up (vomit).  Your skin or the whites of your eyes look yellow (jaundice).  Your skin: ? Tingles. ? Is numb.  Your Kayla: ? Does not go away after a few days. ? Gets worse.  You are: ? More thirsty than normal. ? More tired than normal.  You have: ? New symptoms. ? Pain in your belly (abdomen). ?  A fever. ? Watery poop (diarrhea). Get help right away if:  You have a fever and your symptoms suddenly get worse.  You start to feel mixed up (confused).  You have a very bad headache or a stiff neck.  You have very bad joint pains or stiffness.  You have jerky movements that you cannot control (seizure).  Your Kayla covers all or most of your body. The Kayla may or may not be painful.  You have blisters that: ? Are on top of the Kayla. ? Grow larger. ? Grow  together. ? Are painful. ? Are inside your nose or mouth.  You have a Kayla that: ? Looks like purple pinprick-sized spots all over your body. ? Has a "bull's eye" or looks like a target. ? Is red and painful, causes your skin to peel, and is not from being in the sun too long. Summary  A Kayla is a change in the color of your skin. A Kayla can also change the way your skin feels.  The goal of treatment is to stop the itching and keep the Kayla from spreading.  Take or apply over-the-counter and prescription medicines only as told by your doctor.  Contact a doctor if you have new symptoms or symptoms that get worse.  Keep all follow-up visits as told by your doctor. This is important. This information is not intended to replace advice given to you by your health care provider. Make sure you discuss any questions you have with your health care provider. Document Revised: 08/25/2018 Document Reviewed: 12/05/2017 Elsevier Randolph Education  [place].

## 2019-12-06 NOTE — Progress Notes (Signed)
Acute Office Visit  Subjective:    Patient ID: Kayla Randolph, female    DOB: 1945-08-01, 74 y.o.   MRN: [id number]  Chief Complaint  Patient presents with  . Rash    lower legs - for several weeks     Rash This is a recurrent problem. The current episode started 1 to 4 weeks ago. The problem has been gradually worsening since onset. The affected locations include the left hand, right hand, right lower leg, right upper leg, left lower leg and left upper leg. The rash is characterized by dryness, redness and itchiness. She was exposed to nothing. Pertinent negatives include no cough or shortness of breath. Treatments tried: topical antihistamine. The treatment provided no relief.     Past Medical History:  Diagnosis Date  . Allergy   . Anxiety   . Breast cancer ([place])   . Cataract   . Depression   . History of colon polyps   . Hyperlipidemia   . Hypothyroidism   . Interstitial cystitis    History of  . Osteopenia   . S/P lumpectomy, left breast 04/03/2019  . Skin cancer    removed from left nostril  . Vitamin D deficiency     Past Surgical History:  Procedure Laterality Date  . BREAST LUMPECTOMY WITH RADIOACTIVE SEED AND SENTINEL LYMPH NODE BIOPSY Left 04/03/2019   Procedure: LEFT BREAST LUMPECTOMY WITH RADIOACTIVE SEED AND SENTINEL LYMPH NODE MAPPING;  Surgeon: Erroll Luna, MD;  Location: [facility];  Service: General;  Laterality: Left;  . CESAREAN SECTION    . EYE SURGERY Right    cataract  . EYE SURGERY Left    cataract  . TONSILLECTOMY      Family History  Problem Relation Age of Onset  . Heart attack Father   . Congestive Heart Failure Father   . Osteoporosis Maternal Aunt   . Uterine cancer Maternal Aunt   . Cancer Mother        possible lung - spread   . Early death Mother   . Stroke Maternal Grandmother   . Breast cancer Paternal Grandmother   . Stroke Paternal Grandfather     Social History   Socioeconomic History  . Marital  status: Married    Spouse name: Ed  . Number of children: 2  . Years of education: 16  . Highest education level: Bachelor's degree (e.g., BA, AB, BS)  Occupational History  . Occupation: Retired    Comment: Geophysicist/field seismologist  Tobacco Use  . Smoking status: Never Smoker  . Smokeless tobacco: Never Used  Vaping Use  . Vaping Use: Never used  Substance and Sexual Activity  . Alcohol use: Yes    Alcohol/week: 7.0 standard drinks    Types: 7 Standard drinks or equivalent per week    Comment: wine nightly  . Drug use: No  . Sexual activity: Yes    Birth control/protection: Post-menopausal  Other Topics Concern  . Not on file  Social History Narrative   Lives with Ed   2 cats    2 children = neither local    Social Determinants of Health   Financial Resource Strain: Low Risk   . Difficulty of Paying Living Expenses: Not hard at all  Food Insecurity: No Food Insecurity  . Worried About Charity fundraiser in the Last Year: Never true  . Ran Out of Food in the Last Year: Never true  Transportation Needs: No Transportation Needs  .  Lack of Transportation (Medical): No  . Lack of Transportation (Non-Medical): No  Physical Activity: Sufficiently Active  . Days of Exercise per Week: 5 days  . Minutes of Exercise per Session: 30 min  Stress: No Stress Concern Present  . Feeling of Stress : Only a little  Social Connections: Socially Integrated  . Frequency of Communication with Friends and Family: More than three times a week  . Frequency of Social Gatherings with Friends and Family: More than three times a week  . Attends Religious Services: More than 4 times per year  . Active Member of Clubs or Organizations: Yes  . Attends Archivist Meetings: More than 4 times per year  . Marital Status: Married  Human resources officer Violence: Not At Risk  . Fear of Current or Ex-Partner: No  . Emotionally Abused: No  . Physically Abused: No  . Sexually Abused: No     Outpatient Medications Prior to Visit  Medication Sig Dispense Refill  . aspirin EC 81 MG tablet Take 81 mg by mouth daily. Swallow whole.    . Black Pepper-Turmeric (TURMERIC COMPLEX/BLACK PEPPER PO) Take by mouth.    . Cholecalciferol (VITAMIN D3) 1000 UNITS CAPS Take 2 capsules by mouth daily.     . cycloSPORINE (RESTASIS) 0.05 % ophthalmic emulsion Place 1 drop into both eyes 2 (two) times daily.    Marland Kitchen escitalopram (LEXAPRO) 10 MG tablet TAKE (1) TABLET DAILY AS DIRECTED. 90 tablet 3  . estradiol (ESTRACE) 0.1 MG/GM vaginal cream INSERT 1 VAGINALLY AT BEDTIME 127.5 g 1  . fluticasone (FLONASE) 50 MCG/ACT nasal spray 1 SPRAY IN EACH NOSTRIL EVERY 12 HOURS 48 g 1  . levothyroxine (SYNTHROID) 25 MCG tablet TAKE 1 TABLET EVERY DAY BEFORE BREAKFAST 90 tablet 3  . Multiple Vitamins-Minerals (WOMENS 50+ MULTI VITAMIN/MIN PO) Take by mouth.    . Omega-3 Fatty Acids (SUPER OMEGA-3 PO) Take by mouth.    . Polyethylene Glycol 3350 (MIRALAX PO) Take 1 packet by mouth daily as needed.      . rosuvastatin (CRESTOR) 5 MG tablet TAKE (1) TABLET DAILY AS DIRECTED. 90 tablet 3  . Specialty Vitamins Products (ONE-A-DAY BONE STRENGTH) 500-28-100 MG-MG-UNIT TABS Take 1 tablet by mouth 3 (three) times daily.    . tamoxifen (NOLVADEX) 20 MG tablet Take 20 mg by mouth daily.    Marland Kitchen UNABLE TO FIND Med Name: Prelief Bladder     No facility-administered medications prior to visit.    Allergies  Allergen Reactions  . Macrodantin   . Sulfa Antibiotics   . Wellbutrin [Bupropion Hcl]     Review of Systems  Constitutional: Negative.   HENT: Negative.   Eyes: Negative.   Respiratory: Negative for cough and shortness of breath.   Cardiovascular: Negative.   Gastrointestinal: Negative.   Genitourinary: Negative.   Musculoskeletal: Negative.   Skin: Positive for color change and rash.  Neurological: Negative.        Objective:    Physical Exam Constitutional:      Appearance: Normal appearance.   HENT:     Head: Normocephalic.  Eyes:     Conjunctiva/sclera: Conjunctivae normal.  Cardiovascular:     Rate and Rhythm: Normal rate and regular rhythm.     Pulses: Normal pulses.     Heart sounds: Normal heart sounds.  Pulmonary:     Effort: Pulmonary effort is normal.     Breath sounds: Normal breath sounds.  Abdominal:     General: Bowel sounds are normal.  Musculoskeletal:        General: Normal range of motion.  Skin:    Findings: Erythema and rash present.  Neurological:     Mental Status: She is alert and oriented to person, place, and time.  Psychiatric:        Mood and Affect: Mood normal.     BP (!) 118/58   Pulse 62   Temp 97.8 F (36.6 C)   Resp 20   Ht 5\' 5"  (1.651 m)   Wt 111 lb (50.3 kg)   SpO2 96%   BMI 18.47 kg/m  Wt Readings from Last 3 Encounters:  12/06/19 111 lb (50.3 kg)  10/02/19 113 lb 6.4 oz (51.4 kg)  09/07/19 111 lb 9.6 oz (50.6 kg)    Health Maintenance Due  Topic Date Due  . COLON CANCER SCREENING ANNUAL FOBT  03/02/2019    There are no preventive care reminders to display for this patient.   Lab Results  Component Value Date   TSH 3.660 08/27/2019   Lab Results  Component Value Date   WBC 5.5 10/02/2019   HGB 14.3 10/02/2019   HCT 44.8 10/02/2019   MCV 97.4 10/02/2019   PLT 155 10/02/2019   Lab Results  Component Value Date   NA 142 10/02/2019   K 4.4 10/02/2019   CO2 30 10/02/2019   GLUCOSE 84 10/02/2019   BUN 16 10/02/2019   CREATININE 0.72 10/02/2019   BILITOT 0.4 10/02/2019   ALKPHOS 34 (L) 10/02/2019   AST 23 10/02/2019   ALT 15 10/02/2019   PROT 6.5 10/02/2019   ALBUMIN 3.6 10/02/2019   CALCIUM 8.9 10/02/2019   ANIONGAP 8 10/02/2019   Lab Results  Component Value Date   CHOL 166 11/01/2018   Lab Results  Component Value Date   HDL 83 11/01/2018   Lab Results  Component Value Date   LDLCALC 69 11/01/2018   Lab Results  Component Value Date   TRIG 69 11/01/2018   Lab Results  Component  Value Date   CHOLHDL 2.0 11/01/2018   No results found for: HGBA1C     Assessment & Plan:   Problem List Items Addressed This Visit      Musculoskeletal and Integument   Rash - Primary    Patient is a 74 year old female who presents to clinic for rash.  Current episode started 1 to 4 weeks ago patient is reporting increased itching.  The problem has been gradually worsening since onset.  Patient has not changed any routine lifestyle.  She reports not changing shampoos, body lotion, or diet.  Patient states she is on tamoxifen and is wondering that that might be the cause of her itching.  Her rash is present bilateral upper extremity, lower bilateral extremity. Provided education to patient with printed handout given. Started patient on prednisone pack for 6 days, Benadryl topical antiitch cream. Patient to follow-up with worsening or unresolved symptoms.      Relevant Medications   diphenhydrAMINE-zinc acetate (BENADRYL EXTRA STRENGTH) cream   predniSONE (STERAPRED UNI-PAK 21 TAB) 10 MG (21) TBPK tablet       Meds ordered this encounter  Medications  . diphenhydrAMINE-zinc acetate (BENADRYL EXTRA STRENGTH) cream    Sig: Apply 1 application topically 3 (three) times daily as needed for itching.    Dispense:  28.4 g    Refill:  0    Order Specific Question:   Supervising Provider    Answer:   Caryl Pina A [id number]  . predniSONE (  STERAPRED UNI-PAK 21 TAB) 10 MG (21) TBPK tablet    Sig: 6 tab by mouth day 1, 5 tab day 2, 4 tab day 3, 3 tab day 4, 2 tab day 5, 1 tab day 6    Dispense:  1 each    Refill:  0    Order Specific Question:   Supervising Provider    Answer:   Caryl Pina A [[id number]]     Ivy Lynn, NP

## 2019-12-10 ENCOUNTER — Encounter (INDEPENDENT_AMBULATORY_CARE_PROVIDER_SITE_OTHER): Payer: Self-pay

## 2019-12-17 ENCOUNTER — Encounter (INDEPENDENT_AMBULATORY_CARE_PROVIDER_SITE_OTHER): Payer: Self-pay

## 2019-12-24 ENCOUNTER — Encounter (INDEPENDENT_AMBULATORY_CARE_PROVIDER_SITE_OTHER): Payer: Self-pay

## 2019-12-29 ENCOUNTER — Encounter (INDEPENDENT_AMBULATORY_CARE_PROVIDER_SITE_OTHER): Payer: Self-pay

## 2019-12-31 ENCOUNTER — Encounter (INDEPENDENT_AMBULATORY_CARE_PROVIDER_SITE_OTHER): Payer: Self-pay

## 2020-01-07 ENCOUNTER — Encounter (INDEPENDENT_AMBULATORY_CARE_PROVIDER_SITE_OTHER): Payer: Self-pay

## 2020-01-08 ENCOUNTER — Ambulatory Visit (INDEPENDENT_AMBULATORY_CARE_PROVIDER_SITE_OTHER): Payer: Medicare Other | Admitting: Family Medicine

## 2020-01-08 ENCOUNTER — Other Ambulatory Visit: Payer: Self-pay

## 2020-01-08 ENCOUNTER — Encounter: Payer: Self-pay | Admitting: Family Medicine

## 2020-01-08 VITALS — BP 119/69 | HR 69 | Temp 98.0°F | Ht 65.0 in | Wt 113.0 lb

## 2020-01-08 DIAGNOSIS — E034 Atrophy of thyroid (acquired): Secondary | ICD-10-CM

## 2020-01-08 DIAGNOSIS — L821 Other seborrheic keratosis: Secondary | ICD-10-CM | POA: Diagnosis not present

## 2020-01-08 DIAGNOSIS — R0982 Postnasal drip: Secondary | ICD-10-CM

## 2020-01-08 MED ORDER — AZELASTINE HCL 0.1 % NA SOLN: 1.0000 | Freq: Every day | NASAL | 12 refills | Status: DC

## 2020-01-08 NOTE — Patient Instructions (Signed)
Seborrheic Keratosis °A seborrheic keratosis is a common, noncancerous (benign) skin growth. These growths are velvety, waxy, rough, tan, brown, or black spots that appear on the skin. These skin growths can be flat or raised, and scaly. °What are the causes? °The cause of this condition is not known. °What increases the risk? °You are more likely to develop this condition if you: °· Have a family history of seborrheic keratosis. °· Are 50 or older. °· Are pregnant. °· Have had estrogen replacement therapy. °What are the signs or symptoms? °Symptoms of this condition include growths on the face, chest, shoulders, back, or other areas. These growths: °· Are usually painless, but may become irritated and itchy. °· Can be yellow, brown, black, or other colors. °· Are slightly raised or have a flat surface. °· Are sometimes rough or wart-like in texture. °· Are often velvety or waxy on the surface. °· Are round or oval-shaped. °· Often occur in groups, but may occur as a single growth. °How is this diagnosed? °This condition is diagnosed with a medical history and physical exam. °· A sample of the growth may be tested (skin biopsy). °· You may need to see a skin specialist (dermatologist). °How is this treated? °Treatment is not usually needed for this condition, unless the growths are irritated or bleed often. °· You may also choose to have the growths removed if you do not like their appearance. °? Most commonly, these growths are treated with a procedure in which liquid nitrogen is applied to "freeze" off the growth (cryosurgery). °? They may also be burned off with electricity (electrocautery) or removed by scraping (curettage). °Follow these instructions at home: °· Watch your growth for any changes. °· Keep all follow-up visits as told by your health care provider. This is important. °· Do not scratch or pick at the growth or growths. This can cause them to become irritated or infected. °Contact a health care  provider if: °· You suddenly have many new growths. °· Your growth bleeds, itches, or hurts. °· Your growth suddenly becomes larger or changes color. °Summary °· A seborrheic keratosis is a common, noncancerous (benign) skin growth. °· Treatment is not usually needed for this condition, unless the growths are irritated or bleed often. °· Watch your growth for any changes. °· Contact a health care provider if you suddenly have many new growths or your growth suddenly becomes larger or changes color. °· Keep all follow-up visits as told by your health care provider. This is important. °This information is not intended to replace advice given to you by your health care provider. Make sure you discuss any questions you have with your health care provider. °Document Revised: 09/15/2017 Document Reviewed: 09/15/2017 °Elsevier Patient Education © 2020 Elsevier Inc. ° °

## 2020-01-08 NOTE — Progress Notes (Signed)
Subjective: CC: f/u hypothyroidism PCP: Kayla Randolph Norlander, DO [facility]:UXLKG [name] is a 74 y.o. female presenting to clinic today for:  1.  Hypothyroidism Incidentally found on routine laboratory work-up.    She is compliant with Synthroid 25 mcg daily.  No heart change in voice, heart palpitations, tremor, change in weight or bowel habit   2.  Skin lesion Patient reports he has a skin lesion of the right axilla.  Denies any change in growth but thought that the color looked kind of funny.  She denies any spontaneous bleeding, irritation.  3.  Postnasal drip Patient struggles with postnasal drip every night.  She notes that this is most irritating when she is trying to sing.  She takes Flonase daily and this does help relieve congestion but does not help much at all with the drainage.  ROS: Per HPI  Allergies  Allergen Reactions  . Macrodantin   . Sulfa Antibiotics   . Wellbutrin [Bupropion Hcl]    Past Medical History:  Diagnosis Date  . Allergy   . Anxiety   . Breast cancer ([place])   . Cataract   . Depression   . History of colon polyps   . Hyperlipidemia   . Hypothyroidism   . Interstitial cystitis    History of  . Osteopenia   . Kayla Randolph/P lumpectomy, left breast 04/03/2019  . Skin cancer    removed from left nostril  . Vitamin D deficiency     Current Outpatient Medications:  .  aspirin EC 81 MG tablet, Take 81 mg by mouth daily. Swallow whole., Disp: , Rfl:  .  Black Pepper-Turmeric (TURMERIC COMPLEX/BLACK PEPPER PO), Take by mouth., Disp: , Rfl:  .  Cholecalciferol (VITAMIN D3) 1000 UNITS CAPS, Take 2 capsules by mouth daily. , Disp: , Rfl:  .  cycloSPORINE (RESTASIS) 0.05 % ophthalmic emulsion, Place 1 drop into both eyes 2 (two) times daily., Disp: , Rfl:  .  diphenhydrAMINE-zinc acetate (BENADRYL EXTRA STRENGTH) cream, Apply 1 application topically 3 (three) times daily as needed for itching., Disp: 28.4 g, Rfl: 0 .  escitalopram (LEXAPRO) 10 MG tablet, TAKE (1)  TABLET DAILY AS DIRECTED., Disp: 90 tablet, Rfl: 3 .  estradiol (ESTRACE) 0.1 MG/GM vaginal cream, INSERT 1 VAGINALLY AT BEDTIME, Disp: 127.5 g, Rfl: 1 .  fluticasone (FLONASE) 50 MCG/ACT nasal spray, 1 SPRAY IN EACH NOSTRIL EVERY 12 HOURS, Disp: 48 g, Rfl: 1 .  levothyroxine (SYNTHROID) 25 MCG tablet, TAKE 1 TABLET EVERY DAY BEFORE BREAKFAST, Disp: 90 tablet, Rfl: 3 .  Multiple Vitamins-Minerals (WOMENS 50+ MULTI VITAMIN/MIN PO), Take by mouth., Disp: , Rfl:  .  Omega-3 Fatty Acids (SUPER OMEGA-3 PO), Take by mouth., Disp: , Rfl:  .  Polyethylene Glycol 3350 (MIRALAX PO), Take 1 packet by mouth daily as needed.  , Disp: , Rfl:  .  predniSONE (STERAPRED UNI-PAK 21 TAB) 10 MG (21) TBPK tablet, 6 tab by mouth day 1, 5 tab day 2, 4 tab day 3, 3 tab day 4, 2 tab day 5, 1 tab day 6, Disp: 1 each, Rfl: 0 .  rosuvastatin (CRESTOR) 5 MG tablet, TAKE (1) TABLET DAILY AS DIRECTED., Disp: 90 tablet, Rfl: 3 .  Specialty Vitamins Products (ONE-A-DAY BONE STRENGTH) 500-28-100 MG-MG-UNIT TABS, Take 1 tablet by mouth 3 (three) times daily., Disp: , Rfl:  .  tamoxifen (NOLVADEX) 20 MG tablet, Take 20 mg by mouth daily., Disp: , Rfl:  .  UNABLE TO FIND, Med Name: Prelief Bladder, Disp: ,  Rfl:  Social History   Socioeconomic History  . Marital status: Married    Spouse name: Ed  . Number of children: 2  . Years of education: 16  . Highest education level: Bachelor'Kayla Randolph degree (e.g., BA, AB, BS)  Occupational History  . Occupation: Retired    Comment: Geophysicist/field seismologist  Tobacco Use  . Smoking status: Never Smoker  . Smokeless tobacco: Never Used  Vaping Use  . Vaping Use: Never used  Substance and Sexual Activity  . Alcohol use: Yes    Alcohol/week: 7.0 standard drinks    Types: 7 Standard drinks or equivalent per week    Comment: wine nightly  . Drug use: No  . Sexual activity: Yes    Birth control/protection: Post-menopausal  Other Topics Concern  . Not on file  Social History Narrative   Lives  with Ed   2 cats    2 children = neither local    Social Determinants of Health   Financial Resource Strain: Low Risk   . Difficulty of Paying Living Expenses: Not hard at all  Food Insecurity: No Food Insecurity  . Worried About Charity fundraiser in the Last Year: Never true  . Ran Out of Food in the Last Year: Never true  Transportation Needs: No Transportation Needs  . Lack of Transportation (Medical): No  . Lack of Transportation (Non-Medical): No  Physical Activity: Sufficiently Active  . Days of Exercise per Week: 5 days  . Minutes of Exercise per Session: 30 min  Stress: No Stress Concern Present  . Feeling of Stress : Only a little  Social Connections: Socially Integrated  . Frequency of Communication with Friends and Family: More than three times a week  . Frequency of Social Gatherings with Friends and Family: More than three times a week  . Attends Religious Services: More than 4 times per year  . Active Member of Clubs or Organizations: Yes  . Attends Archivist Meetings: More than 4 times per year  . Marital Status: Married  Human resources officer Violence: Not At Risk  . Fear of Current or Ex-Partner: No  . Emotionally Abused: No  . Physically Abused: No  . Sexually Abused: No   Objective: Office vital signs reviewed. BP 119/69   Pulse 69   Temp 98 F (36.7 C) (Temporal)   Ht 5\' 5"  (1.651 m)   Wt 113 lb (51.3 kg)   SpO2 97%   BMI 18.80 kg/m   Physical Examination:  General: Awake, alert, well appearing, No acute distress HEENT: Normal; no exophthalmos.  No goiter; no lymphadenopathy  Cardio: regular rate and rhythm, S1S2 heard, no murmurs appreciated Pulm: clear to auscultation bilaterally, no wheezes, rhonchi or rales; normal work of breathing on room air Extremities: warm, well perfused, No edema, cyanosis or clubbing; +2 pulses bilaterally MSK: Normal gait and station  Skin: dry; intact; no rashes or lesions; normal temperature; lesion of  concern is consistent with a seborrheic keratosis underneath the right axilla Neuro: no tremor  Assessment/ Plan: 74 y.o. female   1. Hypothyroidism due to acquired atrophy of thyroid Check thyroid panel - Thyroid Panel With TSH  2. Seborrheic keratoses I discussed that this was a benign lesion.  Handout provided  3. Post-nasal drainage Trial of Astelin at bedtime.  If persistent, could consider adding oral antihistamine. - azelastine (ASTELIN) 0.1 % nasal spray; Place 1-2 sprays into both nostrils at bedtime.  Dispense: 30 mL; Refill: 12    No  orders of the defined types were placed in this encounter.  No orders of the defined types were placed in this encounter.    Kayla Randolph Norlander, DO [place] [phone]

## 2020-01-09 LAB — THYROID PANEL WITH TSH
Free Thyroxine Index: 1.5 (ref 1.2–4.9)
T3 Uptake Ratio: 25 % (ref 24–39)
T4, Total: 5.8 ug/dL (ref 4.5–12.0)
TSH: 1.86 u[IU]/mL (ref 0.450–4.500)

## 2020-01-14 ENCOUNTER — Encounter (INDEPENDENT_AMBULATORY_CARE_PROVIDER_SITE_OTHER): Payer: Self-pay

## 2020-01-21 ENCOUNTER — Encounter (INDEPENDENT_AMBULATORY_CARE_PROVIDER_SITE_OTHER): Payer: Self-pay

## 2020-01-28 ENCOUNTER — Encounter (INDEPENDENT_AMBULATORY_CARE_PROVIDER_SITE_OTHER): Payer: Self-pay

## 2020-02-04 ENCOUNTER — Encounter (INDEPENDENT_AMBULATORY_CARE_PROVIDER_SITE_OTHER): Payer: Self-pay

## 2020-02-11 ENCOUNTER — Encounter (INDEPENDENT_AMBULATORY_CARE_PROVIDER_SITE_OTHER): Payer: Self-pay

## 2020-02-18 ENCOUNTER — Encounter (INDEPENDENT_AMBULATORY_CARE_PROVIDER_SITE_OTHER): Payer: Self-pay

## 2020-02-25 ENCOUNTER — Encounter (INDEPENDENT_AMBULATORY_CARE_PROVIDER_SITE_OTHER): Payer: Self-pay

## 2020-03-03 ENCOUNTER — Encounter (INDEPENDENT_AMBULATORY_CARE_PROVIDER_SITE_OTHER): Payer: Self-pay

## 2020-03-04 ENCOUNTER — Other Ambulatory Visit: Payer: Self-pay

## 2020-03-04 ENCOUNTER — Ambulatory Visit (INDEPENDENT_AMBULATORY_CARE_PROVIDER_SITE_OTHER): Payer: Medicare Other

## 2020-03-04 DIAGNOSIS — Z23 Encounter for immunization: Secondary | ICD-10-CM

## 2020-03-05 ENCOUNTER — Other Ambulatory Visit: Payer: Self-pay | Admitting: Family Medicine

## 2020-03-05 ENCOUNTER — Encounter: Payer: Self-pay | Admitting: Dermatology

## 2020-03-05 ENCOUNTER — Ambulatory Visit (INDEPENDENT_AMBULATORY_CARE_PROVIDER_SITE_OTHER): Payer: Medicare Other | Admitting: Dermatology

## 2020-03-05 DIAGNOSIS — Z85828 Personal history of other malignant neoplasm of skin: Secondary | ICD-10-CM

## 2020-03-05 DIAGNOSIS — L821 Other seborrheic keratosis: Secondary | ICD-10-CM | POA: Diagnosis not present

## 2020-03-05 DIAGNOSIS — Z1283 Encounter for screening for malignant neoplasm of skin: Secondary | ICD-10-CM

## 2020-03-10 ENCOUNTER — Encounter (INDEPENDENT_AMBULATORY_CARE_PROVIDER_SITE_OTHER): Payer: Self-pay

## 2020-03-17 ENCOUNTER — Encounter (INDEPENDENT_AMBULATORY_CARE_PROVIDER_SITE_OTHER): Payer: Self-pay

## 2020-03-17 NOTE — Progress Notes (Signed)
[facility]  [phone] [phone]     ID: KADYNN SCHANTZ DOB: April 23, 1946  MR#: [id number]  BMW#:[id number]  Patient Care Team: [name], DO as PCP - General (Family Medicine) Jethro Bolus, MD as Consulting Physician (Ophthalmology) Vida Rigger, MD as Consulting Physician (Gastroenterology) Janalyn Harder, MD as Consulting Physician (Dermatology) Pershing Proud, RN as Oncology Nurse Navigator Donnelly Angelica, RN as Oncology Nurse Navigator Maddoxx Burkitt, Valentino Hue, MD as Consulting Physician (Oncology) Jethro Bolus, MD as Consulting Physician (Ophthalmology) Dorothy Puffer, MD as Consulting Physician (Radiation Oncology) Harriette Bouillon, MD as Consulting Physician (General Surgery) Lowella Dell, MD OTHER MD:  CHIEF COMPLAINT: estrogen receptor positive breast cancer  CURRENT TREATMENT: Tamoxifen   INTERVAL HISTORY: Raynee returns today for follow up of her estrogen receptor positive breast cancer.   She started tamoxifen on 07/02/2019.  She is tolerating this remarkably well.  She has no hot flashes and no vaginal wetness.  She has other symptoms which she thinks might be related although they probably are not.  These include a little bit of itching around the ankles and a little bit of swelling around the ankles which improves with exercise.  She was referred to Dr. Ewing Schlein for a positive Cologuard test. She proceeded to colonoscopy on 11/29/2019. Pathology from the procedure ([id number]) showed a hyperplastic polyp with features of mucosal prolapse.  She is due for her first annual mammography since her diagnosis.   REVIEW OF SYSTEMS: [name] is not doing stretching as she was taught.  She gets a little tightness in the left lateral aspect of the breast as a result.  She exercises by walking but she "has not been doing it as much as she should".  Aside from these issues a detailed review of systems today was stable.   COVID 19  VACCINATION STATUS: That is Retail banker vaccine x2 with booster planned 03/19/2020   HISTORY OF CURRENT ILLNESS:    From the original intake note:   "[name]" had routine screening mammography on 03/02/2019 showing a possible abnormality in the left breast. She underwent left diagnostic mammography with tomography and left breast ultrasonography at [facility] on 03/09/2019 showing: breast density category C; suspicious 1.1 cm left breast mass at 3:30 with associated punctate calcifications; no left axillary adenopathy.  Accordingly on 03/13/2019 she proceeded to biopsy of the left breast area in question. The pathology from this procedure ([id number]) showed: invasive ductal carcinoma, grade 2. Prognostic indicators significant for: estrogen receptor, 100% positive and progesterone receptor, 100% positive, both with strong staining intensity. Proliferation marker Ki67 at 30%. HER2 negative by immunohistochemistry (1+).  The patient's subsequent history is as detailed below.   PAST MEDICAL HISTORY: Past Medical History:  Diagnosis Date  . Allergy   . Anxiety   . Atypical mole 11/23/2002   moderate-right lower back(WS)  . Basal cell carcinoma 02/06/2019   nod-left nare (MOHS)  . Breast cancer (HCC)   . Cataract   . Depression   . History of colon polyps   . Hyperlipidemia   . Hypothyroidism   . Interstitial cystitis    History of  . Osteopenia   . S/P lumpectomy, left breast 04/03/2019  . Skin cancer    removed from left nostril  . Vitamin D deficiency     PAST SURGICAL HISTORY: Past Surgical History:  Procedure Laterality Date  . BREAST LUMPECTOMY WITH RADIOACTIVE SEED AND SENTINEL LYMPH NODE BIOPSY Left 04/03/2019   Procedure: LEFT BREAST LUMPECTOMY  WITH RADIOACTIVE SEED AND SENTINEL LYMPH NODE MAPPING;  Surgeon: Harriette Bouillon, MD;  Location: [facility];  Service: General;  Laterality: Left;  . CESAREAN SECTION    . EYE SURGERY Right    cataract  .  EYE SURGERY Left    cataract  . TONSILLECTOMY      FAMILY HISTORY: Family History  Problem Relation Age of Onset  . Heart attack Father   . Congestive Heart Failure Father   . Osteoporosis Maternal Aunt   . Uterine cancer Maternal Aunt   . Cancer Mother        possible lung - spread   . Early death Mother   . Stroke Maternal Grandmother   . Breast cancer Paternal Grandmother   . Stroke Paternal Grandfather    Patient's father was 2 years old when he died from congestive heart failure. Patient's mother died from cancer, type unknown to the patient, at age 2. The patient denies a family hx of ovarian cancer. She reports a maternal aunt was diagnosed with uterine cancer (age unsure), and her paternal grandmother with breast cancer at a very young age (exact age unsure).  The patient has no siblings.   GYNECOLOGIC HISTORY:  No LMP recorded. Patient is postmenopausal. Menarche: 31.74 years old Age at first live birth: 74 years old GX P 2 LMP age 48-51 Contraceptive: yes, used pills on and off from age [age] without issue. HRT: yes, for about 5 years, until age 88 or 74.  Hysterectomy? no BSO? 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  Husband Ed is retired Solicitor in the [employer].. She lives at home with Ed and 2 cats. Daughter [name], age 77, lives in [place] as a homemaker. Son Canadian, age 46, lives in [place], [place] as a IT trainer.  The patient has 4 grandchildren one of them attending [employer] [facility] [employer].  She attends [employer] in [place]    ADVANCED DIRECTIVES: In the absence of any documentation to the contrary, the patient's spouse is their HCPOA.   HEALTH MAINTENANCE: Social History   Tobacco Use  . Smoking status: Never Smoker  . Smokeless tobacco: Never Used  Vaping Use  . Vaping Use: Never used  Substance Use Topics  . Alcohol use: Yes    Alcohol/week: 7.0 standard  drinks    Types: 7 Standard drinks or equivalent per week    Comment: wine nightly  . Drug use: No     Colonoscopy: 09/2011  PAP: 2019  Bone density: 06/2018, -2.0   Allergies  Allergen Reactions  . Macrodantin   . Sulfa Antibiotics   . Wellbutrin [Bupropion Hcl]     Current Outpatient Medications  Medication Sig Dispense Refill  . aspirin EC 81 MG tablet Take 81 mg by mouth daily. Swallow whole.    Marland Kitchen azelastine (ASTELIN) 0.1 % nasal spray Place 1-2 sprays into both nostrils at bedtime. 30 mL 12  . Black Pepper-Turmeric (TURMERIC COMPLEX/BLACK PEPPER PO) Take by mouth.    . Cholecalciferol (VITAMIN D3) 1000 UNITS CAPS Take 2 capsules by mouth daily.     . cycloSPORINE (RESTASIS) 0.05 % ophthalmic emulsion Place 1 drop into both eyes 2 (two) times daily.    . diphenhydrAMINE-zinc acetate (BENADRYL EXTRA STRENGTH) cream Apply 1 application topically 3 (three) times daily as needed for itching. 28.4 g 0  . escitalopram (LEXAPRO) 10 MG tablet TAKE (1) TABLET DAILY AS  DIRECTED. 90 tablet 3  . estradiol (ESTRACE) 0.1 MG/GM vaginal cream INSERT 1 VAGINALLY AT BEDTIME 127.5 g 1  . fluticasone (FLONASE) 50 MCG/ACT nasal spray 1 SPRAY IN EACH NOSTRIL EVERY 12 HOURS 48 g 1  . levothyroxine (SYNTHROID) 25 MCG tablet TAKE 1 TABLET EVERY DAY BEFORE BREAKFAST 90 tablet 3  . Multiple Vitamins-Minerals (WOMENS 50+ MULTI VITAMIN/MIN PO) Take by mouth.    . Omega-3 Fatty Acids (SUPER OMEGA-3 PO) Take by mouth.    . Polyethylene Glycol 3350 (MIRALAX PO) Take 1 packet by mouth daily as needed.      . predniSONE (DELTASONE) 10 MG tablet Take by mouth.    . rosuvastatin (CRESTOR) 5 MG tablet TAKE (1) TABLET DAILY AS DIRECTED. 90 tablet 3  . Specialty Vitamins Products (ONE-A-DAY BONE STRENGTH) 500-28-100 MG-MG-UNIT TABS Take 1 tablet by mouth 3 (three) times daily.    . tamoxifen (NOLVADEX) 20 MG tablet Take 20 mg by mouth daily.    Marland Kitchen UNABLE TO FIND Med Name: Prelief Bladder     No current  facility-administered medications for this visit.    OBJECTIVE: white woman who appears younger than stated age  Vitals:   03/18/20 1153  BP: (!) 153/75  Pulse: (!) 58  Resp: 17  Temp: 98 F (36.7 C)  SpO2: 97%     Body mass index is 19.24 kg/m.   Wt Readings from Last 3 Encounters:  03/18/20 115 lb 9.6 oz (52.4 kg)  01/08/20 113 lb (51.3 kg)  12/06/19 111 lb (50.3 kg)      ECOG FS:1 - Symptomatic but completely ambulatory  Sclerae unicteric, EOMs intact Wearing a mask No cervical or supraclavicular adenopathy Lungs no rales or rhonchi Heart regular rate and rhythm Abd soft, nontender, positive bowel sounds MSK no focal spinal tenderness, no upper extremity lymphedema Neuro: nonfocal, well oriented, appropriate affect Breasts: The right breast is benign.  The left breast is status post lumpectomy and radiation.  There is no evidence of local recurrence.  Both axillae are benign.   LAB RESULTS:  CMP     Component Value Date/Time   NA 141 03/18/2020 1119   NA 143 11/01/2018 0848   K 3.9 03/18/2020 1119   CL 105 03/18/2020 1119   CO2 32 03/18/2020 1119   GLUCOSE 89 03/18/2020 1119   BUN 19 03/18/2020 1119   BUN 15 11/01/2018 0848   CREATININE 0.71 03/18/2020 1119   CREATININE 0.76 06/21/2019 1204   CREATININE 0.69 07/24/2013 0836   CALCIUM 9.0 03/18/2020 1119   PROT 6.2 (L) 03/18/2020 1119   PROT 6.6 11/01/2018 0848   ALBUMIN 3.6 03/18/2020 1119   ALBUMIN 4.5 11/01/2018 0848   AST 21 03/18/2020 1119   AST 23 06/21/2019 1204   ALT 15 03/18/2020 1119   ALT 18 06/21/2019 1204   ALKPHOS 31 (L) 03/18/2020 1119   BILITOT 0.4 03/18/2020 1119   BILITOT 0.4 06/21/2019 1204   GFRNONAA >60 03/18/2020 1119   GFRNONAA >60 06/21/2019 1204   GFRNONAA 89 07/24/2013 0836   GFRAA >60 10/02/2019 1122   GFRAA >60 06/21/2019 1204   GFRAA >89 07/24/2013 0836    No results found for: TOTALPROTELP, ALBUMINELP, A1GS, A2GS, BETS, BETA2SER, GAMS, MSPIKE, SPEI  No results  found for: KPAFRELGTCHN, LAMBDASER, KAPLAMBRATIO  Lab Results  Component Value Date   WBC 5.4 03/18/2020   NEUTROABS 3.5 03/18/2020   HGB 13.7 03/18/2020   HCT 42.4 03/18/2020   MCV 96.8 03/18/2020   PLT 141 (L)  03/18/2020   No results found for: LABCA2  No components found for: ZOXWRU045  No results for input(s): INR in the last 168 hours.  No results found for: LABCA2  No results found for: WUJ811  No results found for: BJY782  No results found for: NFA213  No results found for: CA2729  No components found for: HGQUANT  No results found for: CEA1 / No results found for: CEA1   No results found for: AFPTUMOR  No results found for: CHROMOGRNA  No results found for: HGBA, HGBA2QUANT, HGBFQUANT, HGBSQUAN (Hemoglobinopathy evaluation)   No results found for: LDH  No results found for: IRON, TIBC, IRONPCTSAT (Iron and TIBC)  Lab Results  Component Value Date   FERRITIN 23 09/19/2012    Urinalysis    Component Value Date/Time   APPEARANCEUR Clear 08/27/2019 0947   GLUCOSEU Negative 08/27/2019 0947   BILIRUBINUR Negative 08/27/2019 0947   PROTEINUR Negative 08/27/2019 0947   UROBILINOGEN negative 12/02/2014 1128   NITRITE Negative 08/27/2019 0947   LEUKOCYTESUR Negative 08/27/2019 0947    STUDIES: No results found.    ELIGIBLE FOR AVAILABLE RESEARCH PROTOCOL: Speaker phone study  ASSESSMENT: 74 y.o. [place], [place] woman status post left breast upper inner quadrant biopsy 03/13/2019 for a clinical T1c N0, stage IA invasive ductal carcinoma, grade 2, estrogen and progesterone receptor positive, HER-2 not amplified, with an MIB-1 of 30%  (1) status post left lumpectomy 04/03/2019 for a pT1c pN0, stage IB invasive ductal carcinoma, grade 2, with negative margins  (a) a total of 2 left axillary lymph nodes were clear  (2) Oncotype score of 21 predicts a risk of recurrence outside the breast within the next 9 years of 7% if the patient's only systemic therapy  is antiestrogens for 5 years.  It also predicts no benefit from chemotherapy.  (3) adjuvant radiation 05/14/2019 through 06/11/2019  (4) started tamoxifen 07/02/2019  (a) bone density 06/22/2019 shows a T score of -2.0  (b) on Estrace vaginal suppositories   PLAN:  Araminta is now just about a year out from definitive surgery for breast cancer with no evidence of disease recurrence.  This is favorable.  She is tolerating tamoxifen well.  I do not think the ankle swelling or itching is going to be related.  Since it improves with exercise I suggested she go walking more frequently, perhaps 5 times a week.  She tells me they do have an area where they enjoy walking right near her house.  I am not sure why her total protein is down.  Usually this is due to inflammation.  She has no symptoms of inflammation however.  This will require further follow-up.  Otherwise she will have mammography later this month and she will see me again in a year  She knows to call for any other issue that may develop before the next visit  Total encounter time 25 minutes.Lowella Dell, MD   03/18/2020 12:07 PM Medical Oncology and Hematology [facility] [address] [place], [place] [address] Tel. [phone]    Fax. [phone]   This document serves as a record of services personally performed by Ruthann Cancer, MD. It was created on his behalf by Mickie Bail, a trained medical scribe. The creation of this record is based on the scribe's personal observations and the provider's statements to them.   I, Ruthann Cancer MD, have reviewed the above documentation for accuracy and completeness, and I agree with the above.   *  Total Encounter Time as defined by the Centers for Medicare and Medicaid Services includes, in addition to the face-to-face time of a patient visit (documented in the note above) non-face-to-face time: obtaining and reviewing outside history, ordering and  reviewing medications, tests or procedures, care coordination (communications with other health care professionals or caregivers) and documentation in the medical record.

## 2020-03-18 ENCOUNTER — Inpatient Hospital Stay: Payer: Medicare Other

## 2020-03-18 ENCOUNTER — Other Ambulatory Visit: Payer: Self-pay

## 2020-03-18 ENCOUNTER — Inpatient Hospital Stay: Payer: Medicare Other | Attending: Oncology | Admitting: Oncology

## 2020-03-18 VITALS — BP 153/75 | HR 58 | Temp 98.0°F | Resp 17 | Ht 65.0 in | Wt 115.6 lb

## 2020-03-18 DIAGNOSIS — Z79810 Long term (current) use of selective estrogen receptor modulators (SERMs): Secondary | ICD-10-CM | POA: Diagnosis not present

## 2020-03-18 DIAGNOSIS — Z170 Estrogen receptor positive status [ER+]: Secondary | ICD-10-CM | POA: Diagnosis not present

## 2020-03-18 DIAGNOSIS — C50212 Malignant neoplasm of upper-inner quadrant of left female breast: Secondary | ICD-10-CM | POA: Insufficient documentation

## 2020-03-18 DIAGNOSIS — G5692 Unspecified mononeuropathy of left upper limb: Secondary | ICD-10-CM

## 2020-03-18 DIAGNOSIS — E559 Vitamin D deficiency, unspecified: Secondary | ICD-10-CM

## 2020-03-18 LAB — CBC WITH DIFFERENTIAL/PLATELET
Abs Immature Granulocytes: 0.03 10*3/uL (ref 0.00–0.07)
Basophils Absolute: 0 10*3/uL (ref 0.0–0.1)
Basophils Relative: 1 %
Eosinophils Absolute: 0.2 10*3/uL (ref 0.0–0.5)
Eosinophils Relative: 3 %
HCT: 42.4 % (ref 36.0–46.0)
Hemoglobin: 13.7 g/dL (ref 12.0–15.0)
Immature Granulocytes: 1 %
Lymphocytes Relative: 18 %
Lymphs Abs: 1 10*3/uL (ref 0.7–4.0)
MCH: 31.3 pg (ref 26.0–34.0)
MCHC: 32.3 g/dL (ref 30.0–36.0)
MCV: 96.8 fL (ref 80.0–100.0)
Monocytes Absolute: 0.6 10*3/uL (ref 0.1–1.0)
Monocytes Relative: 12 %
Neutro Abs: 3.5 10*3/uL (ref 1.7–7.7)
Neutrophils Relative %: 65 %
Platelets: 141 10*3/uL — ABNORMAL LOW (ref 150–400)
RBC: 4.38 MIL/uL (ref 3.87–5.11)
RDW: 13.7 % (ref 11.5–15.5)
WBC: 5.4 10*3/uL (ref 4.0–10.5)
nRBC: 0 % (ref 0.0–0.2)

## 2020-03-18 LAB — COMPREHENSIVE METABOLIC PANEL
ALT: 15 U/L (ref 0–44)
AST: 21 U/L (ref 15–41)
Albumin: 3.6 g/dL (ref 3.5–5.0)
Alkaline Phosphatase: 31 U/L — ABNORMAL LOW (ref 38–126)
Anion gap: 4 — ABNORMAL LOW (ref 5–15)
BUN: 19 mg/dL (ref 8–23)
CO2: 32 mmol/L (ref 22–32)
Calcium: 9 mg/dL (ref 8.9–10.3)
Chloride: 105 mmol/L (ref 98–111)
Creatinine, Ser: 0.71 mg/dL (ref 0.44–1.00)
GFR, Estimated: >60 mL/min (ref >60)
Glucose, Bld: 89 mg/dL (ref 70–99)
Potassium: 3.9 mmol/L (ref 3.5–5.1)
Sodium: 141 mmol/L (ref 135–145)
Total Bilirubin: 0.4 mg/dL (ref 0.3–1.2)
Total Protein: 6.2 g/dL — ABNORMAL LOW (ref 6.5–8.1)

## 2020-03-19 ENCOUNTER — Ambulatory Visit (INDEPENDENT_AMBULATORY_CARE_PROVIDER_SITE_OTHER): Payer: Medicare Other

## 2020-03-19 DIAGNOSIS — Z23 Encounter for immunization: Secondary | ICD-10-CM

## 2020-03-19 NOTE — Progress Notes (Signed)
   Covid-19 Vaccination Clinic  Name:  [facility]    MRN: [id number] DOB: 10-18-1945  03/19/2020  [facility] was observed post Covid-19 immunization for 15 minutes without incident. She was provided with Vaccine Information Sheet and instruction to access the V-Safe system.   [facility] was instructed to call 911 with any severe reactions post vaccine: Marland Kitchen Difficulty breathing  . Swelling of face and throat  . A fast heartbeat  . A bad rash all over body  . Dizziness and weakness

## 2020-03-21 ENCOUNTER — Telehealth: Payer: Self-pay | Admitting: Oncology

## 2020-03-21 NOTE — Telephone Encounter (Signed)
Scheduled appt per 11/2 los - left message and mailed reminder letter.

## 2020-03-24 ENCOUNTER — Encounter (INDEPENDENT_AMBULATORY_CARE_PROVIDER_SITE_OTHER): Payer: Self-pay

## 2020-03-31 ENCOUNTER — Encounter (INDEPENDENT_AMBULATORY_CARE_PROVIDER_SITE_OTHER): Payer: Self-pay

## 2020-04-05 ENCOUNTER — Other Ambulatory Visit: Payer: Self-pay | Admitting: Family Medicine

## 2020-04-07 ENCOUNTER — Encounter (INDEPENDENT_AMBULATORY_CARE_PROVIDER_SITE_OTHER): Payer: Self-pay

## 2020-04-09 ENCOUNTER — Other Ambulatory Visit: Payer: Self-pay | Admitting: Family Medicine

## 2020-04-14 ENCOUNTER — Encounter (INDEPENDENT_AMBULATORY_CARE_PROVIDER_SITE_OTHER): Payer: Self-pay

## 2020-04-14 ENCOUNTER — Encounter: Payer: Self-pay | Admitting: Dermatology

## 2020-04-14 NOTE — Progress Notes (Signed)
   Follow-Up Visit   Subjective  [facility] is a 74 y.o. female who presents for the following: Annual Exam (here for full skin exam- no new conerns).  General skin check Location:  Duration:  Quality:  Associated Signs/Symptoms: Modifying Factors:  Severity:  Timing: Context:   Objective  Well appearing patient in no apparent distress; mood and affect are within normal limits.  A full examination was performed including scalp, head, eyes, ears, nose, lips, neck, chest, axillae, abdomen, back, buttocks, bilateral upper extremities, bilateral lower extremities, hands, feet, fingers, toes, fingernails, and toenails. All findings within normal limits unless otherwise noted below.   Assessment & Plan    History of basal cell cancer Left Nasal Sidewall  Yearly skin check  Encounter for screening for malignant neoplasm of skin Chest - Medial (Center)  Yearly skin check  Seborrheic keratosis (4) Left Abdomen (side) - Upper; Left Abdomen (side) - Lower; Right Inframammary Fold; Left Upper Back  If stable no need to remove      I, Lavonna Monarch, MD, have reviewed all documentation for this visit.  The documentation on 04/14/20 for the exam, diagnosis, procedures, and orders are all accurate and complete.

## 2020-04-21 ENCOUNTER — Encounter (INDEPENDENT_AMBULATORY_CARE_PROVIDER_SITE_OTHER): Payer: Self-pay

## 2020-04-28 ENCOUNTER — Encounter (INDEPENDENT_AMBULATORY_CARE_PROVIDER_SITE_OTHER): Payer: Self-pay

## 2020-05-05 ENCOUNTER — Encounter (INDEPENDENT_AMBULATORY_CARE_PROVIDER_SITE_OTHER): Payer: Self-pay

## 2020-05-12 ENCOUNTER — Encounter (INDEPENDENT_AMBULATORY_CARE_PROVIDER_SITE_OTHER): Payer: Self-pay

## 2020-05-13 ENCOUNTER — Other Ambulatory Visit: Payer: Self-pay

## 2020-05-13 ENCOUNTER — Ambulatory Visit
Admission: RE | Admit: 2020-05-13 | Discharge: 2020-05-13 | Disposition: A | Discharge status: Home / Self Care | Payer: Medicare Other | Source: Ambulatory Visit | Attending: Oncology | Admitting: Oncology

## 2020-05-13 DIAGNOSIS — C50212 Malignant neoplasm of upper-inner quadrant of left female breast: Secondary | ICD-10-CM

## 2020-05-13 DIAGNOSIS — Z170 Estrogen receptor positive status [ER+]: Secondary | ICD-10-CM

## 2020-05-13 HISTORY — DX: Personal history of irradiation: Z92.3

## 2020-05-19 ENCOUNTER — Encounter (INDEPENDENT_AMBULATORY_CARE_PROVIDER_SITE_OTHER): Payer: Self-pay

## 2020-05-21 ENCOUNTER — Encounter: Payer: Self-pay | Admitting: Family Medicine

## 2020-05-21 ENCOUNTER — Ambulatory Visit (INDEPENDENT_AMBULATORY_CARE_PROVIDER_SITE_OTHER): Payer: Medicare Other | Admitting: Family Medicine

## 2020-05-21 ENCOUNTER — Other Ambulatory Visit: Payer: Self-pay

## 2020-05-21 VITALS — BP 116/59 | HR 79 | Temp 97.5°F | Ht 65.0 in | Wt 114.0 lb

## 2020-05-21 DIAGNOSIS — Z170 Estrogen receptor positive status [ER+]: Secondary | ICD-10-CM

## 2020-05-21 DIAGNOSIS — E559 Vitamin D deficiency, unspecified: Secondary | ICD-10-CM | POA: Diagnosis not present

## 2020-05-21 DIAGNOSIS — M62838 Other muscle spasm: Secondary | ICD-10-CM

## 2020-05-21 DIAGNOSIS — M8589 Other specified disorders of bone density and structure, multiple sites: Secondary | ICD-10-CM | POA: Diagnosis not present

## 2020-05-21 DIAGNOSIS — C50212 Malignant neoplasm of upper-inner quadrant of left female breast: Secondary | ICD-10-CM | POA: Diagnosis not present

## 2020-05-21 DIAGNOSIS — E034 Atrophy of thyroid (acquired): Secondary | ICD-10-CM

## 2020-05-21 MED ORDER — TIZANIDINE HCL 4 MG PO TABS: 2.0000 mg | ORAL_TABLET | Freq: Three times a day (TID) | ORAL | 0 refills | Status: DC | PRN

## 2020-05-21 NOTE — Progress Notes (Signed)
Subjective: CC: Follow-up hypothyroidism, hyperlipidemia PCP: Janora Norlander, DO [facility] is a 75 y.o. female presenting to clinic today for:  1.  Hypothyroidism Patient reports compliance with Synthroid 25 mcg daily.  No reports of heart palpitations, tremor.  2.  Hyperlipidemia Patient is compliant with Crestor 5 mg daily.  She is not fasting today.  No reports of chest pain or shortness of breath  3.  Neck pain Patient reports acute onset of left-sided neck pain this morning.  She is not yet taken anything for this neck pain.  Denies any upper extremity weakness or sensory changes.  No preceding illness.  She has known degenerative changes within her back.  4.  Breast cancer Patient continues to take tamoxifen daily as prescribed.  Her next office visit with Dr. Jana Hakim will not be till November of this year.  She is up-to-date on mammography.  No reports of concerning symptoms or signs.  ROS: Per HPI  Allergies  Allergen Reactions  . Macrodantin   . Sulfa Antibiotics   . Wellbutrin [Bupropion Hcl]    Past Medical History:  Diagnosis Date  . Allergy   . Anxiety   . Atypical mole 11/23/2002   moderate-right lower back(WS)  . Basal cell carcinoma 02/06/2019   nod-left nare ([facility])  . Breast cancer ([place])   . Cataract   . Depression   . History of colon polyps   . Hyperlipidemia   . Hypothyroidism   . Interstitial cystitis    History of  . Osteopenia   . Personal history of radiation therapy   . S/P lumpectomy, left breast 04/03/2019  . Skin cancer    removed from left nostril  . Vitamin D deficiency     Current Outpatient Medications:  .  aspirin EC 81 MG tablet, Take 81 mg by mouth daily. Swallow whole., Disp: , Rfl:  .  azelastine (ASTELIN) 0.1 % nasal spray, Place 1-2 sprays into both nostrils at bedtime., Disp: 30 mL, Rfl: 12 .  Black Pepper-Turmeric (TURMERIC COMPLEX/BLACK PEPPER PO), Take by mouth., Disp: , Rfl:  .  Cholecalciferol (VITAMIN  D3) 1000 UNITS CAPS, Take 2 capsules by mouth daily. , Disp: , Rfl:  .  cycloSPORINE (RESTASIS) 0.05 % ophthalmic emulsion, Place 1 drop into both eyes 2 (two) times daily., Disp: , Rfl:  .  escitalopram (LEXAPRO) 10 MG tablet, TAKE (1) TABLET DAILY AS DIRECTED., Disp: 90 tablet, Rfl: 0 .  estradiol (ESTRACE) 0.1 MG/GM vaginal cream, INSERT 1 VAGINALLY AT BEDTIME, Disp: 42.5 g, Rfl: 0 .  fluticasone (FLONASE) 50 MCG/ACT nasal spray, 1 SPRAY IN EACH NOSTRIL EVERY 12 HOURS, Disp: 48 g, Rfl: 1 .  levothyroxine (SYNTHROID) 25 MCG tablet, TAKE 1 TABLET EVERY DAY BEFORE BREAKFAST, Disp: 90 tablet, Rfl: 3 .  Multiple Vitamins-Minerals (WOMENS 50+ MULTI VITAMIN/MIN PO), Take by mouth., Disp: , Rfl:  .  Omega-3 Fatty Acids (SUPER OMEGA-3 PO), Take by mouth., Disp: , Rfl:  .  Polyethylene Glycol 3350 (MIRALAX PO), Take 1 packet by mouth daily as needed.  , Disp: , Rfl:  .  rosuvastatin (CRESTOR) 5 MG tablet, TAKE (1) TABLET DAILY AS DIRECTED., Disp: 90 tablet, Rfl: 3 .  Specialty Vitamins Products (ONE-A-DAY BONE STRENGTH) 500-28-100 MG-MG-UNIT TABS, Take 1 tablet by mouth 3 (three) times daily., Disp: , Rfl:  .  tamoxifen (NOLVADEX) 20 MG tablet, Take 20 mg by mouth daily., Disp: , Rfl:  Social History   Socioeconomic History  . Marital status: Married  Spouse name: Ed  . Number of children: 2  . Years of education: 16  . Highest education level: Bachelor's degree (e.g., BA, AB, BS)  Occupational History  . Occupation: Retired    Comment: Geophysicist/field seismologist  Tobacco Use  . Smoking status: Never Smoker  . Smokeless tobacco: Never Used  Vaping Use  . Vaping Use: Never used  Substance and Sexual Activity  . Alcohol use: Yes    Alcohol/week: 7.0 standard drinks    Types: 7 Standard drinks or equivalent per week    Comment: wine nightly  . Drug use: No  . Sexual activity: Yes    Birth control/protection: Post-menopausal  Other Topics Concern  . Not on file  Social History Narrative    Lives with Ed   2 cats    2 children = neither local    Social Determinants of Health   Financial Resource Strain: Low Risk   . Difficulty of Paying Living Expenses: Not hard at all  Food Insecurity: No Food Insecurity  . Worried About Charity fundraiser in the Last Year: Never true  . Ran Out of Food in the Last Year: Never true  Transportation Needs: No Transportation Needs  . Lack of Transportation (Medical): No  . Lack of Transportation (Non-Medical): No  Physical Activity: Sufficiently Active  . Days of Exercise per Week: 5 days  . Minutes of Exercise per Session: 30 min  Stress: No Stress Concern Present  . Feeling of Stress : Only a little  Social Connections: Socially Integrated  . Frequency of Communication with Friends and Family: More than three times a week  . Frequency of Social Gatherings with Friends and Family: More than three times a week  . Attends Religious Services: More than 4 times per year  . Active Member of Clubs or Organizations: Yes  . Attends Archivist Meetings: More than 4 times per year  . Marital Status: Married  Human resources officer Violence: Not At Risk  . Fear of Current or Ex-Partner: No  . Emotionally Abused: No  . Physically Abused: No  . Sexually Abused: No   Family History  Problem Relation Age of Onset  . Heart attack Father   . Congestive Heart Failure Father   . Osteoporosis Maternal Aunt   . Uterine cancer Maternal Aunt   . Cancer Mother        possible lung - spread   . Early death Mother   . Stroke Maternal Grandmother   . Breast cancer Paternal Grandmother   . Stroke Paternal Grandfather     Objective: Office vital signs reviewed. BP (!) 116/59   Pulse 79   Temp (!) 97.5 F (36.4 C) (Temporal)   Ht '5\' 5"'  (1.651 m)   Wt 114 lb (51.7 kg)   SpO2 99%   BMI 18.97 kg/m   Physical Examination:  General: Awake, alert, thin female, No acute distress HEENT: Normal; no exophthalmos.  No goiter. Cardio: regular  rate and rhythm, S1S2 heard, no murmurs appreciated Pulm: clear to auscultation bilaterally, no wheezes, rhonchi or rales; normal work of breathing on room air MSK: normal gait and station  Cervical spine: She has full active range of motion in extension, flexion, rotation.  No midline tenderness palpation.  Mild paraspinal muscle tenderness of patient on the left.  No appreciable increased tonicity however.  Assessment/ Plan: 75 y.o. female   Hypothyroidism due to acquired atrophy of thyroid - Plan: DG [facility] DEXA, CMP14+EGFR, Lipid Panel,  Thyroid Panel With TSH, CANCELED: Thyroid Panel With TSH, CANCELED: CMP14+EGFR, CANCELED: Lipid Panel  Malignant neoplasm of upper-inner quadrant of left breast in female, estrogen receptor positive ([place]) - Plan: DG [facility] DEXA, CMP14+EGFR, CBC with Differential, CANCELED: CMP14+EGFR, CANCELED: CBC with Differential  Vitamin D deficiency - Plan: DG [facility] DEXA, VITAMIN D 25 Hydroxy (Vit-D Deficiency, Fractures), CANCELED: VITAMIN D 25 Hydroxy (Vit-D Deficiency, Fractures)  Osteopenia of multiple sites - Plan: DG [facility] DEXA  Muscle spasms of neck - Plan: tiZANidine (ZANAFLEX) 4 MG tablet  She is asymptomatic from a thyroid standpoint.  She will come in for fasting labs.  We will check CBC and sent to Dr. Jana Hakim.  Currently treated with tamoxifen for breast cancer  Plan for DEXA scan, check a vitamin D given known osteopenia.  As far as her neck spasm goes I will give her a prescription to have on hand for the muscle relaxer but have highly recommend that she use heat, home physical therapy stretches that were provided prior to the medication as this is sedating.  No orders of the defined types were placed in this encounter.  No orders of the defined types were placed in this encounter.    Janora Norlander, DO [facility] [phone]

## 2020-05-21 NOTE — Patient Instructions (Signed)
Come in for fasting labs. Heat, stretches for neck. Muscle relaxer sent in case it's needed.

## 2020-05-23 ENCOUNTER — Other Ambulatory Visit: Payer: Medicare Other

## 2020-05-23 DIAGNOSIS — C50212 Malignant neoplasm of upper-inner quadrant of left female breast: Secondary | ICD-10-CM | POA: Diagnosis not present

## 2020-05-23 DIAGNOSIS — Z170 Estrogen receptor positive status [ER+]: Secondary | ICD-10-CM | POA: Diagnosis not present

## 2020-05-23 DIAGNOSIS — E034 Atrophy of thyroid (acquired): Secondary | ICD-10-CM | POA: Diagnosis not present

## 2020-05-23 DIAGNOSIS — E559 Vitamin D deficiency, unspecified: Secondary | ICD-10-CM | POA: Diagnosis not present

## 2020-05-24 LAB — CBC WITH DIFFERENTIAL/PLATELET
Basophils Absolute: 0 10*3/uL (ref 0.0–0.2)
Basos: 1 %
EOS (ABSOLUTE): 0.2 10*3/uL (ref 0.0–0.4)
Eos: 4 %
Hematocrit: 42.2 % (ref 34.0–46.6)
Hemoglobin: 14 g/dL (ref 11.1–15.9)
Immature Grans (Abs): 0 10*3/uL (ref 0.0–0.1)
Immature Granulocytes: 0 %
Lymphocytes Absolute: 1 10*3/uL (ref 0.7–3.1)
Lymphs: 22 %
MCH: 31.5 pg (ref 26.6–33.0)
MCHC: 33.2 g/dL (ref 31.5–35.7)
MCV: 95 fL (ref 79–97)
Monocytes Absolute: 0.5 10*3/uL (ref 0.1–0.9)
Monocytes: 11 %
Neutrophils Absolute: 2.9 10*3/uL (ref 1.4–7.0)
Neutrophils: 62 %
Platelets: 162 10*3/uL (ref 150–450)
RBC: 4.44 x10E6/uL (ref 3.77–5.28)
RDW: 13 % (ref 11.7–15.4)
WBC: 4.7 10*3/uL (ref 3.4–10.8)

## 2020-05-24 LAB — CMP14+EGFR
ALT: 15 IU/L (ref 0–32)
AST: 23 IU/L (ref 0–40)
Albumin/Globulin Ratio: 2 (ref 1.2–2.2)
Albumin: 4.2 g/dL (ref 3.7–4.7)
Alkaline Phosphatase: 36 IU/L — ABNORMAL LOW (ref 44–121)
BUN/Creatinine Ratio: 19 (ref 12–28)
BUN: 14 mg/dL (ref 8–27)
Bilirubin Total: 0.4 mg/dL (ref 0.0–1.2)
CO2: 25 mmol/L (ref 20–29)
Calcium: 9.1 mg/dL (ref 8.7–10.3)
Chloride: 105 mmol/L (ref 96–106)
Creatinine, Ser: 0.74 mg/dL (ref 0.57–1.00)
GFR calc Af Amer: 92 mL/min/{1.73_m2} (ref >59)
GFR calc non Af Amer: 80 mL/min/{1.73_m2} (ref >59)
Globulin, Total: 2.1 g/dL (ref 1.5–4.5)
Glucose: 83 mg/dL (ref 65–99)
Potassium: 4.1 mmol/L (ref 3.5–5.2)
Sodium: 144 mmol/L (ref 134–144)
Total Protein: 6.3 g/dL (ref 6.0–8.5)

## 2020-05-24 LAB — VITAMIN D 25 HYDROXY (VIT D DEFICIENCY, FRACTURES): Vit D, 25-Hydroxy: 58.3 ng/mL (ref 30.0–100.0)

## 2020-05-24 LAB — THYROID PANEL WITH TSH
Free Thyroxine Index: 1.3 (ref 1.2–4.9)
T3 Uptake Ratio: 22 % — ABNORMAL LOW (ref 24–39)
T4, Total: 5.8 ug/dL (ref 4.5–12.0)
TSH: 2.84 u[IU]/mL (ref 0.450–4.500)

## 2020-05-24 LAB — LIPID PANEL
Chol/HDL Ratio: 1.9 ratio (ref 0.0–4.4)
Cholesterol, Total: 163 mg/dL (ref 100–199)
HDL: 84 mg/dL (ref >39)
LDL Chol Calc (NIH): 65 mg/dL (ref 0–99)
Triglycerides: 75 mg/dL (ref 0–149)
VLDL Cholesterol Cal: 14 mg/dL (ref 5–40)

## 2020-05-26 ENCOUNTER — Encounter (INDEPENDENT_AMBULATORY_CARE_PROVIDER_SITE_OTHER): Payer: Self-pay

## 2020-06-02 ENCOUNTER — Encounter (INDEPENDENT_AMBULATORY_CARE_PROVIDER_SITE_OTHER): Payer: Self-pay

## 2020-06-03 ENCOUNTER — Other Ambulatory Visit: Payer: Self-pay | Admitting: Family Medicine

## 2020-06-09 ENCOUNTER — Encounter (INDEPENDENT_AMBULATORY_CARE_PROVIDER_SITE_OTHER): Payer: Self-pay

## 2020-06-16 ENCOUNTER — Encounter (INDEPENDENT_AMBULATORY_CARE_PROVIDER_SITE_OTHER): Payer: Self-pay

## 2020-06-18 ENCOUNTER — Ambulatory Visit (INDEPENDENT_AMBULATORY_CARE_PROVIDER_SITE_OTHER): Payer: Medicare Other

## 2020-06-18 ENCOUNTER — Other Ambulatory Visit: Payer: Self-pay

## 2020-06-18 DIAGNOSIS — M8589 Other specified disorders of bone density and structure, multiple sites: Secondary | ICD-10-CM | POA: Diagnosis not present

## 2020-06-18 DIAGNOSIS — M85832 Other specified disorders of bone density and structure, left forearm: Secondary | ICD-10-CM | POA: Diagnosis not present

## 2020-06-18 DIAGNOSIS — Z780 Asymptomatic menopausal state: Secondary | ICD-10-CM | POA: Diagnosis not present

## 2020-06-20 ENCOUNTER — Encounter (INDEPENDENT_AMBULATORY_CARE_PROVIDER_SITE_OTHER): Payer: Self-pay

## 2020-06-23 ENCOUNTER — Encounter (INDEPENDENT_AMBULATORY_CARE_PROVIDER_SITE_OTHER): Payer: Self-pay

## 2020-06-23 ENCOUNTER — Other Ambulatory Visit: Payer: Self-pay | Admitting: Oncology

## 2020-06-26 ENCOUNTER — Encounter (INDEPENDENT_AMBULATORY_CARE_PROVIDER_SITE_OTHER): Payer: Self-pay

## 2020-06-30 ENCOUNTER — Encounter (INDEPENDENT_AMBULATORY_CARE_PROVIDER_SITE_OTHER): Payer: Self-pay

## 2020-07-08 ENCOUNTER — Telehealth: Payer: Self-pay

## 2020-07-08 NOTE — Telephone Encounter (Signed)
Yes, Prolia has been shown to have a positive impact in cancer patients.  He was amenable to Prolia.

## 2020-07-08 NOTE — Telephone Encounter (Signed)
Pt aware and it will take a couple of weeks for it to arrive

## 2020-07-29 DIAGNOSIS — C50912 Malignant neoplasm of unspecified site of left female breast: Secondary | ICD-10-CM | POA: Diagnosis not present

## 2020-08-12 ENCOUNTER — Other Ambulatory Visit: Payer: Self-pay | Admitting: Family Medicine

## 2020-08-15 ENCOUNTER — Other Ambulatory Visit: Payer: Self-pay

## 2020-08-15 ENCOUNTER — Ambulatory Visit (INDEPENDENT_AMBULATORY_CARE_PROVIDER_SITE_OTHER): Payer: Medicare Other | Admitting: Nurse Practitioner

## 2020-08-15 ENCOUNTER — Encounter: Payer: Self-pay | Admitting: Nurse Practitioner

## 2020-08-15 VITALS — BP 121/67 | HR 60 | Temp 97.8°F | Ht 65.0 in | Wt 117.0 lb

## 2020-08-15 DIAGNOSIS — M62838 Other muscle spasm: Secondary | ICD-10-CM | POA: Insufficient documentation

## 2020-08-15 MED ORDER — IBUPROFEN 600 MG PO TABS: 600.0000 mg | ORAL_TABLET | Freq: Three times a day (TID) | ORAL | 0 refills | Status: DC | PRN

## 2020-08-15 NOTE — Assessment & Plan Note (Signed)
Neck pain worsened in the last 3 days from sleeping on left side.  Patient reports using old muscle relaxant over-the-counter with no therapeutic effect.  Advised patient to use anti-inflammatory ibuprofen, ice or warm compress as tolerated on neck and shoulders.  Follow-up if symptoms are worse or unresolved.  Education provided to patient with printed handouts given   Rx sent to pharmacy.

## 2020-08-15 NOTE — Progress Notes (Signed)
Acute Office Visit  Subjective:    Patient ID: Kayla Randolph, female    DOB: [birth year], 75 y.o.   MRN: [id number]  Chief Complaint  Patient presents with  . Neck Pain    Neck Pain  This is a new problem. Episode onset: In the past 3 days. The problem occurs constantly. The problem has been unchanged. Associated with: Sleep. The pain is present in the left side. The quality of the pain is described as aching. The pain is at a severity of 5/10. The symptoms are aggravated by bending and position. The pain is same all the time. Pertinent negatives include no chest pain, headaches, leg pain, numbness or tingling. She has tried muscle relaxants for the symptoms. The treatment provided mild relief.     Past Medical History:  Diagnosis Date  . Allergy   . Anxiety   . Atypical mole 11/23/2002   moderate-right lower back(WS)  . Basal cell carcinoma 02/06/2019   nod-left nare (MO[facility])  . Breast cancer (HCC)   . Cataract   . Depression   . History of colon polyps   . Hyperlipidemia   . Hypothyroidism   . Interstitial cystitis    History of  . Osteopenia   . Personal history of radiation therapy   . S/P lumpectomy, left breast 04/03/2019  . Skin cancer    removed from left nostril  . Vitamin D deficiency     Past Surgical History:  Procedure Laterality Date  . BREAST BIOPSY Left 03/13/2019  . BREAST LUMPECTOMY Left 04/03/2019  . BREAST LUMPECTOMY WITH RADIOACTIVE SEED AND SENTINEL LYMPH NODE BIOPSY Left 04/03/2019   Procedure: LEFT BREAST LUMPECTOMY WITH RADIOACTIVE SEED AND SENTINEL LYMPH NODE MAPPING;  Surgeon: Harriette Bouillon, MD;  Location: [facility];  Service: General;  Laterality: Left;  . CESAREAN SECTION    . EYE SURGERY Right    cataract  . EYE SURGERY Left    cataract  . TONSILLECTOMY      Family History  Problem Relation Age of Onset  . Heart attack Father   . Congestive Heart Failure Father   . Osteoporosis Maternal Aunt   . Uterine cancer  Maternal Aunt   . Cancer Mother        possible lung - spread   . Early death Mother   . Stroke Maternal Grandmother   . Breast cancer Paternal Grandmother   . Stroke Paternal Grandfather     Social History   Socioeconomic History  . Marital status: Married    Spouse name: Ed  . Number of children: 2  . Years of education: 16  . Highest education level: Bachelor's degree (e.g., BA, AB, BS)  Occupational History  . Occupation: Retired    Comment: Software engineer  Tobacco Use  . Smoking status: Never Smoker  . Smokeless tobacco: Never Used  Vaping Use  . Vaping Use: Never used  Substance and Sexual Activity  . Alcohol use: Yes    Alcohol/week: 7.0 standard drinks    Types: 7 Standard drinks or equivalent per week    Comment: wine nightly  . Drug use: No  . Sexual activity: Yes    Birth control/protection: Post-menopausal  Other Topics Concern  . Not on file  Social History Narrative   Lives with Ed   2 cats    2 children = neither local    Social Determinants of Health   Financial Resource Strain: Low Risk   . Difficulty of  Paying Living Expenses: Not hard at all  Food Insecurity: No Food Insecurity  . Worried About Programme researcher, broadcasting/film/video in the Last Year: Never true  . Ran Out of Food in the Last Year: Never true  Transportation Needs: No Transportation Needs  . Lack of Transportation (Medical): No  . Lack of Transportation (Non-Medical): No  Physical Activity: Sufficiently Active  . Days of Exercise per Week: 5 days  . Minutes of Exercise per Session: 30 min  Stress: No Stress Concern Present  . Feeling of Stress : Only a little  Social Connections: Socially Integrated  . Frequency of Communication with Friends and Family: More than three times a week  . Frequency of Social Gatherings with Friends and Family: More than three times a week  . Attends Religious Services: More than 4 times per year  . Active Member of Clubs or Organizations: Yes  . Attends  Banker Meetings: More than 4 times per year  . Marital Status: Married  Catering manager Violence: Not At Risk  . Fear of Current or Ex-Partner: No  . Emotionally Abused: No  . Physically Abused: No  . Sexually Abused: No    Outpatient Medications Prior to Visit  Medication Sig Dispense Refill  . aspirin EC 81 MG tablet Take 81 mg by mouth daily. Swallow whole.    Marland Kitchen azelastine (ASTELIN) 0.1 % nasal spray Place 1-2 sprays into both nostrils at bedtime. 30 mL 12  . Black Pepper-Turmeric (TURMERIC COMPLEX/BLACK PEPPER PO) Take by mouth.    . Cholecalciferol (VITAMIN D3) 1000 UNITS CAPS Take 2 capsules by mouth daily.    . cycloSPORINE (RESTASIS) 0.05 % ophthalmic emulsion Place 1 drop into both eyes 2 (two) times daily.    Marland Kitchen estradiol (ESTRACE) 0.1 MG/GM vaginal cream INSERT 1 VAGINALLY AT BEDTIME 42.5 g 1  . fluticasone (FLONASE) 50 MCG/ACT nasal spray 1 SPRAY IN EACH NOSTRIL EVERY 12 HOURS 48 g 1  . levothyroxine (SYNTHROID) 25 MCG tablet TAKE 1 TABLET EVERY DAY BEFORE BREAKFAST 90 tablet 3  . Multiple Vitamins-Minerals (WOMENS 50+ MULTI VITAMIN/MIN PO) Take by mouth.    . Omega-3 Fatty Acids (SUPER OMEGA-3 PO) Take by mouth.    . Polyethylene Glycol 3350 (MIRALAX PO) Take 1 packet by mouth daily as needed.    . rosuvastatin (CRESTOR) 5 MG tablet TAKE (1) TABLET DAILY AS DIRECTED. 90 tablet 3  . Specialty Vitamins Products (ONE-A-DAY BONE STRENGTH) 500-28-100 MG-MG-UNIT TABS Take 1 tablet by mouth 3 (three) times daily.    . tamoxifen (NOLVADEX) 20 MG tablet Take 20 mg by mouth daily.    Marland Kitchen tiZANidine (ZANAFLEX) 4 MG tablet Take 0.5-1 tablets (2-4 mg total) by mouth every 8 (eight) hours as needed for muscle spasms. 30 tablet 0  . escitalopram (LEXAPRO) 10 MG tablet TAKE (1) TABLET DAILY AS DIRECTED. 90 tablet 0   No facility-administered medications prior to visit.    Allergies  Allergen Reactions  . Macrodantin   . Sulfa Antibiotics   . Wellbutrin [Bupropion Hcl]      Review of Systems  Constitutional: Negative.   HENT: Negative.   Respiratory: Negative.   Cardiovascular: Negative for chest pain.  Genitourinary: Negative.   Musculoskeletal: Positive for neck pain and neck stiffness.  Skin: Negative.   Neurological: Negative for tingling, numbness and headaches.  All other systems reviewed and are negative.      Objective:    Physical Exam Vitals reviewed.  Constitutional:  Appearance: Normal appearance.  HENT:     Head: Normocephalic.     Nose: Nose normal.  Eyes:     Conjunctiva/sclera: Conjunctivae normal.  Cardiovascular:     Rate and Rhythm: Normal rate and regular rhythm.     Pulses: Normal pulses.     Heart sounds: Normal heart sounds.  Pulmonary:     Effort: Pulmonary effort is normal.     Breath sounds: Normal breath sounds.  Abdominal:     General: Bowel sounds are normal.  Musculoskeletal:        General: Tenderness present.     Cervical back: Tenderness present.  Skin:    General: Skin is warm.  Neurological:     Mental Status: She is alert.     BP 121/67   Pulse 60   Temp 97.8 F (36.6 C) (Temporal)   Ht 5\' 5"  (1.651 m)   Wt 117 lb (53.1 kg)   BMI 19.47 kg/m  Wt Readings from Last 3 Encounters:  08/15/20 117 lb (53.1 kg)  05/21/20 114 lb (51.7 kg)  03/18/20 115 lb 9.6 oz (52.4 kg)    There are no preventive care reminders to display for this patient.  There are no preventive care reminders to display for this patient.   Lab Results  Component Value Date   TSH 2.840 05/23/2020   Lab Results  Component Value Date   WBC 4.7 05/23/2020   HGB 14.0 05/23/2020   HCT 42.2 05/23/2020   MCV 95 05/23/2020   PLT 162 05/23/2020   Lab Results  Component Value Date   NA 144 05/23/2020   K 4.1 05/23/2020   CO2 25 05/23/2020   GLUCOSE 83 05/23/2020   BUN 14 05/23/2020   CREATININE 0.74 05/23/2020   BILITOT 0.4 05/23/2020   ALKPHOS 36 (L) 05/23/2020   AST 23 05/23/2020   ALT 15 05/23/2020    PROT 6.3 05/23/2020   ALBUMIN 4.2 05/23/2020   CALCIUM 9.1 05/23/2020   ANIONGAP 4 (L) 03/18/2020   Lab Results  Component Value Date   CHOL 163 05/23/2020   Lab Results  Component Value Date   HDL 84 05/23/2020   Lab Results  Component Value Date   LDLCALC 65 05/23/2020   Lab Results  Component Value Date   TRIG 75 05/23/2020   Lab Results  Component Value Date   CHOLHDL 1.9 05/23/2020   No results found for: HGBA1C     Assessment & Plan:   Problem List Items Addressed This Visit      Musculoskeletal and Integument   Muscle spasms of neck - Primary    Neck pain worsened in the last 3 days from sleeping on left side.  Patient reports using old muscle relaxant over-the-counter with no therapeutic effect.  Advised patient to use anti-inflammatory ibuprofen, ice or warm compress as tolerated on neck and shoulders.  Follow-up if symptoms are worse or unresolved.  Education provided to patient with printed handouts given   Rx sent to pharmacy.      Relevant Medications   ibuprofen (ADVIL) 600 MG tablet       Meds ordered this encounter  Medications  . ibuprofen (ADVIL) 600 MG tablet    Sig: Take 1 tablet (600 mg total) by mouth every 8 (eight) hours as needed.    Dispense:  30 tablet    Refill:  0    Order Specific Question:   Supervising Provider    Answer:   [name] [[id number]]  Daryll Drown, NP

## 2020-08-15 NOTE — Patient Instructions (Signed)

## 2020-08-17 IMAGING — MG MM DIGITAL DIAGNOSTIC UNILAT*L* W/ TOMO
8 series · 9 of 20 positions shown · non-contrast
Comparison: Previous exam(s).

CLINICAL DATA: Patient recalled from screening for left breast mass
and calcifications.

EXAM:
DIGITAL DIAGNOSTIC LEFT MAMMOGRAM WITH CAD AND TOMO
ULTRASOUND LEFT BREAST

[L ML]
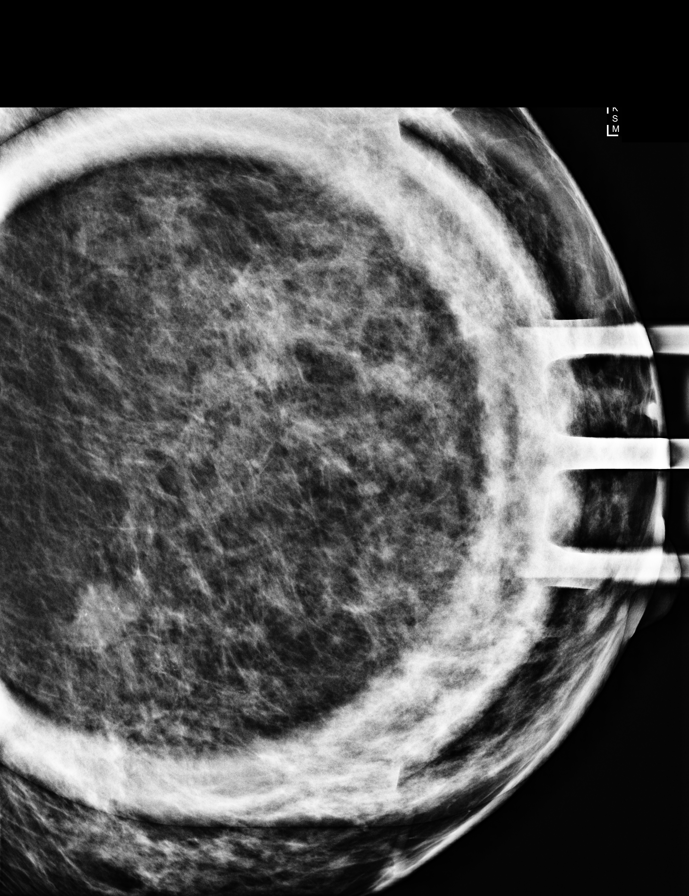

[L CC]
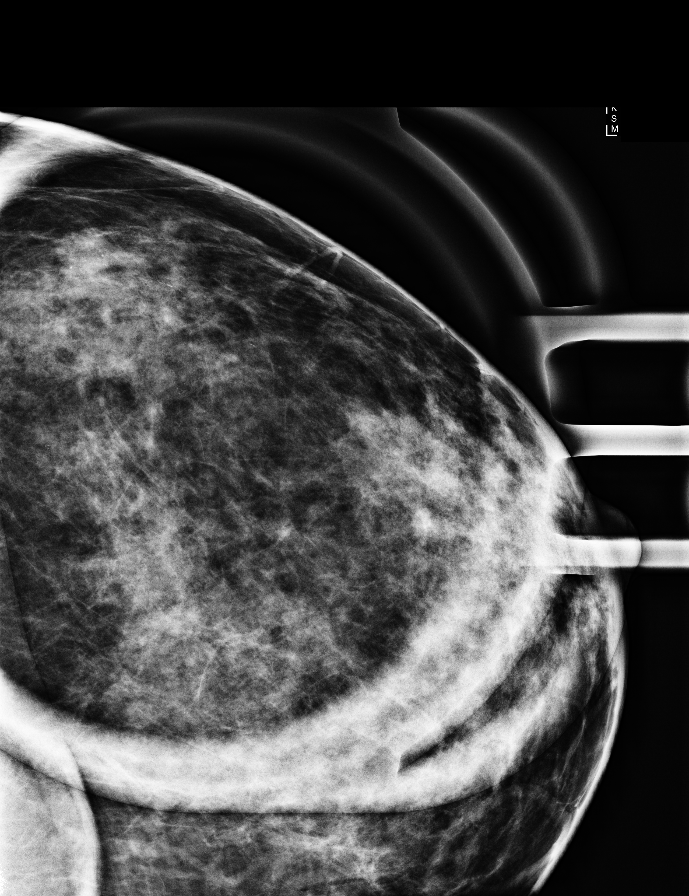

[L MLO synth-2D]
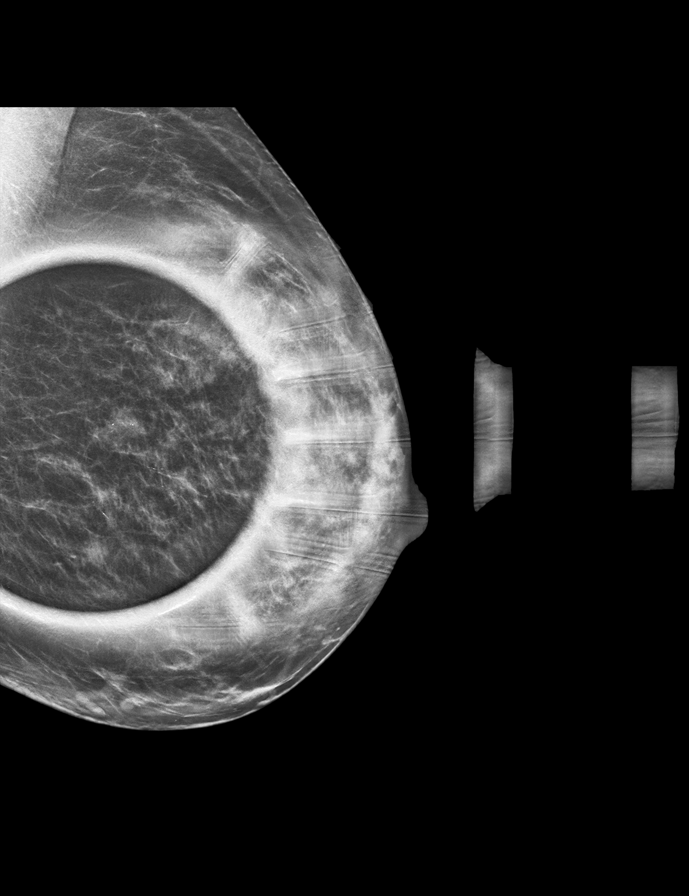

[L CC synth-2D]
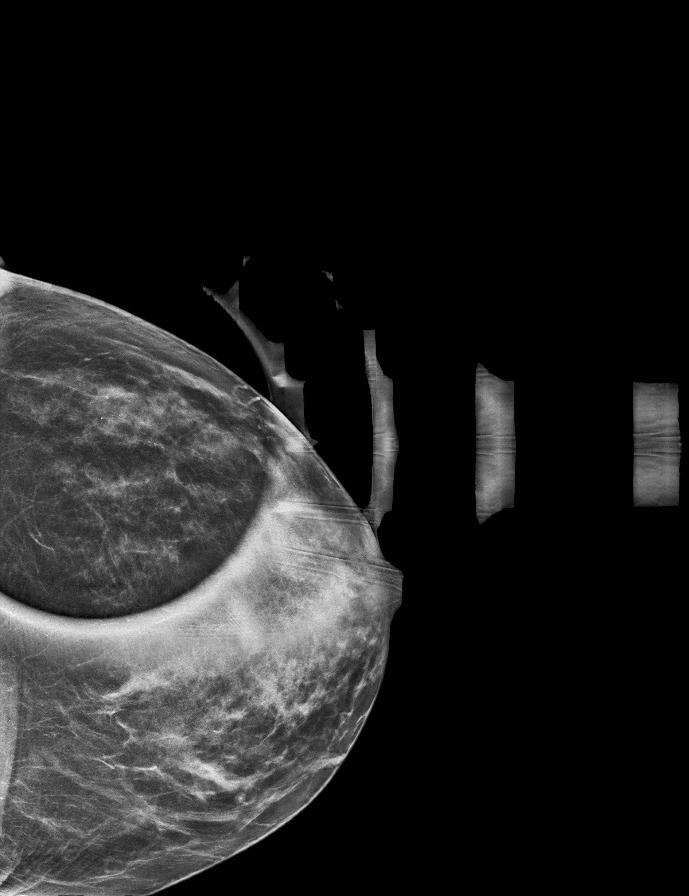

[L ML synth-2D]
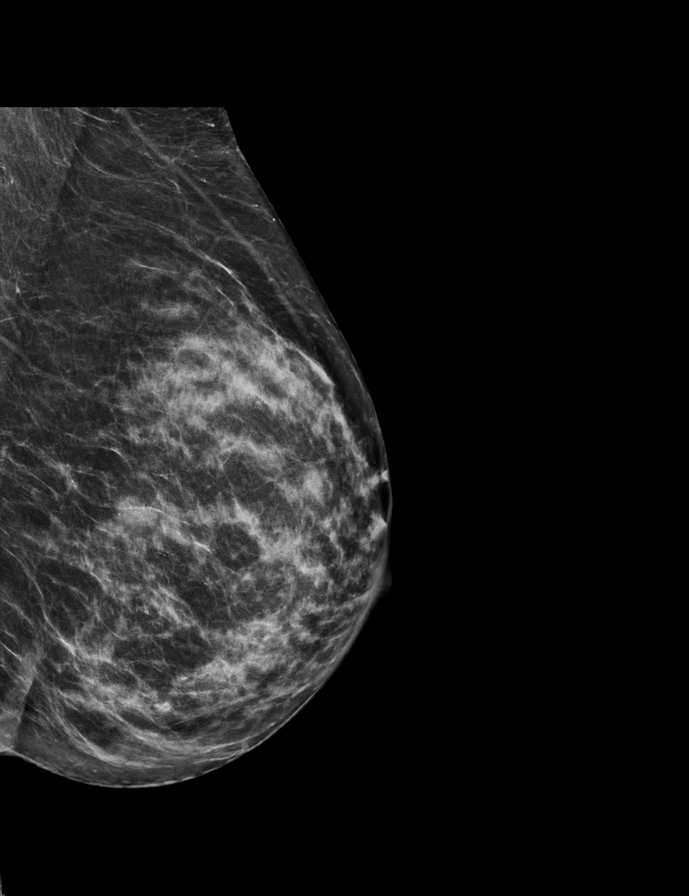

[L ML tomo · 2 of 58 frames shown]
[frame 19/58]
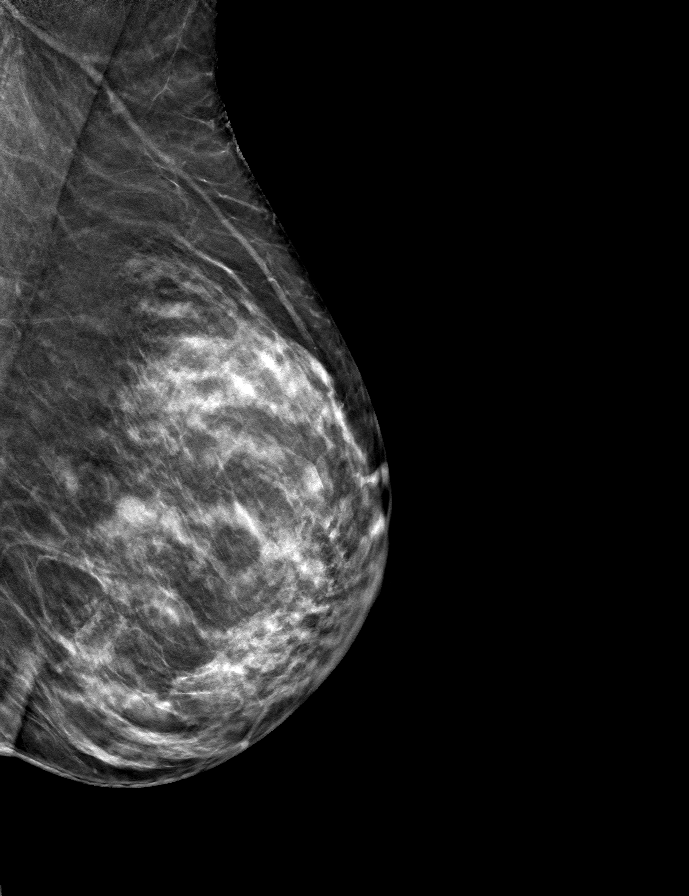
[frame 29/58]
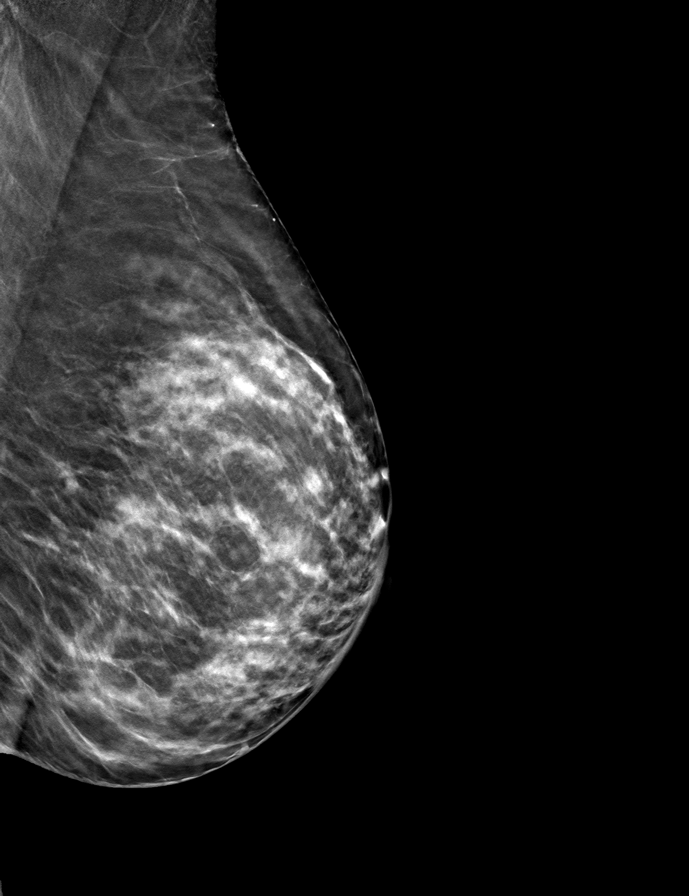

[L MLO tomo · tomo slice 27/54.0]
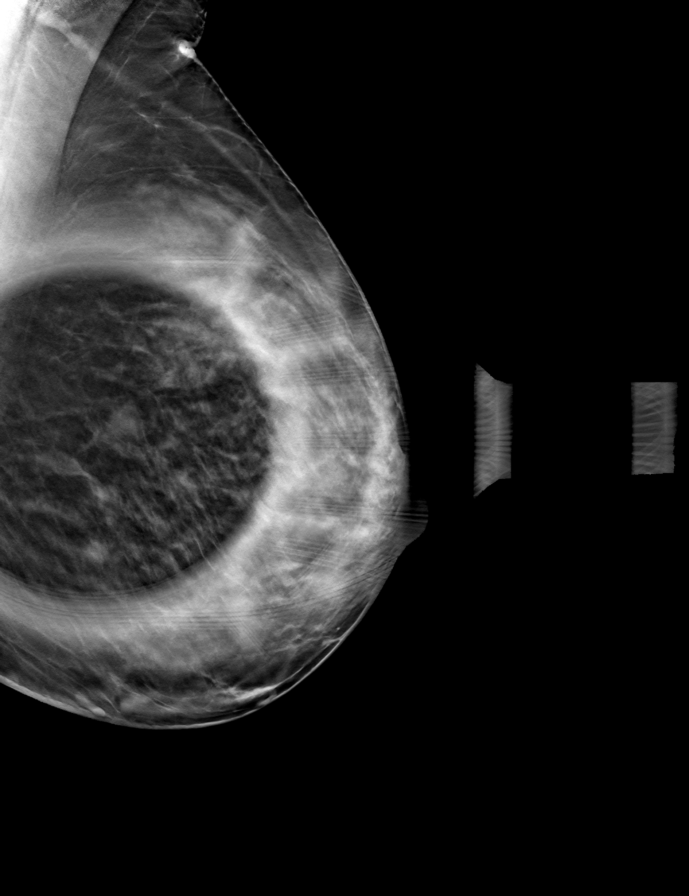

[L CC tomo · tomo slice 28/55.0]
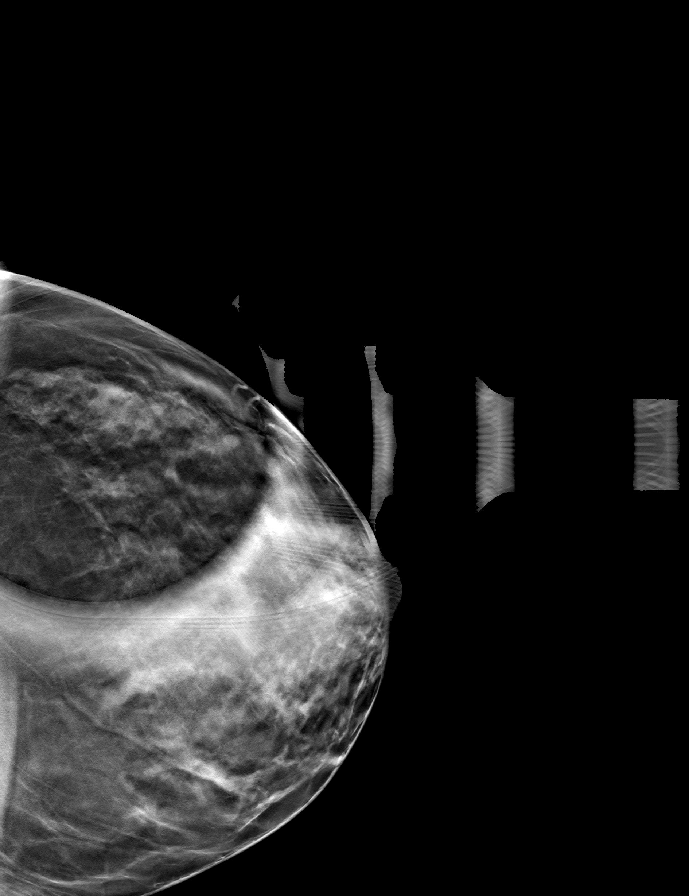

[9 of 20 positions shown; findings below may reference images not displayed]

ACR Breast Density Category c: The breast tissue is heterogeneously
dense, which may obscure small masses.
FINDINGS: Within the lateral left breast posterior depth there is a persistent
lobular mass, further evaluated with spot compression CC and MLO
tomosynthesis images. Located within the mass are a few punctate
calcifications. No additional concerning masses, calcifications or
distortion within the left breast.

Mammographic images were processed with CAD.

Targeted ultrasound is performed, showing a 1.1 x 1.0 x 1.0 cm
irregular hypoechoic mass left breast 3:30 o'clock 5 cm from the
nipple.

No left axillary adenopathy.
IMPRESSION: Suspicious left breast mass 3:30 o'clock with associated punctate
calcifications.

RECOMMENDATION:
Ultrasound-guided core needle biopsy left breast mass 3:30 o'clock.

I have discussed the findings and recommendations with the patient.
If applicable, a reminder letter will be sent to the patient
regarding the next appointment.

BI-RADS CATEGORY  5: Highly suggestive of malignancy.

## 2020-08-21 IMAGING — MG MM BREAST LOCALIZATION CLIP
4 series · 4 of 12 positions shown · non-contrast
Comparison: Previous exam(s).

CLINICAL DATA: Patient presents for biopsy of a left breast mass.

EXAM:
DIAGNOSTIC LEFT MAMMOGRAM POST ULTRASOUND BIOPSY

[L ML synth-2D]
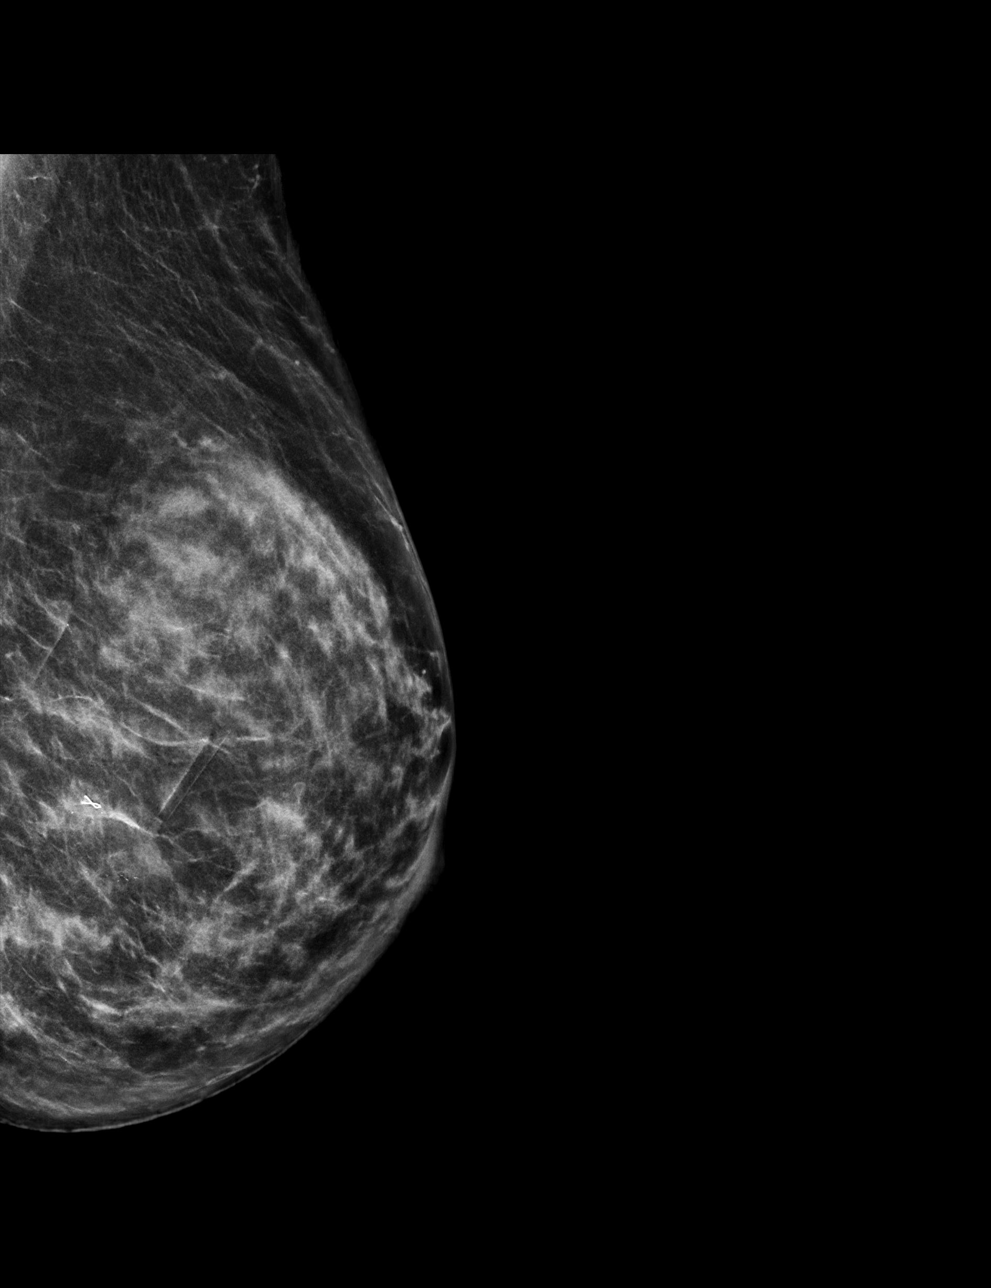

[L CC synth-2D]
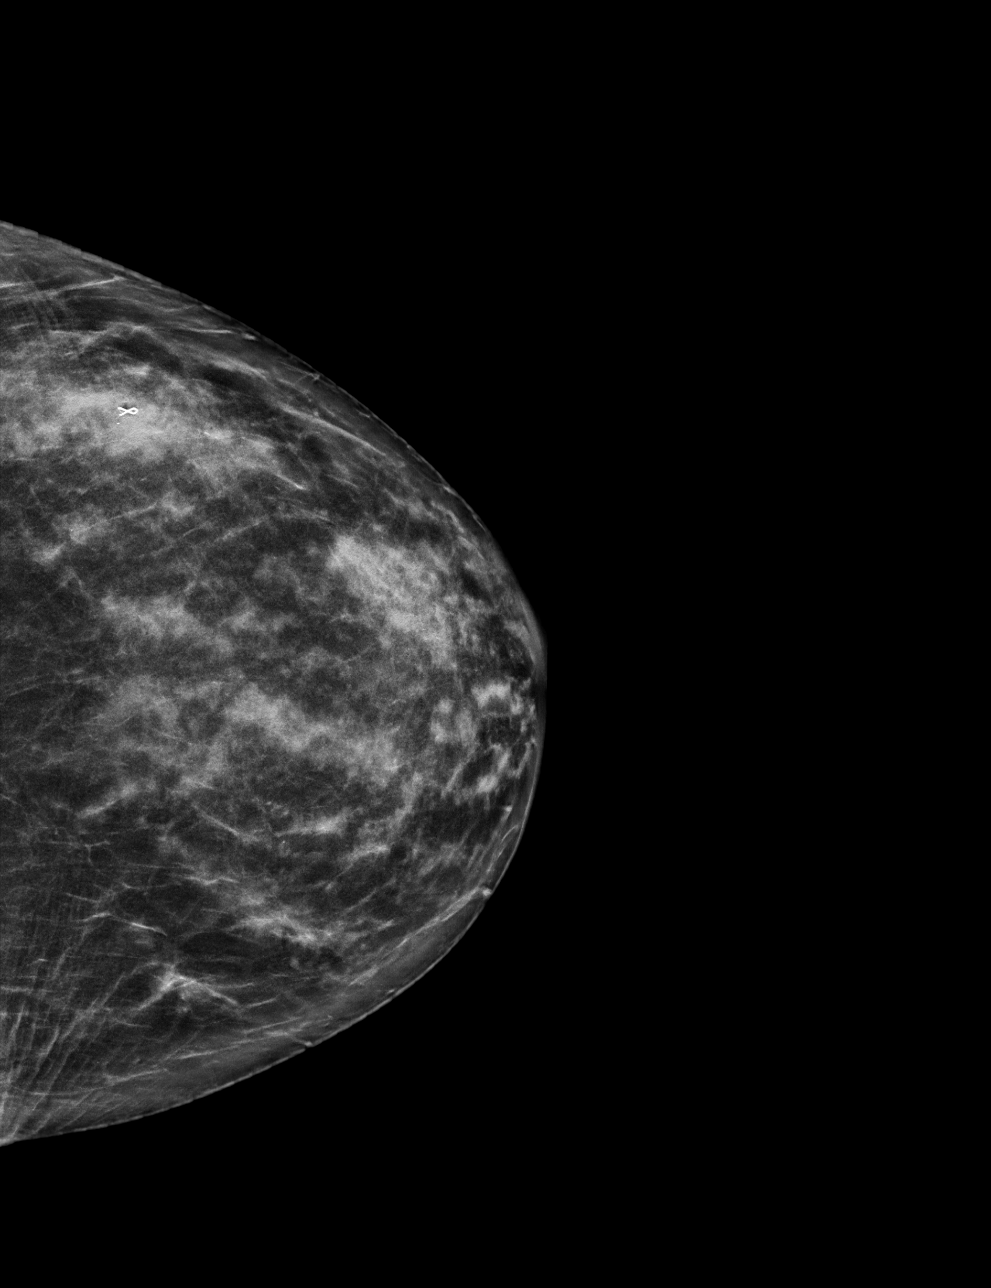

[L ML tomo · tomo slice 33/65.0]
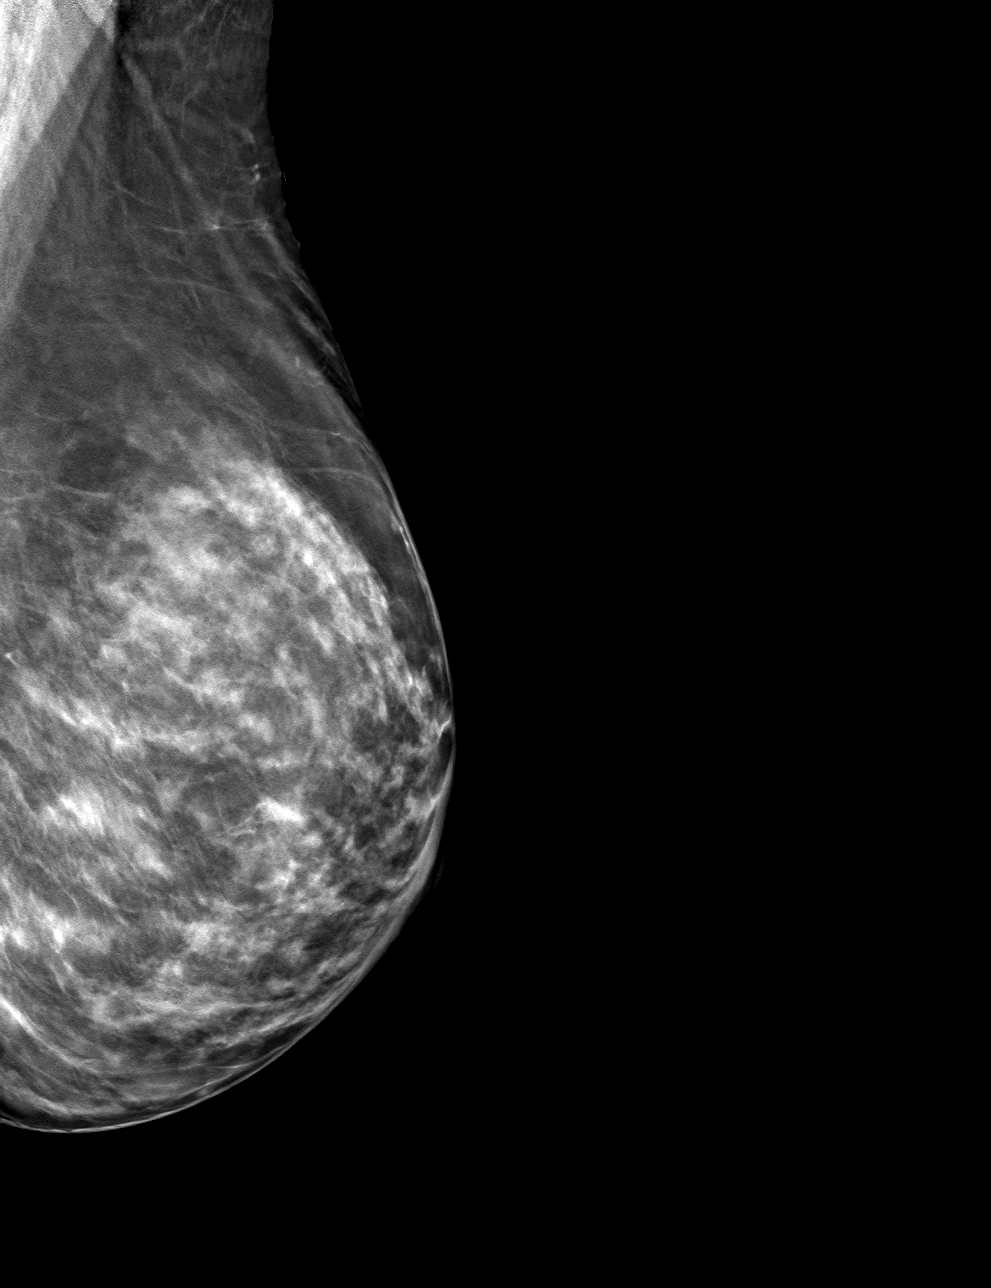

[L CC tomo · tomo slice 32/63.0]
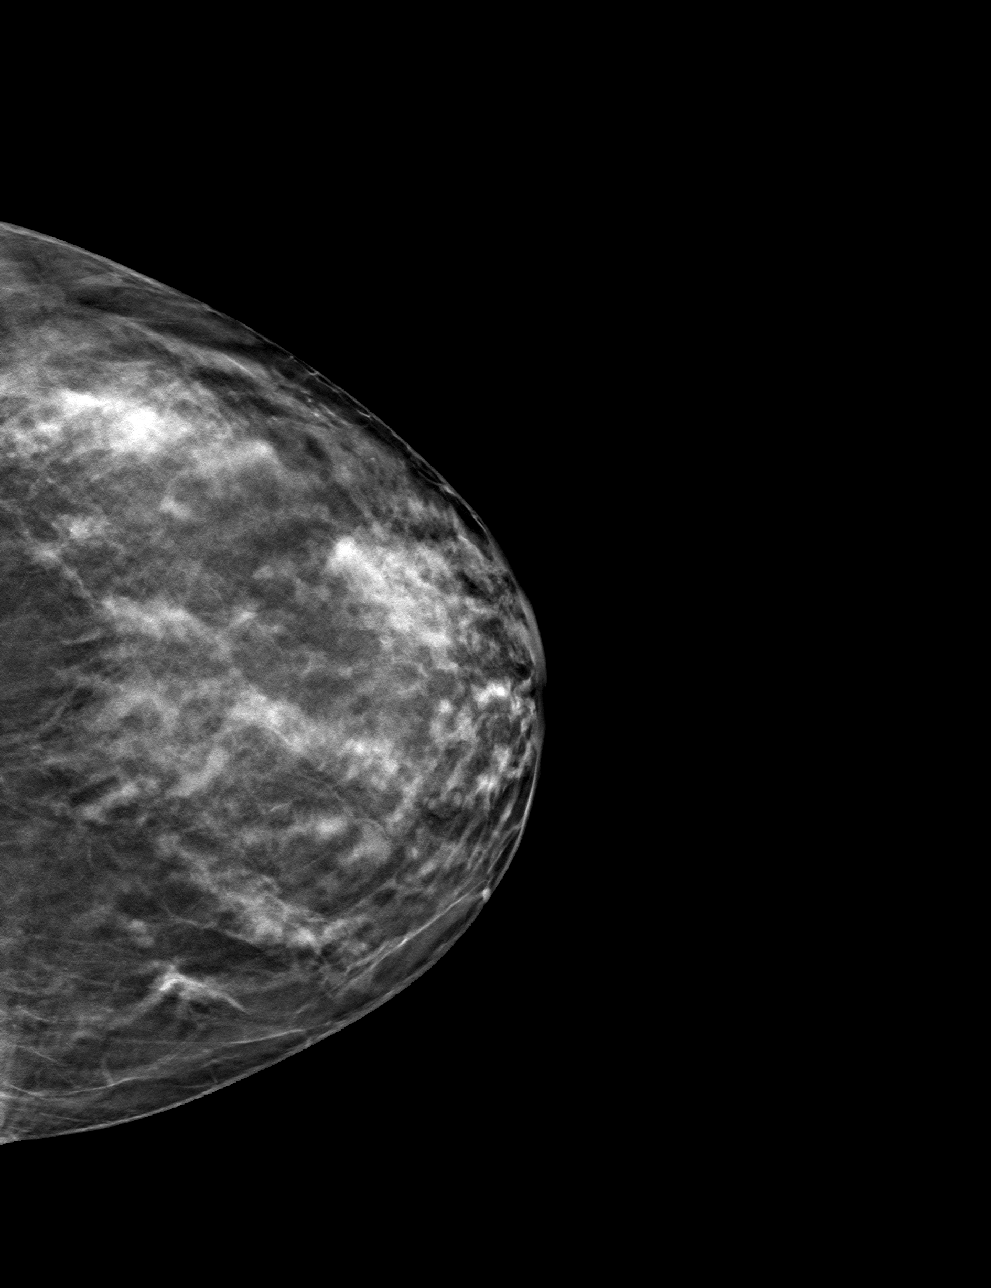

[4 of 12 positions shown; findings below may reference images not displayed]

FINDINGS: Mammographic images were obtained following ultrasound guided biopsy
of a left breast mass at 3:30 o'clock. The biopsy marking clip is in
expected position at the site of biopsy.
IMPRESSION: Appropriate positioning of the ribbon shaped biopsy marking clip at
the site of biopsy in the the left breast at 3:30 o'clock.

Final Assessment: Post Procedure Mammograms for Marker Placement

## 2020-08-25 NOTE — Telephone Encounter (Signed)
LAT - this will be a NEW start

## 2020-08-26 ENCOUNTER — Telehealth: Payer: Self-pay

## 2020-08-26 NOTE — Telephone Encounter (Signed)
Patient needs no PA Cost is $290 Okay to schedule now

## 2020-09-02 NOTE — Telephone Encounter (Signed)
Prolia ordered. Will call and schedule once we receive

## 2020-09-11 ENCOUNTER — Other Ambulatory Visit: Payer: Self-pay | Admitting: Family Medicine

## 2020-09-15 ENCOUNTER — Encounter: Payer: Self-pay | Admitting: Family Medicine

## 2020-09-15 ENCOUNTER — Other Ambulatory Visit: Payer: Self-pay

## 2020-09-15 ENCOUNTER — Ambulatory Visit (INDEPENDENT_AMBULATORY_CARE_PROVIDER_SITE_OTHER): Payer: Medicare Other | Admitting: Family Medicine

## 2020-09-15 VITALS — BP 111/71 | HR 66 | Temp 97.6°F | Ht 65.0 in | Wt 113.4 lb

## 2020-09-15 DIAGNOSIS — R0982 Postnasal drip: Secondary | ICD-10-CM

## 2020-09-15 DIAGNOSIS — M47812 Spondylosis without myelopathy or radiculopathy, cervical region: Secondary | ICD-10-CM

## 2020-09-15 DIAGNOSIS — Z170 Estrogen receptor positive status [ER+]: Secondary | ICD-10-CM

## 2020-09-15 DIAGNOSIS — M8580 Other specified disorders of bone density and structure, unspecified site: Secondary | ICD-10-CM

## 2020-09-15 DIAGNOSIS — L989 Disorder of the skin and subcutaneous tissue, unspecified: Secondary | ICD-10-CM | POA: Diagnosis not present

## 2020-09-15 DIAGNOSIS — C50212 Malignant neoplasm of upper-inner quadrant of left female breast: Secondary | ICD-10-CM | POA: Diagnosis not present

## 2020-09-15 MED ORDER — DENOSUMAB 60 MG/ML ~~LOC~~ SOSY
60.0000 mg | PREFILLED_SYRINGE | Freq: Once | SUBCUTANEOUS | Status: AC
  Administered 2020-09-15: 60 mg via SUBCUTANEOUS

## 2020-09-15 NOTE — Patient Instructions (Addendum)
Clotrimazole cream applied to affected area on cheek twice daily for 2-3 weeks or until lesion resolves  We talked about using honey for cough/ allergy  We talked about Prolia and benefits of reducing breast cancer recurrence AND improving bone health  If you decide you want Physical therapy for your neck, please let me know  Aspirin is NOT needed for you

## 2020-09-15 NOTE — Progress Notes (Signed)
Subjective: CC: Osteopenia PCP: Janora Norlander, DO RSW:NIOEV H Vanwyk is a 75 y.o. female presenting to clinic today for:  1.  Osteopenia with high risk of fracture Patient had greater than 3% risk of hip fracture.  However her DEXA overall had a -2.0 T score.  Given her history of breast cancer, it was felt appropriate to treat with Prolia and patient would like to proceed with this.  2.  Skin rash Patient think she has a ringworm on the left cheek.  She notes that it was red and raised with some central clearing but was not entirely ringlike.  She been using an over-the-counter antifungal but she is not quite sure what.  No significant itching.  No lesions elsewhere  3.  Neck pain Patient with ongoing left-sided neck pain.  She notes that it is better than previous but was still present.  No upper extremity weakness.  She has occasional numbness and tingling in random fingers  4.  Cough Patient reports an occasional tickle in her throat when she is in the choir at church.  It precipitates of cough and she wants to know what she can do about this.  Not currently treated with any antihistamines.  She does report postnasal drip.  No fevers, hemoptysis or shortness of breath   ROS: Per HPI  Allergies  Allergen Reactions  . Macrodantin   . Sulfa Antibiotics   . Wellbutrin [Bupropion Hcl]    Past Medical History:  Diagnosis Date  . Allergy   . Anxiety   . Atypical mole 11/23/2002   moderate-right lower back(WS)  . Basal cell carcinoma 02/06/2019   nod-left nare (MOHS)  . Breast cancer ([place])   . Cataract   . Depression   . History of colon polyps   . Hyperlipidemia   . Hypothyroidism   . Interstitial cystitis    History of  . Osteopenia   . Personal history of radiation therapy   . S/P lumpectomy, left breast 04/03/2019  . Skin cancer    removed from left nostril  . Vitamin D deficiency     Current Outpatient Medications:  .  aspirin EC 81 MG tablet, Take 81 mg  by mouth daily. Swallow whole., Disp: , Rfl:  .  azelastine (ASTELIN) 0.1 % nasal spray, Place 1-2 sprays into both nostrils at bedtime., Disp: 30 mL, Rfl: 12 .  Black Pepper-Turmeric (TURMERIC COMPLEX/BLACK PEPPER PO), Take by mouth., Disp: , Rfl:  .  Cholecalciferol (VITAMIN D3) 1000 UNITS CAPS, Take 2 capsules by mouth daily., Disp: , Rfl:  .  cycloSPORINE (RESTASIS) 0.05 % ophthalmic emulsion, Place 1 drop into both eyes 2 (two) times daily., Disp: , Rfl:  .  estradiol (ESTRACE) 0.1 MG/GM vaginal cream, INSERT 1 VAGINALLY AT BEDTIME, Disp: 42.5 g, Rfl: 1 .  fluticasone (FLONASE) 50 MCG/ACT nasal spray, 1 SPRAY IN EACH NOSTRIL EVERY 12 HOURS, Disp: 48 g, Rfl: 1 .  ibuprofen (ADVIL) 600 MG tablet, Take 1 tablet (600 mg total) by mouth every 8 (eight) hours as needed., Disp: 30 tablet, Rfl: 0 .  levothyroxine (SYNTHROID) 25 MCG tablet, TAKE 1 TABLET EVERY DAY BEFORE BREAKFAST, Disp: 90 tablet, Rfl: 3 .  Multiple Vitamins-Minerals (WOMENS 50+ MULTI VITAMIN/MIN PO), Take by mouth., Disp: , Rfl:  .  Omega-3 Fatty Acids (SUPER OMEGA-3 PO), Take by mouth., Disp: , Rfl:  .  Polyethylene Glycol 3350 (MIRALAX PO), Take 1 packet by mouth daily as needed., Disp: , Rfl:  .  rosuvastatin (  CRESTOR) 5 MG tablet, TAKE (1) TABLET DAILY AS DIRECTED., Disp: 90 tablet, Rfl: 3 .  Specialty Vitamins Products (ONE-A-DAY BONE STRENGTH) 500-28-100 MG-MG-UNIT TABS, Take 1 tablet by mouth 3 (three) times daily., Disp: , Rfl:  .  tamoxifen (NOLVADEX) 20 MG tablet, Take 20 mg by mouth daily., Disp: , Rfl:  .  tiZANidine (ZANAFLEX) 4 MG tablet, Take 0.5-1 tablets (2-4 mg total) by mouth every 8 (eight) hours as needed for muscle spasms., Disp: 30 tablet, Rfl: 0 Social History   Socioeconomic History  . Marital status: Married    Spouse name: Ed  . Number of children: 2  . Years of education: 16  . Highest education level: Bachelor's degree (e.g., BA, AB, BS)  Occupational History  . Occupation: Retired    Comment:  Geophysicist/field seismologist  Tobacco Use  . Smoking status: Never Smoker  . Smokeless tobacco: Never Used  Vaping Use  . Vaping Use: Never used  Substance and Sexual Activity  . Alcohol use: Yes    Alcohol/week: 7.0 standard drinks    Types: 7 Standard drinks or equivalent per week    Comment: wine nightly  . Drug use: No  . Sexual activity: Yes    Birth control/protection: Post-menopausal  Other Topics Concern  . Not on file  Social History Narrative   Lives with Ed   2 cats    2 children = neither local    Social Determinants of Health   Financial Resource Strain: Low Risk   . Difficulty of Paying Living Expenses: Not hard at all  Food Insecurity: No Food Insecurity  . Worried About Charity fundraiser in the Last Year: Never true  . Ran Out of Food in the Last Year: Never true  Transportation Needs: No Transportation Needs  . Lack of Transportation (Medical): No  . Lack of Transportation (Non-Medical): No  Physical Activity: Sufficiently Active  . Days of Exercise per Week: 5 days  . Minutes of Exercise per Session: 30 min  Stress: No Stress Concern Present  . Feeling of Stress : Only a little  Social Connections: Socially Integrated  . Frequency of Communication with Friends and Family: More than three times a week  . Frequency of Social Gatherings with Friends and Family: More than three times a week  . Attends Religious Services: More than 4 times per year  . Active Member of Clubs or Organizations: Yes  . Attends Archivist Meetings: More than 4 times per year  . Marital Status: Married  Human resources officer Violence: Not At Risk  . Fear of Current or Ex-Partner: No  . Emotionally Abused: No  . Physically Abused: No  . Sexually Abused: No   Family History  Problem Relation Age of Onset  . Heart attack Father   . Congestive Heart Failure Father   . Osteoporosis Maternal Aunt   . Uterine cancer Maternal Aunt   . Cancer Mother        possible lung - spread    . Early death Mother   . Stroke Maternal Grandmother   . Breast cancer Paternal Grandmother   . Stroke Paternal Grandfather     Objective: Office vital signs reviewed. BP 111/71   Pulse 66   Temp 97.6 F (36.4 C)   Ht 5\' 5"  (1.651 m)   Wt 113 lb 6.4 oz (51.4 kg)   SpO2 99%   BMI 18.87 kg/m   Physical Examination:  General: Awake, alert, well nourished, No acute  distress HEENT: Normal; sclera white.  Mild cobblestone appearance of the oropharynx.  No nasal discharge Cardio: regular rate and rhythm, S1S2 heard, no murmurs appreciated Pulm: clear to auscultation bilaterally, no wheezes, rhonchi or rales; normal work of breathing on room air MSK: Ambulating independently  Thoracic: Scoliotic curve noted to the right in the upper mid Thoracics  C-spine: She has full active range of motion but does have pain with side bending to the left.  No midline tenderness palpation.  No palpable bony abnormalities. Skin: Raised, 2 mm maculopapular lesion noted along the left cheek.  This is not entirely a ringlike lesion.  Make a somewhat prohibitive on exam today  Assessment/ Plan: 75 y.o. female   Osteopenia with high risk of fracture - Plan: denosumab (PROLIA) injection 60 mg  Malignant neoplasm of upper-inner quadrant of left breast in female, estrogen receptor positive (HCC)  Post-nasal drainage  Cervical spine arthritis  Skin lesion of face  Proceed with Prolia and she is got her first injection today.  Follow-up in 6 months, sooner if needed for repeat injection.  Hopefully Prolia will help her reduce risk of recurrence of breast cancer.  Will CC to Dr. Jana Hakim as Juluis Rainier  Recommended holistic/natural approach for postnasal drainage including use of honey  I suspect she has degenerative changes in the C-spine.  I offered physical therapy.  Symptoms seem to be improving on their own but she will contact me if she changes her mind  Lesion on face questionably fungal in nature.   Recommended clotrimazole apply to the face twice daily for the next 2 to 3 weeks.  If symptoms worsen or she develops lesions elsewhere she is to come back in for reevaluation  No orders of the defined types were placed in this encounter.  No orders of the defined types were placed in this encounter.    Janora Norlander, DO [place] [phone]

## 2020-09-17 NOTE — Telephone Encounter (Signed)
Prolia injection given 05/02

## 2020-10-06 ENCOUNTER — Other Ambulatory Visit: Payer: Self-pay | Admitting: *Deleted

## 2020-10-06 MED ORDER — ESTRADIOL 0.1 MG/GM VA CREA: TOPICAL_CREAM | VAGINAL | 0 refills | Status: DC

## 2020-10-14 ENCOUNTER — Other Ambulatory Visit: Payer: Self-pay | Admitting: Family Medicine

## 2020-11-25 DIAGNOSIS — H26493 Other secondary cataract, bilateral: Secondary | ICD-10-CM | POA: Diagnosis not present

## 2020-11-25 DIAGNOSIS — Z961 Presence of intraocular lens: Secondary | ICD-10-CM | POA: Diagnosis not present

## 2020-11-25 DIAGNOSIS — Z23 Encounter for immunization: Secondary | ICD-10-CM | POA: Diagnosis not present

## 2020-11-25 DIAGNOSIS — H04123 Dry eye syndrome of bilateral lacrimal glands: Secondary | ICD-10-CM | POA: Diagnosis not present

## 2020-12-03 ENCOUNTER — Ambulatory Visit (INDEPENDENT_AMBULATORY_CARE_PROVIDER_SITE_OTHER): Payer: Medicare Other

## 2020-12-03 VITALS — Ht 65.0 in | Wt 114.0 lb

## 2020-12-03 DIAGNOSIS — Z0000 Encounter for general adult medical examination without abnormal findings: Secondary | ICD-10-CM

## 2020-12-03 NOTE — Patient Instructions (Signed)
Kayla Randolph , Thank you for taking time to come for your Medicare Wellness Visit. I appreciate your ongoing commitment to your health goals. Please review the following plan we discussed and let me know if I can assist you in the future.   Screening recommendations/referrals: Colonoscopy: Done 11/29/2019 Mammogram: Done 05/13/2020 - Repeat annually Bone Density: Done 06/18/2020 - Repeat every 2 years  Recommended yearly ophthalmology/optometry visit for glaucoma screening and checkup Recommended yearly dental visit for hygiene and checkup  Vaccinations: Influenza vaccine: Done 03/04/2020 - Repeat annually  Pneumococcal vaccine: Done 08/15/2009 & 05/15/2013 Tdap vaccine: Done 06/09/2018 - Repeat in 10 years  Shingles vaccine: Done 09/27/2016 & 03/24/2017   Covid-19:Done 06/05/19, 06/28/19, & 03/19/2020  Advanced directives: Please bring a copy of your health care power of attorney and living will to the office to be added to your chart at your convenience.   Conditions/risks identified: Aim for 30 minutes of exercise or brisk walking each day, drink 6-8 glasses of water and eat lots of fruits and vegetables.   Next appointment: Follow up in one year for your annual wellness visit    Preventive Care 76 Years and Older, Female Preventive care refers to lifestyle choices and visits with your health care provider that can promote health and wellness. What does preventive care include? A yearly physical exam. This is also called an annual well check. Dental exams once or twice a year. Routine eye exams. Ask your health care provider how often you should have your eyes checked. Personal lifestyle choices, including: Daily care of your teeth and gums. Regular physical activity. Eating a healthy diet. Avoiding tobacco and drug use. Limiting alcohol use. Practicing safe sex. Taking low-dose aspirin every day. Taking vitamin and mineral supplements as recommended by your health care provider. What  happens during an annual well check? The services and screenings done by your health care provider during your annual well check will depend on your age, overall health, lifestyle risk factors, and family history of disease. Counseling  Your health care provider may ask you questions about your: Alcohol use. Tobacco use. Drug use. Emotional well-being. Home and relationship well-being. Sexual activity. Eating habits. History of falls. Memory and ability to understand (cognition). Work and work Statistician. Reproductive health. Screening  You may have the following tests or measurements: Height, weight, and BMI. Blood pressure. Lipid and cholesterol levels. These may be checked every 5 years, or more frequently if you are over 76 years old. Skin check. Lung cancer screening. You may have this screening every year starting at age 76 if you have a 30-pack-year history of smoking and currently smoke or have quit within the past 15 years. Fecal occult blood test (FOBT) of the stool. You may have this test every year starting at age 76. Flexible sigmoidoscopy or colonoscopy. You may have a sigmoidoscopy every 5 years or a colonoscopy every 10 years starting at age 76. Hepatitis C blood test. Hepatitis B blood test. Sexually transmitted disease (STD) testing. Diabetes screening. This is done by checking your blood sugar (glucose) after you have not eaten for a while (fasting). You may have this done every 1-3 years. Bone density scan. This is done to screen for osteoporosis. You may have this done starting at age 76. Mammogram. This may be done every 1-2 years. Talk to your health care provider about how often you should have regular mammograms. Talk with your health care provider about your test results, treatment options, and if necessary, the need  for more tests. Vaccines  Your health care provider may recommend certain vaccines, such as: Influenza vaccine. This is recommended every  year. Tetanus, diphtheria, and acellular pertussis (Tdap, Td) vaccine. You may need a Td booster every 10 years. Zoster vaccine. You may need this after age 76. Pneumococcal 13-valent conjugate (PCV13) vaccine. One dose is recommended after age 76. Pneumococcal polysaccharide (PPSV23) vaccine. One dose is recommended after age 76. Talk to your health care provider about which screenings and vaccines you need and how often you need them. This information is not intended to replace advice given to you by your health care provider. Make sure you discuss any questions you have with your health care provider. Document Released: 05/30/2015 Document Revised: 01/21/2016 Document Reviewed: 03/04/2015 Elsevier Interactive Patient Education  2017 [place] Prevention in the Home Falls can cause injuries. They can happen to people of all ages. There are many things you can do to make your home safe and to help prevent falls. What can I do on the outside of my home? Regularly fix the edges of walkways and driveways and fix any cracks. Remove anything that might make you trip as you walk through a door, such as a raised step or threshold. Trim any bushes or trees on the path to your home. Use bright outdoor lighting. Clear any walking paths of anything that might make someone trip, such as rocks or tools. Regularly check to see if handrails are loose or broken. Make sure that both sides of any steps have handrails. Any raised decks and porches should have guardrails on the edges. Have any leaves, snow, or ice cleared regularly. Use sand or salt on walking paths during winter. Clean up any spills in your garage right away. This includes oil or grease spills. What can I do in the bathroom? Use night lights. Install grab bars by the toilet and in the tub and shower. Do not use towel bars as grab bars. Use non-skid mats or decals in the tub or shower. If you need to sit down in the shower, use a  plastic, non-slip stool. Keep the floor dry. Clean up any water that spills on the floor as soon as it happens. Remove soap buildup in the tub or shower regularly. Attach bath mats securely with double-sided non-slip rug tape. Do not have throw rugs and other things on the floor that can make you trip. What can I do in the bedroom? Use night lights. Make sure that you have a light by your bed that is easy to reach. Do not use any sheets or blankets that are too big for your bed. They should not hang down onto the floor. Have a firm chair that has side arms. You can use this for support while you get dressed. Do not have throw rugs and other things on the floor that can make you trip. What can I do in the kitchen? Clean up any spills right away. Avoid walking on wet floors. Keep items that you use a lot in easy-to-reach places. If you need to reach something above you, use a strong step stool that has a grab bar. Keep electrical cords out of the way. Do not use floor polish or wax that makes floors slippery. If you must use wax, use non-skid floor wax. Do not have throw rugs and other things on the floor that can make you trip. What can I do with my stairs? Do not leave any items on the stairs.  Make sure that there are handrails on both sides of the stairs and use them. Fix handrails that are broken or loose. Make sure that handrails are as long as the stairways. Check any carpeting to make sure that it is firmly attached to the stairs. Fix any carpet that is loose or worn. Avoid having throw rugs at the top or bottom of the stairs. If you do have throw rugs, attach them to the floor with carpet tape. Make sure that you have a light switch at the top of the stairs and the bottom of the stairs. If you do not have them, ask someone to add them for you. What else can I do to help prevent falls? Wear shoes that: Do not have high heels. Have rubber bottoms. Are comfortable and fit you  well. Are closed at the toe. Do not wear sandals. If you use a stepladder: Make sure that it is fully opened. Do not climb a closed stepladder. Make sure that both sides of the stepladder are locked into place. Ask someone to hold it for you, if possible. Clearly mark and make sure that you can see: Any grab bars or handrails. First and last steps. Where the edge of each step is. Use tools that help you move around (mobility aids) if they are needed. These include: Canes. Walkers. Scooters. Crutches. Turn on the lights when you go into a dark area. Replace any light bulbs as soon as they burn out. Set up your furniture so you have a clear path. Avoid moving your furniture around. If any of your floors are uneven, fix them. If there are any pets around you, be aware of where they are. Review your medicines with your doctor. Some medicines can make you feel dizzy. This can increase your chance of falling. Ask your doctor what other things that you can do to help prevent falls. This information is not intended to replace advice given to you by your health care provider. Make sure you discuss any questions you have with your health care provider. Document Released: 02/27/2009 Document Revised: 10/09/2015 Document Reviewed: 06/07/2014 Elsevier Interactive Patient Education  2017 [employer].

## 2020-12-03 NOTE — Progress Notes (Signed)
Subjective:   Kayla Randolph is a 75 y.o. female who presents for Medicare Annual (Subsequent) preventive examination  Virtual Visit via Telephone Note  I connected with  Kayla Randolph on 12/03/20 at  9:45 AM EDT by telephone and verified that I am speaking with the correct person using two identifiers.  Location: Patient: Home Provider: [facility] Persons participating in the virtual visit: patient/Nurse Health Advisor   I discussed the limitations, risks, security and privacy concerns of performing an evaluation and management service by telephone and the availability of in person appointments. The patient expressed understanding and agreed to proceed.  Interactive audio and video telecommunications were attempted between this nurse and patient, however failed, due to patient having technical difficulties OR patient did not have access to video capability.  We continued and completed visit with audio only.  Some vital signs may be absent or patient reported.   [name] E [name], LPN   Review of Systems     Cardiac Risk Factors include: advanced age (>81men, >60 women);dyslipidemia;sedentary lifestyle;Other (see comment), Risk factor comments: underweight     Objective:    Today's Vitals   12/03/20 0917  Weight: 114 lb (51.7 kg)  Height: 5\' 5"  (1.651 m)   Body mass index is 18.97 kg/m.  Advanced Directives 12/03/2020 12/03/2019 10/03/2019 04/25/2019 04/03/2019 03/28/2019 03/21/2019  Does Patient Have a Medical Advance Directive? Yes Yes Yes No;Yes Yes Yes Yes  Type of Paramedic of [place];Living will [place];Living will - Living will;Healthcare Power of [place];Living will [place];Living will [place]  Does patient want to make changes to medical advance directive? - No - Patient declined Yes (MAU/Ambulatory/Procedural Areas - Information given) No - Patient declined No -  Patient declined No - Patient declined No - Patient declined  Copy of [place] in Chart? No - copy requested No - copy requested - No - copy requested No - copy requested - No - copy requested  Would patient like information on creating a medical advance directive? - - - No - Patient declined - - -    Current Medications (verified) Outpatient Encounter Medications as of 12/03/2020  Medication Sig   azelastine (ASTELIN) 0.1 % nasal spray Place 1-2 sprays into both nostrils at bedtime.   Black Pepper-Turmeric (TURMERIC COMPLEX/BLACK PEPPER PO) Take by mouth.   Cholecalciferol (VITAMIN D3) 1000 UNITS CAPS Take 2 capsules by mouth daily.   cycloSPORINE (RESTASIS) 0.05 % ophthalmic emulsion Place 1 drop into both eyes 2 (two) times daily.   estradiol (ESTRACE) 0.1 MG/GM vaginal cream INSERT 1 VAGINALLY AT BEDTIME   fluticasone (FLONASE) 50 MCG/ACT nasal spray 1 SPRAY IN EACH NOSTRIL EVERY 12 HOURS   levothyroxine (SYNTHROID) 25 MCG tablet TAKE 1 TABLET EVERY DAY BEFORE BREAKFAST   Multiple Vitamins-Minerals (WOMENS 50+ MULTI VITAMIN/MIN PO) Take by mouth.   Omega-3 Fatty Acids (SUPER OMEGA-3 PO) Take by mouth.   Polyethylene Glycol 3350 (MIRALAX PO) Take 1 packet by mouth daily as needed.   rosuvastatin (CRESTOR) 5 MG tablet TAKE (1) TABLET DAILY AS DIRECTED.   Specialty Vitamins Products (ONE-A-DAY BONE STRENGTH) 500-28-100 MG-MG-UNIT TABS Take 1 tablet by mouth 3 (three) times daily.   tamoxifen (NOLVADEX) 20 MG tablet Take 20 mg by mouth daily.   tiZANidine (ZANAFLEX) 4 MG tablet Take 0.5-1 tablets (2-4 mg total) by mouth every 8 (eight) hours as needed for muscle spasms. (Patient not taking: Reported  on 09/15/2020)   [DISCONTINUED] aspirin EC 81 MG tablet Take 81 mg by mouth daily. Swallow whole. (Patient not taking: Reported on 09/15/2020)   [DISCONTINUED] ibuprofen (ADVIL) 600 MG tablet Take 1 tablet (600 mg total) by mouth every 8 (eight) hours as needed.   No  facility-administered encounter medications on file as of 12/03/2020.    Allergies (verified) Macrodantin, Sulfa antibiotics, and Wellbutrin [bupropion hcl]   History: Past Medical History:  Diagnosis Date   Allergy    Anxiety    Atypical mole 11/23/2002   moderate-right lower back(WS)   Basal cell carcinoma 02/06/2019   nod-left nare (MOHS)   Breast cancer ([place])    Cataract    Depression    History of colon polyps    Hyperlipidemia    Hypothyroidism    Interstitial cystitis    History of   Osteopenia    Personal history of radiation therapy    S/P lumpectomy, left breast 04/03/2019   Skin cancer    removed from left nostril   Vitamin D deficiency    Past Surgical History:  Procedure Laterality Date   BREAST BIOPSY Left 03/13/2019   BREAST LUMPECTOMY Left 04/03/2019   BREAST LUMPECTOMY WITH RADIOACTIVE SEED AND SENTINEL LYMPH NODE BIOPSY Left 04/03/2019   Procedure: LEFT BREAST LUMPECTOMY WITH RADIOACTIVE SEED AND SENTINEL LYMPH NODE MAPPING;  Surgeon: Erroll Luna, MD;  Location: [facility];  Service: General;  Laterality: Left;   CESAREAN SECTION     EYE SURGERY Right    cataract   EYE SURGERY Left    cataract   TONSILLECTOMY     Family History  Problem Relation Age of Onset   Heart attack Father    Congestive Heart Failure Father    Osteoporosis Maternal Aunt    Uterine cancer Maternal Aunt    Cancer Mother        possible lung - spread    Early death Mother    Stroke Maternal Grandmother    Breast cancer Paternal Grandmother    Stroke Paternal Grandfather    Social History   Socioeconomic History   Marital status: Married    Spouse name: Ed   Number of children: 2   Years of education: 16   Highest education level: Bachelor's degree (e.g., BA, AB, BS)  Occupational History   Occupation: Retired    Comment: Geophysicist/field seismologist  Tobacco Use   Smoking status: Never   Smokeless tobacco: Never  Vaping Use   Vaping Use: Never  used  Substance and Sexual Activity   Alcohol use: Yes    Alcohol/week: 7.0 standard drinks    Types: 7 Standard drinks or equivalent per week    Comment: wine nightly   Drug use: No   Sexual activity: Yes    Birth control/protection: Post-menopausal  Other Topics Concern   Not on file  Social History Narrative   Lives with Ed   2 cats    2 children = neither local    Social Determinants of Health   Financial Resource Strain: Low Risk    Difficulty of Paying Living Expenses: Not hard at all  Food Insecurity: No Food Insecurity   Worried About Charity fundraiser in the Last Year: Never true   Ran Out of Food in the Last Year: Never true  Transportation Needs: No Transportation Needs   Lack of Transportation (Medical): No   Lack of Transportation (Non-Medical): No  Physical Activity: Insufficiently Active   Days of  Exercise per Week: 3 days   Minutes of Exercise per Session: 30 min  Stress: No Stress Concern Present   Feeling of Stress : Only a little  Social Connections: Engineer, building services of Communication with Friends and Family: More than three times a week   Frequency of Social Gatherings with Friends and Family: Twice a week   Attends Religious Services: More than 4 times per year   Active Member of [employer] or Organizations: Yes   Attends Archivist Meetings: 1 to 4 times per year   Marital Status: Married    Tobacco Counseling Counseling given: Not Answered   Clinical Intake:  Pre-visit preparation completed: Yes  Pain : No/denies pain     BMI - recorded: 18.97 Nutritional Status: BMI <19  Underweight Nutritional Risks: None Diabetes: No  How often do you need to have someone help you when you read instructions, pamphlets, or other written materials from your doctor or pharmacy?: 1 - Never  Diabetic? No  Interpreter Needed?: No  Information entered by :: Kemari Mares, LPN   Activities of Daily Living In your present state of  health, do you have any difficulty performing the following activities: 12/03/2020  Hearing? N  Vision? N  Difficulty concentrating or making decisions? N  Walking or climbing stairs? N  Dressing or bathing? N  Doing errands, shopping? N  Preparing Food and eating ? N  Using the Toilet? N  In the past six months, have you accidently leaked urine? N  Do you have problems with loss of bowel control? N  Managing your Medications? N  Managing your Finances? N  Housekeeping or managing your Housekeeping? N  Some recent data might be hidden    Patient Care Team: Janora Norlander, DO as PCP - General (Family Medicine) Clarene Essex, MD as Consulting Physician (Gastroenterology) Lavonna Monarch, MD as Consulting Physician (Dermatology) Mauro Kaufmann, RN as Oncology Nurse Navigator Rockwell Germany, RN as Oncology Nurse Navigator Magrinat, Virgie Dad, MD as Consulting Physician (Oncology) Rutherford Guys, MD as Consulting Physician (Ophthalmology) Kyung Rudd, MD as Consulting Physician (Radiation Oncology) Erroll Luna, MD as Consulting Physician (General Surgery)  Indicate any recent Medical Services you may have received from other than [facility] providers in the past year (date may be approximate).     Assessment:   This is a routine wellness examination for [place].  Hearing/Vision screen Hearing Screening - Comments:: Denies hearing difficulties  Vision Screening - Comments:: Wears eyeglasses - up to date with annual eye exam with Dr Gershon Crane  Dietary issues and exercise activities discussed: Current Exercise Habits: Home exercise routine, Type of exercise: walking, Time (Minutes): 30, Frequency (Times/Week): 3, Weekly Exercise (Minutes/Week): 90, Intensity: Mild, Exercise limited by: None identified   Goals Addressed             This Visit's Progress    DIET - INCREASE WATER INTAKE   On track    Try to drink 6-8 glasses of water daily     Exercise 3x per week (30 min per  time)   Not on track      Depression Screen [facility] 2/9 Scores 12/03/2020 09/15/2020 05/21/2020 01/08/2020 12/06/2019 12/03/2019 09/07/2019  PHQ - 2 Score 0 0 0 0 0 0 0  PHQ- 9 Score - - 0 0 - - -    Fall Risk Fall Risk  12/03/2020 09/15/2020 05/21/2020 01/08/2020 12/06/2019  Falls in the past year? 0 0 0 0 0  Number falls  in past yr: 0 - - - -  Injury with Fall? 0 - - - -  Risk Factor Category  - - - - -  Risk for fall due to : Impaired vision;Medication side effect - - - -  Follow up Falls prevention discussed - - - -    FALL RISK PREVENTION PERTAINING TO THE HOME:  Any stairs in or around the home? Yes  If so, are there any without handrails? No  Home free of loose throw rugs in walkways, pet beds, electrical cords, etc? Yes  Adequate lighting in your home to reduce risk of falls? Yes   ASSISTIVE DEVICES UTILIZED TO PREVENT FALLS:  Life alert? No  Use of a cane, walker or w/c? No  Grab bars in the bathroom? No  Shower chair or bench in shower? Yes  Elevated toilet seat or a handicapped toilet? Yes   TIMED UP AND GO:  Was the test performed? No . Telephonic visit  Cognitive Function: MMSE - Mini Mental State Exam 11/15/2017 10/07/2016  Orientation to time 5 5  Orientation to Place 5 5  Registration 3 3  Attention/ Calculation 5 5  Recall 3 2  Language- name 2 objects 2 2  Language- repeat 1 1  Language- follow 3 step command 3 3  Language- read & follow direction 1 1  Write a sentence 1 1  Copy design 1 1  Total score 30 29     6CIT Screen 12/03/2019 12/01/2018  What Year? 0 points 0 points  What month? 0 points 0 points  What time? 0 points 0 points  Count back from 20 0 points 0 points  Months in reverse 0 points 0 points  Repeat phrase 0 points 0 points  Total Score 0 0    Immunizations Immunization History  Administered Date(s) Administered   Fluad Quad(high Dose 65+) 03/01/2019, 03/04/2020   Influenza Whole 02/14/2010   Influenza, High Dose Seasonal PF 03/18/2016,  02/17/2017, 03/07/2018   Influenza,inj,Quad PF,6+ Mos 04/03/2013, 03/14/2014, 03/28/2015   PFIZER(Purple Top)SARS-COV-2 Vaccination 06/05/2019, 06/28/2019, 03/19/2020   Pneumococcal Conjugate-13 05/15/2013   Pneumococcal Polysaccharide-23 08/15/2009   Td 06/09/2018   Tdap 05/18/2007   Zoster Recombinat (Shingrix) 10/07/2016, 03/24/2017   Zoster, Live 06/09/2006    TDAP status: Up to date  Flu Vaccine status: Up to date  Pneumococcal vaccine status: Up to date  Covid-19 vaccine status: Completed vaccines  Qualifies for Shingles Vaccine? Yes   Zostavax completed Yes   Shingrix Completed?: Yes  Screening Tests Health Maintenance  Topic Date Due   COVID-19 Vaccine (4 - Booster for Pfizer series) 06/19/2020   INFLUENZA VACCINE  12/15/2020   PAP SMEAR-Modifier  09/06/2021   DEXA SCAN  06/18/2022   TETANUS/TDAP  06/09/2028   Hepatitis C Screening  Completed   PNA vac Low Risk Adult  Completed   Zoster Vaccines- Shingrix  Completed   HPV VACCINES  Aged Out    Health Maintenance  Health Maintenance Due  Topic Date Due   COVID-19 Vaccine (4 - Booster for [place] series) 06/19/2020    Colorectal cancer screening: Type of screening: Colonoscopy. Completed 11/29/2019. Repeat every 10 years  Mammogram status: Completed 05/13/2020. Repeat every year  Bone Density status: Completed 06/18/2020. Results reflect: Bone density results: OSTEOPENIA. Repeat every 2 years.  Lung Cancer Screening: (Low Dose CT Chest recommended if Age 83-80 years, 30 pack-year currently smoking OR have quit w/in 15years.) does not qualify.   Additional Screening:  Hepatitis C Screening: does  qualify; Completed 03/01/2019  Vision Screening: Recommended annual ophthalmology exams for early detection of glaucoma and other disorders of the eye. Is the patient up to date with their annual eye exam?  Yes  Who is the provider or what is the name of the office in which the patient attends annual eye exams?  [facility] If pt is not established with a provider, would they like to be referred to a provider to establish care? No .   Dental Screening: Recommended annual dental exams for proper oral hygiene  Community Resource Referral / Chronic Care Management: CRR required this visit?  No   CCM required this visit?  No      Plan:     I have personally reviewed and noted the following in the patient's chart:   Medical and social history Use of alcohol, tobacco or illicit drugs  Current medications and supplements including opioid prescriptions.  Functional ability and status Nutritional status Physical activity Advanced directives List of other physicians Hospitalizations, surgeries, and ER visits in previous 12 months Vitals Screenings to include cognitive, depression, and falls Referrals and appointments  In addition, I have reviewed and discussed with patient certain preventive protocols, quality metrics, and best practice recommendations. A written personalized care plan for preventive services as well as general preventive health recommendations were provided to patient.     Sandrea Hammond, LPN   [id number]/[id number]   Nurse Notes: None

## 2020-12-08 ENCOUNTER — Other Ambulatory Visit: Payer: Self-pay | Admitting: Family Medicine

## 2020-12-09 ENCOUNTER — Other Ambulatory Visit: Payer: Self-pay | Admitting: Family Medicine

## 2020-12-10 DIAGNOSIS — H26492 Other secondary cataract, left eye: Secondary | ICD-10-CM | POA: Diagnosis not present

## 2020-12-24 DIAGNOSIS — H26491 Other secondary cataract, right eye: Secondary | ICD-10-CM | POA: Diagnosis not present

## 2020-12-29 ENCOUNTER — Other Ambulatory Visit: Payer: Self-pay | Admitting: Oncology

## 2021-01-10 ENCOUNTER — Other Ambulatory Visit: Payer: Self-pay | Admitting: Family Medicine

## 2021-01-30 ENCOUNTER — Encounter: Payer: Self-pay | Admitting: Family Medicine

## 2021-02-02 ENCOUNTER — Other Ambulatory Visit: Payer: Self-pay | Admitting: Family Medicine

## 2021-02-02 MED ORDER — ESCITALOPRAM OXALATE 10 MG PO TABS
10.0000 mg | ORAL_TABLET | Freq: Every day | ORAL | 3 refills | Status: DC
Start: 2021-02-02 — End: 2022-02-19

## 2021-02-20 ENCOUNTER — Other Ambulatory Visit: Payer: Self-pay | Admitting: Oncology

## 2021-02-20 DIAGNOSIS — Z1231 Encounter for screening mammogram for malignant neoplasm of breast: Secondary | ICD-10-CM

## 2021-02-23 ENCOUNTER — Ambulatory Visit (INDEPENDENT_AMBULATORY_CARE_PROVIDER_SITE_OTHER): Payer: Medicare Other

## 2021-02-23 ENCOUNTER — Other Ambulatory Visit: Payer: Self-pay

## 2021-02-23 DIAGNOSIS — Z23 Encounter for immunization: Secondary | ICD-10-CM | POA: Diagnosis not present

## 2021-03-09 ENCOUNTER — Other Ambulatory Visit: Payer: Self-pay | Admitting: Family Medicine

## 2021-03-10 ENCOUNTER — Ambulatory Visit (INDEPENDENT_AMBULATORY_CARE_PROVIDER_SITE_OTHER): Payer: Medicare Other | Admitting: Dermatology

## 2021-03-10 ENCOUNTER — Other Ambulatory Visit: Payer: Self-pay

## 2021-03-10 DIAGNOSIS — L918 Other hypertrophic disorders of the skin: Secondary | ICD-10-CM | POA: Diagnosis not present

## 2021-03-10 DIAGNOSIS — Z1283 Encounter for screening for malignant neoplasm of skin: Secondary | ICD-10-CM | POA: Diagnosis not present

## 2021-03-10 DIAGNOSIS — Z85828 Personal history of other malignant neoplasm of skin: Secondary | ICD-10-CM | POA: Diagnosis not present

## 2021-03-10 DIAGNOSIS — L821 Other seborrheic keratosis: Secondary | ICD-10-CM

## 2021-03-10 DIAGNOSIS — D1801 Hemangioma of skin and subcutaneous tissue: Secondary | ICD-10-CM | POA: Diagnosis not present

## 2021-03-18 ENCOUNTER — Other Ambulatory Visit: Payer: Self-pay

## 2021-03-18 ENCOUNTER — Ambulatory Visit (INDEPENDENT_AMBULATORY_CARE_PROVIDER_SITE_OTHER): Payer: Medicare Other | Admitting: *Deleted

## 2021-03-18 DIAGNOSIS — M8588 Other specified disorders of bone density and structure, other site: Secondary | ICD-10-CM | POA: Diagnosis not present

## 2021-03-18 DIAGNOSIS — M8580 Other specified disorders of bone density and structure, unspecified site: Secondary | ICD-10-CM

## 2021-03-18 MED ORDER — DENOSUMAB 60 MG/ML ~~LOC~~ SOSY
60.0000 mg | PREFILLED_SYRINGE | Freq: Once | SUBCUTANEOUS | Status: AC
  Administered 2021-03-18: 60 mg via SUBCUTANEOUS

## 2021-03-18 NOTE — Progress Notes (Signed)
Prolia injection tolerated well.

## 2021-03-24 ENCOUNTER — Other Ambulatory Visit: Payer: Self-pay

## 2021-03-24 ENCOUNTER — Encounter: Payer: Self-pay | Admitting: Family Medicine

## 2021-03-24 ENCOUNTER — Ambulatory Visit (INDEPENDENT_AMBULATORY_CARE_PROVIDER_SITE_OTHER): Payer: Medicare Other | Admitting: Family Medicine

## 2021-03-24 VITALS — BP 131/65 | HR 64 | Temp 98.0°F | Ht 65.0 in | Wt 114.0 lb

## 2021-03-24 DIAGNOSIS — M170 Bilateral primary osteoarthritis of knee: Secondary | ICD-10-CM | POA: Diagnosis not present

## 2021-03-24 DIAGNOSIS — R058 Other specified cough: Secondary | ICD-10-CM

## 2021-03-24 DIAGNOSIS — E034 Atrophy of thyroid (acquired): Secondary | ICD-10-CM | POA: Diagnosis not present

## 2021-03-24 MED ORDER — ESTRADIOL 0.1 MG/GM VA CREA: TOPICAL_CREAM | VAGINAL | 2 refills | Status: DC

## 2021-03-24 MED ORDER — BENZONATATE 100 MG PO CAPS
100.0000 mg | ORAL_CAPSULE | Freq: Three times a day (TID) | ORAL | 0 refills | Status: DC | PRN
Start: 2021-03-24 — End: 2021-04-20

## 2021-03-24 NOTE — Progress Notes (Signed)
Subjective: CC: Hypothyroidism PCP: Janora Norlander, DO TML:YYTKP H [name] is a 75 y.o. female presenting to clinic today for:  1.  Hypothyroidism Patient is compliant with her Synthroid 25 mcg daily.  Denies any difficulty swallowing, tremor or heart palpitations  2.  Cough Patient continues to have an intermittent cough.  She reports that this has only occurred maybe twice over the last several weeks but the cough occurs in spells.  No reports of hemoptysis, unplanned weight loss, night sweats, change in exercise tolerance or breathing patterns.  3.  Osteoarthritis Patient reports bilateral knee arthritis that seems to be exacerbated by crossing her legs.  She uses Voltaren gel topically and she does find that to be somewhat helpful.  Does not wish to have any corticosteroid injections at this time   ROS: Per HPI  Allergies  Allergen Reactions   Macrodantin    Sulfa Antibiotics    Wellbutrin [Bupropion Hcl]    Past Medical History:  Diagnosis Date   Allergy    Anxiety    Atypical mole 11/23/2002   moderate-right lower back(WS)   Basal cell carcinoma 02/06/2019   nod-left nare ([facility])   Breast cancer ([place])    Cataract    Depression    History of colon polyps    Hyperlipidemia    Hypothyroidism    Interstitial cystitis    History of   Osteopenia    Personal history of radiation therapy    S/P lumpectomy, left breast 04/03/2019   Skin cancer    removed from left nostril   Vitamin D deficiency     Current Outpatient Medications:    Black Pepper-Turmeric (TURMERIC COMPLEX/BLACK PEPPER PO), Take by mouth., Disp: , Rfl:    Cholecalciferol (VITAMIN D3) 1000 UNITS CAPS, Take 2 capsules by mouth daily., Disp: , Rfl:    cycloSPORINE (RESTASIS) 0.05 % ophthalmic emulsion, Place 1 drop into both eyes 2 (two) times daily., Disp: , Rfl:    escitalopram (LEXAPRO) 10 MG tablet, Take 1 tablet (10 mg total) by mouth daily., Disp: 90 tablet, Rfl: 3   estradiol (ESTRACE) 0.1  MG/GM vaginal cream, INSERT 1 VAGINALLY AT BEDTIME, Disp: 127.5 g, Rfl: 0   fluticasone (FLONASE) 50 MCG/ACT nasal spray, 1 SPRAY IN EACH NOSTRIL EVERY 12 HOURS, Disp: 48 g, Rfl: 0   levothyroxine (SYNTHROID) 25 MCG tablet, TAKE 1 TABLET EVERY DAY BEFORE BREAKFAST, Disp: 90 tablet, Rfl: 3   Multiple Vitamins-Minerals (WOMENS 50+ MULTI VITAMIN/MIN PO), Take by mouth., Disp: , Rfl:    Omega-3 Fatty Acids (SUPER OMEGA-3 PO), Take by mouth., Disp: , Rfl:    Polyethylene Glycol 3350 (MIRALAX PO), Take 1 packet by mouth daily as needed., Disp: , Rfl:    rosuvastatin (CRESTOR) 5 MG tablet, TAKE (1) TABLET DAILY AS DIRECTED., Disp: 90 tablet, Rfl: 0   Specialty Vitamins Products (ONE-A-DAY BONE STRENGTH) 500-28-100 MG-MG-UNIT TABS, Take 1 tablet by mouth 3 (three) times daily., Disp: , Rfl:    tamoxifen (NOLVADEX) 20 MG tablet, TAKE 1 TABLET DAILY, Disp: 90 tablet, Rfl: 12   azelastine (ASTELIN) 0.1 % nasal spray, Place 1-2 sprays into both nostrils at bedtime. (Patient not taking: Reported on 03/24/2021), Disp: 30 mL, Rfl: 12 Social History   Socioeconomic History   Marital status: Married    Spouse name: Ed   Number of children: 2   Years of education: 16   Highest education level: Bachelor's degree (e.g., BA, AB, BS)  Occupational History   Occupation: Retired  Comment: Teaching and tutoring  Tobacco Use   Smoking status: Never   Smokeless tobacco: Never  Vaping Use   Vaping Use: Never used  Substance and Sexual Activity   Alcohol use: Yes    Alcohol/week: 7.0 standard drinks    Types: 7 Standard drinks or equivalent per week    Comment: wine nightly   Drug use: No   Sexual activity: Yes    Birth control/protection: Post-menopausal  Other Topics Concern   Not on file  Social History Narrative   Lives with Ed   2 cats    2 children = neither local    Social Determinants of Health   Financial Resource Strain: Low Risk    Difficulty of Paying Living Expenses: Not hard at all   Food Insecurity: No Food Insecurity   Worried About Charity fundraiser in the Last Year: Never true   Ran Out of Food in the Last Year: Never true  Transportation Needs: No Transportation Needs   Lack of Transportation (Medical): No   Lack of Transportation (Non-Medical): No  Physical Activity: Insufficiently Active   Days of Exercise per Week: 3 days   Minutes of Exercise per Session: 30 min  Stress: No Stress Concern Present   Feeling of Stress : Only a little  Social Connections: Engineer, building services of Communication with Friends and Family: More than three times a week   Frequency of Social Gatherings with Friends and Family: Twice a week   Attends Religious Services: More than 4 times per year   Active Member of [employer] or Organizations: Yes   Attends Archivist Meetings: 1 to 4 times per year   Marital Status: Married  Human resources officer Violence: Not on file   Family History  Problem Relation Age of Onset   Heart attack Father    Congestive Heart Failure Father    Osteoporosis Maternal Aunt    Uterine cancer Maternal Aunt    Cancer Mother        possible lung - spread    Early death Mother    Stroke Maternal Grandmother    Breast cancer Paternal Grandmother    Stroke Paternal Grandfather     Objective: Office vital signs reviewed. BP 131/65   Pulse 64   Temp 98 F (36.7 C) (Temporal)   Ht 5\' 5"  (1.651 m)   Wt 114 lb (51.7 kg)   SpO2 99%   BMI 18.97 kg/m   Physical Examination:  General: Awake, alert, thin, well-appearing female, No acute distress HEENT: No exophthalmos.  No goiter. Cardio: regular rate and rhythm, S1S2 heard, no murmurs appreciated Pulm: clear to auscultation bilaterally, no wheezes, rhonchi or rales; normal work of breathing on room air  Assessment/ Plan: 75 y.o. female   Hypothyroidism due to acquired atrophy of thyroid - Plan: TSH, T4, free  Other cough - Plan: benzonatate (TESSALON PERLES) 100 MG  capsule  Primary osteoarthritis of both knees  Check TSH, free T4 with her labs to be obtained by her specialist in December.  Handwritten prescription was given and future orders and [facility] database placed  Uncertain etiology of her intermittent cough without concerning features.  Tessalon Perles have been provided for as needed use.  I considered etiologies like GERD but given lack of GERD symptoms and lack of cough being precipitated by lying down I think this to be less likely.  We could certainly consider imaging of her chest if symptoms persist.  She demonstrates no  infectious symptoms today.  Not on any ACEs or ARB's and lung exam was totally unremarkable today  Offered corticosteroid injection of the knee if she is having sustained or exacerbated pain.  For now I think it is fine to continue Voltaren gel but she will schedule with me if she decides she would like injection therapy  No orders of the defined types were placed in this encounter.  No orders of the defined types were placed in this encounter.    Janora Norlander, DO [place] [phone]

## 2021-03-25 ENCOUNTER — Encounter: Payer: Self-pay | Admitting: Dermatology

## 2021-03-25 NOTE — Progress Notes (Signed)
   Follow-Up Visit   Subjective  [place]la [facility] is a 75 y.o. female who presents for the following: Annual Exam (Here for annual skin exam. No concerns. Patient does get some itching on her lower legs. X months. Comes and goes. History of non mole skin cancers. ).  General skin examination, history of nonmelanoma skin cancer Location:  Duration:  Quality:  Associated Signs/Symptoms: Modifying Factors:  Severity:  Timing: Context:   Objective  Well appearing patient in no apparent distress; mood and affect are within normal limits. Right Axilla Fleshy, skin-colored pedunculated papules.    Mid Back Flattopped 51mm brown textured papules stuck-on, waxy papules and plaques.   Mid Back Full body skin exam: No atypical pigmented lesions, new or recurrent nonmelanoma skin cancer  Chest - Medial (Center) Multiple smooth red 1 to 3 mm dermal papules    A full examination was performed including scalp, head, eyes, ears, nose, lips, neck, chest, axillae, abdomen, back, buttocks, bilateral upper extremities, bilateral lower extremities, hands, feet, fingers, toes, fingernails, and toenails. All findings within normal limits unless otherwise noted below.  Areas beneath undergarments not fully examined.   Assessment & Plan    Skin tag Right Axilla  Benign no treatment  Seborrheic keratosis Mid Back  Leave if stable  Skin exam for malignant neoplasm Mid Back  Yearly skin exams.  Hemangioma of skin Chest - Medial [facility])  No intervention necessary      I, Lavonna Monarch, MD, have reviewed all documentation for this visit.  The documentation on 03/25/21 for the exam, diagnosis, procedures, and orders are all accurate and complete.

## 2021-03-31 DIAGNOSIS — Z23 Encounter for immunization: Secondary | ICD-10-CM | POA: Diagnosis not present

## 2021-04-19 NOTE — Progress Notes (Signed)
[facility]  [phone] [phone]     ID: Kayla [facility] DOB: February 10, 1946  MR#: [id number]  [name]  Patient Care Team: Kayla Ip, DO as PCP - General (Family Medicine) Kayla Rigger, MD as Consulting Physician (Gastroenterology) Kayla Harder, MD as Consulting Physician (Dermatology) Kayla Proud, RN as Oncology Nurse Navigator Kayla Angelica, RN as Oncology Nurse Navigator [place], Kayla Hue, MD as Consulting Physician (Oncology) Kayla Bolus, MD as Consulting Physician (Ophthalmology) Kayla Puffer, MD as Consulting Physician (Radiation Oncology) Kayla Bouillon, MD as Consulting Physician (General Surgery) Kayla Dell, MD OTHER MD:  CHIEF COMPLAINT: estrogen receptor positive breast cancer  CURRENT TREATMENT: [place]   INTERVAL HISTORY: [place] returns today for follow up of her estrogen receptor positive breast cancer.   She started [place] on 07/02/2019.  She tolerates this with no side effects that she is aware of and in particular hot flashes and vaginal wetness are not issues.  Since her last visit, she underwent bilateral diagnostic mammography with tomography at [facility] on 05/13/2020 showing: breast density category C; no evidence of malignancy in either breast.   She also underwent bone density screening on 06/18/2020 showing a T-score of -2.0, which is considered osteopenic. This is stable from prior in 06/2018.   REVIEW OF SYSTEMS: [place]la [place] is not exercising regularly.  She tells me she and her husband are thinking of joining an exercise place in [place].  She does have about 2 stories in her house and is up and down Kayla stairs all day she says.  A detailed review of systems was otherwise stable.   COVID 19 VACCINATION STATUS: Pfizer x3, and Moderna booster 11/2020   HISTORY OF CURRENT ILLNESS:    From Kayla original intake note:   "[place]la [facility]" had routine screening mammography on 03/02/2019 showing  a possible abnormality in Kayla left breast. She underwent left diagnostic mammography with tomography and left breast ultrasonography at [facility] on 03/09/2019 showing: breast density category C; suspicious 1.1 cm left breast mass at 3:30 with associated punctate calcifications; no left axillary adenopathy.  Accordingly on 03/13/2019 she proceeded to biopsy of Kayla left breast area in question. Kayla pathology from this procedure ([id number]) showed: invasive ductal carcinoma, grade 2. Prognostic indicators significant for: estrogen receptor, 100% positive and progesterone receptor, 100% positive, both with strong staining intensity. Proliferation marker Ki67 at 30%. HER2 negative by immunohistochemistry (1+).  Kayla patient's subsequent history is as detailed below.   PAST MEDICAL HISTORY: Past Medical History:  Diagnosis Date   Allergy    Anxiety    Atypical mole 11/23/2002   moderate-right lower back([facility])   Basal cell carcinoma 02/06/2019   nod-left nare ([facility])   Breast cancer ([facility])    Cataract    Depression    History of colon polyps    Hyperlipidemia    Hypothyroidism    Interstitial cystitis    History of   Osteopenia    Personal history of radiation therapy    S/P lumpectomy, left breast 04/03/2019   Skin cancer    removed from left nostril   Vitamin D deficiency     PAST SURGICAL HISTORY: Past Surgical History:  Procedure Laterality Date   BREAST BIOPSY Left 03/13/2019   BREAST LUMPECTOMY Left 04/03/2019   BREAST LUMPECTOMY WITH RADIOACTIVE SEED AND SENTINEL LYMPH NODE BIOPSY Left 04/03/2019   Procedure: LEFT BREAST LUMPECTOMY WITH RADIOACTIVE SEED AND SENTINEL LYMPH NODE MAPPING;  Surgeon: Kayla Bouillon, MD;  Location: [facility];  Service: General;  Laterality: Left;   CESAREAN SECTION     EYE SURGERY Right    cataract   EYE SURGERY Left    cataract   TONSILLECTOMY      FAMILY HISTORY: Family History  Problem Relation Age of Onset   Heart  attack Father    Congestive Heart Failure Father    Osteoporosis Maternal Aunt    Uterine cancer Maternal Aunt    Cancer Mother        possible lung - spread    Early death Mother    Stroke Maternal Grandmother    Breast cancer Paternal Grandmother    Stroke Paternal Grandfather   Patient's father was 30 years old when he died from congestive heart failure. Patient's mother died from cancer, type unknown to Kayla patient, at age 70. Kayla patient denies a family hx of ovarian cancer. She reports a maternal aunt was diagnosed with uterine cancer (age unsure), and her paternal grandmother with breast cancer at a very young age (exact age unsure).  Kayla patient has no siblings.   GYNECOLOGIC HISTORY:  No LMP recorded. Patient is postmenopausal. Menarche: [age] years old Age at Kayla live birth: 75 years old GX P 2 LMP age [age] Contraceptive: yes, used pills on and off from age [age] without issue. HRT: yes, for about 5 years, until age 84 or 35.  Hysterectomy? no BSO? no   SOCIAL HISTORY: (updated 03/2019)  [place] is currently retired from working as a Runner, broadcasting/film/video (fourth grade and substitute).  Husband [place] [facility] is retired Solicitor in [place] [employer].. She lives at home with [place] and 2 cats. Daughter [place]la [facility], age 65, lives in [place] as a homemaker. Son Kayla [facility], age 27, lives in [place], [place] as a IT trainer.  Kayla patient has 4 grandchildren one of them attending [employer] [facility].  She attends [place] in [place]    ADVANCED DIRECTIVES: In Kayla absence of any documentation to Kayla contrary, Kayla patient's spouse is their HCPOA.   HEALTH MAINTENANCE: Social History   Tobacco Use   Smoking status: Never   Smokeless tobacco: Never  Vaping Use   Vaping Use: Never used  Substance Use Topics   Alcohol use: Yes    Alcohol/week: 7.0 standard drinks    Types: 7 Standard drinks or equivalent per week    Comment: wine nightly   Drug use: No     Colonoscopy: 09/2011  PAP: 2019  Bone density:  06/2018, -2.0   Allergies  Allergen Reactions   Macrodantin    Sulfa Antibiotics    Wellbutrin [Bupropion Hcl]     Current Outpatient Medications  Medication Sig Dispense Refill   Black Pepper-Turmeric (TURMERIC COMPLEX/BLACK PEPPER PO) Take by mouth.     Cholecalciferol (VITAMIN D3) 1000 UNITS CAPS Take 2 capsules by mouth daily.     cycloSPORINE (RESTASIS) 0.05 % ophthalmic emulsion Place 1 drop into both eyes 2 (two) times daily.     escitalopram (LEXAPRO) 10 MG tablet Take 1 tablet (10 mg total) by mouth daily. 90 tablet 3   estradiol (ESTRACE) 0.1 MG/GM vaginal cream INSERT 1 VAGINALLY AT BEDTIME 127.5 g 2   fluticasone (FLONASE) 50 MCG/ACT nasal spray 1 SPRAY IN EACH NOSTRIL EVERY 12 HOURS 48 g 0   levothyroxine (SYNTHROID) 25 MCG tablet TAKE 1 TABLET EVERY DAY BEFORE BREAKFAST 90 tablet 3   Multiple Vitamins-Minerals (WOMENS 50+ MULTI VITAMIN/MIN PO) Take by mouth.     Omega-3 Fatty Acids (SUPER  OMEGA-3 PO) Take by mouth.     Polyethylene Glycol 3350 (MIRALAX PO) Take 1 packet by mouth daily as needed.     rosuvastatin (CRESTOR) 5 MG tablet TAKE (1) TABLET DAILY AS DIRECTED. 90 tablet 0   Specialty Vitamins Products (ONE-A-DAY BONE STRENGTH) 500-28-100 MG-MG-UNIT TABS Take 1 tablet by mouth 3 (three) times daily.     [place] (NOLVADEX) 20 MG tablet Take 1 tablet (20 mg total) by mouth daily. 90 tablet 4   No current facility-administered medications for this visit.    OBJECTIVE: white woman who appears younger than stated age  Vitals:   04/20/21 1156  BP: (!) 148/66  Pulse: 71  Resp: 18  Temp: 98.1 F (36.7 C)  SpO2: 99%      Body mass index is 19.2 kg/m.   Wt Readings from Last 3 Encounters:  04/20/21 115 lb 6.4 oz (52.3 kg)  03/24/21 114 lb (51.7 kg)  12/03/20 114 lb (51.7 kg)     ECOG FS:1 - Symptomatic but completely ambulatory  Sclerae unicteric, EOMs intact Wearing a mask No cervical or supraclavicular adenopathy Lungs no rales or rhonchi Heart  regular rate and rhythm Abd soft, nontender, positive bowel sounds MSK no focal spinal tenderness, Heberden's nodes noted as before Neuro: nonfocal, well oriented, appropriate affect Breasts: Kayla right breast is unremarkable.  Kayla left breast has undergone lumpectomy followed by radiation.  There is no evidence of disease recurrence.  Both axillae benign   LAB RESULTS:  CMP     Component Value Date/Time   NA 144 05/23/2020 0930   K 4.1 05/23/2020 0930   CL 105 05/23/2020 0930   CO2 25 05/23/2020 0930   GLUCOSE 83 05/23/2020 0930   GLUCOSE 89 03/18/2020 1119   BUN 14 05/23/2020 0930   CREATININE 0.74 05/23/2020 0930   CREATININE 0.76 06/21/2019 1204   CREATININE 0.69 07/24/2013 0836   CALCIUM 9.1 05/23/2020 0930   PROT 6.3 05/23/2020 0930   ALBUMIN 4.2 05/23/2020 0930   AST 23 05/23/2020 0930   AST 23 06/21/2019 1204   ALT 15 05/23/2020 0930   ALT 18 06/21/2019 1204   ALKPHOS 36 (L) 05/23/2020 0930   BILITOT 0.4 05/23/2020 0930   BILITOT 0.4 06/21/2019 1204   GFRNONAA 80 05/23/2020 0930   GFRNONAA >60 03/18/2020 1119   GFRNONAA >60 06/21/2019 1204   GFRNONAA 89 07/24/2013 0836   GFRAA 92 05/23/2020 0930   GFRAA >60 06/21/2019 1204   GFRAA >89 07/24/2013 0836    No results found for: TOTALPROTELP, ALBUMINELP, A1GS, A2GS, BETS, BETA2SER, GAMS, MSPIKE, SPEI  No results found for: KPAFRELGTCHN, LAMBDASER, KAPLAMBRATIO  Lab Results  Component Value Date   WBC 5.3 04/20/2021   NEUTROABS 3.4 04/20/2021   HGB 12.1 04/20/2021   HCT 37.5 04/20/2021   MCV 87.4 04/20/2021   PLT 170 04/20/2021   No results found for: LABCA2  No components found for: [id number]  No results for input(s): INR in Kayla last 168 hours.  No results found for: LABCA2  No results found for: MWN027  No results found for: OZD664  No results found for: QIH474  No results found for: CA2729  No components found for: HGQUANT  No results found for: CEA1 / No results found for: CEA1   No  results found for: AFPTUMOR  No results found for: CHROMOGRNA  No results found for: HGBA, HGBA2QUANT, HGBFQUANT, HGBSQUAN (Hemoglobinopathy evaluation)   No results found for: LDH  No results found for: IRON, TIBC, IRONPCTSAT (  Iron and TIBC)  Lab Results  Component Value Date   FERRITIN 23 09/19/2012    Urinalysis    Component Value Date/Time   APPEARANCEUR Clear 08/27/2019 0947   GLUCOSEU Negative 08/27/2019 0947   BILIRUBINUR Negative 08/27/2019 0947   PROTEINUR Negative 08/27/2019 0947   UROBILINOGEN negative 12/02/2014 1128   NITRITE Negative 08/27/2019 0947   LEUKOCYTESUR Negative 08/27/2019 0947    STUDIES: No results found.    ELIGIBLE FOR AVAILABLE RESEARCH PROTOCOL: Speaker phone study  ASSESSMENT: 75 y.o. [place], [place] woman status post left breast upper inner quadrant biopsy 03/13/2019 for a clinical T1c N0, stage IA invasive ductal carcinoma, grade 2, estrogen and progesterone receptor positive, HER-2 not amplified, with an MIB-1 of 30%  (1) status post left lumpectomy 04/03/2019 for a pT1c pN0, stage IB invasive ductal carcinoma, grade 2, with negative margins  (a) a total of 2 left axillary lymph nodes were clear  (2) Oncotype score of 21 predicts a risk of recurrence outside Kayla breast within Kayla next 9 years of 7% if Kayla patient's only systemic therapy is antiestrogens for 5 years.  It also predicts no benefit from chemotherapy.  (3) adjuvant radiation 05/14/2019 through 06/11/2019  (4) started [place] 07/02/2019  (a) bone density 06/22/2019 shows a T score of -2.0  (b) on Estrace vaginal suppositories   PLAN:  Juniya is now just over 2 years out from definitive surgery for her breast cancer with no evidence of disease recurrence.  This is very favorable.  She is tolerating [place] remarkably well.  Kayla plan is to continue that a minimum of 5 years.  So long as she is on [place] I am comfortable with her continuing her vaginal estrogen.    I  encouraged her to exercise at least 5 days a week.  She could vary from cardio to weights to simply walking but whenever she chooses I think will be a good investment for her in terms of time and future health  Total encounter time 20 minutes.Kayla Dell, MD   04/20/2021 12:10 PM Medical Oncology and Hematology [facility] [address] [place], [place] [address] Tel. [phone]    Fax. [phone]   This document serves as a record of services personally performed by Ruthann Cancer, MD. It was created on his behalf by Mickie Bail, a trained medical scribe. Kayla creation of this record is based on Kayla scribe's personal observations and Kayla provider's statements to them.   I, Ruthann Cancer MD, have reviewed Kayla above documentation for accuracy and completeness, and I agree with Kayla above.   *Total Encounter Time as defined by [place] [facility] for Medicare and Medicaid Services includes, in addition to Kayla face-to-face time of a patient visit (documented in Kayla note above) non-face-to-face time: obtaining and reviewing outside history, ordering and reviewing medications, tests or procedures, care coordination (communications with other health care professionals or caregivers) and documentation in Kayla medical record.

## 2021-04-20 ENCOUNTER — Inpatient Hospital Stay: Payer: Medicare Other | Attending: Oncology | Admitting: Oncology

## 2021-04-20 ENCOUNTER — Other Ambulatory Visit: Payer: Self-pay

## 2021-04-20 ENCOUNTER — Inpatient Hospital Stay (HOSPITAL_BASED_OUTPATIENT_CLINIC_OR_DEPARTMENT_OTHER): Payer: Medicare Other

## 2021-04-20 VITALS — BP 148/66 | HR 71 | Temp 98.1°F | Resp 18 | Ht 65.0 in | Wt 115.4 lb

## 2021-04-20 DIAGNOSIS — C50212 Malignant neoplasm of upper-inner quadrant of left female breast: Secondary | ICD-10-CM

## 2021-04-20 DIAGNOSIS — Z170 Estrogen receptor positive status [ER+]: Secondary | ICD-10-CM | POA: Diagnosis not present

## 2021-04-20 DIAGNOSIS — M8580 Other specified disorders of bone density and structure, unspecified site: Secondary | ICD-10-CM | POA: Diagnosis not present

## 2021-04-20 DIAGNOSIS — E559 Vitamin D deficiency, unspecified: Secondary | ICD-10-CM

## 2021-04-20 DIAGNOSIS — Z79810 Long term (current) use of selective estrogen receptor modulators (SERMs): Secondary | ICD-10-CM | POA: Insufficient documentation

## 2021-04-20 DIAGNOSIS — G5692 Unspecified mononeuropathy of left upper limb: Secondary | ICD-10-CM

## 2021-04-20 LAB — CBC WITH DIFFERENTIAL/PLATELET
Abs Immature Granulocytes: 0.02 10*3/uL (ref 0.00–0.07)
Basophils Absolute: 0 10*3/uL (ref 0.0–0.1)
Basophils Relative: 1 %
Eosinophils Absolute: 0.2 10*3/uL (ref 0.0–0.5)
Eosinophils Relative: 3 %
HCT: 37.5 % (ref 36.0–46.0)
Hemoglobin: 12.1 g/dL (ref 12.0–15.0)
Immature Granulocytes: 0 %
Lymphocytes Relative: 20 %
Lymphs Abs: 1.1 10*3/uL (ref 0.7–4.0)
MCH: 28.2 pg (ref 26.0–34.0)
MCHC: 32.3 g/dL (ref 30.0–36.0)
MCV: 87.4 fL (ref 80.0–100.0)
Monocytes Absolute: 0.6 10*3/uL (ref 0.1–1.0)
Monocytes Relative: 12 %
Neutro Abs: 3.4 10*3/uL (ref 1.7–7.7)
Neutrophils Relative %: 64 %
Platelets: 170 10*3/uL (ref 150–400)
RBC: 4.29 MIL/uL (ref 3.87–5.11)
RDW: 15.3 % (ref 11.5–15.5)
WBC: 5.3 10*3/uL (ref 4.0–10.5)
nRBC: 0 % (ref 0.0–0.2)

## 2021-04-20 LAB — COMPREHENSIVE METABOLIC PANEL
ALT: 12 U/L (ref 0–44)
AST: 20 U/L (ref 15–41)
Albumin: 3.5 g/dL (ref 3.5–5.0)
Alkaline Phosphatase: 31 U/L — ABNORMAL LOW (ref 38–126)
Anion gap: 9 (ref 5–15)
BUN: 15 mg/dL (ref 8–23)
CO2: 27 mmol/L (ref 22–32)
Calcium: 8.7 mg/dL — ABNORMAL LOW (ref 8.9–10.3)
Chloride: 106 mmol/L (ref 98–111)
Creatinine, Ser: 0.71 mg/dL (ref 0.44–1.00)
GFR, Estimated: >60 mL/min (ref >60)
Glucose, Bld: 83 mg/dL (ref 70–99)
Potassium: 3.9 mmol/L (ref 3.5–5.1)
Sodium: 142 mmol/L (ref 135–145)
Total Bilirubin: 0.4 mg/dL (ref 0.3–1.2)
Total Protein: 6.2 g/dL — ABNORMAL LOW (ref 6.5–8.1)

## 2021-04-20 MED ORDER — TAMOXIFEN CITRATE 20 MG PO TABS: 20.0000 mg | ORAL_TABLET | Freq: Every day | ORAL | 4 refills | Status: DC

## 2021-04-21 ENCOUNTER — Ambulatory Visit: Payer: Medicare Other | Admitting: Family Medicine

## 2021-04-21 ENCOUNTER — Telehealth: Payer: Self-pay | Admitting: Oncology

## 2021-04-21 NOTE — Telephone Encounter (Signed)
Scheduled appointment per 1/25 los. Patient is aware. 

## 2021-05-05 ENCOUNTER — Other Ambulatory Visit: Payer: Self-pay | Admitting: Family Medicine

## 2021-05-14 ENCOUNTER — Other Ambulatory Visit: Payer: Self-pay | Admitting: Oncology

## 2021-05-14 DIAGNOSIS — Z853 Personal history of malignant neoplasm of breast: Secondary | ICD-10-CM

## 2021-05-19 ENCOUNTER — Other Ambulatory Visit: Payer: Self-pay | Admitting: Hematology and Oncology

## 2021-05-19 ENCOUNTER — Ambulatory Visit
Admission: RE | Admit: 2021-05-19 | Discharge: 2021-05-19 | Disposition: A | Discharge status: Home / Self Care | Payer: PRIVATE HEALTH INSURANCE | Source: Ambulatory Visit | Attending: Oncology | Admitting: Oncology

## 2021-05-19 DIAGNOSIS — Z853 Personal history of malignant neoplasm of breast: Secondary | ICD-10-CM

## 2021-05-19 DIAGNOSIS — R922 Inconclusive mammogram: Secondary | ICD-10-CM | POA: Diagnosis not present

## 2021-06-08 ENCOUNTER — Other Ambulatory Visit: Payer: Self-pay | Admitting: Family Medicine

## 2021-08-10 DIAGNOSIS — H04123 Dry eye syndrome of bilateral lacrimal glands: Secondary | ICD-10-CM | POA: Diagnosis not present

## 2021-08-10 DIAGNOSIS — H5213 Myopia, bilateral: Secondary | ICD-10-CM | POA: Diagnosis not present

## 2021-08-10 DIAGNOSIS — H52203 Unspecified astigmatism, bilateral: Secondary | ICD-10-CM | POA: Diagnosis not present

## 2021-08-10 DIAGNOSIS — H524 Presbyopia: Secondary | ICD-10-CM | POA: Diagnosis not present

## 2021-08-10 DIAGNOSIS — Z961 Presence of intraocular lens: Secondary | ICD-10-CM | POA: Diagnosis not present

## 2021-09-10 ENCOUNTER — Encounter: Payer: Self-pay | Admitting: Family Medicine

## 2021-09-10 ENCOUNTER — Ambulatory Visit (INDEPENDENT_AMBULATORY_CARE_PROVIDER_SITE_OTHER): Payer: Medicare Other | Admitting: Family Medicine

## 2021-09-10 VITALS — BP 117/66 | HR 67 | Temp 98.1°F | Ht 65.0 in | Wt 114.2 lb

## 2021-09-10 DIAGNOSIS — R21 Rash and other nonspecific skin eruption: Secondary | ICD-10-CM

## 2021-09-10 MED ORDER — TRIAMCINOLONE ACETONIDE 0.5 % EX OINT: 1.0000 "application " | TOPICAL_OINTMENT | Freq: Two times a day (BID) | CUTANEOUS | 0 refills | Status: DC

## 2021-09-10 MED ORDER — PREDNISONE 10 MG (21) PO TBPK: ORAL_TABLET | ORAL | 0 refills | Status: DC

## 2021-09-10 NOTE — Patient Instructions (Signed)
Pruritus Pruritus is an itchy feeling on the skin. One of the most common causes is dry skin, but many different things can cause itching. Most cases of itching do not require medical attention. Sometimes itchy skin can turn into a rash. Follow these instructions at home: Skin care  Apply moisturizing lotion to your skin as needed. Lotion that contains petroleum jelly is best. Take medicines or apply medicated creams only as told by your health care provider. This may include: Corticosteroid cream. Anti-itch lotions. Oral antihistamines. Apply a cool, wet cloth (cool compress) to the affected areas. Take baths with one of the following: Epsom salts. You can get these at your local pharmacy or grocery store. Follow the instructions on the packaging. Baking soda. Pour a small amount into the bath as told by your health care provider. Colloidal oatmeal. You can get this at your local pharmacy or grocery store. Follow the instructions on the packaging. Apply baking soda paste to your skin. To make the paste, stir water into a small amount of baking soda until it reaches a paste-like consistency. Do not scratch your skin. Do not take hot showers or baths, which can make itching worse. A cool shower may help with itching as long as you apply moisturizing lotion after the shower. Do not use scented soaps, detergents, perfumes, and cosmetic products. Instead, use gentle, unscented versions of these items. General instructions Avoid wearing tight clothes. Keep a journal to help find out what is causing your itching. Write down: What you eat and drink. What cosmetic products you use. What soaps or detergents you use. What you wear, including jewelry. Use a humidifier. This keeps the air moist, which helps to prevent dry skin. Be aware of any changes in your itchiness. Contact a health care provider if: The itching does not go away after several days. You are unusually thirsty or urinating more  than normal. Your skin tingles or feels numb. Your skin or the white parts of your eyes turn yellow (jaundice). You feel weak. You have any of the following: Night sweats. Tiredness (fatigue). Weight loss. Abdominal pain. Summary Pruritus is an itchy feeling on the skin. One of the most common causes is dry skin, but many different conditions and factors can cause itching. Apply moisturizing lotion to your skin as needed. Lotion that contains petroleum jelly is best. Take medicines or apply medicated creams only as told by your health care provider. Do not take hot showers or baths. Do not use scented soaps, detergents, perfumes, or cosmetic products. This information is not intended to replace advice given to you by your health care provider. Make sure you discuss any questions you have with your health care provider. Document Revised: 02/16/2021 Document Reviewed: 02/16/2021 Elsevier Patient Education  2022 Elsevier Inc.  

## 2021-09-10 NOTE — Progress Notes (Signed)
? ?  Acute Office Visit ? ?Subjective:  ? ?  ?Patient ID: Kayla Randolph, female    DOB: [birth year], 76 y.o.   MRN: [id number] ? ?Chief Complaint  ?Patient presents with  ? Pruritis  ? ? ?HPI ?Patient is in today for itchy. She reports this started as hives on her face and right ear 6 days ago. The hives have resolved now, but these areas are still itchy. She now has itchy red bumps on her arms, face, and chest. She has been using lotion to the areas. She denies any new products, medications, or foods. She has not been doing any yard work recently. Denies difficulty breathing or swallowing. Denies swelling of mouth or throat. Denies fever.  ? ?ROS ?As per HPI.  ? ?   ?Objective:  ?  ?BP 117/66   Pulse 67   Temp 98.1 ?F (36.7 ?C) (Temporal)   Ht '5\' 5"'$  (1.651 m)   Wt 114 lb 4 oz (51.8 kg)   BMI 19.01 kg/m?  ? ? ?Physical Exam ?Vitals and nursing note reviewed.  ?Constitutional:   ?   General: She is not in acute distress. ?   Appearance: She is not ill-appearing, toxic-appearing or diaphoretic.  ?HENT:  ?   Nose: Nose normal.  ?   Mouth/Throat:  ?   Mouth: Mucous membranes are moist. No angioedema.  ?   Pharynx: Oropharynx is clear.  ?Pulmonary:  ?   Effort: Pulmonary effort is normal. No respiratory distress.  ?   Breath sounds: No wheezing.  ?Musculoskeletal:  ?   Right lower leg: No edema.  ?   Left lower leg: No edema.  ?Skin: ?   General: Skin is warm and dry.  ?   Comments: Scatter maculopapular rash to bilateral arms and chest noted. No signs of infection.   ?Neurological:  ?   Mental Status: She is alert and oriented to person, place, and time.  ?Psychiatric:     ?   Mood and Affect: Mood normal.     ?   Behavior: Behavior normal.  ? ? ?No results found for any visits on 09/10/21. ? ? ?   ?Assessment & Plan:  ? ?Brocha was seen today for pruritis. ? ?Diagnoses and all orders for this visit: ? ?Rash ??allergy. Kenalog cream as below. Zyrtec for itching. Prednisone taper pack ordered. Return to office for new or  worsening symptoms, or if symptoms persist.  ?-     triamcinolone ointment (KENALOG) 0.5 %; Apply 1 application. topically 2 (two) times daily. ?-     predniSONE (STERAPRED UNI-PAK 21 TAB) 10 MG (21) TBPK tablet; Use as directed on back of pill pack ? ? ?Return if symptoms worsen or fail to improve. ? ?The patient indicates understanding of these issues and agrees with the plan. ? ?Gwenlyn Perking, FNP ? ? ?

## 2021-09-11 ENCOUNTER — Encounter: Payer: Self-pay | Admitting: Family Medicine

## 2021-09-18 ENCOUNTER — Encounter: Payer: Self-pay | Admitting: Family Medicine

## 2021-09-18 ENCOUNTER — Ambulatory Visit (INDEPENDENT_AMBULATORY_CARE_PROVIDER_SITE_OTHER): Payer: Medicare Other | Admitting: Family Medicine

## 2021-09-18 VITALS — BP 119/60 | HR 70 | Temp 98.1°F | Ht 65.0 in | Wt 113.4 lb

## 2021-09-18 DIAGNOSIS — Z170 Estrogen receptor positive status [ER+]: Secondary | ICD-10-CM | POA: Diagnosis not present

## 2021-09-18 DIAGNOSIS — M8580 Other specified disorders of bone density and structure, unspecified site: Secondary | ICD-10-CM | POA: Diagnosis not present

## 2021-09-18 DIAGNOSIS — M79671 Pain in right foot: Secondary | ICD-10-CM | POA: Diagnosis not present

## 2021-09-18 DIAGNOSIS — R21 Rash and other nonspecific skin eruption: Secondary | ICD-10-CM

## 2021-09-18 DIAGNOSIS — R0982 Postnasal drip: Secondary | ICD-10-CM

## 2021-09-18 DIAGNOSIS — Z0001 Encounter for general adult medical examination with abnormal findings: Secondary | ICD-10-CM

## 2021-09-18 DIAGNOSIS — E034 Atrophy of thyroid (acquired): Secondary | ICD-10-CM | POA: Diagnosis not present

## 2021-09-18 DIAGNOSIS — C50212 Malignant neoplasm of upper-inner quadrant of left female breast: Secondary | ICD-10-CM

## 2021-09-18 DIAGNOSIS — Z23 Encounter for immunization: Secondary | ICD-10-CM

## 2021-09-18 DIAGNOSIS — E559 Vitamin D deficiency, unspecified: Secondary | ICD-10-CM | POA: Diagnosis not present

## 2021-09-18 DIAGNOSIS — Z0000 Encounter for general adult medical examination without abnormal findings: Secondary | ICD-10-CM

## 2021-09-18 MED ORDER — LEVOCETIRIZINE DIHYDROCHLORIDE 5 MG PO TABS: 5.0000 mg | ORAL_TABLET | Freq: Every evening | ORAL | 3 refills | Status: DC | PRN

## 2021-09-18 NOTE — Patient Instructions (Signed)
You had labs performed today.  You will be contacted with the results of the labs once they are available, usually in the next 3 business days for routine lab work.  If you have an active my chart account, they will be released to your MyChart.  If you prefer to have these labs released to you via telephone, please let us know.   Preventive Care 65 Years and Older, Female Preventive care refers to lifestyle choices and visits with your health care provider that can promote health and wellness. Preventive care visits are also called wellness exams. What can I expect for my preventive care visit? Counseling Your health care provider may ask you questions about your: Medical history, including: Past medical problems. Family medical history. Pregnancy and menstrual history. History of falls. Current health, including: Memory and ability to understand (cognition). Emotional well-being. Home life and relationship well-being. Sexual activity and sexual health. Lifestyle, including: Alcohol, nicotine or tobacco, and drug use. Access to firearms. Diet, exercise, and sleep habits. Work and work environment. Sunscreen use. Safety issues such as seatbelt and bike helmet use. Physical exam Your health care provider will check your: Height and weight. These may be used to calculate your BMI (body mass index). BMI is a measurement that tells if you are at a healthy weight. Waist circumference. This measures the distance around your waistline. This measurement also tells if you are at a healthy weight and may help predict your risk of certain diseases, such as type 2 diabetes and high blood pressure. Heart rate and blood pressure. Body temperature. Skin for abnormal spots. What immunizations do I need?  Vaccines are usually given at various ages, according to a schedule. Your health care provider will recommend vaccines for you based on your age, medical history, and lifestyle or other factors, such  as travel or where you work. What tests do I need? Screening Your health care provider may recommend screening tests for certain conditions. This may include: Lipid and cholesterol levels. Hepatitis C test. Hepatitis B test. HIV (human immunodeficiency virus) test. STI (sexually transmitted infection) testing, if you are at risk. Lung cancer screening. Colorectal cancer screening. Diabetes screening. This is done by checking your blood sugar (glucose) after you have not eaten for a while (fasting). Mammogram. Talk with your health care provider about how often you should have regular mammograms. BRCA-related cancer screening. This may be done if you have a family history of breast, ovarian, tubal, or peritoneal cancers. Bone density scan. This is done to screen for osteoporosis. Talk with your health care provider about your test results, treatment options, and if necessary, the need for more tests. Follow these instructions at home: Eating and drinking  Eat a diet that includes fresh fruits and vegetables, whole grains, lean protein, and low-fat dairy products. Limit your intake of foods with high amounts of sugar, saturated fats, and salt. Take vitamin and mineral supplements as recommended by your health care provider. Do not drink alcohol if your health care provider tells you not to drink. If you drink alcohol: Limit how much you have to 0-1 drink a day. Know how much alcohol is in your drink. In the [place] one drink equals one 12 oz bottle of beer (355 mL), one 5 oz glass of wine (148 mL), or one 1 oz glass of hard liquor (44 mL). Lifestyle Brush your teeth every morning and night with fluoride toothpaste. Floss one time each day. Exercise for at least 30 minutes 5 or more   days each week. Do not use any products that contain nicotine or tobacco. These products include cigarettes, chewing tobacco, and vaping devices, such as e-cigarettes. If you need help quitting, ask your health  care provider. Do not use drugs. If you are sexually active, practice safe sex. Use a condom or other form of protection in order to prevent STIs. Take aspirin only as told by your health care provider. Make sure that you understand how much to take and what form to take. Work with your health care provider to find out whether it is safe and beneficial for you to take aspirin daily. Ask your health care provider if you need to take a cholesterol-lowering medicine (statin). Find healthy ways to manage stress, such as: Meditation, yoga, or listening to music. Journaling. Talking to a trusted person. Spending time with friends and family. Minimize exposure to UV radiation to reduce your risk of skin cancer. Safety Always wear your seat belt while driving or riding in a vehicle. Do not drive: If you have been drinking alcohol. Do not ride with someone who has been drinking. When you are tired or distracted. While texting. If you have been using any mind-altering substances or drugs. Wear a helmet and other protective equipment during sports activities. If you have firearms in your house, make sure you follow all gun safety procedures. What's next? Visit your health care provider once a year for an annual wellness visit. Ask your health care provider how often you should have your eyes and teeth checked. Stay up to date on all vaccines. This information is not intended to replace advice given to you by your health care provider. Make sure you discuss any questions you have with your health care provider. Document Revised: 10/29/2020 Document Reviewed: 10/29/2020 Elsevier Patient Education  2023 Elsevier Inc.   

## 2021-09-18 NOTE — Progress Notes (Signed)
? ?Kayla Randolph is a 76 y.o. female presents to office today for annual physical exam examination.   ? ?Concerns today include: ?1.  Right foot pain ?Patient reports intermittent right-sided foot pain.  She points to the superior aspect of the right lateral foot as the area of intermittent pain.  She notes that this is most prominent in the morning time but that resolves after she is moved around a bit.  Does not report any preceding injury, discoloration of the foot.  Sometimes she has discoloration of a couple of her fingers on the left hand that is associated with numbness and tingling but that usually goes away on its own. ? ?2.  Itching ?Continues to have itchy rash.  Notes that the prednisone did help some.  Not currently taking any oral antihistamines.  Reports nasal drainage as well that is not relieved by Flonase. ? ?3.  Breast cancer ?Patient with left sided breast cancer.  She is treated with tamoxifen.  She has an appointment to follow-up with oncology in about 2 weeks.  Does not report any left upper extremity swelling.  She occasionally gets a little bit of tenderness on that left side but otherwise is doing relatively well ? ?Occupation: retired, Marital status: married, Substance use: none ?Diet: fair, admits to inadequate hydration, Exercise: stays active ?Last eye exam: UTD ?Last dental exam: UTD ?Last colonoscopy: UTD ?Last mammogram: UTD ?Last pap smear: n.a ?Refills needed today: none ?Immunizations needed: ?Immunization History  ?Administered Date(s) Administered  ? Fluad Quad(high Dose 65+) 03/01/2019, 03/04/2020, 02/23/2021  ? Influenza Whole 02/14/2010  ? Influenza, High Dose Seasonal PF 03/18/2016, 02/17/2017, 03/07/2018  ? Influenza,inj,Quad PF,6+ Mos 04/03/2013, 03/14/2014, 03/28/2015  ? Moderna Sars-Covid-2 Vaccination 11/25/2020  ? PFIZER(Purple Top)SARS-COV-2 Vaccination 06/05/2019, 06/28/2019, 03/19/2020  ? Pneumococcal Conjugate-13 05/15/2013  ? Pneumococcal Polysaccharide-23  08/15/2009  ? Td 06/09/2018  ? Tdap 05/18/2007  ? Zoster Recombinat (Shingrix) 10/07/2016, 03/24/2017  ? Zoster, Live 06/09/2006  ? ? ? ?Past Medical History:  ?Diagnosis Date  ? Allergy   ? Anxiety   ? Atypical mole 11/23/2002  ? moderate-right lower back([facility])  ? Basal cell carcinoma 02/06/2019  ? nod-left nare ([facility])  ? Breast cancer ([place])   ? Cataract   ? Depression   ? History of colon polyps   ? Hyperlipidemia   ? Hypothyroidism   ? Interstitial cystitis   ? History of  ? Osteopenia   ? Personal history of radiation therapy   ? S/P lumpectomy, left breast 04/03/2019  ? Skin cancer   ? removed from left nostril  ? Vitamin D deficiency   ? ?Social History  ? ?Socioeconomic History  ? Marital status: Married  ?  Spouse name: Ed  ? Number of children: 2  ? Years of education: 68  ? Highest education level: Bachelor's degree (e.g., BA, AB, BS)  ?Occupational History  ? Occupation: Retired  ?  Comment: Teaching and tutoring  ?Tobacco Use  ? Smoking status: Never  ? Smokeless tobacco: Never  ?Vaping Use  ? Vaping Use: Never used  ?Substance and Sexual Activity  ? Alcohol use: Yes  ?  Alcohol/week: 7.0 standard drinks  ?  Types: 7 Standard drinks or equivalent per week  ?  Comment: wine nightly  ? Drug use: No  ? Sexual activity: Yes  ?  Birth control/protection: Post-menopausal  ?Other Topics Concern  ? Not on file  ?Social History Narrative  ? Lives with Ed  ? 2 cats   ?  2 children = neither local   ? ?Social Determinants of Health  ? ?Financial Resource Strain: Low Risk   ? Difficulty of Paying Living Expenses: Not hard at all  ?Food Insecurity: No Food Insecurity  ? Worried About Charity fundraiser in the Last Year: Never true  ? Ran Out of Food in the Last Year: Never true  ?Transportation Needs: No Transportation Needs  ? Lack of Transportation (Medical): No  ? Lack of Transportation (Non-Medical): No  ?Physical Activity: Insufficiently Active  ? Days of Exercise per Week: 3 days  ? Minutes of Exercise per  Session: 30 min  ?Stress: No Stress Concern Present  ? Feeling of Stress : Only a little  ?Social Connections: Socially Integrated  ? Frequency of Communication with Friends and Family: More than three times a week  ? Frequency of Social Gatherings with Friends and Family: Twice a week  ? Attends Religious Services: More than 4 times per year  ? Active Member of Clubs or Organizations: Yes  ? Attends Archivist Meetings: 1 to 4 times per year  ? Marital Status: Married  ?Intimate Partner Violence: Not on file  ? ?Past Surgical History:  ?Procedure Laterality Date  ? BREAST BIOPSY Left 03/13/2019  ? BREAST LUMPECTOMY Left 04/03/2019  ? BREAST LUMPECTOMY WITH RADIOACTIVE SEED AND SENTINEL LYMPH NODE BIOPSY Left 04/03/2019  ? Procedure: LEFT BREAST LUMPECTOMY WITH RADIOACTIVE SEED AND SENTINEL LYMPH NODE MAPPING;  Surgeon: Erroll Luna, MD;  Location: [facility];  Service: General;  Laterality: Left;  ? CESAREAN SECTION    ? EYE SURGERY Right   ? cataract  ? EYE SURGERY Left   ? cataract  ? TONSILLECTOMY    ? ?Family History  ?Problem Relation Age of Onset  ? Heart attack Father   ? Congestive Heart Failure Father   ? Osteoporosis Maternal Aunt   ? Uterine cancer Maternal Aunt   ? Cancer Mother   ?     possible lung - spread   ? Early death Mother   ? Stroke Maternal Grandmother   ? Breast cancer Paternal Grandmother   ? Stroke Paternal Grandfather   ? ? ?Current Outpatient Medications:  ?  Black Pepper-Turmeric (TURMERIC COMPLEX/BLACK PEPPER PO), Take by mouth., Disp: , Rfl:  ?  Cholecalciferol (VITAMIN D3) 1000 UNITS CAPS, Take 2 capsules by mouth daily., Disp: , Rfl:  ?  cycloSPORINE (RESTASIS) 0.05 % ophthalmic emulsion, Place 1 drop into both eyes 2 (two) times daily., Disp: , Rfl:  ?  denosumab (PROLIA) 60 MG/ML SOSY injection, Inject 60 mg into the skin every 6 (six) months., Disp: , Rfl:  ?  escitalopram (LEXAPRO) 10 MG tablet, Take 1 tablet (10 mg total) by mouth daily., Disp: 90  tablet, Rfl: 3 ?  estradiol (ESTRACE) 0.1 MG/GM vaginal cream, INSERT 1 VAGINALLY AT BEDTIME, Disp: 127.5 g, Rfl: 2 ?  fluticasone (FLONASE) 50 MCG/ACT nasal spray, 1 SPRAY IN EACH NOSTRIL EVERY 12 HOURS, Disp: 48 g, Rfl: 1 ?  levothyroxine (SYNTHROID) 25 MCG tablet, TAKE 1 TABLET EVERY DAY BEFORE BREAKFAST, Disp: 90 tablet, Rfl: 3 ?  Multiple Vitamins-Minerals (WOMENS 50+ MULTI VITAMIN/MIN PO), Take by mouth., Disp: , Rfl:  ?  Omega-3 Fatty Acids (SUPER OMEGA-3 PO), Take by mouth., Disp: , Rfl:  ?  Polyethylene Glycol 3350 (MIRALAX PO), Take 1 packet by mouth daily as needed., Disp: , Rfl:  ?  predniSONE (STERAPRED UNI-PAK 21 TAB) 10 MG (21) TBPK tablet, Use as  directed on back of pill pack, Disp: 21 tablet, Rfl: 0 ?  rosuvastatin (CRESTOR) 5 MG tablet, TAKE (1) TABLET DAILY AS DIRECTED., Disp: 90 tablet, Rfl: 1 ?  Specialty Vitamins Products (ONE-A-DAY BONE STRENGTH) 500-28-100 MG-MG-UNIT TABS, Take 1 tablet by mouth 3 (three) times daily., Disp: , Rfl:  ?  tamoxifen (NOLVADEX) 20 MG tablet, Take 1 tablet (20 mg total) by mouth daily., Disp: 90 tablet, Rfl: 4 ?  triamcinolone ointment (KENALOG) 0.5 %, Apply 1 application. topically 2 (two) times daily., Disp: 30 g, Rfl: 0 ? ?Allergies  ?Allergen Reactions  ? Macrodantin   ? Sulfa Antibiotics   ? Wellbutrin [Bupropion Hcl]   ?  ? ?ROS: ?Review of Systems ?Pertinent items noted in HPI and remainder of comprehensive ROS otherwise negative.   ? ?Physical exam ?BP 119/60   Pulse 70   Temp 98.1 ?F (36.7 ?C)   Ht '5\' 5"'$  (1.651 m)   Wt 113 lb 6.4 oz (51.4 kg)   SpO2 97%   BMI 18.87 kg/m?  ?General appearance: alert, cooperative, appears stated age, and no distress ?Head: Normocephalic, without obvious abnormality, atraumatic ?Eyes: negative findings: lids and lashes normal, conjunctivae and sclerae normal, corneas clear, and pupils equal, round, reactive to light and accomodation ?Ears: normal TM's and external ear canals both ears ?Nose:  Minimally erythematous  nasal turbinates with clear nasal discharge ?Throat: lips, mucosa, and tongue normal; teeth and gums normal ?Neck: no adenopathy, no carotid bruit, supple, symmetrical, trachea midline, and thyroid not enlarged, symmetric,

## 2021-09-19 LAB — CMP14+EGFR
ALT: 15 IU/L (ref 0–32)
AST: 18 IU/L (ref 0–40)
Albumin/Globulin Ratio: 2.2 (ref 1.2–2.2)
Albumin: 4.2 g/dL (ref 3.7–4.7)
Alkaline Phosphatase: 38 IU/L — ABNORMAL LOW (ref 44–121)
BUN/Creatinine Ratio: 27 (ref 12–28)
BUN: 19 mg/dL (ref 8–27)
Bilirubin Total: 0.3 mg/dL (ref 0.0–1.2)
CO2: 28 mmol/L (ref 20–29)
Calcium: 9.4 mg/dL (ref 8.7–10.3)
Chloride: 100 mmol/L (ref 96–106)
Creatinine, Ser: 0.7 mg/dL (ref 0.57–1.00)
Globulin, Total: 1.9 g/dL (ref 1.5–4.5)
Glucose: 89 mg/dL (ref 70–99)
Potassium: 4.3 mmol/L (ref 3.5–5.2)
Sodium: 141 mmol/L (ref 134–144)
Total Protein: 6.1 g/dL (ref 6.0–8.5)
eGFR: 89 mL/min/{1.73_m2} (ref >59)

## 2021-09-19 LAB — CBC
Hematocrit: 45 % (ref 34.0–46.6)
Hemoglobin: 14.5 g/dL (ref 11.1–15.9)
MCH: 30.4 pg (ref 26.6–33.0)
MCHC: 32.2 g/dL (ref 31.5–35.7)
MCV: 94 fL (ref 79–97)
Platelets: 164 10*3/uL (ref 150–450)
RBC: 4.77 x10E6/uL (ref 3.77–5.28)
RDW: 14.5 % (ref 11.7–15.4)
WBC: 6.8 10*3/uL (ref 3.4–10.8)

## 2021-09-19 LAB — LIPID PANEL
Chol/HDL Ratio: 1.9 ratio (ref 0.0–4.4)
Cholesterol, Total: 154 mg/dL (ref 100–199)
HDL: 79 mg/dL (ref >39)
LDL Chol Calc (NIH): 55 mg/dL (ref 0–99)
Triglycerides: 113 mg/dL (ref 0–149)
VLDL Cholesterol Cal: 20 mg/dL (ref 5–40)

## 2021-09-19 LAB — T4, FREE: Free T4: 1.23 ng/dL (ref 0.82–1.77)

## 2021-09-19 LAB — TSH: TSH: 2.08 u[IU]/mL (ref 0.450–4.500)

## 2021-09-19 LAB — VITAMIN D 25 HYDROXY (VIT D DEFICIENCY, FRACTURES): Vit D, 25-Hydroxy: 55.2 ng/mL (ref 30.0–100.0)

## 2021-09-21 ENCOUNTER — Other Ambulatory Visit: Payer: Self-pay | Admitting: Family Medicine

## 2021-09-21 MED ORDER — LEVOTHYROXINE SODIUM 25 MCG PO TABS: 25.0000 ug | ORAL_TABLET | Freq: Every day | ORAL | 3 refills | Status: DC

## 2021-09-28 ENCOUNTER — Other Ambulatory Visit: Payer: Self-pay | Admitting: *Deleted

## 2021-09-28 DIAGNOSIS — Z170 Estrogen receptor positive status [ER+]: Secondary | ICD-10-CM

## 2021-09-29 ENCOUNTER — Encounter: Payer: Self-pay | Admitting: Hematology and Oncology

## 2021-09-29 ENCOUNTER — Other Ambulatory Visit: Payer: Self-pay

## 2021-09-29 ENCOUNTER — Inpatient Hospital Stay (HOSPITAL_BASED_OUTPATIENT_CLINIC_OR_DEPARTMENT_OTHER): Payer: Medicare Other | Admitting: Hematology and Oncology

## 2021-09-29 ENCOUNTER — Inpatient Hospital Stay: Payer: Medicare Other | Attending: Hematology and Oncology

## 2021-09-29 VITALS — BP 125/70 | HR 63 | Temp 97.9°F | Resp 16 | Ht 65.0 in | Wt 113.8 lb

## 2021-09-29 DIAGNOSIS — Z923 Personal history of irradiation: Secondary | ICD-10-CM | POA: Insufficient documentation

## 2021-09-29 DIAGNOSIS — Z79810 Long term (current) use of selective estrogen receptor modulators (SERMs): Secondary | ICD-10-CM | POA: Insufficient documentation

## 2021-09-29 DIAGNOSIS — C50212 Malignant neoplasm of upper-inner quadrant of left female breast: Secondary | ICD-10-CM | POA: Diagnosis not present

## 2021-09-29 DIAGNOSIS — Z170 Estrogen receptor positive status [ER+]: Secondary | ICD-10-CM | POA: Insufficient documentation

## 2021-09-29 DIAGNOSIS — Z79899 Other long term (current) drug therapy: Secondary | ICD-10-CM | POA: Diagnosis not present

## 2021-09-29 DIAGNOSIS — Z7952 Long term (current) use of systemic steroids: Secondary | ICD-10-CM | POA: Insufficient documentation

## 2021-09-29 DIAGNOSIS — M8580 Other specified disorders of bone density and structure, unspecified site: Secondary | ICD-10-CM | POA: Insufficient documentation

## 2021-09-29 LAB — CMP (CANCER CENTER ONLY)
ALT: 14 U/L (ref 0–44)
AST: 18 U/L (ref 15–41)
Albumin: 3.8 g/dL (ref 3.5–5.0)
Alkaline Phosphatase: 27 U/L — ABNORMAL LOW (ref 38–126)
Anion gap: 3 — ABNORMAL LOW (ref 5–15)
BUN: 17 mg/dL (ref 8–23)
CO2: 33 mmol/L — ABNORMAL HIGH (ref 22–32)
Calcium: 8.8 mg/dL — ABNORMAL LOW (ref 8.9–10.3)
Chloride: 105 mmol/L (ref 98–111)
Creatinine: 0.74 mg/dL (ref 0.44–1.00)
GFR, Estimated: >60 mL/min (ref >60)
Glucose, Bld: 90 mg/dL (ref 70–99)
Potassium: 4.2 mmol/L (ref 3.5–5.1)
Sodium: 141 mmol/L (ref 135–145)
Total Bilirubin: 0.4 mg/dL (ref 0.3–1.2)
Total Protein: 6.2 g/dL — ABNORMAL LOW (ref 6.5–8.1)

## 2021-09-29 LAB — CBC WITH DIFFERENTIAL (CANCER CENTER ONLY)
Abs Immature Granulocytes: 0.03 10*3/uL (ref 0.00–0.07)
Basophils Absolute: 0 10*3/uL (ref 0.0–0.1)
Basophils Relative: 1 %
Eosinophils Absolute: 0.2 10*3/uL (ref 0.0–0.5)
Eosinophils Relative: 3 %
HCT: 42.1 % (ref 36.0–46.0)
Hemoglobin: 14 g/dL (ref 12.0–15.0)
Immature Granulocytes: 1 %
Lymphocytes Relative: 16 %
Lymphs Abs: 1 10*3/uL (ref 0.7–4.0)
MCH: 31.1 pg (ref 26.0–34.0)
MCHC: 33.3 g/dL (ref 30.0–36.0)
MCV: 93.6 fL (ref 80.0–100.0)
Monocytes Absolute: 0.8 10*3/uL (ref 0.1–1.0)
Monocytes Relative: 13 %
Neutro Abs: 4 10*3/uL (ref 1.7–7.7)
Neutrophils Relative %: 66 %
Platelet Count: 148 10*3/uL — ABNORMAL LOW (ref 150–400)
RBC: 4.5 MIL/uL (ref 3.87–5.11)
RDW: 15.2 % (ref 11.5–15.5)
WBC Count: 6 10*3/uL (ref 4.0–10.5)
nRBC: 0 % (ref 0.0–0.2)

## 2021-09-29 MED ORDER — TAMOXIFEN CITRATE 20 MG PO TABS: 20.0000 mg | ORAL_TABLET | Freq: Every day | ORAL | 4 refills | Status: DC

## 2021-09-29 NOTE — Progress Notes (Signed)
? ?Kayla Randolph  ?[phone] [phone]  ? ? ? ?ID: Kayla Randolph DOB: 10/27/1945  MR#: [id number]  VEL#:[id number] ? ?Patient Care Team: ?Kayla Norlander, DO as PCP - General (Family Medicine) ?Kayla Essex, MD as Consulting Physician (Gastroenterology) ?Kayla Monarch, MD as Consulting Physician (Dermatology) ?Kayla Kaufmann, RN as Oncology Nurse Navigator ?Kayla Germany, RN as Oncology Nurse Navigator ?Kayla Randolph, Kayla Dad, MD (Inactive) as Consulting Physician (Oncology) ?Kayla Guys, MD as Consulting Physician (Ophthalmology) ?Kayla Rudd, MD as Consulting Physician (Radiation Oncology) ?Kayla Luna, MD as Consulting Physician (General Surgery) ?Kayla Pike, MD ?OTHER MD: ? ?CHIEF COMPLAINT: estrogen receptor positive breast cancer ? ?CURRENT TREATMENT: Tamoxifen ? ? ?INTERVAL HISTORY: ?Kayla Randolph returns today for follow up of her estrogen receptor positive breast cancer.  ? ?She started tamoxifen on 07/02/2019.  Last mammogram in January 2023, no evidence of malignancy.  Breast density category C. ?She continues to have random episodes of itching, she was prescribed some Kenalog and prednisone for management of pruritus.  She has not otherwise noticed any changes in her breast.  Rest of the pertinent 10 point ROS reviewed and negative ? ? ? COVID 19 VACCINATION STATUS: Pfizer x3, and Moderna booster 11/2020 ? ? ?HISTORY OF CURRENT ILLNESS:    ?From the original intake note:  ? ?"Kayla Randolph" had routine screening mammography on 03/02/2019 showing a possible abnormality in the left breast. She underwent left diagnostic mammography with tomography and left breast ultrasonography at [facility] on 03/09/2019 showing: breast density category C; suspicious 1.1 cm left breast mass at 3:30 with associated punctate calcifications; no left axillary adenopathy. ? ?Accordingly on 03/13/2019 she proceeded to biopsy of the left breast area in question. The pathology from this  procedure ([id number]) showed: invasive ductal carcinoma, grade 2. Prognostic indicators significant for: estrogen receptor, 100% positive and progesterone receptor, 100% positive, both with strong staining intensity. Proliferation marker Ki67 at 30%. HER2 negative by immunohistochemistry (1+). ? ?The patient's subsequent history is as detailed below. ? ? ?PAST MEDICAL HISTORY: ?Past Medical History:  ?Diagnosis Date  ? Allergy   ? Anxiety   ? Atypical mole 11/23/2002  ? moderate-right lower back(WS)  ? Basal cell carcinoma 02/06/2019  ? nod-left nare (MOHS)  ? Breast cancer ([place])   ? Cataract   ? Depression   ? History of colon polyps   ? Hyperlipidemia   ? Hypothyroidism   ? Interstitial cystitis   ? History of  ? Osteopenia   ? Personal history of radiation therapy   ? S/P lumpectomy, left breast 04/03/2019  ? Skin cancer   ? removed from left nostril  ? Vitamin D deficiency   ? ? ?PAST SURGICAL HISTORY: ?Past Surgical History:  ?Procedure Laterality Date  ? BREAST BIOPSY Left 03/13/2019  ? BREAST LUMPECTOMY Left 04/03/2019  ? BREAST LUMPECTOMY WITH RADIOACTIVE SEED AND SENTINEL LYMPH NODE BIOPSY Left 04/03/2019  ? Procedure: LEFT BREAST LUMPECTOMY WITH RADIOACTIVE SEED AND SENTINEL LYMPH NODE MAPPING;  Surgeon: Kayla Luna, MD;  Location: [facility];  Service: General;  Laterality: Left;  ? CESAREAN SECTION    ? EYE SURGERY Right   ? cataract  ? EYE SURGERY Left   ? cataract  ? TONSILLECTOMY    ? ? ?FAMILY HISTORY: ?Family History  ?Problem Relation Age of Onset  ? Heart attack Father   ? Congestive Heart Failure Father   ? Osteoporosis Maternal Aunt   ? Uterine cancer Maternal Aunt   ? Cancer  Mother   ?     possible lung - spread   ? Early death Mother   ? Stroke Maternal Grandmother   ? Breast cancer Paternal Grandmother   ? Stroke Paternal Grandfather   ?Patient's father was 20 years old when he died from congestive heart failure. Patient's mother died from cancer, type unknown to the  patient, at age 54. The patient denies a family hx of ovarian cancer. She reports a maternal aunt was diagnosed with uterine cancer (age unsure), and her paternal grandmother with breast cancer at a very young age (exact age unsure).  The patient has no siblings. ? ? ?GYNECOLOGIC HISTORY:  ?No LMP recorded. Patient is postmenopausal. ?Menarche: [age] years old ?Age at first live birth: 76 years old ?GX P 2 ?LMP age [age] ?Contraceptive: yes, used pills on and off from age [age] without issue. ?HRT: yes, for about 5 years, until age 81 or 58.  ?Hysterectomy? no ?BSO? no ? ? 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  Husband Kayla Randolph is retired Engineer, building services in the [employer].. She lives at home with Kayla Randolph and 2 cats. Daughter Kayla Randolph, age 65, lives in [place] as a homemaker. Son Kayla Randolph, age 68, lives in [place], [place] as a Engineer, maintenance (IT).  The patient has 4 grandchildren one of them attending [employer].  She attends [employer] in [place] ?  ? ADVANCED DIRECTIVES: In the absence of any documentation to the contrary, the patient's spouse is their HCPOA. ? ? ?HEALTH MAINTENANCE: ?Social History  ? ?Tobacco Use  ? Smoking status: Never  ? Smokeless tobacco: Never  ?Vaping Use  ? Vaping Use: Never used  ?Substance Use Topics  ? Alcohol use: Yes  ?  Alcohol/week: 7.0 standard drinks  ?  Types: 7 Standard drinks or equivalent per week  ?  Comment: wine nightly  ? Drug use: No  ? ? ? Colonoscopy: 09/2011 ? PAP: 2019 ? Bone density: 06/2018, -2.0 ?  ?Allergies  ?Allergen Reactions  ? Macrodantin   ? Sulfa Antibiotics   ? Wellbutrin [Bupropion Hcl]   ? ? ?Current Outpatient Medications  ?Medication Sig Dispense Refill  ? Black Pepper-Turmeric (TURMERIC COMPLEX/BLACK PEPPER PO) Take by mouth.    ? Cholecalciferol (VITAMIN D3) 1000 UNITS CAPS Take 2 capsules by mouth daily.    ? cycloSPORINE (RESTASIS) 0.05 % ophthalmic emulsion Place 1 drop into both eyes 2  (two) times daily.    ? denosumab (PROLIA) 60 MG/ML SOSY injection Inject 60 mg into the skin every 6 (six) months.    ? escitalopram (LEXAPRO) 10 MG tablet Take 1 tablet (10 mg total) by mouth daily. 90 tablet 3  ? estradiol (ESTRACE) 0.1 MG/GM vaginal cream INSERT 1 VAGINALLY AT BEDTIME 127.5 g 2  ? fluticasone (FLONASE) 50 MCG/ACT nasal spray 1 SPRAY IN EACH NOSTRIL EVERY 12 HOURS 48 g 1  ? levocetirizine (XYZAL) 5 MG tablet Take 1 tablet (5 mg total) by mouth at bedtime as needed for allergies (rash/itching). 90 tablet 3  ? levothyroxine (SYNTHROID) 25 MCG tablet Take 1 tablet (25 mcg total) by mouth daily before breakfast. 90 tablet 3  ? Multiple Vitamins-Minerals (WOMENS 50+ MULTI VITAMIN/MIN PO) Take by mouth.    ? Omega-3 Fatty Acids (SUPER OMEGA-3 PO) Take by mouth.    ? Polyethylene Glycol 3350 (MIRALAX PO) Take 1 packet by mouth daily as needed.    ? predniSONE (STERAPRED UNI-PAK 21 TAB)  10 MG (21) TBPK tablet Use as directed on back of pill pack 21 tablet 0  ? rosuvastatin (CRESTOR) 5 MG tablet TAKE (1) TABLET DAILY AS DIRECTED. 90 tablet 1  ? Specialty Vitamins Products (ONE-A-DAY BONE STRENGTH) 500-28-100 MG-MG-UNIT TABS Take 1 tablet by mouth 3 (three) times daily.    ? tamoxifen (NOLVADEX) 20 MG tablet Take 1 tablet (20 mg total) by mouth daily. 90 tablet 4  ? triamcinolone ointment (KENALOG) 0.5 % Apply 1 application. topically 2 (two) times daily. 30 g 0  ? ?No current facility-administered medications for this visit.  ? ? ?OBJECTIVE: white woman who appears younger than stated age ? ?Vitals:  ? 09/29/21 1137  ?BP: 125/70  ?Pulse: 63  ?Resp: 16  ?Temp: 97.9 ?F (36.6 ?C)  ?SpO2: 100%  ? ? ?   Body mass index is 18.94 kg/m?.   ?Wt Readings from Last 3 Encounters:  ?09/29/21 113 lb 12.8 oz (51.6 kg)  ?09/18/21 113 lb 6.4 oz (51.4 kg)  ?09/10/21 114 lb 4 oz (51.8 kg)  ? ?  ECOG FS:1 - Symptomatic but completely ambulatory ? ?Physical Exam ?Constitutional:   ?   Appearance: Normal appearance.  ?Chest:   ?   Comments: Bilateral breast examined.  No palpable masses.  Postradiation changes noted in the left breast.  Postop seroma felt in the left axilla ?Musculoskeletal:  ?   Cervical back: Normal range of motion

## 2021-09-29 NOTE — Progress Notes (Deleted)
? ?[facility]  ?Telephone:([phone]) [phone] [phone]  ? ? ? ?ID: Kayla Randolph DOB: 1945-06-08  MR#: [id number]  [name] ? ?Patient Care Team: ?Janora Norlander, DO as PCP - General (Family Medicine) ?Clarene Essex, MD as Consulting Physician (Gastroenterology) ?Lavonna Monarch, MD as Consulting Physician (Dermatology) ?Mauro Kaufmann, RN as Oncology Nurse Navigator ?Rockwell Germany, RN as Oncology Nurse Navigator ?Magrinat, Virgie Dad, MD (Inactive) as Consulting Physician (Oncology) ?Rutherford Guys, MD as Consulting Physician (Ophthalmology) ?Kyung Rudd, MD as Consulting Physician (Radiation Oncology) ?Erroll Luna, MD as Consulting Physician (General Surgery) ?Benay Pike, MD ?OTHER MD: ? ?CHIEF COMPLAINT: estrogen receptor positive breast cancer ? ?CURRENT TREATMENT: Tamoxifen ? ? ?INTERVAL HISTORY: ? ?Kayla Randolph returns today for follow up of her estrogen receptor positive breast cancer.  ? ?She started tamoxifen on 07/02/2019.  She tolerates this with no side effects that she is aware of and in particular hot flashes and vaginal wetness are not issues. ? ?Since her last visit, she underwent bilateral diagnostic mammography with tomography at [facility] on 05/13/2020 showing: breast density category C; no evidence of malignancy in either breast.  ? ?She also underwent bone density screening on 06/18/2020 showing a T-score of -2.0, which is considered osteopenic. This is stable from prior in 06/2018. ? ? ?REVIEW OF SYSTEMS: ?Kayla Randolph is not exercising regularly.  She tells me she and her husband are thinking of joining an exercise place in [place].  She does have about 2 stories in her house and is up and down the stairs all day she says.  A detailed review of systems was otherwise stable. ? ? COVID 19 VACCINATION STATUS: Pfizer x3, and Moderna booster 11/2020 ? ? ?HISTORY OF CURRENT ILLNESS:    ?From the original intake note:  ? ?"Kayla Randolph" had routine screening mammography on  03/02/2019 showing a possible abnormality in the left breast. She underwent left diagnostic mammography with tomography and left breast ultrasonography at [facility] on 03/09/2019 showing: breast density category C; suspicious 1.1 cm left breast mass at 3:30 with associated punctate calcifications; no left axillary adenopathy. ? ?Accordingly on 03/13/2019 she proceeded to biopsy of the left breast area in question. The pathology from this procedure ([id number]) showed: invasive ductal carcinoma, grade 2. Prognostic indicators significant for: estrogen receptor, 100% positive and progesterone receptor, 100% positive, both with strong staining intensity. Proliferation marker Ki67 at 30%. HER2 negative by immunohistochemistry (1+). ? ?The patient's subsequent history is as detailed below. ? ? ?PAST MEDICAL HISTORY: ?Past Medical History:  ?Diagnosis Date  ? Allergy   ? Anxiety   ? Atypical mole 11/23/2002  ? moderate-right lower back(WS)  ? Basal cell carcinoma 02/06/2019  ? nod-left nare (MOHS)  ? Breast cancer ([facility])   ? Cataract   ? Depression   ? History of colon polyps   ? Hyperlipidemia   ? Hypothyroidism   ? Interstitial cystitis   ? History of  ? Osteopenia   ? Personal history of radiation therapy   ? S/P lumpectomy, left breast 04/03/2019  ? Skin cancer   ? removed from left nostril  ? Vitamin D deficiency   ? ? ?PAST SURGICAL HISTORY: ?Past Surgical History:  ?Procedure Laterality Date  ? BREAST BIOPSY Left 03/13/2019  ? BREAST LUMPECTOMY Left 04/03/2019  ? BREAST LUMPECTOMY WITH RADIOACTIVE SEED AND SENTINEL LYMPH NODE BIOPSY Left 04/03/2019  ? Procedure: LEFT BREAST LUMPECTOMY WITH RADIOACTIVE SEED AND SENTINEL LYMPH NODE MAPPING;  Surgeon: Erroll Luna, MD;  Location:  [facility];  Service: General;  Laterality: Left;  ? CESAREAN SECTION    ? EYE SURGERY Right   ? cataract  ? EYE SURGERY Left   ? cataract  ? TONSILLECTOMY    ? ? ?FAMILY HISTORY: ?Family History  ?Problem Relation  Age of Onset  ? Heart attack Father   ? Congestive Heart Failure Father   ? Osteoporosis Maternal Aunt   ? Uterine cancer Maternal Aunt   ? Cancer Mother   ?     possible lung - spread   ? Early death Mother   ? Stroke Maternal Grandmother   ? Breast cancer Paternal Grandmother   ? Stroke Paternal Grandfather   ?Patient's father was 74 years old when he died from congestive heart failure. Patient's mother died from cancer, type unknown to the patient, at age 50. The patient denies a family hx of ovarian cancer. She reports a maternal aunt was diagnosed with uterine cancer (age unsure), and her paternal grandmother with breast cancer at a very young age (exact age unsure).  The patient has no siblings. ? ? ?GYNECOLOGIC HISTORY:  ?No LMP recorded. Patient is postmenopausal. ?Menarche: [age] years old ?Age at first live birth: 76 years old ?GX P 2 ?LMP age [age] ?Contraceptive: yes, used pills on and off from age [age] without issue. ?HRT: yes, for about 5 years, until age 30 or 2.  ?Hysterectomy? no ?BSO? no ? ? 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  Husband Ed is retired Engineer, building services in the [employer].. She lives at home with Ed and 2 cats. Daughter Santiago Glad, age 29, lives in [place] as a homemaker. Son Trenton, age 71, lives in [place], [place] as a Engineer, maintenance (IT).  The patient has 4 grandchildren one of them attending [employer].  She attends [employer] in [place] ?  ? ADVANCED DIRECTIVES: In the absence of any documentation to the contrary, the patient's spouse is their HCPOA. ? ? ?HEALTH MAINTENANCE: ?Social History  ? ?Tobacco Use  ? Smoking status: Never  ? Smokeless tobacco: Never  ?Vaping Use  ? Vaping Use: Never used  ?Substance Use Topics  ? Alcohol use: Yes  ?  Alcohol/week: 7.0 standard drinks  ?  Types: 7 Standard drinks or equivalent per week  ?  Comment: wine nightly  ? Drug use: No  ? ? ? Colonoscopy: 09/2011 ? PAP:  2019 ? Bone density: 06/2018, -2.0 ?  ?Allergies  ?Allergen Reactions  ? Macrodantin   ? Sulfa Antibiotics   ? Wellbutrin [Bupropion Hcl]   ? ? ?Current Outpatient Medications  ?Medication Sig Dispense Refill  ? Black Pepper-Turmeric (TURMERIC COMPLEX/BLACK PEPPER PO) Take by mouth.    ? Cholecalciferol (VITAMIN D3) 1000 UNITS CAPS Take 2 capsules by mouth daily.    ? cycloSPORINE (RESTASIS) 0.05 % ophthalmic emulsion Place 1 drop into both eyes 2 (two) times daily.    ? denosumab (PROLIA) 60 MG/ML SOSY injection Inject 60 mg into the skin every 6 (six) months.    ? escitalopram (LEXAPRO) 10 MG tablet Take 1 tablet (10 mg total) by mouth daily. 90 tablet 3  ? estradiol (ESTRACE) 0.1 MG/GM vaginal cream INSERT 1 VAGINALLY AT BEDTIME 127.5 g 2  ? fluticasone (FLONASE) 50 MCG/ACT nasal spray 1 SPRAY IN EACH NOSTRIL EVERY 12 HOURS 48 g 1  ? levocetirizine (XYZAL) 5 MG tablet Take 1 tablet (5 mg total) by mouth at  bedtime as needed for allergies (rash/itching). 90 tablet 3  ? levothyroxine (SYNTHROID) 25 MCG tablet Take 1 tablet (25 mcg total) by mouth daily before breakfast. 90 tablet 3  ? Multiple Vitamins-Minerals (WOMENS 50+ MULTI VITAMIN/MIN PO) Take by mouth.    ? Omega-3 Fatty Acids (SUPER OMEGA-3 PO) Take by mouth.    ? Polyethylene Glycol 3350 (MIRALAX PO) Take 1 packet by mouth daily as needed.    ? predniSONE (STERAPRED UNI-PAK 21 TAB) 10 MG (21) TBPK tablet Use as directed on back of pill pack 21 tablet 0  ? rosuvastatin (CRESTOR) 5 MG tablet TAKE (1) TABLET DAILY AS DIRECTED. 90 tablet 1  ? Specialty Vitamins Products (ONE-A-DAY BONE STRENGTH) 500-28-100 MG-MG-UNIT TABS Take 1 tablet by mouth 3 (three) times daily.    ? tamoxifen (NOLVADEX) 20 MG tablet Take 1 tablet (20 mg total) by mouth daily. 90 tablet 4  ? triamcinolone ointment (KENALOG) 0.5 % Apply 1 application. topically 2 (two) times daily. 30 g 0  ? ?No current facility-administered medications for this visit.  ? ? ?OBJECTIVE: white woman who  appears younger than stated age ? ?Vitals:  ? 09/29/21 1137  ?BP: 125/70  ?Pulse: 63  ?Resp: 16  ?Temp: 97.9 ?F (36.6 ?C)  ?SpO2: 100%  ? ?   Body mass index is 18.94 kg/m?.   ?Wt Readings from Last 3 Encounters:  ?05/1

## 2021-10-15 ENCOUNTER — Encounter: Payer: Self-pay | Admitting: Nurse Practitioner

## 2021-10-15 ENCOUNTER — Ambulatory Visit (INDEPENDENT_AMBULATORY_CARE_PROVIDER_SITE_OTHER): Payer: Medicare Other | Admitting: Nurse Practitioner

## 2021-10-15 VITALS — BP 116/67 | HR 64 | Temp 97.7°F | Resp 20 | Ht 65.0 in | Wt 112.0 lb

## 2021-10-15 DIAGNOSIS — S40021A Contusion of right upper arm, initial encounter: Secondary | ICD-10-CM | POA: Diagnosis not present

## 2021-10-15 NOTE — Patient Instructions (Signed)
Hematoma A hematoma is a collection of blood. A hematoma can happen: Under the skin. In an organ. In a body space. In a joint space. In other tissues. The blood can thicken (clot) to form a lump that you can see and feel. The lump is often hard and may become sore and tender. The lump can be very small or very big. Most hematomas get better in a few days to weeks. However, some hematomas may be serious and need medical care. What are the causes? This condition is caused by: An injury. Blood that leaks under the skin. Problems from surgeries. Medical conditions that cause bleeding or bruising. What increases the risk? You are more likely to develop this condition if: You are an older adult. You use medicines that thin your blood. What are the signs or symptoms? Symptoms depend on where the hematoma is in your body. If the hematoma is under the skin, there is: A firm lump on the body. Pain and tenderness in the area. Bruising. The skin above the lump may be blue, dark blue, purple-red, or yellowish. If the hematoma is deep in the tissues or body spaces, there may be: Blood in the stomach. This may cause pain in the belly (abdomen), weakness, passing out (fainting), and shortness of breath. Blood in the head. This may cause a headache, weakness, trouble speaking or understanding speech, or passing out. How is this diagnosed? This condition is diagnosed based on: Your medical history. A physical exam. Imaging tests, such as ultrasound or CT scan. Blood tests. How is this treated? Treatment depends on the cause, size, and location of the hematoma. Treatment may include: Doing nothing. Many hematomas go away on their own without treatment. Surgery or close monitoring. This may be needed for large hematomas or hematomas that affect the body's organs. Medicines. These may be given if a medical condition caused the hematoma. Follow these instructions at home: Managing pain, stiffness,  and swelling  If told, put ice on the area. Put ice in a plastic bag. Place a towel between your skin and the bag. Leave the ice on for 20 minutes, 2-3 times a day for the first two days. If told, put heat on the affected area after putting ice on the area for two days. Use the heat source that your doctor tells you to use. This could be a moist heat pack or a heating pad. To do this: Place a towel between your skin and the heat source. Leave the heat on for 20-30 minutes. Remove the heat if your skin turns bright red. This is very important if you are unable to feel pain, heat, or cold. You may have a greater risk of getting burned. Raise (elevate) the affected area above the level of your heart while you are sitting or lying down. Wrap the affected area with an elastic bandage, if told by your doctor. Do not wrap the bandage too tightly. If your hematoma is on a leg or foot and is painful, your doctor may give you crutches. Use them as told by your doctor. General instructions Take over-the-counter and prescription medicines only as told by your doctor. Keep all follow-up visits as told by your doctor. This is important. Contact a doctor if: You have a fever. The swelling or bruising gets worse. You start to get more hematomas. Get help right away if: Your pain gets worse. Your pain is not getting better with medicine. Your skin over the hematoma breaks or starts to bleed.   Your hematoma is in your chest or belly and you: Pass out. Feel weak. Become short of breath. You have a hematoma on your scalp that is caused by a fall or injury, and you: Have a headache that gets worse. Have trouble speaking or understanding speech. Become less alert or you pass out. Summary A hematoma is a collection of blood in any part of your body. Most hematomas get better on their own in a few days to weeks. Some may need medical care. Follow instructions from your doctor about how to care for your  hematoma. Contact a doctor if the swelling or bruising gets worse, or if you are short of breath. This information is not intended to replace advice given to you by your health care provider. Make sure you discuss any questions you have with your health care provider. Document Revised: 02/26/2021 Document Reviewed: 02/26/2021 Elsevier Patient Education  2023 Elsevier Inc.  

## 2021-10-15 NOTE — Progress Notes (Addendum)
   Subjective:    Patient ID: Kayla Randolph, female    DOB: [birth year], 76 y.o.   MRN: [id number]   Chief Complaint: hematoma.  HPI [facility]. She has a hematoma where needle was inserted. Sore to touch.    Review of Systems  Constitutional:  Negative for diaphoresis.  Eyes:  Negative for pain.  Respiratory:  Negative for shortness of breath.   Cardiovascular:  Negative for chest pain, palpitations and leg swelling.  Gastrointestinal:  Negative for abdominal pain.  Endocrine: Negative for polydipsia.  Skin:  Negative for rash.  Neurological:  Negative for dizziness, weakness and headaches.  Hematological:  Does not bruise/bleed easily.  All other systems reviewed and are negative.     Objective:   Physical Exam Vitals reviewed.  Constitutional:      Appearance: Normal appearance.  Cardiovascular:     Rate and Rhythm: Normal rate and regular rhythm.     Heart sounds: Normal heart sounds.     Comments: Bil hands cool to touch Palpable radial pulse Pulmonary:     Effort: Pulmonary effort is normal.     Breath sounds: Normal breath sounds.  Skin:    General: Skin is warm.     Comments: large hematoma with enlarged antecubital vein right arm  Neurological:     General: No focal deficit present.     Mental Status: She is alert and oriented to person, place, and time.  Psychiatric:        Mood and Affect: Mood normal.        Behavior: Behavior normal.   BP 116/67   Pulse 64   Temp 97.7 F (36.5 C) (Temporal)   Resp 20   Ht '5\' 5"'$  (1.651 m)   Wt 112 lb (50.8 kg)   BMI 18.64 kg/m          Assessment & Plan:   Elon Spanner in today with chief complaint of bruise to right elbow (Gave blood 2 days ago/)   1. Contusion of right upper extremity, initial encounter Ice Elevate RTO prn    The above assessment and management plan was discussed with the patient. The patient verbalized understanding of and has agreed to the  management plan. Patient is aware to call the clinic if symptoms persist or worsen. Patient is aware when to return to the clinic for a follow-up visit. Patient educated on when it is appropriate to go to the emergency department.   Mary-Margaret Hassell Done, FNP

## 2021-11-11 ENCOUNTER — Encounter: Payer: Self-pay | Admitting: Dermatology

## 2021-11-11 ENCOUNTER — Ambulatory Visit: Payer: Medicare Other | Admitting: Dermatology

## 2021-11-11 DIAGNOSIS — L97819 Non-pressure chronic ulcer of other part of right lower leg with unspecified severity: Secondary | ICD-10-CM | POA: Diagnosis not present

## 2021-11-11 DIAGNOSIS — L089 Local infection of the skin and subcutaneous tissue, unspecified: Secondary | ICD-10-CM

## 2021-11-11 DIAGNOSIS — L97919 Non-pressure chronic ulcer of unspecified part of right lower leg with unspecified severity: Secondary | ICD-10-CM | POA: Diagnosis not present

## 2021-11-11 MED ORDER — DOXYCYCLINE HYCLATE 100 MG PO TABS: 100.0000 mg | ORAL_TABLET | Freq: Two times a day (BID) | ORAL | 0 refills | Status: DC

## 2021-11-17 LAB — ANAEROBIC AND AEROBIC CULTURE
MICRO NUMBER:: 13583671
MICRO NUMBER:: 13583672
SPECIMEN QUALITY:: ADEQUATE
SPECIMEN QUALITY:: ADEQUATE

## 2021-11-18 ENCOUNTER — Telehealth: Payer: Self-pay

## 2021-11-18 NOTE — Telephone Encounter (Signed)
Lab to patient and she is aware of Dr Delma Officer message

## 2021-11-18 NOTE — Telephone Encounter (Signed)
-----   Message from Lavonna Monarch, MD sent at 11/17/2021 12:34 PM EDT ----- Please let patient know that the culture did show heavy growth of common staph bacteria which is sensitive to the antibiotic she is on.  It is impossible to say if the bacteria would just a secondary growth on top of an ulceration.  She should continue and finish the entire prescription and keep her follow-up.

## 2021-11-30 ENCOUNTER — Other Ambulatory Visit: Payer: Self-pay | Admitting: Family Medicine

## 2021-12-03 DIAGNOSIS — R195 Other fecal abnormalities: Secondary | ICD-10-CM | POA: Insufficient documentation

## 2021-12-04 ENCOUNTER — Ambulatory Visit (INDEPENDENT_AMBULATORY_CARE_PROVIDER_SITE_OTHER): Payer: Medicare Other

## 2021-12-04 VITALS — Wt 112.0 lb

## 2021-12-04 DIAGNOSIS — Z0000 Encounter for general adult medical examination without abnormal findings: Secondary | ICD-10-CM

## 2021-12-04 NOTE — Progress Notes (Signed)
Subjective:   Kayla Randolph is a 76 y.o. female who presents for Medicare Annual (Subsequent) preventive examination.  Virtual Visit via Telephone Note  I connected with  Kayla Randolph on 12/04/21 at  9:45 AM EDT by telephone and verified that I am speaking with the correct person using two identifiers.  Location: Patient: Home Provider: [facility] Persons participating in the virtual visit: patient/Nurse Health Advisor   I discussed the limitations, risks, security and privacy concerns of performing an evaluation and management service by telephone and the availability of in person appointments. The patient expressed understanding and agreed to proceed.  Interactive audio and video telecommunications were attempted between this nurse and patient, however failed, due to patient having technical difficulties OR patient did not have access to video capability.  We continued and completed visit with audio only.  Some vital signs may be absent or patient reported.   Yasuo Phimmasone E Chrisoula Zegarra, LPN   Review of Systems     Cardiac Risk Factors include: advanced age (>16mn, >>33women);dyslipidemia;sedentary lifestyle     Objective:    Today's Vitals   12/04/21 0948  Weight: 112 lb (50.8 kg)   Body mass index is 18.64 kg/m.     12/04/2021    9:54 AM 12/03/2020    9:21 AM 12/03/2019   10:37 AM 10/03/2019    4:22 PM 04/25/2019    9:02 AM 04/03/2019    9:57 AM 03/28/2019   10:32 AM  Advanced Directives  Does Patient Have a Medical Advance Directive? Yes Yes Yes Yes No;Yes Yes Yes  Type of AParamedicof AFreeburgLiving will HBusseyLiving will HBrandonLiving will  Living will;Healthcare Power of AMillportLiving will HJulianLiving will  Does patient want to make changes to medical advance directive?   No - Patient declined Yes (MAU/Ambulatory/Procedural Areas - Information given) No -  Patient declined No - Patient declined No - Patient declined  Copy of HHanlontownin Chart? No - copy requested No - copy requested No - copy requested  No - copy requested No - copy requested   Would patient like information on creating a medical advance directive?     No - Patient declined      Current Medications (verified) Outpatient Encounter Medications as of 12/04/2021  Medication Sig   Black Pepper-Turmeric (TURMERIC COMPLEX/BLACK PEPPER PO) Take by mouth.   Cholecalciferol (VITAMIN D3) 1000 UNITS CAPS Take 2 capsules by mouth daily.   cycloSPORINE (RESTASIS) 0.05 % ophthalmic emulsion Place 1 drop into both eyes 2 (two) times daily.   denosumab (PROLIA) 60 MG/ML SOSY injection Inject 60 mg into the skin every 6 (six) months.   escitalopram (LEXAPRO) 10 MG tablet Take 1 tablet (10 mg total) by mouth daily.   estradiol (ESTRACE) 0.1 MG/GM vaginal cream INSERT 1 VAGINALLY AT BEDTIME   fluticasone (FLONASE) 50 MCG/ACT nasal spray 1 SPRAY IN EACH NOSTRIL EVERY 12 HOURS   levothyroxine (SYNTHROID) 25 MCG tablet Take 1 tablet (25 mcg total) by mouth daily before breakfast.   Multiple Vitamins-Minerals (WOMENS 50+ MULTI VITAMIN/MIN PO) Take by mouth.   Omega-3 Fatty Acids (SUPER OMEGA-3 PO) Take by mouth.   Polyethylene Glycol 3350 (MIRALAX PO) Take 1 packet by mouth daily as needed.   rosuvastatin (CRESTOR) 5 MG tablet TAKE (1) TABLET DAILY AS DIRECTED.   Specialty Vitamins Products (ONE-A-DAY BONE STRENGTH) 500-28-100 MG-MG-UNIT TABS Take 1 tablet by mouth  3 (three) times daily.   tamoxifen (NOLVADEX) 20 MG tablet Take 1 tablet (20 mg total) by mouth daily.   [DISCONTINUED] doxycycline (VIBRA-TABS) 100 MG tablet Take 1 tablet (100 mg total) by mouth in the morning and at bedtime.   No facility-administered encounter medications on file as of 12/04/2021.    Allergies (verified) Macrodantin, Sulfa antibiotics, and Wellbutrin [bupropion hcl]   History: Past Medical  History:  Diagnosis Date   Allergy    Anxiety    Atypical mole 11/23/2002   moderate-right lower back([facility])   Basal cell carcinoma 02/06/2019   nod-left nare (MOHS)   Breast cancer ([place])    Cataract    Depression    History of colon polyps    Hyperlipidemia    Hypothyroidism    Interstitial cystitis    History of   Osteopenia    Personal history of radiation therapy    S/P lumpectomy, left breast 04/03/2019   Skin cancer    removed from left nostril   Vitamin D deficiency    Past Surgical History:  Procedure Laterality Date   BREAST BIOPSY Left 03/13/2019   BREAST LUMPECTOMY Left 04/03/2019   BREAST LUMPECTOMY WITH RADIOACTIVE SEED AND SENTINEL LYMPH NODE BIOPSY Left 04/03/2019   Procedure: LEFT BREAST LUMPECTOMY WITH RADIOACTIVE SEED AND SENTINEL LYMPH NODE MAPPING;  Surgeon: Erroll Luna, MD;  Location: [facility];  Service: General;  Laterality: Left;   CESAREAN SECTION     EYE SURGERY Right    cataract   EYE SURGERY Left    cataract   TONSILLECTOMY     Family History  Problem Relation Age of Onset   Heart attack Father    Congestive Heart Failure Father    Osteoporosis Maternal Aunt    Uterine cancer Maternal Aunt    Cancer Mother        possible lung - spread    Early death Mother    Stroke Maternal Grandmother    Breast cancer Paternal Grandmother    Stroke Paternal Grandfather    Social History   Socioeconomic History   Marital status: Married    Spouse name: Ed   Number of children: 2   Years of education: 16   Highest education level: Bachelor's degree (e.g., BA, AB, BS)  Occupational History   Occupation: Retired    Comment: Geophysicist/field seismologist  Tobacco Use   Smoking status: Never   Smokeless tobacco: Never  Vaping Use   Vaping Use: Never used  Substance and Sexual Activity   Alcohol use: Yes    Alcohol/week: 7.0 standard drinks of alcohol    Types: 7 Standard drinks or equivalent per week    Comment: wine nightly    Drug use: No   Sexual activity: Yes    Birth control/protection: Post-menopausal  Other Topics Concern   Not on file  Social History Narrative   Lives with Ed   2 cats    2 children = neither local    Social Determinants of Health   Financial Resource Strain: Low Risk  (12/04/2021)   Overall Financial Resource Strain (CARDIA)    Difficulty of Paying Living Expenses: Not hard at all  Food Insecurity: No Food Insecurity (12/04/2021)   Hunger Vital Sign    Worried About Running Out of Food in the Last Year: Never true    Ran Out of Food in the Last Year: Never true  Transportation Needs: No Transportation Needs (12/04/2021)   PRAPARE - Transportation  Lack of Transportation (Medical): No    Lack of Transportation (Non-Medical): No  Physical Activity: Insufficiently Active (12/04/2021)   Exercise Vital Sign    Days of Exercise per Week: 7 days    Minutes of Exercise per Session: 20 min  Stress: No Stress Concern Present (12/04/2021)   [place]    Feeling of Stress : Only a little  Social Connections: Socially Integrated (12/04/2021)   Social Connection and Isolation Panel [NHANES]    Frequency of Communication with Friends and Family: More than three times a week    Frequency of Social Gatherings with Friends and Family: More than three times a week    Attends Religious Services: More than 4 times per year    Active Member of [employer] or Organizations: Yes    Attends Music therapist: More than 4 times per year    Marital Status: Married    Tobacco Counseling Counseling given: Not Answered   Clinical Intake:  Pre-visit preparation completed: Yes  Pain : No/denies pain     BMI - recorded: 18.64 Nutritional Status: BMI <19  Underweight Nutritional Risks: None Diabetes: No  How often do you need to have someone help you when you read instructions, pamphlets, or other written materials from your  doctor or pharmacy?: 1 - Never  Diabetic? no  Interpreter Needed?: No  Information entered by :: Vernona Peake, LPN   Activities of Daily Living    12/04/2021    9:54 AM  In your present state of health, do you have any difficulty performing the following activities:  Hearing? 1  Comment mild - mostly with background noise  Vision? 0  Difficulty concentrating or making decisions? 0  Walking or climbing stairs? 0  Dressing or bathing? 0  Doing errands, shopping? 0  Preparing Food and eating ? N  Using the Toilet? N  In the past six months, have you accidently leaked urine? Y  Do you have problems with loss of bowel control? N  Managing your Medications? N  Managing your Finances? N  Housekeeping or managing your Housekeeping? N    Patient Care Team: Janora Norlander, DO as PCP - General (Family Medicine) Clarene Essex, MD as Consulting Physician (Gastroenterology) Lavonna Monarch, MD as Consulting Physician (Dermatology) Mauro Kaufmann, RN as Oncology Nurse Navigator Rockwell Germany, RN as Oncology Nurse Navigator Magrinat, Virgie Dad, MD (Inactive) as Consulting Physician (Oncology) Rutherford Guys, MD as Consulting Physician (Ophthalmology) Kyung Rudd, MD as Consulting Physician (Radiation Oncology) Erroll Luna, MD as Consulting Physician (General Surgery)  Indicate any recent Medical Services you may have received from other than [facility] providers in the past year (date may be approximate).     Assessment:   This is a routine wellness examination for [place].  Hearing/Vision screen Hearing Screening - Comments:: C/o mild hearing difficulties - sometimes has difficulty hearing when there is a lot of background noise  Vision Screening - Comments:: Wears rx glasses - up to date with routine eye exams with [facility]  Dietary issues and exercise activities discussed: Current Exercise Habits: Home exercise routine, Type of exercise: walking, Time (Minutes): 20, Frequency  (Times/Week): 7, Weekly Exercise (Minutes/Week): 140, Intensity: Mild, Exercise limited by: None identified   Goals Addressed             This Visit's Progress    DIET - INCREASE WATER INTAKE   On track    Try to drink 6-8 glasses of  water daily     Exercise 3x per week (30 min per time)   Not on track      Depression Screen    12/04/2021    9:53 AM 09/18/2021   11:04 AM 09/10/2021    3:58 PM 03/24/2021    3:49 PM 12/03/2020    9:20 AM 09/15/2020    8:30 AM 05/21/2020   11:11 AM  PHQ 2/9 Scores  PHQ - 2 Score 0 0 0 1 0 0 0  PHQ- 9 Score  '1 1 3   '$ 0    Fall Risk    12/04/2021    9:49 AM 09/18/2021   11:04 AM 09/10/2021    3:58 PM 03/24/2021    3:49 PM 12/03/2020    9:25 AM  Fall Risk   Falls in the past year? 0 0 0 0 0  Number falls in past yr: 0    0  Injury with Fall? 0    0  Risk for fall due to : No Fall Risks    Impaired vision;Medication side effect  Follow up Falls prevention discussed    Falls prevention discussed    FALL RISK PREVENTION PERTAINING TO THE HOME:  Any stairs in or around the home? Yes  If so, are there any without handrails? No  Home free of loose throw rugs in walkways, pet beds, electrical cords, etc? Yes  Adequate lighting in your home to reduce risk of falls? Yes   ASSISTIVE DEVICES UTILIZED TO PREVENT FALLS:  Life alert? No  Use of a cane, walker or w/c? No  Grab bars in the bathroom? No  Shower chair or bench in shower? Yes  Elevated toilet seat or a handicapped toilet? Yes   TIMED UP AND GO:  Was the test performed? No . Telephonic visit  Cognitive Function:    11/15/2017   11:31 AM 10/07/2016   11:03 AM  MMSE - Mini Mental State Exam  Orientation to time 5 5  Orientation to Place 5 5  Registration 3 3  Attention/ Calculation 5 5  Recall 3 2  Language- name 2 objects 2 2  Language- repeat 1 1  Language- follow 3 step command 3 3  Language- read & follow direction 1 1  Write a sentence 1 1  Copy design 1 1  Total score 30 29         12/04/2021    9:56 AM 12/03/2019   10:42 AM 12/01/2018    9:13 AM  6CIT Screen  What Year? 0 points 0 points 0 points  What month? 0 points 0 points 0 points  What time? 0 points 0 points 0 points  Count back from 20 0 points 0 points 0 points  Months in reverse 2 points 0 points 0 points  Repeat phrase 4 points 0 points 0 points  Total Score 6 points 0 points 0 points    Immunizations Immunization History  Administered Date(s) Administered   Fluad Quad(high Dose 65+) 03/01/2019, 03/04/2020, 02/23/2021   Influenza Whole 02/14/2010   Influenza, High Dose Seasonal PF 03/18/2016, 02/17/2017, 03/07/2018   Influenza,inj,Quad PF,6+ Mos 04/03/2013, 03/14/2014, 03/28/2015   Moderna Sars-Covid-2 Vaccination 11/25/2020   PFIZER(Purple Top)SARS-COV-2 Vaccination 06/05/2019, 06/28/2019, 03/19/2020   PNEUMOCOCCAL CONJUGATE-20 09/18/2021   Pneumococcal Conjugate-13 05/15/2013   Pneumococcal Polysaccharide-23 08/15/2009   Td 06/09/2018   Tdap 05/18/2007   Zoster Recombinat (Shingrix) 10/07/2016, 03/24/2017   Zoster, Live 06/09/2006    TDAP status: Up to date  Flu Vaccine  status: Up to date  Pneumococcal vaccine status: Up to date  Covid-19 vaccine status: Completed vaccines  Qualifies for Shingles Vaccine? Yes   Zostavax completed Yes   Shingrix Completed?: Yes  Screening Tests Health Maintenance  Topic Date Due   COVID-19 Vaccine (5 - Booster for Pfizer series) 01/20/2021   INFLUENZA VACCINE  12/15/2021   MAMMOGRAM  05/19/2022   DEXA SCAN  06/18/2022   COLONOSCOPY (Pts 45-46yr Insurance coverage will need to be confirmed)  11/28/2024   TETANUS/TDAP  06/09/2028   Pneumonia Vaccine 76 Years old  Completed   Hepatitis C Screening  Completed   Zoster Vaccines- Shingrix  Completed   HPV VACCINES  Aged Out    Health Maintenance  Health Maintenance Due  Topic Date Due   COVID-19 Vaccine (5 - Booster for PSapulpaseries) 01/20/2021    Colorectal cancer screening:  Type of screening: Colonoscopy. Completed 11/29/2019. Repeat every 5 years  Mammogram status: Completed 05/19/2021. Repeat every year  Bone Density status: Completed 06/18/2020. Results reflect: Bone density results: OSTEOPENIA. Repeat every 2 years.  Lung Cancer Screening: (Low Dose CT Chest recommended if Age 77-80years, 30 pack-year currently smoking OR have quit w/in 15years.) does not qualify.   Additional Screening:  Hepatitis C Screening: does qualify; Completed 03/01/2019  Vision Screening: Recommended annual ophthalmology exams for early detection of glaucoma and other disorders of the eye. Is the patient up to date with their annual eye exam?  Yes  Who is the provider or what is the name of the office in which the patient attends annual eye exams? [facility]If pt is not established with a provider, would they like to be referred to a provider to establish care? No .   Dental Screening: Recommended annual dental exams for proper oral hygiene  Community Resource Referral / Chronic Care Management: CRR required this visit?  No   CCM required this visit?  No      Plan:     I have personally reviewed and noted the following in the patient's chart:   Medical and social history Use of alcohol, tobacco or illicit drugs  Current medications and supplements including opioid prescriptions.  Functional ability and status Nutritional status Physical activity Advanced directives List of other physicians Hospitalizations, surgeries, and ER visits in previous 12 months Vitals Screenings to include cognitive, depression, and falls Referrals and appointments  In addition, I have reviewed and discussed with patient certain preventive protocols, quality metrics, and best practice recommendations. A written personalized care plan for preventive services as well as general preventive health recommendations were provided to patient.     ASandrea Hammond LPN   [id number]  Nurse Notes:  None

## 2021-12-04 NOTE — Patient Instructions (Signed)
[name] , Thank you for taking time to come for your Medicare Wellness Visit. I appreciate your ongoing commitment to your health goals. Please review the following plan we discussed and let me know if I can assist you in the future.   Screening recommendations/referrals: Colonoscopy: Done 11/29/2019 - Repeat in 5 years  Mammogram: Done 05/19/2021 - Repeat annually  Bone Density: Done 06/18/2020 - Repeat every 2 years  Recommended yearly ophthalmology/optometry visit for glaucoma screening and checkup Recommended yearly dental visit for hygiene and checkup  Vaccinations: Influenza vaccine: Done 02/23/2021 - Repeat annually Pneumococcal vaccine: Done 08/15/2009, 05/15/2013 & 09/18/2021 Tdap vaccine: Done 06/09/2018 - Repeat in 10 years  Shingles vaccine: Done 10/07/2016 & 03/24/2017  Covid-19:Done 06/05/2019, 06/28/2019, 03/19/2020, & 11/25/2020  Advanced directives: Please bring a copy of your health care power of attorney and living will to the office to be added to your chart at your convenience.   Conditions/risks identified: Aim for 30 minutes of exercise or brisk walking, 6-8 glasses of water, and 5 servings of fruits and vegetables each day.   Next appointment: Follow up in one year for your annual wellness visit    Preventive Care 65 Years and Older, Female Preventive care refers to lifestyle choices and visits with your health care provider that can promote health and wellness. What does preventive care include? A yearly physical exam. This is also called an annual well check. Dental exams once or twice a year. Routine eye exams. Ask your health care provider how often you should have your eyes checked. Personal lifestyle choices, including: Daily care of your teeth and gums. Regular physical activity. Eating a healthy diet. Avoiding tobacco and drug use. Limiting alcohol use. Practicing safe sex. Taking low-dose aspirin every day. Taking vitamin and mineral supplements as recommended  by your health care provider. What happens during an annual well check? The services and screenings done by your health care provider during your annual well check will depend on your age, overall health, lifestyle risk factors, and family history of disease. Counseling  Your health care provider may ask you questions about your: Alcohol use. Tobacco use. Drug use. Emotional well-being. Home and relationship well-being. Sexual activity. Eating habits. History of falls. Memory and ability to understand (cognition). Work and work Statistician. Reproductive health. Screening  You may have the following tests or measurements: Height, weight, and BMI. Blood pressure. Lipid and cholesterol levels. These may be checked every 5 years, or more frequently if you are over 68 years old. Skin check. Lung cancer screening. You may have this screening every year starting at age 85 if you have a 30-pack-year history of smoking and currently smoke or have quit within the past 15 years. Fecal occult blood test (FOBT) of the stool. You may have this test every year starting at age 68. Flexible sigmoidoscopy or colonoscopy. You may have a sigmoidoscopy every 5 years or a colonoscopy every 10 years starting at age 58. Hepatitis C blood test. Hepatitis B blood test. Sexually transmitted disease (STD) testing. Diabetes screening. This is done by checking your blood sugar (glucose) after you have not eaten for a while (fasting). You may have this done every 1-3 years. Bone density scan. This is done to screen for osteoporosis. You may have this done starting at age 84. Mammogram. This may be done every 1-2 years. Talk to your health care provider about how often you should have regular mammograms. Talk with your health care provider about your test results, treatment  options, and if necessary, the need for more tests. Vaccines  Your health care provider may recommend certain vaccines, such as: Influenza  vaccine. This is recommended every year. Tetanus, diphtheria, and acellular pertussis (Tdap, Td) vaccine. You may need a Td booster every 10 years. Zoster vaccine. You may need this after age 33. Pneumococcal 13-valent conjugate (PCV13) vaccine. One dose is recommended after age 37. Pneumococcal polysaccharide (PPSV23) vaccine. One dose is recommended after age 64. Talk to your health care provider about which screenings and vaccines you need and how often you need them. This information is not intended to replace advice given to you by your health care provider. Make sure you discuss any questions you have with your health care provider. Document Released: 05/30/2015 Document Revised: 01/21/2016 Document Reviewed: 03/04/2015 Elsevier Interactive Patient Education  2017 [place] Prevention in the Home Falls can cause injuries. They can happen to people of all ages. There are many things you can do to make your home safe and to help prevent falls. What can I do on the outside of my home? Regularly fix the edges of walkways and driveways and fix any cracks. Remove anything that might make you trip as you walk through a door, such as a raised step or threshold. Trim any bushes or trees on the path to your home. Use bright outdoor lighting. Clear any walking paths of anything that might make someone trip, such as rocks or tools. Regularly check to see if handrails are loose or broken. Make sure that both sides of any steps have handrails. Any raised decks and porches should have guardrails on the edges. Have any leaves, snow, or ice cleared regularly. Use sand or salt on walking paths during winter. Clean up any spills in your garage right away. This includes oil or grease spills. What can I do in the bathroom? Use night lights. Install grab bars by the toilet and in the tub and shower. Do not use towel bars as grab bars. Use non-skid mats or decals in the tub or shower. If you  need to sit down in the shower, use a plastic, non-slip stool. Keep the floor dry. Clean up any water that spills on the floor as soon as it happens. Remove soap buildup in the tub or shower regularly. Attach bath mats securely with double-sided non-slip rug tape. Do not have throw rugs and other things on the floor that can make you trip. What can I do in the bedroom? Use night lights. Make sure that you have a light by your bed that is easy to reach. Do not use any sheets or blankets that are too big for your bed. They should not hang down onto the floor. Have a firm chair that has side arms. You can use this for support while you get dressed. Do not have throw rugs and other things on the floor that can make you trip. What can I do in the kitchen? Clean up any spills right away. Avoid walking on wet floors. Keep items that you use a lot in easy-to-reach places. If you need to reach something above you, use a strong step stool that has a grab bar. Keep electrical cords out of the way. Do not use floor polish or wax that makes floors slippery. If you must use wax, use non-skid floor wax. Do not have throw rugs and other things on the floor that can make you trip. What can I do with my stairs? Do not  leave any items on the stairs. Make sure that there are handrails on both sides of the stairs and use them. Fix handrails that are broken or loose. Make sure that handrails are as long as the stairways. Check any carpeting to make sure that it is firmly attached to the stairs. Fix any carpet that is loose or worn. Avoid having throw rugs at the top or bottom of the stairs. If you do have throw rugs, attach them to the floor with carpet tape. Make sure that you have a light switch at the top of the stairs and the bottom of the stairs. If you do not have them, ask someone to add them for you. What else can I do to help prevent falls? Wear shoes that: Do not have high heels. Have rubber  bottoms. Are comfortable and fit you well. Are closed at the toe. Do not wear sandals. If you use a stepladder: Make sure that it is fully opened. Do not climb a closed stepladder. Make sure that both sides of the stepladder are locked into place. Ask someone to hold it for you, if possible. Clearly mark and make sure that you can see: Any grab bars or handrails. First and last steps. Where the edge of each step is. Use tools that help you move around (mobility aids) if they are needed. These include: Canes. Walkers. Scooters. Crutches. Turn on the lights when you go into a dark area. Replace any light bulbs as soon as they burn out. Set up your furniture so you have a clear path. Avoid moving your furniture around. If any of your floors are uneven, fix them. If there are any pets around you, be aware of where they are. Review your medicines with your doctor. Some medicines can make you feel dizzy. This can increase your chance of falling. Ask your doctor what other things that you can do to help prevent falls. This information is not intended to replace advice given to you by your health care provider. Make sure you discuss any questions you have with your health care provider. Document Released: 02/27/2009 Document Revised: 10/09/2015 Document Reviewed: 06/07/2014 Elsevier Interactive Patient Education  2017 [employer].

## 2021-12-12 ENCOUNTER — Encounter: Payer: Self-pay | Admitting: Dermatology

## 2021-12-12 NOTE — Progress Notes (Signed)
   Follow-Up Visit   Subjective  Kayla Randolph is a 76 y.o. female who presents for the following: Skin Problem (Patient here today for lesion on her right inner lower leg x few weeks. Per patient the lesion has been itching and slow to heal. Per patient there is redness around the lesion, sore at time and some drainage. Personal history of non mole skin cancer. ).  Nonhealing lesion right lower leg for 2+ weeks Location:  Duration:  Quality:  Associated Signs/Symptoms: Modifying Factors:  Severity:  Timing: Context:   Objective  Well appearing patient in no apparent distress; mood and affect are within normal limits. Right Inner Lower Leg Painful 5 mm by roughly 2 mm deep ulceration with 8 mm rim of erythema and some edema.  Intact pedal pulses.  Perhaps this represents minor trauma with secondary infection.  It is not typical of the more common venous ulcers.  If culture is negative and she does not respond to antibiotics, will refer for vascular studies.       A focused examination was performed including lower legs, including vascular exam.. Relevant physical exam findings are noted in the Assessment and Plan.   Assessment & Plan    Ulcer of right lower extremity, unspecified ulcer stage (HCC) Right Inner Lower Leg  Bacterial culture, oral doxycycline 100 mg this result.  doxycycline (VIBRA-TABS) 100 MG tablet - Right Inner Lower Leg Take 1 tablet (100 mg total) by mouth in the morning and at bedtime.  Anaerobic and Aerobic Culture - Right Inner Lower Leg  Local infection of skin and subcutaneous tissue  Related Procedures Anaerobic and Aerobic Culture      I, Lavonna Monarch, MD, have reviewed all documentation for this visit.  The documentation on 12/12/21 for the exam, diagnosis, procedures, and orders are all accurate and complete.

## 2022-02-04 ENCOUNTER — Telehealth: Payer: Self-pay | Admitting: Family Medicine

## 2022-02-04 NOTE — Telephone Encounter (Signed)
Please review prolia

## 2022-02-05 NOTE — Telephone Encounter (Signed)
**  Correction **  We were able to get APPROVED on the NEW [employer] portal --LouAnn was so helpful!   We worked through a series of questions.  The patient does carry a DX/history of osteoporosis, however she technically now meets "osteopenia" because the prolia has improved her scores/bone health.    Thank you! Raynelle Highland

## 2022-02-05 NOTE — Telephone Encounter (Signed)
The [employer] portal would not let me register Unable to find hardcopy forms on their website I called [phone] to speak with rep, but was on hold for 30+ min Tried calling Joycelyn Schmid --no answer  Did enter in "cover my meds"----but not helpful! I did try! Placed papers back on your desk

## 2022-02-11 NOTE — Telephone Encounter (Signed)
Prolia ordered

## 2022-02-17 NOTE — Telephone Encounter (Signed)
Message left for patient that we have prolia and she can get at her appointment on Friday.

## 2022-02-19 ENCOUNTER — Ambulatory Visit (INDEPENDENT_AMBULATORY_CARE_PROVIDER_SITE_OTHER): Payer: Medicare Other | Admitting: Family Medicine

## 2022-02-19 ENCOUNTER — Encounter: Payer: Self-pay | Admitting: Family Medicine

## 2022-02-19 VITALS — BP 117/64 | HR 65 | Temp 98.0°F | Ht 65.0 in | Wt 114.4 lb

## 2022-02-19 DIAGNOSIS — L299 Pruritus, unspecified: Secondary | ICD-10-CM

## 2022-02-19 DIAGNOSIS — M79671 Pain in right foot: Secondary | ICD-10-CM

## 2022-02-19 DIAGNOSIS — M47812 Spondylosis without myelopathy or radiculopathy, cervical region: Secondary | ICD-10-CM

## 2022-02-19 DIAGNOSIS — E034 Atrophy of thyroid (acquired): Secondary | ICD-10-CM

## 2022-02-19 DIAGNOSIS — M8580 Other specified disorders of bone density and structure, unspecified site: Secondary | ICD-10-CM | POA: Diagnosis not present

## 2022-02-19 MED ORDER — ESCITALOPRAM OXALATE 10 MG PO TABS: 10.0000 mg | ORAL_TABLET | Freq: Every day | ORAL | 3 refills | Status: DC

## 2022-02-19 MED ORDER — DENOSUMAB 60 MG/ML ~~LOC~~ SOSY
60.0000 mg | PREFILLED_SYRINGE | Freq: Once | SUBCUTANEOUS | Status: AC
  Administered 2022-02-19: 60 mg via SUBCUTANEOUS

## 2022-02-19 NOTE — Progress Notes (Signed)
Subjective: CC: Chronic follow-up PCP: Kayla Norlander, DO NTZ:GYFVC Kayla Randolph is a 76 y.o. female presenting to clinic today for:  1.  Osteopenia with high fracture risk/joint pain Patient is tolerating Prolia without difficulty.  She does sometimes get arthritic pain but no overt bony pain.  Primarily she gets difficulty in her neck and on the lateral right foot.  She uses Tylenol sometimes for this and it does help but she does not take anything scheduled.  She has been using various over-the-counter arthritic gels as well with varying degrees of relief.  She has home physical therapy notes but does not do these regularly.  Sometimes her feet swell at the end of the day but she is not quite sure what to make of that.  2.  Itching Continues to have pruritus of the skin.  Uncertain etiology.  This waxes and wanes.  She does not take any oral antihistamines and has no known exposures  3.  Hypothyroidism Patient is compliant with Synthroid.  No reports of tremor, heart palpitations, change in bowel habits or weight   ROS: Per HPI  Allergies  Allergen Reactions   Macrodantin    Sulfa Antibiotics    Wellbutrin [Bupropion Hcl]    Past Medical History:  Diagnosis Date   Allergy    Anxiety    Atypical mole 11/23/2002   moderate-right lower back([facility])   Basal cell carcinoma 02/06/2019   nod-left nare ([facility])   Breast cancer ([place])    Cataract    Depression    History of colon polyps    Hyperlipidemia    Hypothyroidism    Interstitial cystitis    History of   Osteopenia    Personal history of radiation therapy    S/P lumpectomy, left breast 04/03/2019   Skin cancer    removed from left nostril   Vitamin D deficiency     Current Outpatient Medications:    Black Pepper-Turmeric (TURMERIC COMPLEX/BLACK PEPPER PO), Take by mouth., Disp: , Rfl:    Cholecalciferol (VITAMIN D3) 1000 UNITS CAPS, Take 2 capsules by mouth daily., Disp: , Rfl:    cycloSPORINE (RESTASIS) 0.05 %  ophthalmic emulsion, Place 1 drop into both eyes 2 (two) times daily., Disp: , Rfl:    denosumab (PROLIA) 60 MG/ML SOSY injection, Inject 60 mg into the skin every 6 (six) months., Disp: , Rfl:    escitalopram (LEXAPRO) 10 MG tablet, Take 1 tablet (10 mg total) by mouth daily., Disp: 90 tablet, Rfl: 3   estradiol (ESTRACE) 0.1 MG/GM vaginal cream, INSERT 1 VAGINALLY AT BEDTIME, Disp: 127.5 g, Rfl: 2   fluticasone (FLONASE) 50 MCG/ACT nasal spray, 1 SPRAY IN EACH NOSTRIL EVERY 12 HOURS, Disp: 48 g, Rfl: 1   levothyroxine (SYNTHROID) 25 MCG tablet, Take 1 tablet (25 mcg total) by mouth daily before breakfast., Disp: 90 tablet, Rfl: 3   Multiple Vitamins-Minerals (WOMENS 50+ MULTI VITAMIN/MIN PO), Take by mouth., Disp: , Rfl:    Omega-3 Fatty Acids (SUPER OMEGA-3 PO), Take by mouth., Disp: , Rfl:    Polyethylene Glycol 3350 (MIRALAX PO), Take 1 packet by mouth daily as needed., Disp: , Rfl:    rosuvastatin (CRESTOR) 5 MG tablet, TAKE (1) TABLET DAILY AS DIRECTED., Disp: 90 tablet, Rfl: 1   Specialty Vitamins Products (ONE-A-DAY BONE STRENGTH) 500-28-100 MG-MG-UNIT TABS, Take 1 tablet by mouth 3 (three) times daily., Disp: , Rfl:    tamoxifen (NOLVADEX) 20 MG tablet, Take 1 tablet (20 mg total) by mouth daily., Disp:  90 tablet, Rfl: 4 Social History   Socioeconomic History   Marital status: Married    Spouse name: Ed   Number of children: 2   Years of education: 16   Highest education level: Bachelor's degree (e.g., BA, AB, BS)  Occupational History   Occupation: Retired    Comment: Geophysicist/field seismologist  Tobacco Use   Smoking status: Never   Smokeless tobacco: Never  Vaping Use   Vaping Use: Never used  Substance and Sexual Activity   Alcohol use: Yes    Alcohol/week: 7.0 standard drinks of alcohol    Types: 7 Standard drinks or equivalent per week    Comment: wine nightly   Drug use: No   Sexual activity: Yes    Birth control/protection: Post-menopausal  Other Topics Concern   Not  on file  Social History Narrative   Lives with Ed   2 cats    2 children = neither local    Social Determinants of Health   Financial Resource Strain: Low Risk  (12/04/2021)   Overall Financial Resource Strain (CARDIA)    Difficulty of Paying Living Expenses: Not hard at all  Food Insecurity: No Food Insecurity (12/04/2021)   Hunger Vital Sign    Worried About Running Out of Food in the Last Year: Never true    Ran Out of Food in the Last Year: Never true  Transportation Needs: No Transportation Needs (12/04/2021)   PRAPARE - Hydrologist (Medical): No    Lack of Transportation (Non-Medical): No  Physical Activity: Insufficiently Active (12/04/2021)   Exercise Vital Sign    Days of Exercise per Week: 7 days    Minutes of Exercise per Session: 20 min  Stress: No Stress Concern Present (12/04/2021)   [place]    Feeling of Stress : Only a little  Social Connections: Socially Integrated (12/04/2021)   Social Connection and Isolation Panel [NHANES]    Frequency of Communication with Friends and Family: More than three times a week    Frequency of Social Gatherings with Friends and Family: More than three times a week    Attends Religious Services: More than 4 times per year    Active Member of [employer] or Organizations: Yes    Attends Music therapist: More than 4 times per year    Marital Status: Married  Human resources officer Violence: Not At Risk (12/04/2021)   Humiliation, Afraid, Rape, and Kick questionnaire    Fear of Current or Ex-Partner: No    Emotionally Abused: No    Physically Abused: No    Sexually Abused: No   Family History  Problem Relation Age of Onset   Heart attack Father    Congestive Heart Failure Father    Osteoporosis Maternal Aunt    Uterine cancer Maternal Aunt    Cancer Mother        possible lung - spread    Early death Mother    Stroke Maternal  Grandmother    Breast cancer Paternal Grandmother    Stroke Paternal Grandfather     Objective: Office vital signs reviewed. BP 117/64   Pulse 65   Temp 98 F (36.7 C)   Ht _0  (1.651 m)   Wt 114 lb 6.4 oz (51.9 kg)   SpO2 99%   BMI 19.04 kg/m   Physical Examination:  General: Awake, alert, well-appearing female no acute distress, No acute distress HEENT: No exophthalmos.  No goiter.  TMs intact bilaterally.  Scant cerumen on the right external auditory canal Cardio: regular rate and rhythm, S1S2 heard, no murmurs appreciated Pulm: clear to auscultation bilaterally, no wheezes, rhonchi or rales; normal work of breathing on room air GI: soft, non-tender, non-distended, bowel sounds present x4, no hepatomegaly, no splenomegaly, no masses  Assessment/ Plan: 76 y.o. female   Osteopenia with high risk of fracture - Plan: CMP14+EGFR, denosumab (PROLIA) injection 60 mg  Hypothyroidism due to acquired atrophy of thyroid - Plan: TSH, T4, Free  Cervical spine arthritis  Right foot pain  Pruritus  Prolia administered.  Check calcium level given mild reduction on last lab draw  Check thyroid levels.  Asymptomatic  Discussed use of Tylenol arthritis up to 3 times daily if needed for joint pain.  Topical NSAID may be of benefit as well.  Offered referral for foot today but she declined  For the pruritus, I do recommend starting oral antihistamine.  We discussed Allegra versus Claritin as these are nonsedating  No orders of the defined types were placed in this encounter.  No orders of the defined types were placed in this encounter.    Kayla Norlander, DO [place] [phone]

## 2022-02-19 NOTE — Patient Instructions (Signed)
Tylenol arthritis up to 3 times daily ok for arthritic pain.  Claritin or Allegra daily to prevent itching.

## 2022-02-20 LAB — CMP14+EGFR
ALT: 11 IU/L (ref 0–32)
AST: 17 IU/L (ref 0–40)
Albumin/Globulin Ratio: 2.1 (ref 1.2–2.2)
Albumin: 4.1 g/dL (ref 3.8–4.8)
Alkaline Phosphatase: 36 IU/L — ABNORMAL LOW (ref 44–121)
BUN/Creatinine Ratio: 24 (ref 12–28)
BUN: 17 mg/dL (ref 8–27)
Bilirubin Total: 0.3 mg/dL (ref 0.0–1.2)
CO2: 25 mmol/L (ref 20–29)
Calcium: 9.3 mg/dL (ref 8.7–10.3)
Chloride: 104 mmol/L (ref 96–106)
Creatinine, Ser: 0.7 mg/dL (ref 0.57–1.00)
Globulin, Total: 2 g/dL (ref 1.5–4.5)
Glucose: 85 mg/dL (ref 70–99)
Potassium: 4.2 mmol/L (ref 3.5–5.2)
Sodium: 142 mmol/L (ref 134–144)
Total Protein: 6.1 g/dL (ref 6.0–8.5)
eGFR: 89 mL/min/{1.73_m2} (ref >59)

## 2022-02-20 LAB — T4, FREE: Free T4: 1.1 ng/dL (ref 0.82–1.77)

## 2022-02-20 LAB — TSH: TSH: 3.11 u[IU]/mL (ref 0.450–4.500)

## 2022-03-10 ENCOUNTER — Ambulatory Visit: Payer: Medicare Other | Admitting: Dermatology

## 2022-03-10 DIAGNOSIS — Z08 Encounter for follow-up examination after completed treatment for malignant neoplasm: Secondary | ICD-10-CM | POA: Diagnosis not present

## 2022-03-10 DIAGNOSIS — Z85828 Personal history of other malignant neoplasm of skin: Secondary | ICD-10-CM | POA: Diagnosis not present

## 2022-03-10 DIAGNOSIS — D225 Melanocytic nevi of trunk: Secondary | ICD-10-CM | POA: Diagnosis not present

## 2022-03-10 DIAGNOSIS — Z1283 Encounter for screening for malignant neoplasm of skin: Secondary | ICD-10-CM | POA: Diagnosis not present

## 2022-03-10 DIAGNOSIS — L298 Other pruritus: Secondary | ICD-10-CM | POA: Diagnosis not present

## 2022-03-15 ENCOUNTER — Ambulatory Visit (INDEPENDENT_AMBULATORY_CARE_PROVIDER_SITE_OTHER): Payer: Medicare Other

## 2022-03-15 DIAGNOSIS — Z23 Encounter for immunization: Secondary | ICD-10-CM | POA: Diagnosis not present

## 2022-03-29 ENCOUNTER — Encounter (INDEPENDENT_AMBULATORY_CARE_PROVIDER_SITE_OTHER): Payer: Medicare Other | Admitting: Family Medicine

## 2022-03-29 DIAGNOSIS — U071 COVID-19: Secondary | ICD-10-CM

## 2022-03-29 MED ORDER — BENZONATATE 100 MG PO CAPS: 100.0000 mg | ORAL_CAPSULE | Freq: Three times a day (TID) | ORAL | 0 refills | Status: DC | PRN

## 2022-03-29 MED ORDER — MOLNUPIRAVIR EUA 200MG CAPSULE: 4.0000 | ORAL_CAPSULE | Freq: Two times a day (BID) | ORAL | 0 refills | Status: AC

## 2022-03-29 NOTE — Telephone Encounter (Signed)
Please see the MyChart message reply(ies) for my assessment and plan.   Meds ordered this encounter  Medications   molnupiravir EUA (LAGEVRIO) 200 mg CAPS capsule    Sig: Take 4 capsules (800 mg total) by mouth 2 (two) times daily for 5 days.    Dispense:  40 capsule    Refill:  0    Dx 03/29/2022.   benzonatate (TESSALON PERLES) 100 MG capsule    Sig: Take 1 capsule (100 mg total) by mouth 3 (three) times daily as needed.    Dispense:  20 capsule    Refill:  0      This patient gave consent for this Medical Advice Message and is aware that it may result in a bill to [employer], as well as the possibility of receiving a bill for a co-payment or deductible. They are an established patient, but are not seeking medical advice exclusively about a problem treated during an in person or video visit in the last seven days. I did not recommend an in person or video visit within seven days of my reply.    I spent a total of 5 minutes cumulative time within 7 days through [employer].  Ronnie Doss, DO

## 2022-04-01 ENCOUNTER — Other Ambulatory Visit: Payer: Self-pay | Admitting: Family Medicine

## 2022-04-01 NOTE — Telephone Encounter (Signed)
Last office visit 10/6./23 Last refill 03/24/21, 127.5 grams, 2 refills

## 2022-05-20 ENCOUNTER — Ambulatory Visit
Admission: RE | Admit: 2022-05-20 | Discharge: 2022-05-20 | Disposition: A | Discharge status: Home / Self Care | Payer: Medicare Other | Source: Ambulatory Visit | Attending: Hematology and Oncology | Admitting: Hematology and Oncology

## 2022-05-20 DIAGNOSIS — R922 Inconclusive mammogram: Secondary | ICD-10-CM | POA: Diagnosis not present

## 2022-05-20 DIAGNOSIS — Z170 Estrogen receptor positive status [ER+]: Secondary | ICD-10-CM

## 2022-05-20 DIAGNOSIS — Z853 Personal history of malignant neoplasm of breast: Secondary | ICD-10-CM | POA: Diagnosis not present

## 2022-05-31 ENCOUNTER — Other Ambulatory Visit: Payer: Self-pay | Admitting: Family Medicine

## 2022-06-28 ENCOUNTER — Other Ambulatory Visit: Payer: Self-pay | Admitting: Family Medicine

## 2022-07-12 ENCOUNTER — Telehealth: Payer: Self-pay | Admitting: Hematology and Oncology

## 2022-07-12 NOTE — Telephone Encounter (Signed)
Rescheduled patient , patient is aware of date and time off appointment.

## 2022-08-02 ENCOUNTER — Ambulatory Visit (INDEPENDENT_AMBULATORY_CARE_PROVIDER_SITE_OTHER): Payer: Medicare Other

## 2022-08-02 ENCOUNTER — Ambulatory Visit (INDEPENDENT_AMBULATORY_CARE_PROVIDER_SITE_OTHER): Payer: Medicare Other | Admitting: Family Medicine

## 2022-08-02 ENCOUNTER — Encounter: Payer: Self-pay | Admitting: Family Medicine

## 2022-08-02 VITALS — BP 105/64 | HR 74 | Temp 98.7°F | Ht 65.0 in | Wt 114.0 lb

## 2022-08-02 DIAGNOSIS — J3089 Other allergic rhinitis: Secondary | ICD-10-CM | POA: Diagnosis not present

## 2022-08-02 DIAGNOSIS — M8588 Other specified disorders of bone density and structure, other site: Secondary | ICD-10-CM | POA: Diagnosis not present

## 2022-08-02 DIAGNOSIS — E034 Atrophy of thyroid (acquired): Secondary | ICD-10-CM

## 2022-08-02 DIAGNOSIS — M8580 Other specified disorders of bone density and structure, unspecified site: Secondary | ICD-10-CM

## 2022-08-02 DIAGNOSIS — M79674 Pain in right toe(s): Secondary | ICD-10-CM

## 2022-08-02 DIAGNOSIS — E559 Vitamin D deficiency, unspecified: Secondary | ICD-10-CM | POA: Diagnosis not present

## 2022-08-02 NOTE — Progress Notes (Signed)
Subjective: CC: Pinky toe PCP: Janora Norlander, DO YF:[id number] H Pompa is a 77 y.o. female presenting to clinic today for:  1.  Toe pain Patient reports that her right pinky toe had a corn medially.  She used a corn pad but now the whole right toe seems to be inflamed.  It does rub against her shoes but she has not used anything on the lateral aspect to prevent this rubbing.  2.  Allergic rhinitis/cough Patient reports that she feels like she needs to clear her cough.  She points to the base of her throat as the area of irritation.  No shortness of breath, no wheezing, no fevers, no hemoptysis.  She calls that her "church cough" because it seems to be most prevalent when she is in charge and she becomes embarrassed by this.  She uses lozenges frequently.  She does admit to postnasal drainage does not take her oral antihistamine though she does think that she has 1 at home to use.  Has Flonase as well.  3.  Hypothyroidism, osteopenia Compliant with medications.  She is on Prolia for the osteopenia with high risk of fracture.  Would like to have DEXA scan done today.  She reports some irritable bowel that occurred last couple of weeks but that seems to be getting slightly better.  Otherwise no changes in bowel habits.   ROS: Per HPI  Allergies  Allergen Reactions   Macrodantin    Sulfa Antibiotics    Wellbutrin [Bupropion Hcl]    Past Medical History:  Diagnosis Date   Allergy    Anxiety    Atypical mole 11/23/2002   moderate-right lower back(WS)   Basal cell carcinoma 02/06/2019   nod-left nare (MOHS)   Breast cancer ([place])    Cataract    Depression    History of colon polyps    Hyperlipidemia    Hypothyroidism    Interstitial cystitis    History of   Osteopenia    Personal history of radiation therapy    S/P lumpectomy, left breast 04/03/2019   Skin cancer    removed from left nostril   Vitamin D deficiency     Current Outpatient Medications:    benzonatate  (TESSALON PERLES) 100 MG capsule, Take 1 capsule (100 mg total) by mouth 3 (three) times daily as needed., Disp: 20 capsule, Rfl: 0   Black Pepper-Turmeric (TURMERIC COMPLEX/BLACK PEPPER PO), Take by mouth., Disp: , Rfl:    Cholecalciferol (VITAMIN D3) 1000 UNITS CAPS, Take 2 capsules by mouth daily., Disp: , Rfl:    cycloSPORINE (RESTASIS) 0.05 % ophthalmic emulsion, Place 1 drop into both eyes 2 (two) times daily., Disp: , Rfl:    denosumab (PROLIA) 60 MG/ML SOSY injection, Inject 60 mg into the skin every 6 (six) months., Disp: , Rfl:    escitalopram (LEXAPRO) 10 MG tablet, Take 1 tablet (10 mg total) by mouth daily., Disp: 90 tablet, Rfl: 3   estradiol (ESTRACE) 0.1 MG/GM vaginal cream, INSERT VAGINALLY AS DIRECTED AT BEDTIME, Disp: 127.5 g, Rfl: 2   fluticasone (FLONASE) 50 MCG/ACT nasal spray, 1 SPRAY IN EACH NOSTRIL EVERY 12 HOURS, Disp: 48 g, Rfl: 1   levothyroxine (SYNTHROID) 25 MCG tablet, Take 1 tablet (25 mcg total) by mouth daily before breakfast., Disp: 90 tablet, Rfl: 3   Multiple Vitamins-Minerals (WOMENS 50+ MULTI VITAMIN/MIN PO), Take by mouth., Disp: , Rfl:    Omega-3 Fatty Acids (SUPER OMEGA-3 PO), Take by mouth., Disp: , Rfl:  Polyethylene Glycol 3350 (MIRALAX PO), Take 1 packet by mouth daily as needed., Disp: , Rfl:    rosuvastatin (CRESTOR) 5 MG tablet, TAKE (1) TABLET DAILY AS DIRECTED., Disp: 30 tablet, Rfl: 1   Specialty Vitamins Products (ONE-A-DAY BONE STRENGTH) 500-28-100 MG-MG-UNIT TABS, Take 1 tablet by mouth 3 (three) times daily., Disp: , Rfl:    tamoxifen (NOLVADEX) 20 MG tablet, Take 1 tablet (20 mg total) by mouth daily., Disp: 90 tablet, Rfl: 4 Social History   Socioeconomic History   Marital status: Married    Spouse name: Ed   Number of children: 2   Years of education: 16   Highest education level: Bachelor's degree (e.g., BA, AB, BS)  Occupational History   Occupation: Retired    Comment: Geophysicist/field seismologist  Tobacco Use   Smoking status: Never    Smokeless tobacco: Never  Vaping Use   Vaping Use: Never used  Substance and Sexual Activity   Alcohol use: Yes    Alcohol/week: 7.0 standard drinks of alcohol    Types: 7 Standard drinks or equivalent per week    Comment: wine nightly   Drug use: No   Sexual activity: Yes    Birth control/protection: Post-menopausal  Other Topics Concern   Not on file  Social History Narrative   Lives with Ed   2 cats    2 children = neither local    Social Determinants of Health   Financial Resource Strain: Low Risk  (12/04/2021)   Overall Financial Resource Strain (CARDIA)    Difficulty of Paying Living Expenses: Not hard at all  Food Insecurity: No Food Insecurity (12/04/2021)   Hunger Vital Sign    Worried About Running Out of Food in the Last Year: Never true    Ran Out of Food in the Last Year: Never true  Transportation Needs: No Transportation Needs (12/04/2021)   PRAPARE - Hydrologist (Medical): No    Lack of Transportation (Non-Medical): No  Physical Activity: Insufficiently Active (12/04/2021)   Exercise Vital Sign    Days of Exercise per Week: 7 days    Minutes of Exercise per Session: 20 min  Stress: No Stress Concern Present (12/04/2021)   [place]    Feeling of Stress : Only a little  Social Connections: Socially Integrated (12/04/2021)   Social Connection and Isolation Panel [NHANES]    Frequency of Communication with Friends and Family: More than three times a week    Frequency of Social Gatherings with Friends and Family: More than three times a week    Attends Religious Services: More than 4 times per year    Active Member of [employer] or Organizations: Yes    Attends Music therapist: More than 4 times per year    Marital Status: Married  Human resources officer Violence: Not At Risk (12/04/2021)   Humiliation, Afraid, Rape, and Kick questionnaire    Fear of Current or  Ex-Partner: No    Emotionally Abused: No    Physically Abused: No    Sexually Abused: No   Family History  Problem Relation Age of Onset   Heart attack Father    Congestive Heart Failure Father    Osteoporosis Maternal Aunt    Uterine cancer Maternal Aunt    Cancer Mother        possible lung - spread    Early death Mother    Stroke Maternal Grandmother  Breast cancer Paternal Grandmother    Stroke Paternal Grandfather     Objective: Office vital signs reviewed. BP 105/64   Pulse 74   Temp 98.7 F (37.1 C)   Ht 5\' 5"  (1.651 m)   Wt 114 lb (51.7 kg)   SpO2 95%   BMI 18.97 kg/m   Physical Examination:  General: Awake, alert, thin nontoxic female, No acute distress HEENT: Sclera white.  Moist mucous membranes. Cardio: regular rate and rhythm, S1S2 heard, no murmurs appreciated Pulm: clear to auscultation bilaterally, no wheezes, rhonchi or rales; normal work of breathing on room air MSK: Right foot: Has some soft tissue swelling and erythema of the right pinky toe on the dorsal lateral aspect of the toe.  There is no warmth.  No skin breakdown.  Ambulating independently with normal gait and station   Assessment/ Plan: 77 y.o. female   Osteopenia with high risk of fracture - Plan: DG [facility] DEXA, CMP14+EGFR, CBC, VITAMIN D 25 Hydroxy (Vit-D Deficiency, Fractures)  Hypothyroidism due to acquired atrophy of thyroid - Plan: TSH, T4, Free  Pain of toe of right foot - Plan: Ambulatory referral to Podiatry  Non-seasonal allergic rhinitis, unspecified trigger  Check DEXA scan given history of osteopenia with high risk of fracture.  Check renal function, calcium level, vitamin D  Thyroid levels also collected today.  Continue current regimen for now  Referral placed to podiatry for further evaluation of that right pinky toe.  We discussed barrier method of using moleskin or something similar to reduce rubbing.  Wonder if she would benefit from orthotics  I question if she  has some type of trigger in her church that causes the need to clear her throat.  She certainly sounds like she has some postnasal drainage contributing.  Her pulmonary exam was totally unremarkable if symptoms are ongoing despite conservative care we need to consider chest x-ray.  For now, she will resume use of her oral antihistamine and nasal spray.  We will reconvene if symptoms are unrelenting  Orders Placed This Encounter  Procedures   DG [facility] DEXA    Standing Status:   Future    Standing Expiration Date:   08/02/2023    Order Specific Question:   Reason for Exam (SYMPTOM  OR DIAGNOSIS REQUIRED)    Answer:   eval osteoporosis   TSH   T4, Free   CMP14+EGFR   CBC   VITAMIN D 25 Hydroxy (Vit-D Deficiency, Fractures)   No orders of the defined types were placed in this encounter.    Janora Norlander, DO [place] [phone]

## 2022-08-03 DIAGNOSIS — M85832 Other specified disorders of bone density and structure, left forearm: Secondary | ICD-10-CM | POA: Diagnosis not present

## 2022-08-03 DIAGNOSIS — M85852 Other specified disorders of bone density and structure, left thigh: Secondary | ICD-10-CM | POA: Diagnosis not present

## 2022-08-03 DIAGNOSIS — M85851 Other specified disorders of bone density and structure, right thigh: Secondary | ICD-10-CM | POA: Diagnosis not present

## 2022-08-03 LAB — CMP14+EGFR
ALT: 16 IU/L (ref 0–32)
AST: 21 IU/L (ref 0–40)
Albumin/Globulin Ratio: 2.1 (ref 1.2–2.2)
Albumin: 4.1 g/dL (ref 3.8–4.8)
Alkaline Phosphatase: 35 IU/L — ABNORMAL LOW (ref 44–121)
BUN/Creatinine Ratio: 24 (ref 12–28)
BUN: 16 mg/dL (ref 8–27)
Bilirubin Total: 0.3 mg/dL (ref 0.0–1.2)
CO2: 24 mmol/L (ref 20–29)
Calcium: 9.2 mg/dL (ref 8.7–10.3)
Chloride: 102 mmol/L (ref 96–106)
Creatinine, Ser: 0.68 mg/dL (ref 0.57–1.00)
Globulin, Total: 2 g/dL (ref 1.5–4.5)
Glucose: 81 mg/dL (ref 70–99)
Potassium: 4.1 mmol/L (ref 3.5–5.2)
Sodium: 142 mmol/L (ref 134–144)
Total Protein: 6.1 g/dL (ref 6.0–8.5)
eGFR: 89 mL/min/{1.73_m2} (ref >59)

## 2022-08-03 LAB — CBC
Hematocrit: 39.9 % (ref 34.0–46.6)
Hemoglobin: 13.1 g/dL (ref 11.1–15.9)
MCH: 31 pg (ref 26.6–33.0)
MCHC: 32.8 g/dL (ref 31.5–35.7)
MCV: 95 fL (ref 79–97)
Platelets: 203 10*3/uL (ref 150–450)
RBC: 4.22 x10E6/uL (ref 3.77–5.28)
RDW: 12.6 % (ref 11.7–15.4)
WBC: 7.4 10*3/uL (ref 3.4–10.8)

## 2022-08-03 LAB — VITAMIN D 25 HYDROXY (VIT D DEFICIENCY, FRACTURES): Vit D, 25-Hydroxy: 53.1 ng/mL (ref 30.0–100.0)

## 2022-08-03 LAB — T4, FREE: Free T4: 1.15 ng/dL (ref 0.82–1.77)

## 2022-08-03 LAB — TSH: TSH: 1.85 u[IU]/mL (ref 0.450–4.500)

## 2022-08-12 DIAGNOSIS — H524 Presbyopia: Secondary | ICD-10-CM | POA: Diagnosis not present

## 2022-08-12 DIAGNOSIS — H52203 Unspecified astigmatism, bilateral: Secondary | ICD-10-CM | POA: Diagnosis not present

## 2022-08-12 DIAGNOSIS — H5213 Myopia, bilateral: Secondary | ICD-10-CM | POA: Diagnosis not present

## 2022-08-12 DIAGNOSIS — Z961 Presence of intraocular lens: Secondary | ICD-10-CM | POA: Diagnosis not present

## 2022-08-12 DIAGNOSIS — H04123 Dry eye syndrome of bilateral lacrimal glands: Secondary | ICD-10-CM | POA: Diagnosis not present

## 2022-08-30 ENCOUNTER — Other Ambulatory Visit (HOSPITAL_COMMUNITY): Payer: Self-pay

## 2022-08-30 ENCOUNTER — Telehealth: Payer: Self-pay

## 2022-08-30 NOTE — Telephone Encounter (Signed)
Pt ready for scheduling for Prolia on or after : 08/30/22  Out-of-pocket cost due at time of visit: $322  Primary: UHC medicare Prolia co-insurance: 20% Admin fee co-insurance: $20  Secondary: --- Prolia co-insurance:  Admin fee co-insurance:   Medical Benefit Details: Date Benefits were checked: 08/30/22 Deductible: NO/ Coinsurance: 20%/ Admin Fee: $20  Prior Auth: APPROVED PA# [id number] Expiration Date: 08/30/23   Pharmacy benefit: Copay $300 If patient wants fill through the pharmacy benefit please send prescription to: [facility], and include estimated need by date in rx notes. Pharmacy will ship medication directly to the office.  Patient NOT eligible for Prolia Copay Card. Copay Card can make patient's cost as little as $25. Link to apply: [email or web address]://[email or web address]  ** This summary of benefits is an estimation of the patient's out-of-pocket cost. Exact cost may very based on individual plan coverage.

## 2022-08-30 NOTE — Telephone Encounter (Signed)
Prolia is here with patients name on it and she is ready to schedule. She must be scheduled after 08/30/22.

## 2022-08-30 NOTE — Telephone Encounter (Signed)
Prolia VOB initiated via [email or web address]  Last Prolia inj: 02/19/22

## 2022-09-02 DIAGNOSIS — M2042 Other hammer toe(s) (acquired), left foot: Secondary | ICD-10-CM | POA: Diagnosis not present

## 2022-09-02 DIAGNOSIS — M2041 Other hammer toe(s) (acquired), right foot: Secondary | ICD-10-CM | POA: Diagnosis not present

## 2022-09-02 DIAGNOSIS — M79674 Pain in right toe(s): Secondary | ICD-10-CM | POA: Diagnosis not present

## 2022-09-02 DIAGNOSIS — M25872 Other specified joint disorders, left ankle and foot: Secondary | ICD-10-CM | POA: Diagnosis not present

## 2022-09-02 DIAGNOSIS — M79675 Pain in left toe(s): Secondary | ICD-10-CM | POA: Diagnosis not present

## 2022-09-02 DIAGNOSIS — L84 Corns and callosities: Secondary | ICD-10-CM | POA: Diagnosis not present

## 2022-09-02 DIAGNOSIS — M25871 Other specified joint disorders, right ankle and foot: Secondary | ICD-10-CM | POA: Diagnosis not present

## 2022-09-14 NOTE — Telephone Encounter (Signed)
Appointment 05/03 @ 9:30am

## 2022-09-17 ENCOUNTER — Ambulatory Visit (INDEPENDENT_AMBULATORY_CARE_PROVIDER_SITE_OTHER): Payer: Medicare Other

## 2022-09-17 DIAGNOSIS — M8580 Other specified disorders of bone density and structure, unspecified site: Secondary | ICD-10-CM | POA: Diagnosis not present

## 2022-09-17 MED ORDER — DENOSUMAB 60 MG/ML ~~LOC~~ SOSY
60.0000 mg | PREFILLED_SYRINGE | Freq: Once | SUBCUTANEOUS | Status: AC
  Administered 2022-09-17: 60 mg via SUBCUTANEOUS

## 2022-09-17 NOTE — Progress Notes (Signed)
Prolia injection given to right upper arm.  Patient tolerated well. Buy and bill

## 2022-09-30 ENCOUNTER — Ambulatory Visit: Payer: PRIVATE HEALTH INSURANCE | Admitting: Hematology and Oncology

## 2022-09-30 ENCOUNTER — Other Ambulatory Visit: Payer: PRIVATE HEALTH INSURANCE

## 2022-10-01 ENCOUNTER — Other Ambulatory Visit: Payer: Self-pay | Admitting: Family Medicine

## 2022-10-01 ENCOUNTER — Other Ambulatory Visit: Payer: Self-pay | Admitting: Hematology and Oncology

## 2022-10-02 ENCOUNTER — Other Ambulatory Visit: Payer: Self-pay | Admitting: Family Medicine

## 2022-10-05 ENCOUNTER — Other Ambulatory Visit: Payer: Self-pay | Admitting: Family Medicine

## 2022-10-07 ENCOUNTER — Inpatient Hospital Stay (HOSPITAL_BASED_OUTPATIENT_CLINIC_OR_DEPARTMENT_OTHER): Payer: Medicare Other | Admitting: Hematology and Oncology

## 2022-10-07 ENCOUNTER — Inpatient Hospital Stay: Payer: Medicare Other | Attending: Hematology and Oncology

## 2022-10-07 ENCOUNTER — Other Ambulatory Visit: Payer: Self-pay | Admitting: *Deleted

## 2022-10-07 VITALS — BP 150/70 | HR 64 | Temp 97.9°F | Resp 16 | Wt 113.2 lb

## 2022-10-07 DIAGNOSIS — C50212 Malignant neoplasm of upper-inner quadrant of left female breast: Secondary | ICD-10-CM

## 2022-10-07 DIAGNOSIS — M8580 Other specified disorders of bone density and structure, unspecified site: Secondary | ICD-10-CM | POA: Diagnosis not present

## 2022-10-07 DIAGNOSIS — Z853 Personal history of malignant neoplasm of breast: Secondary | ICD-10-CM

## 2022-10-07 DIAGNOSIS — Z170 Estrogen receptor positive status [ER+]: Secondary | ICD-10-CM | POA: Diagnosis not present

## 2022-10-07 DIAGNOSIS — Z79810 Long term (current) use of selective estrogen receptor modulators (SERMs): Secondary | ICD-10-CM | POA: Diagnosis not present

## 2022-10-07 LAB — CBC WITH DIFFERENTIAL (CANCER CENTER ONLY)
Abs Immature Granulocytes: 0.01 10*3/uL (ref 0.00–0.07)
Basophils Absolute: 0 10*3/uL (ref 0.0–0.1)
Basophils Relative: 0 %
Eosinophils Absolute: 0.1 10*3/uL (ref 0.0–0.5)
Eosinophils Relative: 2 %
HCT: 45.7 % (ref 36.0–46.0)
Hemoglobin: 14.8 g/dL (ref 12.0–15.0)
Immature Granulocytes: 0 %
Lymphocytes Relative: 17 %
Lymphs Abs: 1 10*3/uL (ref 0.7–4.0)
MCH: 30.6 pg (ref 26.0–34.0)
MCHC: 32.4 g/dL (ref 30.0–36.0)
MCV: 94.6 fL (ref 80.0–100.0)
Monocytes Absolute: 0.6 10*3/uL (ref 0.1–1.0)
Monocytes Relative: 10 %
Neutro Abs: 4.4 10*3/uL (ref 1.7–7.7)
Neutrophils Relative %: 71 %
Platelet Count: 166 10*3/uL (ref 150–400)
RBC: 4.83 MIL/uL (ref 3.87–5.11)
RDW: 15.3 % (ref 11.5–15.5)
WBC Count: 6.2 10*3/uL (ref 4.0–10.5)
nRBC: 0 % (ref 0.0–0.2)

## 2022-10-07 LAB — CMP (CANCER CENTER ONLY)
ALT: 12 U/L (ref 0–44)
AST: 20 U/L (ref 15–41)
Albumin: 4.1 g/dL (ref 3.5–5.0)
Alkaline Phosphatase: 30 U/L — ABNORMAL LOW (ref 38–126)
Anion gap: 3 — ABNORMAL LOW (ref 5–15)
BUN: 17 mg/dL (ref 8–23)
CO2: 34 mmol/L — ABNORMAL HIGH (ref 22–32)
Calcium: 9.2 mg/dL (ref 8.9–10.3)
Chloride: 106 mmol/L (ref 98–111)
Creatinine: 0.65 mg/dL (ref 0.44–1.00)
GFR, Estimated: >60 mL/min (ref >60)
Glucose, Bld: 72 mg/dL (ref 70–99)
Potassium: 4.3 mmol/L (ref 3.5–5.1)
Sodium: 143 mmol/L (ref 135–145)
Total Bilirubin: 0.4 mg/dL (ref 0.3–1.2)
Total Protein: 6.9 g/dL (ref 6.5–8.1)

## 2022-10-07 NOTE — Progress Notes (Signed)
[facility]  [phone] [phone]     ID: Kayla Randolph DOB: 08-13-1945  MR#: [id number]  [name]  Patient Care Team: [name], DO as PCP - General (Family Medicine) Vida Rigger, MD as Consulting Physician (Gastroenterology) Janalyn Harder, MD (Inactive) as Consulting Physician (Dermatology) Pershing Proud, RN as Oncology Nurse Navigator Donnelly Angelica, RN as Oncology Nurse Navigator Magrinat, Valentino Hue, MD (Inactive) as Consulting Physician (Oncology) Jethro Bolus, MD as Consulting Physician (Ophthalmology) Dorothy Puffer, MD as Consulting Physician (Radiation Oncology) Harriette Bouillon, MD as Consulting Physician (General Surgery) Rachel Moulds, MD OTHER MD:  CHIEF COMPLAINT: estrogen receptor positive breast cancer  CURRENT TREATMENT: Tamoxifen   INTERVAL HISTORY: Kayla Randolph returns today for follow up of her estrogen receptor positive breast cancer.  She started tamoxifen on 07/02/2019.  She is tolerating it well. No adverse effects.  Last mammogram in January 2024, no evidence of malignancy.  Breast density category C. She continues to have random episodes of itching, she was prescribed some Kenalog and prednisone for management of pruritus.  She is trying to get a refill for kenalog. She has not otherwise noticed any changes in her breast.  Rest of the pertinent 10 point ROS reviewed and negative    COVID 19 VACCINATION STATUS: Pfizer x3, and Moderna booster 11/2020   HISTORY OF CURRENT ILLNESS:    From the original intake note:   "Kayla Randolph" had routine screening mammography on 03/02/2019 showing a possible abnormality in the left breast. She underwent left diagnostic mammography with tomography and left breast ultrasonography at [facility] on 03/09/2019 showing: breast density category C; suspicious 1.1 cm left breast mass at 3:30 with associated punctate calcifications; no left axillary adenopathy.  Accordingly on  03/13/2019 she proceeded to biopsy of the left breast area in question. The pathology from this procedure ([id number]) showed: invasive ductal carcinoma, grade 2. Prognostic indicators significant for: estrogen receptor, 100% positive and progesterone receptor, 100% positive, both with strong staining intensity. Proliferation marker Ki67 at 30%. HER2 negative by immunohistochemistry (1+).  The patient's subsequent history is as detailed below.   PAST MEDICAL HISTORY: Past Medical History:  Diagnosis Date   Allergy    Anxiety    Atypical mole 11/23/2002   moderate-right lower back(WS)   Basal cell carcinoma 02/06/2019   nod-left nare (MOHS)   Breast cancer ([facility])    Cataract    Depression    History of colon polyps    Hyperlipidemia    Hypothyroidism    Interstitial cystitis    History of   Osteopenia    Personal history of radiation therapy    S/P lumpectomy, left breast 04/03/2019   Skin cancer    removed from left nostril   Vitamin D deficiency     PAST SURGICAL HISTORY: Past Surgical History:  Procedure Laterality Date   BREAST BIOPSY Left 03/13/2019   BREAST LUMPECTOMY Left 04/03/2019   BREAST LUMPECTOMY WITH RADIOACTIVE SEED AND SENTINEL LYMPH NODE BIOPSY Left 04/03/2019   Procedure: LEFT BREAST LUMPECTOMY WITH RADIOACTIVE SEED AND SENTINEL LYMPH NODE MAPPING;  Surgeon: Harriette Bouillon, MD;  Location:  SURGERY CENTER;  Service: General;  Laterality: Left;   CESAREAN SECTION     EYE SURGERY Right    cataract   EYE SURGERY Left    cataract   TONSILLECTOMY      FAMILY HISTORY: Family History  Problem Relation Age of Onset   Heart attack Father    Congestive Heart Failure  Father    Osteoporosis Maternal Aunt    Uterine cancer Maternal Aunt    Cancer Mother        possible lung - spread    Early death Mother    Stroke Maternal Grandmother    Breast cancer Paternal Grandmother    Stroke Paternal Grandfather   Patient's father was 56 years old when he  died from congestive heart failure. Patient's mother died from cancer, type unknown to the patient, at age 26. The patient denies a family hx of ovarian cancer. She reports a maternal aunt was diagnosed with uterine cancer (age unsure), and her paternal grandmother with breast cancer at a very young age (exact age unsure).  The patient has no siblings.   GYNECOLOGIC HISTORY:  No LMP recorded. Patient is postmenopausal. Menarche: [age] years old Age at first live birth: 77 years old GX P 2 LMP age [age] Contraceptive: yes, used pills on and off from age [age] without issue. HRT: yes, for about 5 years, until age 8 or 76.  Hysterectomy? no BSO? no   SOCIAL HISTORY: (updated 03/2019)  Kayla Randolph is currently retired from working as a Runner, broadcasting/film/video (fourth grade and substitute).  Husband Kayla Randolph is retired Solicitor in the [employer].. She lives at home with Kayla Randolph and 2 cats. Daughter Kayla Randolph, age 35, lives in [place] as a homemaker. Son Kayla Randolph, age 53, lives in [place], [place] as a IT trainer.  The patient has 4 grandchildren one of them attending [employer].  She attends [employer] in [place]    ADVANCED DIRECTIVES: In the absence of any documentation to the contrary, the patient's spouse is their HCPOA.   HEALTH MAINTENANCE: Social History   Tobacco Use   Smoking status: Never   Smokeless tobacco: Never  Vaping Use   Vaping Use: Never used  Substance Use Topics   Alcohol use: Yes    Alcohol/week: 7.0 standard drinks of alcohol    Types: 7 Standard drinks or equivalent per week    Comment: wine nightly   Drug use: No     Colonoscopy: 09/2011  PAP: 2019  Bone density: 06/2018, -2.0   Allergies  Allergen Reactions   Macrodantin    Sulfa Antibiotics    Wellbutrin [Bupropion Hcl]     Current Outpatient Medications  Medication Sig Dispense Refill   benzonatate (TESSALON PERLES) 100 MG capsule Take 1 capsule (100 mg total) by mouth 3 (three) times daily as needed. 20 capsule 0    Black Pepper-Turmeric (TURMERIC COMPLEX/BLACK PEPPER PO) Take by mouth.     Cholecalciferol (VITAMIN D3) 1000 UNITS CAPS Take 2 capsules by mouth daily.     cycloSPORINE (RESTASIS) 0.05 % ophthalmic emulsion Place 1 drop into both eyes 2 (two) times daily.     denosumab (PROLIA) 60 MG/ML SOSY injection Inject 60 mg into the skin every 6 (six) months.     escitalopram (LEXAPRO) 10 MG tablet Take 1 tablet (10 mg total) by mouth daily. 90 tablet 3   estradiol (ESTRACE) 0.1 MG/GM vaginal cream INSERT VAGINALLY AS DIRECTED AT BEDTIME 127.5 g 2   fluticasone (FLONASE) 50 MCG/ACT nasal spray 1 SPRAY IN EACH NOSTRIL EVERY 12 HOURS 48 g 1   levothyroxine (SYNTHROID) 25 MCG tablet TAKE 1 TABLET DAILY BEFORE BREAKFAST 90 tablet 2   Multiple Vitamins-Minerals (WOMENS 50+ MULTI VITAMIN/MIN PO) Take by mouth.     Omega-3 Fatty Acids (SUPER OMEGA-3 PO) Take by mouth.     Polyethylene Glycol 3350 (MIRALAX PO) Take  1 packet by mouth daily as needed.     rosuvastatin (CRESTOR) 5 MG tablet TAKE (1) TABLET DAILY AS DIRECTED. 90 tablet 1   Specialty Vitamins Products (ONE-A-DAY BONE STRENGTH) 500-28-100 MG-MG-UNIT TABS Take 1 tablet by mouth 3 (three) times daily.     tamoxifen (NOLVADEX) 20 MG tablet TAKE ONE TABLET ONCE DAILY 90 tablet 4   No current facility-administered medications for this visit.    OBJECTIVE: white woman who appears younger than stated age  Vitals:   10/07/22 1141  BP: (!) 150/70  Pulse: 64  Resp: 16  Temp: 97.9 F (36.6 C)  SpO2: 100%       Body mass index is 18.84 kg/m.   Wt Readings from Last 3 Encounters:  10/07/22 113 lb 3.2 oz (51.3 kg)  08/02/22 114 lb (51.7 kg)  02/19/22 114 lb 6.4 oz (51.9 kg)     ECOG FS:1 - Symptomatic but completely ambulatory  Physical Exam Constitutional:      Appearance: Normal appearance.  Chest:     Comments: Bilateral breast examined.  No palpable masses.  Postradiation changes noted in the left breast.  Postop seroma felt in the left  axilla Musculoskeletal:     Cervical back: Normal range of motion and neck supple. No rigidity.  Lymphadenopathy:     Cervical: No cervical adenopathy.  Neurological:     Mental Status: She is alert.       LAB RESULTS:  CMP     Component Value Date/Time   NA 142 08/02/2022 1350   K 4.1 08/02/2022 1350   CL 102 08/02/2022 1350   CO2 24 08/02/2022 1350   GLUCOSE 81 08/02/2022 1350   GLUCOSE 90 09/29/2021 1123   BUN 16 08/02/2022 1350   CREATININE 0.68 08/02/2022 1350   CREATININE 0.74 09/29/2021 1123   CREATININE 0.69 07/24/2013 0836   CALCIUM 9.2 08/02/2022 1350   PROT 6.1 08/02/2022 1350   ALBUMIN 4.1 08/02/2022 1350   AST 21 08/02/2022 1350   AST 18 09/29/2021 1123   ALT 16 08/02/2022 1350   ALT 14 09/29/2021 1123   ALKPHOS 35 (L) 08/02/2022 1350   BILITOT 0.3 08/02/2022 1350   BILITOT 0.4 09/29/2021 1123   GFRNONAA >60 09/29/2021 1123   GFRNONAA 89 07/24/2013 0836   GFRAA 92 05/23/2020 0930   GFRAA >60 06/21/2019 1204   GFRAA >89 07/24/2013 0836    No results found for: "TOTALPROTELP", "ALBUMINELP", "A1GS", "A2GS", "BETS", "BETA2SER", "GAMS", "MSPIKE", "SPEI"  No results found for: "KPAFRELGTCHN", "LAMBDASER", "KAPLAMBRATIO"  Lab Results  Component Value Date   WBC 6.2 10/07/2022   NEUTROABS 4.4 10/07/2022   HGB 14.8 10/07/2022   HCT 45.7 10/07/2022   MCV 94.6 10/07/2022   PLT 166 10/07/2022   No results found for: "LABCA2"  No components found for: "ZOXWRU045"  No results for input(s): "INR" in the last 168 hours.  No results found for: "LABCA2"  No results found for: "WUJ811"  No results found for: "CAN125"  No results found for: "CAN153"  No results found for: "CA2729"  No components found for: "HGQUANT"  No results found for: "CEA1", "CEA" / No results found for: "CEA1", "CEA"   No results found for: "AFPTUMOR"  No results found for: "CHROMOGRNA"  No results found for: "HGBA", "HGBA2QUANT", "HGBFQUANT",  "HGBSQUAN" (Hemoglobinopathy evaluation)   No results found for: "LDH"  No results found for: "IRON", "TIBC", "IRONPCTSAT" (Iron and TIBC)  Lab Results  Component Value Date   FERRITIN 23 09/19/2012  Urinalysis    Component Value Date/Time   APPEARANCEUR Clear 08/27/2019 0947   GLUCOSEU Negative 08/27/2019 0947   BILIRUBINUR Negative 08/27/2019 0947   PROTEINUR Negative 08/27/2019 0947   UROBILINOGEN negative 12/02/2014 1128   NITRITE Negative 08/27/2019 0947   LEUKOCYTESUR Negative 08/27/2019 0947    STUDIES: No results found.    ELIGIBLE FOR AVAILABLE RESEARCH PROTOCOL: Speaker phone study  ASSESSMENT: 77 y.o. Kayla Randolph, Kayla Randolph woman status post left breast upper inner quadrant biopsy 03/13/2019 for a clinical T1c N0, stage IA invasive ductal carcinoma, grade 2, estrogen and progesterone receptor positive, HER-2 not amplified, with an MIB-1 of 30%  (1) status post left lumpectomy 04/03/2019 for a pT1c pN0, stage IB invasive ductal carcinoma, grade 2, with negative margins  (a) a total of 2 left axillary lymph nodes were clear  (2) Oncotype score of 21 predicts a risk of recurrence outside the breast within the next 9 years of 7% if the patient's only systemic therapy is antiestrogens for 5 years.  It also predicts no benefit from chemotherapy.  (3) adjuvant radiation 05/14/2019 through 06/11/2019  (4) started tamoxifen 07/02/2019  (a) bone density 06/22/2019 shows a T score of -2.0  (b) on Estrace vaginal suppositories   PLAN:  Kayla Randolph is here for follow-up on tamoxifen.  She will complete 5 yrs of tamoxifen in Feb 2026. She denies any changes in her breast.  Her last mammogram was in January without any evidence of malignancy, breast density category C Physical examination today, noted left axillary seroma.   No other palpable masses. Most recent bone density in March 2024, osteopenia.  Encouraged weight bearing exercises. She continues to use vaginal estrogen  when she remembers for vaginal dryness.  Return to clinic in 1 year  Total encounter time 30 minutes.Rachel Moulds, MD   10/07/2022 11:42 AM Medical Oncology and Hematology [facility] [address] [place], Kayla Randolph [address] Tel. [phone]    Fax. [phone]   *Total Encounter Time as defined by the Centers for Medicare and Medicaid Services includes, in addition to the face-to-face time of a patient visit (documented in the note above) non-face-to-face time: obtaining and reviewing outside history, ordering and reviewing medications, tests or procedures, care coordination (communications with other health care professionals or caregivers) and documentation in the medical record.

## 2022-12-07 ENCOUNTER — Ambulatory Visit (INDEPENDENT_AMBULATORY_CARE_PROVIDER_SITE_OTHER): Payer: Medicare Other

## 2022-12-07 VITALS — Ht 65.0 in | Wt 112.0 lb

## 2022-12-07 DIAGNOSIS — Z0000 Encounter for general adult medical examination without abnormal findings: Secondary | ICD-10-CM

## 2022-12-07 NOTE — Patient Instructions (Signed)
[name] , Thank you for taking time to come for your Medicare Wellness Visit. I appreciate your ongoing commitment to your health goals. Please review the following plan we discussed and let me know if I can assist you in the future.   These are the goals we discussed:  Goals      DIET - INCREASE WATER INTAKE     Try to drink 6-8 glasses of water daily     Exercise 3x per week (30 min per time)        This is a list of the screening recommended for you and due dates:  Health Maintenance  Topic Date Due   COVID-19 Vaccine (6 - 2023-24 season) 09/15/2022   Flu Shot  12/16/2022   Mammogram  05/21/2023   Medicare Annual Wellness Visit  12/07/2023   DEXA scan (bone density measurement)  08/02/2024   Colon Cancer Screening  11/28/2024   DTaP/Tdap/Td vaccine (3 - Td or Tdap) 06/09/2028   Pneumonia Vaccine  Completed   Hepatitis C Screening  Completed   Zoster (Shingles) Vaccine  Completed   HPV Vaccine  Aged Out    Advanced directives: Please bring a copy of your health care power of attorney and living will to the office to be added to your chart at your convenience.   Conditions/risks identified: Aim for 30 minutes of exercise or brisk walking, 6-8 glasses of water, and 5 servings of fruits and vegetables each day.   Next appointment: Follow up in one year for your annual wellness visit    Preventive Care 65 Years and Older, Female Preventive care refers to lifestyle choices and visits with your health care provider that can promote health and wellness. What does preventive care include? A yearly physical exam. This is also called an annual well check. Dental exams once or twice a year. Routine eye exams. Ask your health care provider how often you should have your eyes checked. Personal lifestyle choices, including: Daily care of your teeth and gums. Regular physical activity. Eating a healthy diet. Avoiding tobacco and drug use. Limiting alcohol use. Practicing safe  sex. Taking low-dose aspirin every day. Taking vitamin and mineral supplements as recommended by your health care provider. What happens during an annual well check? The services and screenings done by your health care provider during your annual well check will depend on your age, overall health, lifestyle risk factors, and family history of disease. Counseling  Your health care provider may ask you questions about your: Alcohol use. Tobacco use. Drug use. Emotional well-being. Home and relationship well-being. Sexual activity. Eating habits. History of falls. Memory and ability to understand (cognition). Work and work Astronomer. Reproductive health. Screening  You may have the following tests or measurements: Height, weight, and BMI. Blood pressure. Lipid and cholesterol levels. These may be checked every 5 years, or more frequently if you are over 21 years old. Skin check. Lung cancer screening. You may have this screening every year starting at age 33 if you have a 30-pack-year history of smoking and currently smoke or have quit within the past 15 years. Fecal occult blood test (FOBT) of the stool. You may have this test every year starting at age 6. Flexible sigmoidoscopy or colonoscopy. You may have a sigmoidoscopy every 5 years or a colonoscopy every 10 years starting at age 5. Hepatitis C blood test. Hepatitis B blood test. Sexually transmitted disease (STD) testing. Diabetes screening. This is done by checking your blood sugar (glucose) after  you have not eaten for a while (fasting). You may have this done every 1-3 years. Bone density scan. This is done to screen for osteoporosis. You may have this done starting at age 19. Mammogram. This may be done every 1-2 years. Talk to your health care provider about how often you should have regular mammograms. Talk with your health care provider about your test results, treatment options, and if necessary, the need for more  tests. Vaccines  Your health care provider may recommend certain vaccines, such as: Influenza vaccine. This is recommended every year. Tetanus, diphtheria, and acellular pertussis (Tdap, Td) vaccine. You may need a Td booster every 10 years. Zoster vaccine. You may need this after age 43. Pneumococcal 13-valent conjugate (PCV13) vaccine. One dose is recommended after age 2. Pneumococcal polysaccharide (PPSV23) vaccine. One dose is recommended after age 29. Talk to your health care provider about which screenings and vaccines you need and how often you need them. This information is not intended to replace advice given to you by your health care provider. Make sure you discuss any questions you have with your health care provider. Document Released: 05/30/2015 Document Revised: 01/21/2016 Document Reviewed: 03/04/2015 Elsevier Interactive Patient Education  2017 [employer].  Fall Prevention in the Home Falls can cause injuries. They can happen to people of all ages. There are many things you can do to make your home safe and to help prevent falls. What can I do on the outside of my home? Regularly fix the edges of walkways and driveways and fix any cracks. Remove anything that might make you trip as you walk through a door, such as a raised step or threshold. Trim any bushes or trees on the path to your home. Use bright outdoor lighting. Clear any walking paths of anything that might make someone trip, such as rocks or tools. Regularly check to see if handrails are loose or broken. Make sure that both sides of any steps have handrails. Any raised decks and porches should have guardrails on the edges. Have any leaves, snow, or ice cleared regularly. Use sand or salt on walking paths during winter. Clean up any spills in your garage right away. This includes oil or grease spills. What can I do in the bathroom? Use night lights. Install grab bars by the toilet and in the tub and shower.  Do not use towel bars as grab bars. Use non-skid mats or decals in the tub or shower. If you need to sit down in the shower, use a plastic, non-slip stool. Keep the floor dry. Clean up any water that spills on the floor as soon as it happens. Remove soap buildup in the tub or shower regularly. Attach bath mats securely with double-sided non-slip rug tape. Do not have throw rugs and other things on the floor that can make you trip. What can I do in the bedroom? Use night lights. Make sure that you have a light by your bed that is easy to reach. Do not use any sheets or blankets that are too big for your bed. They should not hang down onto the floor. Have a firm chair that has side arms. You can use this for support while you get dressed. Do not have throw rugs and other things on the floor that can make you trip. What can I do in the kitchen? Clean up any spills right away. Avoid walking on wet floors. Keep items that you use a lot in easy-to-reach places. If you  need to reach something above you, use a strong step stool that has a grab bar. Keep electrical cords out of the way. Do not use floor polish or wax that makes floors slippery. If you must use wax, use non-skid floor wax. Do not have throw rugs and other things on the floor that can make you trip. What can I do with my stairs? Do not leave any items on the stairs. Make sure that there are handrails on both sides of the stairs and use them. Fix handrails that are broken or loose. Make sure that handrails are as long as the stairways. Check any carpeting to make sure that it is firmly attached to the stairs. Fix any carpet that is loose or worn. Avoid having throw rugs at the top or bottom of the stairs. If you do have throw rugs, attach them to the floor with carpet tape. Make sure that you have a light switch at the top of the stairs and the bottom of the stairs. If you do not have them, ask someone to add them for you. What else  can I do to help prevent falls? Wear shoes that: Do not have high heels. Have rubber bottoms. Are comfortable and fit you well. Are closed at the toe. Do not wear sandals. If you use a stepladder: Make sure that it is fully opened. Do not climb a closed stepladder. Make sure that both sides of the stepladder are locked into place. Ask someone to hold it for you, if possible. Clearly mark and make sure that you can see: Any grab bars or handrails. First and last steps. Where the edge of each step is. Use tools that help you move around (mobility aids) if they are needed. These include: Canes. Walkers. Scooters. Crutches. Turn on the lights when you go into a dark area. Replace any light bulbs as soon as they burn out. Set up your furniture so you have a clear path. Avoid moving your furniture around. If any of your floors are uneven, fix them. If there are any pets around you, be aware of where they are. Review your medicines with your doctor. Some medicines can make you feel dizzy. This can increase your chance of falling. Ask your doctor what other things that you can do to help prevent falls. This information is not intended to replace advice given to you by your health care provider. Make sure you discuss any questions you have with your health care provider. Document Released: 02/27/2009 Document Revised: 10/09/2015 Document Reviewed: 06/07/2014 Elsevier Interactive Patient Education  2017 [employer].

## 2022-12-07 NOTE — Progress Notes (Signed)
Subjective:   Kayla Randolph is a 77 y.o. female who presents for Medicare Annual (Subsequent) preventive examination.  Visit Complete: Virtual  I connected with  Theadore Nan on 12/07/22 by a audio enabled telemedicine application and verified that I am speaking with the correct person using two identifiers.  Patient Location: Home  Provider Location: Home Office  I discussed the limitations of evaluation and management by telemedicine. The patient expressed understanding and agreed to proceed.  Patient Medicare AWV questionnaire was completed by the patient on 12/07/2022; I have confirmed that all information answered by patient is correct and no changes since this date.  Review of Systems     Cardiac Risk Factors include: advanced age (>72men, >42 women);dyslipidemia     Objective:    Today's Vitals   12/07/22 1348  Weight: 112 lb (50.8 kg)  Height: 5\' 5"  (1.651 m)   Body mass index is 18.64 kg/m.     12/07/2022    1:51 PM 12/04/2021    9:54 AM 12/03/2020    9:21 AM 12/03/2019   10:37 AM 10/03/2019    4:22 PM 04/25/2019    9:02 AM 04/03/2019    9:57 AM  Advanced Directives  Does Patient Have a Medical Advance Directive? Yes Yes Yes Yes Yes No;Yes Yes  Type of Estate agent of [place];Living will Healthcare Power of [place];Living will Healthcare Power of [place];Living will Healthcare Power of [place];Living will  Living will;Healthcare Power of [employer] Power of [place];Living will  Does patient want to make changes to medical advance directive?    No - Patient declined Yes (MAU/Ambulatory/Procedural Areas - Information given) No - Patient declined No - Patient declined  Copy of Healthcare Power of Attorney in Chart? No - copy requested No - copy requested No - copy requested No - copy requested  No - copy requested No - copy requested  Would patient like information on creating a medical advance directive?      No - Patient declined      Current Medications (verified) Outpatient Encounter Medications as of 12/07/2022  Medication Sig   benzonatate (TESSALON PERLES) 100 MG capsule Take 1 capsule (100 mg total) by mouth 3 (three) times daily as needed.   Black Pepper-Turmeric (TURMERIC COMPLEX/BLACK PEPPER PO) Take by mouth.   Cholecalciferol (VITAMIN D3) 1000 UNITS CAPS Take 2 capsules by mouth daily.   cycloSPORINE (RESTASIS) 0.05 % ophthalmic emulsion Place 1 drop into both eyes 2 (two) times daily.   denosumab (PROLIA) 60 MG/ML SOSY injection Inject 60 mg into the skin every 6 (six) months.   escitalopram (LEXAPRO) 10 MG tablet Take 1 tablet (10 mg total) by mouth daily.   estradiol (ESTRACE) 0.1 MG/GM vaginal cream INSERT VAGINALLY AS DIRECTED AT BEDTIME   fluticasone (FLONASE) 50 MCG/ACT nasal spray 1 SPRAY IN EACH NOSTRIL EVERY 12 HOURS   levothyroxine (SYNTHROID) 25 MCG tablet TAKE 1 TABLET DAILY BEFORE BREAKFAST   Multiple Vitamins-Minerals (WOMENS 50+ MULTI VITAMIN/MIN PO) Take by mouth.   Omega-3 Fatty Acids (SUPER OMEGA-3 PO) Take by mouth.   Polyethylene Glycol 3350 (MIRALAX PO) Take 1 packet by mouth daily as needed.   rosuvastatin (CRESTOR) 5 MG tablet TAKE (1) TABLET DAILY AS DIRECTED.   Specialty Vitamins Products (ONE-A-DAY BONE STRENGTH) 500-28-100 MG-MG-UNIT TABS Take 1 tablet by mouth 3 (three) times daily.   tamoxifen (NOLVADEX) 20 MG tablet TAKE ONE TABLET ONCE DAILY   No facility-administered encounter medications on file as of 12/07/2022.  Allergies (verified) Macrodantin, Sulfa antibiotics, and Wellbutrin [bupropion hcl]   History: Past Medical History:  Diagnosis Date   Allergy    Anxiety    Atypical mole 11/23/2002   moderate-right lower back(WS)   Basal cell carcinoma 02/06/2019   nod-left nare (MOHS)   Breast cancer (HCC)    Cataract    Depression    History of colon polyps    Hyperlipidemia    Hypothyroidism    Interstitial cystitis    History of   Osteopenia    Personal  history of radiation therapy    S/P lumpectomy, left breast 04/03/2019   Skin cancer    removed from left nostril   Vitamin D deficiency    Past Surgical History:  Procedure Laterality Date   BREAST BIOPSY Left 03/13/2019   BREAST LUMPECTOMY Left 04/03/2019   BREAST LUMPECTOMY WITH RADIOACTIVE SEED AND SENTINEL LYMPH NODE BIOPSY Left 04/03/2019   Procedure: LEFT BREAST LUMPECTOMY WITH RADIOACTIVE SEED AND SENTINEL LYMPH NODE MAPPING;  Surgeon: Harriette Bouillon, MD;  Location: [facility];  Service: General;  Laterality: Left;   CESAREAN SECTION     EYE SURGERY Right    cataract   EYE SURGERY Left    cataract   TONSILLECTOMY     Family History  Problem Relation Age of Onset   Heart attack Father    Congestive Heart Failure Father    Osteoporosis Maternal Aunt    Uterine cancer Maternal Aunt    Cancer Mother        possible lung - spread    Early death Mother    Stroke Maternal Grandmother    Breast cancer Paternal Grandmother    Stroke Paternal Grandfather    Social History   Socioeconomic History   Marital status: Married    Spouse name: Ed   Number of children: 2   Years of education: 16   Highest education level: Bachelor's degree (e.g., BA, AB, BS)  Occupational History   Occupation: Retired    Comment: Software engineer  Tobacco Use   Smoking status: Never   Smokeless tobacco: Never  Vaping Use   Vaping status: Never Used  Substance and Sexual Activity   Alcohol use: Yes    Alcohol/week: 7.0 standard drinks of alcohol    Types: 7 Standard drinks or equivalent per week    Comment: wine nightly   Drug use: No   Sexual activity: Yes    Birth control/protection: Post-menopausal  Other Topics Concern   Not on file  Social History Narrative   Lives with Ed   2 cats    2 children = neither local    Social Determinants of Health   Financial Resource Strain: Low Risk  (12/07/2022)   Overall Financial Resource Strain (CARDIA)    Difficulty  of Paying Living Expenses: Not hard at all  Food Insecurity: No Food Insecurity (12/07/2022)   Hunger Vital Sign    Worried About Running Out of Food in the Last Year: Never true    Ran Out of Food in the Last Year: Never true  Transportation Needs: No Transportation Needs (12/07/2022)   PRAPARE - Administrator, Civil Service (Medical): No    Lack of Transportation (Non-Medical): No  Physical Activity: Insufficiently Active (12/07/2022)   Exercise Vital Sign    Days of Exercise per Week: 3 days    Minutes of Exercise per Session: 30 min  Stress: No Stress Concern Present (12/07/2022)   [employer]  of Occupational Health - Occupational Stress Questionnaire    Feeling of Stress : Not at all  Social Connections: Socially Integrated (12/07/2022)   Social Connection and Isolation Panel [NHANES]    Frequency of Communication with Friends and Family: More than three times a week    Frequency of Social Gatherings with Friends and Family: More than three times a week    Attends Religious Services: More than 4 times per year    Active Member of [employer] or Organizations: Yes    Attends Engineer, structural: More than 4 times per year    Marital Status: Married    Tobacco Counseling Counseling given: Not Answered   Clinical Intake:  Pre-visit preparation completed: Yes  Pain : No/denies pain     Nutritional Risks: None Diabetes: No  How often do you need to have someone help you when you read instructions, pamphlets, or other written materials from your doctor or pharmacy?: 1 - Never  Interpreter Needed?: No  Information entered by :: Renie Ora, LPN   Activities of Daily Living    12/07/2022    1:51 PM  In your present state of health, do you have any difficulty performing the following activities:  Hearing? 0  Vision? 0  Difficulty concentrating or making decisions? 0  Walking or climbing stairs? 0  Dressing or bathing? 0  Doing errands, shopping?  0  Preparing Food and eating ? N  Using the Toilet? N  In the past six months, have you accidently leaked urine? N  Do you have problems with loss of bowel control? N  Managing your Medications? N  Managing your Finances? N  Housekeeping or managing your Housekeeping? N    Patient Care Team: [name], DO as PCP - General (Family Medicine) Vida Rigger, MD as Consulting Physician (Gastroenterology) Janalyn Harder, MD (Inactive) as Consulting Physician (Dermatology) Pershing Proud, RN as Oncology Nurse Navigator Donnelly Angelica, RN as Oncology Nurse Navigator Magrinat, Valentino Hue, MD (Inactive) as Consulting Physician (Oncology) Jethro Bolus, MD as Consulting Physician (Ophthalmology) Dorothy Puffer, MD as Consulting Physician (Radiation Oncology) Harriette Bouillon, MD as Consulting Physician (General Surgery)  Indicate any recent Medical Services you may have received from other than [facility] providers in the past year (date may be approximate).     Assessment:   This is a routine wellness examination for [place].  Hearing/Vision screen Vision Screening - Comments:: Wears rx glasses - up to date with routine eye exams with  Dr.Groat   Dietary issues and exercise activities discussed:     Goals Addressed             This Visit's Progress    DIET - INCREASE WATER INTAKE   On track    Try to drink 6-8 glasses of water daily       Depression Screen    12/07/2022    1:50 PM 08/02/2022    1:10 PM 02/19/2022   11:06 AM 12/04/2021    9:53 AM 09/18/2021   11:04 AM 09/10/2021    3:58 PM 03/24/2021    3:49 PM  PHQ 2/9 Scores  PHQ - 2 Score 0 0 0 0 0 0 1  PHQ- 9 Score  0   1 1 3     Fall Risk    12/07/2022    1:49 PM 08/02/2022    1:10 PM 02/19/2022   11:06 AM 12/04/2021    9:49 AM 09/18/2021   11:04 AM  Fall Risk  Falls in the past year? 0 0 0 0 0  Number falls in past yr: 0 0  0   Injury with Fall? 0 0  0   Risk for fall due to : No Fall Risks No Fall Risks  No Fall  Risks   Follow up Falls prevention discussed Education provided  Falls prevention discussed     MEDICARE RISK AT HOME:  Medicare Risk at Home - 12/07/22 1349     Any stairs in or around the home? Yes    If so, are there any without handrails? No    Home free of loose throw rugs in walkways, pet beds, electrical cords, etc? Yes    Adequate lighting in your home to reduce risk of falls? Yes    Life alert? No    Use of a cane, walker or w/c? No    Grab bars in the bathroom? Yes    Shower chair or bench in shower? Yes    Elevated toilet seat or a handicapped toilet? Yes             TIMED UP AND GO:  Was the test performed?  No    Cognitive Function:    11/15/2017   11:31 AM 10/07/2016   11:03 AM  MMSE - Mini Mental State Exam  Orientation to time 5 5  Orientation to Place 5 5  Registration 3 3  Attention/ Calculation 5 5  Recall 3 2  Language- name 2 objects 2 2  Language- repeat 1 1  Language- follow 3 step command 3 3  Language- read & follow direction 1 1  Write a sentence 1 1  Copy design 1 1  Total score 30 29        12/07/2022    1:52 PM 12/04/2021    9:56 AM 12/03/2019   10:42 AM 12/01/2018    9:13 AM  6CIT Screen  What Year? 0 points 0 points 0 points 0 points  What month? 0 points 0 points 0 points 0 points  What time? 0 points 0 points 0 points 0 points  Count back from 20 0 points 0 points 0 points 0 points  Months in reverse 0 points 2 points 0 points 0 points  Repeat phrase 0 points 4 points 0 points 0 points  Total Score 0 points 6 points 0 points 0 points   Per patient no change in vitals since last visit; unable to obtain new vitals due to this being a telehealth visit.  Patient was unable to self-report vital signs via telehealth due to a lack of equipment at home.  Immunizations Immunization History  Administered Date(s) Administered   Covid-19, Mrna,Vaccine(Spikevax)37yrs and older 07/21/2022   Fluad Quad(high Dose 65+) 03/01/2019,  03/04/2020, 02/23/2021, 03/15/2022   Influenza Whole 02/14/2010   Influenza, High Dose Seasonal PF 03/18/2016, 02/17/2017, 03/07/2018   Influenza,inj,Quad PF,6+ Mos 04/03/2013, 03/14/2014, 03/28/2015   Moderna Sars-Covid-2 Vaccination 11/25/2020   PFIZER(Purple Top)SARS-COV-2 Vaccination 06/05/2019, 06/28/2019, 03/19/2020   PNEUMOCOCCAL CONJUGATE-20 09/18/2021   Pneumococcal Conjugate-13 05/15/2013   Pneumococcal Polysaccharide-23 08/15/2009   Td 06/09/2018   Tdap 05/18/2007   Zoster Recombinant(Shingrix) 10/07/2016, 03/24/2017   Zoster, Live 06/09/2006    TDAP status: Up to date  Flu Vaccine status: Up to date  Pneumococcal vaccine status: Up to date  Covid-19 vaccine status: Completed vaccines  Qualifies for Shingles Vaccine? Yes   Zostavax completed Yes   Shingrix Completed?: Yes  Screening Tests Health Maintenance  Topic Date Due  COVID-19 Vaccine (6 - 2023-24 season) 09/15/2022   INFLUENZA VACCINE  12/16/2022   MAMMOGRAM  05/21/2023   Medicare Annual Wellness (AWV)  12/07/2023   DEXA SCAN  08/02/2024   Colonoscopy  11/28/2024   DTaP/Tdap/Td (3 - Td or Tdap) 06/09/2028   Pneumonia Vaccine 51+ Years old  Completed   Hepatitis C Screening  Completed   Zoster Vaccines- Shingrix  Completed   HPV VACCINES  Aged Out    Health Maintenance  Health Maintenance Due  Topic Date Due   COVID-19 Vaccine (6 - 2023-24 season) 09/15/2022    Colorectal cancer screening: Type of screening: Colonoscopy. Completed 11/29/2019. Repeat every 5 years  Mammogram status: Completed 05/20/2022. Repeat every year  Bone Density status: Completed 08/03/2022. Results reflect: Bone density results: OSTEOPOROSIS. Repeat every 2 years.  Lung Cancer Screening: (Low Dose CT Chest recommended if Age 80-80 years, 20 pack-year currently smoking OR have quit w/in 15years.) does not qualify.   Lung Cancer Screening Referral: n/a  Additional Screening:  Hepatitis C Screening: does not qualify;  Completed 03/01/2019  Vision Screening: Recommended annual ophthalmology exams for early detection of glaucoma and other disorders of the eye. Is the patient up to date with their annual eye exam?  Yes  Who is the provider or what is the name of the office in which the patient attends annual eye exams? Dr.groat  If pt is not established with a provider, would they like to be referred to a provider to establish care? No .   Dental Screening: Recommended annual dental exams for proper oral hygiene   Community Resource Referral / Chronic Care Management: CRR required this visit?  No   CCM required this visit?  No     Plan:     I have personally reviewed and noted the following in the patient's chart:   Medical and social history Use of alcohol, tobacco or illicit drugs  Current medications and supplements including opioid prescriptions. Patient is not currently taking opioid prescriptions. Functional ability and status Nutritional status Physical activity Advanced directives List of other physicians Hospitalizations, surgeries, and ER visits in previous 12 months Vitals Screenings to include cognitive, depression, and falls Referrals and appointments  In addition, I have reviewed and discussed with patient certain preventive protocols, quality metrics, and best practice recommendations. A written personalized care plan for preventive services as well as general preventive health recommendations were provided to patient.     Lorrene Reid, LPN   [id number]/61/0960   After Visit Summary: (MyChart) Due to this being a telephonic visit, the after visit summary with patients personalized plan was offered to patient via MyChart   Nurse Notes: none

## 2023-02-11 ENCOUNTER — Encounter: Payer: Medicare Other | Admitting: Family Medicine

## 2023-02-25 ENCOUNTER — Ambulatory Visit: Payer: Medicare Other | Admitting: Family Medicine

## 2023-02-25 ENCOUNTER — Encounter: Payer: Self-pay | Admitting: Family Medicine

## 2023-02-25 VITALS — BP 105/57 | HR 72 | Temp 98.4°F | Ht 65.0 in | Wt 113.0 lb

## 2023-02-25 DIAGNOSIS — Z0001 Encounter for general adult medical examination with abnormal findings: Secondary | ICD-10-CM

## 2023-02-25 DIAGNOSIS — M8580 Other specified disorders of bone density and structure, unspecified site: Secondary | ICD-10-CM

## 2023-02-25 DIAGNOSIS — K582 Mixed irritable bowel syndrome: Secondary | ICD-10-CM | POA: Diagnosis not present

## 2023-02-25 DIAGNOSIS — E034 Atrophy of thyroid (acquired): Secondary | ICD-10-CM | POA: Diagnosis not present

## 2023-02-25 DIAGNOSIS — N8110 Cystocele, unspecified: Secondary | ICD-10-CM

## 2023-02-25 DIAGNOSIS — Z0000 Encounter for general adult medical examination without abnormal findings: Secondary | ICD-10-CM

## 2023-02-25 NOTE — Progress Notes (Signed)
Kayla Randolph is a 77 y.o. female presents to office today for annual physical exam examination.    Concerns today include: 1.  None.  Overall she is been doing well.  She has been having some alternating constipation and diarrhea and currently is experiencing nonbloody diarrhea.  She has been working with probiotics and efforts alleviate some of this.  She watches her diet and tries to stay physically active  Occupation: Retired, Marital status: Married, Substance use: None Health Maintenance Due  Topic Date Due   COVID-19 Vaccine (6 - 2023-24 season) 01/16/2023   Refills needed today: None  Immunization History  Administered Date(s) Administered   Fluad Quad(high Dose 65+) 03/01/2019, 03/04/2020, 02/23/2021, 03/15/2022   Fluad Trivalent(High Dose 65+) 02/21/2023   Influenza Whole 02/14/2010   Influenza, High Dose Seasonal PF 03/18/2016, 02/17/2017, 03/07/2018   Influenza,inj,Quad PF,6+ Mos 04/03/2013, 03/14/2014, 03/28/2015   Moderna Covid-19 Fall Seasonal Vaccine 92yrs & older 07/21/2022   Moderna Sars-Covid-2 Vaccination 11/25/2020   PFIZER(Purple Top)SARS-COV-2 Vaccination 06/05/2019, 06/28/2019, 03/19/2020   PNEUMOCOCCAL CONJUGATE-20 09/18/2021   Pneumococcal Conjugate-13 05/15/2013   Pneumococcal Polysaccharide-23 08/15/2009   Respiratory Syncytial Virus Vaccine,Recomb Aduvanted(Arexvy) 02/21/2023   Td 06/09/2018   Tdap 05/18/2007   Zoster Recombinant(Shingrix) 10/07/2016, 03/24/2017   Zoster, Live 06/09/2006   Past Medical History:  Diagnosis Date   Allergy    Anxiety    Atypical mole 11/23/2002   moderate-right lower back(WS)   Basal cell carcinoma 02/06/2019   nod-left nare (MOHS)   Breast cancer (HCC)    Cataract    Depression    History of colon polyps    Hyperlipidemia    Hypothyroidism    Interstitial cystitis    History of   Osteopenia    Personal history of radiation therapy    S/P lumpectomy, left breast 04/03/2019   Skin cancer    removed from  left nostril   Vitamin D deficiency    Social History   Socioeconomic History   Marital status: Married    Spouse name: Ed   Number of children: 2   Years of education: 16   Highest education level: Bachelor's degree (e.g., BA, AB, BS)  Occupational History   Occupation: Retired    Comment: Software engineer  Tobacco Use   Smoking status: Never   Smokeless tobacco: Never  Vaping Use   Vaping status: Never Used  Substance and Sexual Activity   Alcohol use: Yes    Alcohol/week: 7.0 standard drinks of alcohol    Types: 7 Standard drinks or equivalent per week    Comment: wine nightly   Drug use: No   Sexual activity: Yes    Birth control/protection: Post-menopausal  Other Topics Concern   Not on file  Social History Narrative   Lives with Ed   2 cats    2 children = neither local    Social Determinants of Health   Financial Resource Strain: Low Risk  (12/07/2022)   Overall Financial Resource Strain (CARDIA)    Difficulty of Paying Living Expenses: Not hard at all  Food Insecurity: No Food Insecurity (12/07/2022)   Hunger Vital Sign    Worried About Running Out of Food in the Last Year: Never true    Ran Out of Food in the Last Year: Never true  Transportation Needs: No Transportation Needs (12/07/2022)   PRAPARE - Administrator, Civil Service (Medical): No    Lack of Transportation (Non-Medical): No  Physical Activity: Insufficiently Active (12/07/2022)  Exercise Vital Sign    Days of Exercise per Week: 3 days    Minutes of Exercise per Session: 30 min  Stress: No Stress Concern Present (12/07/2022)   [employer] of Occupational Health - Occupational Stress Questionnaire    Feeling of Stress : Not at all  Social Connections: Socially Integrated (12/07/2022)   Social Connection and Isolation Panel [NHANES]    Frequency of Communication with Friends and Family: More than three times a week    Frequency of Social Gatherings with Friends and Family:  More than three times a week    Attends Religious Services: More than 4 times per year    Active Member of [employer] or Organizations: Yes    Attends Engineer, structural: More than 4 times per year    Marital Status: Married  Catering manager Violence: Not At Risk (12/07/2022)   Humiliation, Afraid, Rape, and Kick questionnaire    Fear of Current or Ex-Partner: No    Emotionally Abused: No    Physically Abused: No    Sexually Abused: No   Past Surgical History:  Procedure Laterality Date   BREAST BIOPSY Left 03/13/2019   BREAST LUMPECTOMY Left 04/03/2019   BREAST LUMPECTOMY WITH RADIOACTIVE SEED AND SENTINEL LYMPH NODE BIOPSY Left 04/03/2019   Procedure: LEFT BREAST LUMPECTOMY WITH RADIOACTIVE SEED AND SENTINEL LYMPH NODE MAPPING;  Surgeon: Harriette Bouillon, MD;  Location: [facility];  Service: General;  Laterality: Left;   CESAREAN SECTION     EYE SURGERY Right    cataract   EYE SURGERY Left    cataract   TONSILLECTOMY     Family History  Problem Relation Age of Onset   Heart attack Father    Congestive Heart Failure Father    Osteoporosis Maternal Aunt    Uterine cancer Maternal Aunt    Cancer Mother        possible lung - spread    Early death Mother    Stroke Maternal Grandmother    Breast cancer Paternal Grandmother    Stroke Paternal Grandfather     Current Outpatient Medications:    Black Pepper-Turmeric (TURMERIC COMPLEX/BLACK PEPPER PO), Take by mouth., Disp: , Rfl:    Cholecalciferol (VITAMIN D3) 1000 UNITS CAPS, Take 2 capsules by mouth daily., Disp: , Rfl:    cycloSPORINE (RESTASIS) 0.05 % ophthalmic emulsion, Place 1 drop into both eyes 2 (two) times daily., Disp: , Rfl:    denosumab (PROLIA) 60 MG/ML SOSY injection, Inject 60 mg into the skin every 6 (six) months., Disp: , Rfl:    escitalopram (LEXAPRO) 10 MG tablet, Take 1 tablet (10 mg total) by mouth daily., Disp: 90 tablet, Rfl: 3   estradiol (ESTRACE) 0.1 MG/GM vaginal cream, INSERT  VAGINALLY AS DIRECTED AT BEDTIME, Disp: 127.5 g, Rfl: 2   fluticasone (FLONASE) 50 MCG/ACT nasal spray, 1 SPRAY IN EACH NOSTRIL EVERY 12 HOURS, Disp: 48 g, Rfl: 1   levothyroxine (SYNTHROID) 25 MCG tablet, TAKE 1 TABLET DAILY BEFORE BREAKFAST, Disp: 90 tablet, Rfl: 2   Multiple Vitamins-Minerals (WOMENS 50+ MULTI VITAMIN/MIN PO), Take by mouth., Disp: , Rfl:    Omega-3 Fatty Acids (SUPER OMEGA-3 PO), Take by mouth., Disp: , Rfl:    Polyethylene Glycol 3350 (MIRALAX PO), Take 1 packet by mouth daily as needed., Disp: , Rfl:    rosuvastatin (CRESTOR) 5 MG tablet, TAKE (1) TABLET DAILY AS DIRECTED., Disp: 90 tablet, Rfl: 1   Specialty Vitamins Products (ONE-A-DAY BONE STRENGTH) 500-28-100 MG-MG-UNIT TABS, Take  1 tablet by mouth 3 (three) times daily., Disp: , Rfl:    tamoxifen (NOLVADEX) 20 MG tablet, TAKE ONE TABLET ONCE DAILY, Disp: 90 tablet, Rfl: 4  Allergies  Allergen Reactions   Macrodantin    Sulfa Antibiotics    Wellbutrin [Bupropion Hcl]      ROS: Review of Systems Pertinent items noted in HPI, positive for urinary incontinence/ difficulty emptying bladder in am Remainder of comprehensive ROS otherwise negative.    Physical exam BP (!) 105/57   Pulse 72   Temp 98.4 F (36.9 C)   Ht 5\' 5"  (1.651 m)   Wt 113 lb (51.3 kg)   SpO2 96%   BMI 18.80 kg/m  General appearance: alert, cooperative, appears stated age, and no distress Head: Normocephalic, without obvious abnormality, atraumatic Eyes: negative findings: lids and lashes normal, conjunctivae and sclerae normal, corneas clear, and pupils equal, round, reactive to light and accomodation Ears: normal TM's and external ear canals both ears Nose: Nares normal. Septum midline. Mucosa normal. No drainage or sinus tenderness. Throat: lips, mucosa, and tongue normal; teeth and gums normal Neck: no adenopathy, supple, symmetrical, trachea midline, and thyroid not enlarged, symmetric, no tenderness/mass/nodules Back: symmetric, no  curvature. ROM normal. No CVA tenderness. Lungs: clear to auscultation bilaterally Heart: regular rate and rhythm, S1, S2 normal, no murmur, click, rub or gallop Abdomen: soft, non-tender; bowel sounds normal; no masses,  no organomegaly Pelvic: cervix normal in appearance, no adnexal masses or tenderness, no cervical motion tenderness, rectovaginal septum normal, vagina normal without discharge, and external vagina atrophied.  She has palpable cystocele with coughing.  No appreciable prolapse Extremities: extremities normal, atraumatic, no cyanosis or edema Pulses: 2+ and symmetric Skin: Skin color, texture, turgor normal. No rashes or lesions Lymph nodes: Cervical, supraclavicular, and axillary nodes normal. Neurologic: Grossly normal      02/25/2023    1:59 PM 12/07/2022    1:50 PM 08/02/2022    1:10 PM  Depression screen PHQ 2/9  Decreased Interest 0 0 0  Down, Depressed, Hopeless 0 0 0  PHQ - 2 Score 0 0 0  Altered sleeping 0  0  Tired, decreased energy 0  0  Change in appetite 0  0  Feeling bad or failure about yourself  0  0  Trouble concentrating 0  0  Moving slowly or fidgety/restless 0  0  Suicidal thoughts 0  0  PHQ-9 Score 0  0  Difficult doing work/chores Not difficult at all  Not difficult at all      02/25/2023    1:59 PM 08/02/2022    1:10 PM 02/19/2022   11:06 AM 09/10/2021    3:59 PM  GAD 7 : Generalized Anxiety Score  Nervous, Anxious, on Edge 0 0 0 1  Control/stop worrying 0 0 0 0  Worry too much - different things 0 0 0 0  Trouble relaxing 0 0 0 0  Restless 0 0 0 0  Easily annoyed or irritable 0 0 0 0  Afraid - awful might happen 0 0 0 0  Total GAD 7 Score 0 0 0 1  Anxiety Difficulty Not difficult at all Not difficult at all Not difficult at all Not difficult at all     Assessment/ Plan: Theadore Nan here for annual physical exam.   Annual physical exam  Osteopenia with high risk of fracture - Plan: CMP14+EGFR  Hypothyroidism due to acquired  atrophy of thyroid - Plan: Lipid panel, TSH, T4, free  Irritable  bowel syndrome with both constipation and diarrhea  Cystocele without uterine prolapse  Will check kidney function, liver enzymes and calcium level.  Future orders for thyroid and cholesterol also placed.  She will schedule a fasting lab appointment.  Continue probiotic for IBS, hydrate  Palpable cystocele on pelvic exam.  No appreciable uterine prolapse.  Discussed consideration for referral to OB/GYN for pessary placement if needed going forward but urinary symptoms are very mild at this point.  Counseled on healthy lifestyle choices, including diet (rich in fruits, vegetables and lean meats and low in salt and simple carbohydrates) and exercise (at least 30 minutes of moderate physical activity daily).  Patient to follow up 29m for thyroid  Lukka Black M. Nadine Counts, DO

## 2023-02-25 NOTE — Patient Instructions (Addendum)

## 2023-03-08 ENCOUNTER — Other Ambulatory Visit: Payer: Medicare Other

## 2023-03-08 DIAGNOSIS — M8580 Other specified disorders of bone density and structure, unspecified site: Secondary | ICD-10-CM | POA: Diagnosis not present

## 2023-03-08 DIAGNOSIS — E034 Atrophy of thyroid (acquired): Secondary | ICD-10-CM | POA: Diagnosis not present

## 2023-03-09 LAB — CMP14+EGFR
ALT: 12 IU/L (ref 0–32)
AST: 23 [IU]/L (ref 0–40)
Albumin: 4 g/dL (ref 3.8–4.8)
Alkaline Phosphatase: 37 IU/L — ABNORMAL LOW (ref 44–121)
BUN/Creatinine Ratio: 19 (ref 12–28)
BUN: 15 mg/dL (ref 8–27)
Bilirubin Total: 0.3 mg/dL (ref 0.0–1.2)
CO2: 25 mmol/L (ref 20–29)
Calcium: 9.2 mg/dL (ref 8.7–10.3)
Chloride: 104 mmol/L (ref 96–106)
Creatinine, Ser: 0.8 mg/dL (ref 0.57–1.00)
Globulin, Total: 2.4 g/dL (ref 1.5–4.5)
Glucose: 80 mg/dL (ref 70–99)
Potassium: 4.1 mmol/L (ref 3.5–5.2)
Sodium: 143 mmol/L (ref 134–144)
Total Protein: 6.4 g/dL (ref 6.0–8.5)
eGFR: 75 mL/min/{1.73_m2} (ref >59)

## 2023-03-09 LAB — TSH: TSH: 4.03 u[IU]/mL (ref 0.450–4.500)

## 2023-03-09 LAB — T4, FREE: Free T4: 1.08 ng/dL (ref 0.82–1.77)

## 2023-03-09 LAB — LIPID PANEL
Chol/HDL Ratio: 2.1 ratio (ref 0.0–4.4)
Cholesterol, Total: 153 mg/dL (ref 100–199)
HDL: 74 mg/dL (ref >39)
LDL Chol Calc (NIH): 60 mg/dL (ref 0–99)
Triglycerides: 104 mg/dL (ref 0–149)
VLDL Cholesterol Cal: 19 mg/dL (ref 5–40)

## 2023-03-16 ENCOUNTER — Telehealth: Payer: Self-pay | Admitting: Family Medicine

## 2023-03-16 DIAGNOSIS — M8580 Other specified disorders of bone density and structure, unspecified site: Secondary | ICD-10-CM

## 2023-03-16 MED ORDER — DENOSUMAB 60 MG/ML ~~LOC~~ SOSY
60.0000 mg | PREFILLED_SYRINGE | Freq: Once | SUBCUTANEOUS | Status: AC
Start: 2023-03-30 — End: 2023-04-07
  Administered 2023-04-07: 60 mg via SUBCUTANEOUS

## 2023-03-16 NOTE — Telephone Encounter (Signed)
Prolia ordered  [id number] with high risk of fracture  M85.80  Provider: Dr. Nadine Counts

## 2023-03-28 ENCOUNTER — Other Ambulatory Visit: Payer: Self-pay | Admitting: Family Medicine

## 2023-04-01 ENCOUNTER — Other Ambulatory Visit: Payer: Self-pay | Admitting: Family Medicine

## 2023-04-07 ENCOUNTER — Ambulatory Visit: Payer: Medicare Other

## 2023-04-07 DIAGNOSIS — M8580 Other specified disorders of bone density and structure, unspecified site: Secondary | ICD-10-CM | POA: Diagnosis not present

## 2023-04-07 NOTE — Progress Notes (Signed)
Prolia injection given to right upper arm, patient tolerated well  Buy and bill

## 2023-04-18 ENCOUNTER — Other Ambulatory Visit: Payer: Self-pay | Admitting: Family Medicine

## 2023-04-25 ENCOUNTER — Encounter: Payer: Self-pay | Admitting: *Deleted

## 2023-05-04 ENCOUNTER — Telehealth: Payer: Self-pay | Admitting: Family Medicine

## 2023-06-07 ENCOUNTER — Ambulatory Visit
Admission: RE | Admit: 2023-06-07 | Discharge: 2023-06-07 | Discharge status: Home / Self Care | Payer: Medicare Other | Source: Ambulatory Visit | Attending: Hematology and Oncology

## 2023-06-07 DIAGNOSIS — Z853 Personal history of malignant neoplasm of breast: Secondary | ICD-10-CM | POA: Diagnosis not present

## 2023-06-07 DIAGNOSIS — C50212 Malignant neoplasm of upper-inner quadrant of left female breast: Secondary | ICD-10-CM

## 2023-06-12 ENCOUNTER — Encounter (HOSPITAL_BASED_OUTPATIENT_CLINIC_OR_DEPARTMENT_OTHER): Payer: Self-pay | Admitting: Emergency Medicine

## 2023-06-12 ENCOUNTER — Emergency Department (HOSPITAL_BASED_OUTPATIENT_CLINIC_OR_DEPARTMENT_OTHER)
Admission: EM | Admit: 2023-06-12 | Discharge: 2023-06-12 | Disposition: A | Discharge status: Home / Self Care | Payer: Medicare Other | Attending: Emergency Medicine | Admitting: Emergency Medicine

## 2023-06-12 ENCOUNTER — Other Ambulatory Visit: Payer: Self-pay

## 2023-06-12 DIAGNOSIS — R059 Cough, unspecified: Secondary | ICD-10-CM | POA: Diagnosis present

## 2023-06-12 DIAGNOSIS — J101 Influenza due to other identified influenza virus with other respiratory manifestations: Secondary | ICD-10-CM | POA: Insufficient documentation

## 2023-06-12 DIAGNOSIS — Z20822 Contact with and (suspected) exposure to covid-19: Secondary | ICD-10-CM | POA: Insufficient documentation

## 2023-06-12 DIAGNOSIS — I10 Essential (primary) hypertension: Secondary | ICD-10-CM | POA: Diagnosis not present

## 2023-06-12 LAB — RESP PANEL BY RT-PCR (RSV, FLU A&B, COVID)  RVPGX2
Influenza A by PCR: POSITIVE — AB
Influenza B by PCR: NEGATIVE
Resp Syncytial Virus by PCR: NEGATIVE
SARS Coronavirus 2 by RT PCR: NEGATIVE

## 2023-06-12 MED ORDER — ACETAMINOPHEN 325 MG PO TABS
650.0000 mg | ORAL_TABLET | Freq: Once | ORAL | Status: AC | PRN
  Administered 2023-06-12: 650 mg via ORAL
  Filled 2023-06-12: qty 2

## 2023-06-12 MED ORDER — OSELTAMIVIR PHOSPHATE 75 MG PO CAPS: 75.0000 mg | ORAL_CAPSULE | Freq: Two times a day (BID) | ORAL | 0 refills | Status: DC

## 2023-06-12 MED ORDER — BENZONATATE 100 MG PO CAPS: 100.0000 mg | ORAL_CAPSULE | Freq: Three times a day (TID) | ORAL | 0 refills | Status: DC | PRN

## 2023-06-12 NOTE — ED Provider Notes (Signed)
[place] EMERGENCY DEPARTMENT AT [facility] Provider Note   CSN: [id number] Arrival date & time: 06/12/23  1551     History  Chief Complaint  Patient presents with   Cough   Generalized Body Aches    Kayla Randolph is a 78 y.o. female history of hypertension, hyperlipidemia, here presenting with flu syndrome.  Patient states that she had a shopping trip for a church dinner several days ago.  She states that she went to [place] and [employer] for that trip.  She states that for the last 2 days she has been having coughing and bodyaches and headaches.  She also has some fevers as well.  She states that she did get the flu shot and RSV shot.  The history is provided by the patient.       Home Medications Prior to Admission medications   Medication Sig Start Date End Date Taking? Authorizing Provider  Black Pepper-Turmeric (TURMERIC COMPLEX/BLACK PEPPER PO) Take by mouth.    [provider]  Cholecalciferol (VITAMIN D3) 1000 UNITS CAPS Take 2 capsules by mouth daily.    [provider]  cycloSPORINE (RESTASIS) 0.05 % ophthalmic emulsion Place 1 drop into both eyes 2 (two) times daily.    [provider]  denosumab (PROLIA) 60 MG/ML SOSY injection Inject 60 mg into the skin every 6 (six) months.    [provider]  escitalopram (LEXAPRO) 10 MG tablet TAKE ONE TABLET ONCE DAILY 04/18/23   Delynn Flavin M, DO  estradiol (ESTRACE) 0.1 MG/GM vaginal cream INSERT VAGINALLY AS DIRECTED AT BEDTIME 04/02/22   Gottschalk, Kathie Rhodes M, DO  fluticasone (FLONASE) 50 MCG/ACT nasal spray 1 SPRAY IN EACH NOSTRIL EVERY 12 HOURS 10/05/22   Delynn Flavin M, DO  levothyroxine (SYNTHROID) 25 MCG tablet TAKE 1 TABLET DAILY BEFORE BREAKFAST 04/01/23   [name], DO  Multiple Vitamins-Minerals (WOMENS 50+ MULTI VITAMIN/MIN PO) Take by mouth.    [provider]  Omega-3 Fatty Acids (SUPER OMEGA-3 PO) Take by mouth.    [provider]   Polyethylene Glycol 3350 (MIRALAX PO) Take 1 packet by mouth daily as needed.    [provider]  rosuvastatin (CRESTOR) 5 MG tablet TAKE ONE TABLET DAILY AS DIRECTED 03/28/23   [name], DO  Specialty Vitamins Products (ONE-A-DAY BONE STRENGTH) 500-28-100 MG-MG-UNIT TABS Take 1 tablet by mouth 3 (three) times daily.    [provider]  tamoxifen (NOLVADEX) 20 MG tablet TAKE ONE TABLET ONCE DAILY 10/04/22   Rachel Moulds, MD      Allergies    Macrodantin, Sulfa antibiotics, and Wellbutrin [bupropion hcl]    Review of Systems   Review of Systems  Respiratory:  Positive for cough.   All other systems reviewed and are negative.   Physical Exam Updated Vital Signs BP 134/67 (BP Location: Right Arm)   Pulse 84   Temp (!) 103 F (39.4 C)   Resp 18   Ht 5\' 5"  (1.651 m)   Wt 51.3 kg   SpO2 97%   BMI 18.82 kg/m  Physical Exam Vitals and nursing note reviewed.  Constitutional:      Appearance: Normal appearance.  HENT:     Head: Normocephalic.     Nose: Nose normal.     Mouth/Throat:     Mouth: Mucous membranes are moist.  Eyes:     Extraocular Movements: Extraocular movements intact.     Pupils: Pupils are equal, round, and reactive to light.  Cardiovascular:  Rate and Rhythm: Normal rate and regular rhythm.     Pulses: Normal pulses.     Heart sounds: Normal heart sounds.  Pulmonary:     Effort: Pulmonary effort is normal.     Breath sounds: Normal breath sounds.     Comments: No wheezing or crackles Abdominal:     General: Abdomen is flat.     Palpations: Abdomen is soft.  Musculoskeletal:        General: Normal range of motion.     Cervical back: Normal range of motion and neck supple.  Skin:    General: Skin is warm.     Capillary Refill: Capillary refill takes less than 2 seconds.  Neurological:     General: No focal deficit present.     Mental Status: She is alert.  Psychiatric:        Mood and Affect: Mood normal.     ED  Results / Procedures / Treatments   Labs (all labs ordered are listed, but only abnormal results are displayed) Labs Reviewed  RESP PANEL BY RT-PCR (RSV, FLU A&B, COVID)  [id number] - Abnormal; Notable for the following components:      Result Value   Influenza A by PCR POSITIVE (*)    All other components within normal limits    EKG None  Radiology No results found.  Procedures Procedures    Medications Ordered in ED Medications  acetaminophen (TYLENOL) tablet 650 mg (650 mg Oral Given 06/12/23 1639)    ED Course/ Medical Decision Making/ A&P                                 Medical Decision Making Kayla Randolph is a 78 y.o. female here presenting with bodyaches and fever and headaches.  Patient is febrile 103 in triage.  Patient is flu a positive.  Lungs are clear.  I think her fever and cough is from flu.  She is within the window for Tamiflu.  Will prescribe Tamiflu and Tessalon Perles.  Gave strict return cautions   Problems Addressed: Influenza A: acute illness or injury  Risk OTC drugs.    Final Clinical Impression(s) / ED Diagnoses Final diagnoses:  Influenza A    Rx / DC Orders ED Discharge Orders     None         Charlynne Pander, MD 06/12/23 Kristopher Oppenheim

## 2023-06-12 NOTE — Discharge Instructions (Signed)
Please take Tamiflu as prescribed for influenza A  Take Tessalon Perles as needed  See your doctor for follow-up  Return to ER if you have worse cough or fever or trouble breathing

## 2023-06-12 NOTE — ED Triage Notes (Signed)
Pt via pov from home with coughing, body aches, headache since Friday. Pt reports fever as well. Pt alert & oriented, nad noted.

## 2023-06-22 ENCOUNTER — Other Ambulatory Visit: Payer: Self-pay | Admitting: Family Medicine

## 2023-06-22 ENCOUNTER — Telehealth: Payer: Self-pay

## 2023-06-22 NOTE — Progress Notes (Signed)
 Transition Care Management Follow-up Telephone Call Date of discharge and from where: [facility] 1/26 How have you been since you were released from the hospital? Patient has not followed up with provider yet  Any questions or concerns? No  Items Reviewed: Did the pt receive and understand the discharge instructions provided? Yes  Medications obtained and verified? Yes  Other? No  Any new allergies since your discharge? No  Dietary orders reviewed? No Do you have support at home? Yes    Follow up appointments reviewed:  PCP Hospital f/u appt confirmed? No Scheduled to see  on  @ . Specialist Hospital f/u appt confirmed? No  Scheduled to see  on   . Are transportation arrangements needed? No  If their condition worsens, is the pt aware to call PCP or go to the Emergency Dept.? Yes Was the patient provided with contact information for the PCP's office or ED? Yes Was to pt encouraged to call back with questions or concerns? Yes

## 2023-08-09 ENCOUNTER — Telehealth: Payer: Self-pay | Admitting: Family Medicine

## 2023-08-09 NOTE — Telephone Encounter (Signed)
 Please offer ABIs (can be done at [facility] or [place]) to this patient to further evaluate this abnormal at home test.  I'm glad to order to verify validity of screening at home.

## 2023-08-09 NOTE — Telephone Encounter (Signed)
 Copied from CRM [id number]. Topic: Clinical - Medical Advice >> Aug 09, 2023  9:38 AM Carlatta H wrote: Reason for CRM: PAD Screening results right leg 0.70 left leg 0.82//Mild issue

## 2023-08-10 NOTE — Telephone Encounter (Signed)
 Left vm for cb

## 2023-08-25 ENCOUNTER — Encounter: Payer: Self-pay | Admitting: Family Medicine

## 2023-08-25 ENCOUNTER — Ambulatory Visit (INDEPENDENT_AMBULATORY_CARE_PROVIDER_SITE_OTHER): Payer: Medicare Other | Admitting: Family Medicine

## 2023-08-25 VITALS — BP 126/74 | HR 74 | Temp 98.6°F | Ht 65.0 in | Wt 112.2 lb

## 2023-08-25 DIAGNOSIS — B070 Plantar wart: Secondary | ICD-10-CM

## 2023-08-25 DIAGNOSIS — R6889 Other general symptoms and signs: Secondary | ICD-10-CM

## 2023-08-25 DIAGNOSIS — E034 Atrophy of thyroid (acquired): Secondary | ICD-10-CM

## 2023-08-25 DIAGNOSIS — R21 Rash and other nonspecific skin eruption: Secondary | ICD-10-CM

## 2023-08-25 DIAGNOSIS — F411 Generalized anxiety disorder: Secondary | ICD-10-CM

## 2023-08-25 DIAGNOSIS — R059 Cough, unspecified: Secondary | ICD-10-CM | POA: Diagnosis not present

## 2023-08-25 DIAGNOSIS — M8580 Other specified disorders of bone density and structure, unspecified site: Secondary | ICD-10-CM | POA: Diagnosis not present

## 2023-08-25 MED ORDER — DESLORATADINE 5 MG PO TABS: 5.0000 mg | ORAL_TABLET | Freq: Every day | ORAL | 3 refills | Status: DC | PRN

## 2023-08-25 MED ORDER — BUSPIRONE HCL 5 MG PO TABS: 5.0000 mg | ORAL_TABLET | Freq: Two times a day (BID) | ORAL | 0 refills | Status: DC

## 2023-08-25 MED ORDER — TRIAMCINOLONE ACETONIDE 0.1 % EX CREA: 1.0000 | TOPICAL_CREAM | Freq: Two times a day (BID) | CUTANEOUS | 0 refills | Status: DC | PRN

## 2023-08-25 NOTE — Progress Notes (Signed)
 Subjective: CC: Follow-up hypothyroidism, osteopenia PCP: [name], DO MVH:QIONG H Bertholf is a 78 y.o. female presenting to clinic today for:  1.  Hypothyroidism Compliant with Synthroid.  No reports of tremor, heart palpitations or changes in bowel habits.  However, she has seen some intermittent spells of anxiety that occur outside of use of Lexapro.  This typically seems to be situational but she is wondering if perhaps there is something other that might be of to help her  2.  Rash She reports a rash along her neck that has been present for about 2 months now.  She has been utilizing the CeraVe eczema cream when she remembers to use it.  She denies any known exposures, new detergents, new jewelry.  Not taking any oral antihistamines  3.  Foot spot She reports a spot on the bottom of her right foot.  She denies any pain.  She noticed it a while back but was not sure what it was.  She has not put anything on it.  4.  Recent home health visit She had a home health visit with her insurance recently and they did ultrasounds of her feet and told her they were abnormal and they needed follow-up.  She does report occasional swelling in her feet but no other concerning symptoms or signs  5.  Osteopenia She tries to get adequate vitamin D and calcium through diet but does take a vitamin D supplement.  Currently being treated with Prolia for osteopenia with high fracture risk.  She reports no bony pain or other concerning symptoms  6.  Cough She reports that she has had some intermittent cough that is not especially bothersome but worries her husband.  It was much worse in the fall but is coming back a little bit now.  She does not know any specific triggers.  She denies any hemoptysis.  No shortness of breath or wheezing reported.   ROS: Per HPI  Allergies  Allergen Reactions   Macrodantin    Sulfa Antibiotics    Wellbutrin [Bupropion Hcl]    Past Medical History:  Diagnosis  Date   Allergy    Anxiety    Atypical mole 11/23/2002   moderate-right lower back([facility])   Basal cell carcinoma 02/06/2019   nod-left nare ([facility])   Breast cancer (HCC)    Cataract    Depression    History of colon polyps    Hyperlipidemia    Hypothyroidism    Interstitial cystitis    History of   Osteopenia    Personal history of radiation therapy    S/P lumpectomy, left breast 04/03/2019   Skin cancer    removed from left nostril   Vitamin D deficiency     Current Outpatient Medications:    benzonatate (TESSALON) 100 MG capsule, Take 1 capsule (100 mg total) by mouth 3 (three) times daily as needed for cough., Disp: 21 capsule, Rfl: 0   Black Pepper-Turmeric (TURMERIC COMPLEX/BLACK PEPPER PO), Take by mouth., Disp: , Rfl:    Cholecalciferol (VITAMIN D3) 1000 UNITS CAPS, Take 2 capsules by mouth daily., Disp: , Rfl:    cycloSPORINE (RESTASIS) 0.05 % ophthalmic emulsion, Place 1 drop into both eyes 2 (two) times daily., Disp: , Rfl:    denosumab (PROLIA) 60 MG/ML SOSY injection, Inject 60 mg into the skin every 6 (six) months., Disp: , Rfl:    escitalopram (LEXAPRO) 10 MG tablet, TAKE ONE TABLET ONCE DAILY, Disp: 90 tablet, Rfl: 1   estradiol (  ESTRACE) 0.1 MG/GM vaginal cream, INSERT VAGINALLY AS DIRECTED AT BEDTIME, Disp: 127.5 g, Rfl: 0   fluticasone (FLONASE) 50 MCG/ACT nasal spray, 1 SPRAY IN EACH NOSTRIL EVERY 12 HOURS, Disp: 48 g, Rfl: 1   levothyroxine (SYNTHROID) 25 MCG tablet, TAKE 1 TABLET DAILY BEFORE BREAKFAST, Disp: 90 tablet, Rfl: 3   Multiple Vitamins-Minerals (WOMENS 50+ MULTI VITAMIN/MIN PO), Take by mouth., Disp: , Rfl:    Omega-3 Fatty Acids (SUPER OMEGA-3 PO), Take by mouth., Disp: , Rfl:    oseltamivir (TAMIFLU) 75 MG capsule, Take 1 capsule (75 mg total) by mouth every 12 (twelve) hours., Disp: 10 capsule, Rfl: 0   Polyethylene Glycol 3350 (MIRALAX PO), Take 1 packet by mouth daily as needed., Disp: , Rfl:    rosuvastatin (CRESTOR) 5 MG tablet, TAKE ONE TABLET  DAILY AS DIRECTED, Disp: 90 tablet, Rfl: 1   Specialty Vitamins Products (ONE-A-DAY BONE STRENGTH) 500-28-100 MG-MG-UNIT TABS, Take 1 tablet by mouth 3 (three) times daily., Disp: , Rfl:    tamoxifen (NOLVADEX) 20 MG tablet, TAKE ONE TABLET ONCE DAILY, Disp: 90 tablet, Rfl: 4 Social History   Socioeconomic History   Marital status: Married    Spouse name: Ed   Number of children: 2   Years of education: 16   Highest education level: Bachelor's degree (e.g., BA, AB, BS)  Occupational History   Occupation: Retired    Comment: Software engineer  Tobacco Use   Smoking status: Never   Smokeless tobacco: Never  Vaping Use   Vaping status: Never Used  Substance and Sexual Activity   Alcohol use: Yes    Alcohol/week: 7.0 standard drinks of alcohol    Types: 7 Standard drinks or equivalent per week    Comment: wine nightly   Drug use: No   Sexual activity: Yes    Birth control/protection: Post-menopausal  Other Topics Concern   Not on file  Social History Narrative   Lives with Ed   2 cats    2 children = neither local    Social Drivers of Health   Financial Resource Strain: Low Risk  (12/07/2022)   Overall Financial Resource Strain (CARDIA)    Difficulty of Paying Living Expenses: Not hard at all  Food Insecurity: No Food Insecurity (12/07/2022)   Hunger Vital Sign    Worried About Running Out of Food in the Last Year: Never true    Ran Out of Food in the Last Year: Never true  Transportation Needs: No Transportation Needs (12/07/2022)   PRAPARE - Administrator, Civil Service (Medical): No    Lack of Transportation (Non-Medical): No  Physical Activity: Insufficiently Active (12/07/2022)   Exercise Vital Sign    Days of Exercise per Week: 3 days    Minutes of Exercise per Session: 30 min  Stress: No Stress Concern Present (12/07/2022)   [employer] of Occupational Health - Occupational Stress Questionnaire    Feeling of Stress : Not at all  Social  Connections: Socially Integrated (12/07/2022)   Social Connection and Isolation Panel [NHANES]    Frequency of Communication with Friends and Family: More than three times a week    Frequency of Social Gatherings with Friends and Family: More than three times a week    Attends Religious Services: More than 4 times per year    Active Member of [employer] or Organizations: Yes    Attends Engineer, structural: More than 4 times per year    Marital Status: Married  Intimate Partner Violence: Not At Risk (12/07/2022)   Humiliation, Afraid, Rape, and Kick questionnaire    Fear of Current or Ex-Partner: No    Emotionally Abused: No    Physically Abused: No    Sexually Abused: No   Family History  Problem Relation Age of Onset   Heart attack Father    Congestive Heart Failure Father    Osteoporosis Maternal Aunt    Uterine cancer Maternal Aunt    Cancer Mother        possible lung - spread    Early death Mother    Stroke Maternal Grandmother    Breast cancer Paternal Grandmother    Stroke Paternal Grandfather     Objective: Office vital signs reviewed. BP 126/74   Pulse 74   Temp 98.6 F (37 C)   Ht 5\' 5"  (1.651 m)   Wt 112 lb 3.2 oz (50.9 kg)   SpO2 99%   BMI 18.67 kg/m   Physical Examination:  General: Awake, alert, well-appearing female, No acute distress HEENT: Sclera white.  No exophthalmos. Cardio: regular rate and rhythm, S1S2 heard, no murmurs appreciated Pulm: clear to auscultation bilaterally, no wheezes, rhonchi or rales; normal work of breathing on room air Skin: Erythematous, blanching rash noted along the dcolletage and neck.  No papules or vesicles. Extremities: Plantar wart noted along the forefoot on the right between digits 3 and 4.  +2 pedal pulses bilaterally     08/25/2023   11:47 AM 02/25/2023    1:59 PM 12/07/2022    1:50 PM  Depression screen PHQ 2/9  Decreased Interest 0 0 0  Down, Depressed, Hopeless 0 0 0  PHQ - 2 Score 0 0 0  Altered  sleeping 0 0   Tired, decreased energy 1 0   Change in appetite 0 0   Feeling bad or failure about yourself  0 0   Trouble concentrating 0 0   Moving slowly or fidgety/restless 0 0   Suicidal thoughts 0 0   PHQ-9 Score 1 0   Difficult doing work/chores  Not difficult at all       08/25/2023   11:47 AM 02/25/2023    1:59 PM 08/02/2022    1:10 PM 02/19/2022   11:06 AM  GAD 7 : Generalized Anxiety Score  Nervous, Anxious, on Edge 1 0 0 0  Control/stop worrying 1 0 0 0  Worry too much - different things 0 0 0 0  Trouble relaxing 0 0 0 0  Restless 0 0 0 0  Easily annoyed or irritable 1 0 0 0  Afraid - awful might happen 0 0 0 0  Total GAD 7 Score 3 0 0 0  Anxiety Difficulty Not difficult at all Not difficult at all Not difficult at all Not difficult at all   Assessment/ Plan: 78 y.o. female   Hypothyroidism due to acquired atrophy of thyroid - Plan: TSH + free T4  Osteopenia with high risk of fracture - Plan: VITAMIN D 25 Hydroxy (Vit-D Deficiency, Fractures), CMP14+EGFR  Cough, unspecified type - Plan: desloratadine (CLARINEX) 5 MG tablet  Rash - Plan: desloratadine (CLARINEX) 5 MG tablet, triamcinolone cream (KENALOG) 0.1 %  Plantar wart of right foot  Abnormal ankle brachial index (ABI) - Plan: US ARTERIAL ABI (SCREENING LOWER EXTREMITY)  Generalized anxiety disorder - Plan: busPIRone (BUSPAR) 5 MG tablet  Clinically euthyroid.  Check thyroid levels.  Uncertain if intermittent anxiety is totally situational or reflective of thyroid disorder.  Will add  BuSpar at low-dose  Check vitamin D, calcium level.  Continue Prolia  Trial of Clarinex for rash on chest and to maybe help with any allergy mediated symptoms causing cough.  Discussed alternative Singulair  I have also given her triamcinolone cream as needed rash  Lesion on the foot appears to be plantar wart.  I offered cryoablation versus duct tape method.  She is going to try topicals OTC  Mild abnormality noted on  the screening ABI with her home health nurse.  Formal ABIs ordered but I suspect that they will likely be normal   Sonda Coppens Hulen Skains, DO [facility] [phone]

## 2023-08-25 NOTE — Patient Instructions (Signed)
Warts  Warts are small growths on the skin. They are common, and they are caused by a virus. Warts can be found on many parts of the body. A person may have one wart or many warts. Most warts will go away on their own with time, but this could take many months to a few years. If needed, warts can be treated. What are the causes? A type of virus called HPV causes warts. HPV can spread from person to person through touching. Warts can also spread to other parts of the body when a person scratches a wart and then scratches another part of his or her body. What increases the risk? You are more likely to get warts if: You are 10-20 years old. You have a weak body defense system (immune system). You are Caucasian. What are the signs or symptoms? The main symptom of this condition is small growths on the skin. Warts may: Have different shapes. They may be round, oval, or uneven. Feel rough to the touch. Be the color of your skin or light yellow, brown, or gray. Often be less than  inch (1.3 cm) in size. Go away and then come back again. Most warts do not hurt, but some can hurt if they are large or if they are on the bottom of your feet. How is this diagnosed? A wart can often be diagnosed by how it looks. In some cases, the doctor might remove a little bit of the wart to test it (biopsy). How is this treated? Most of the time, warts do not need treatment. Sometimes people want warts removed. If treatment is needed or wanted, it may include: Putting creams or patches with medicine in them on the wart. Putting duct tape over the top of the wart. Freezing the wart. Burning the wart with: A laser. An electric probe. Giving a shot of medicine into the wart to help the body's defense system fight off the wart. Surgery to remove the wart. Follow these instructions at home:  Medicines Use over-the-counter and prescription medicines only as told by your doctor. Do not use over-the-counter wart  medicines on your face or genitals before asking your doctor. Lifestyle Keep your body's defense system healthy. To do this: Eat a healthy diet. Get enough sleep. Do not smoke or use any products that contain nicotine or tobacco. If you need help quitting, ask your doctor. General instructions Wash your hands after you touch a wart. Do not scratch or pick at a wart. Avoid shaving hair that is over a wart. Keep all follow-up visits. Contact a doctor if: Your warts do not get better after treatment. You have redness, swelling, or pain at the site of a wart. Your wart is bleeding, and the bleeding does not stop when you lightly press on the wart. You have diabetes and you get a wart. Summary Warts are small growths on the skin. They are common and are caused by a virus. Most of the time, warts do not need treatment. Sometimes people want warts removed. If treatment is needed or wanted, there are many options. Apply over-the-counter and prescription medicines only as told by your doctor. Wash your hands after you touch a wart. Keep all follow-up visits. This information is not intended to replace advice given to you by your health care provider. Make sure you discuss any questions you have with your health care provider. Document Revised: 06/05/2021 Document Reviewed: 06/05/2021 Elsevier Patient Education  2024 Elsevier Inc.  

## 2023-08-26 ENCOUNTER — Ambulatory Visit: Payer: Medicare Other | Admitting: Family Medicine

## 2023-08-26 LAB — CMP14+EGFR
ALT: 13 IU/L (ref 0–32)
AST: 21 IU/L (ref 0–40)
Albumin: 4.1 g/dL (ref 3.8–4.8)
Alkaline Phosphatase: 34 IU/L — ABNORMAL LOW (ref 44–121)
BUN/Creatinine Ratio: 24 (ref 12–28)
BUN: 17 mg/dL (ref 8–27)
Bilirubin Total: 0.2 mg/dL (ref 0.0–1.2)
CO2: 24 mmol/L (ref 20–29)
Calcium: 9.6 mg/dL (ref 8.7–10.3)
Chloride: 105 mmol/L (ref 96–106)
Creatinine, Ser: 0.71 mg/dL (ref 0.57–1.00)
Globulin, Total: 2.1 g/dL (ref 1.5–4.5)
Glucose: 80 mg/dL (ref 70–99)
Potassium: 4.3 mmol/L (ref 3.5–5.2)
Sodium: 143 mmol/L (ref 134–144)
Total Protein: 6.2 g/dL (ref 6.0–8.5)
eGFR: 86 mL/min/{1.73_m2} (ref >59)

## 2023-08-26 LAB — TSH+FREE T4
Free T4: 0.99 ng/dL (ref 0.82–1.77)
TSH: 2.24 u[IU]/mL (ref 0.450–4.500)

## 2023-08-26 LAB — VITAMIN D 25 HYDROXY (VIT D DEFICIENCY, FRACTURES): Vit D, 25-Hydroxy: 55.9 ng/mL (ref 30.0–100.0)

## 2023-08-29 ENCOUNTER — Encounter: Payer: Self-pay | Admitting: Family Medicine

## 2023-09-12 ENCOUNTER — Ambulatory Visit (HOSPITAL_BASED_OUTPATIENT_CLINIC_OR_DEPARTMENT_OTHER)

## 2023-09-12 ENCOUNTER — Other Ambulatory Visit: Payer: Self-pay

## 2023-09-12 ENCOUNTER — Telehealth: Payer: Self-pay

## 2023-09-12 DIAGNOSIS — M8580 Other specified disorders of bone density and structure, unspecified site: Secondary | ICD-10-CM

## 2023-09-12 DIAGNOSIS — M810 Age-related osteoporosis without current pathological fracture: Secondary | ICD-10-CM

## 2023-09-12 MED ORDER — DENOSUMAB 60 MG/ML ~~LOC~~ SOSY
60.0000 mg | PREFILLED_SYRINGE | Freq: Once | SUBCUTANEOUS | Status: AC
  Administered 2023-11-02: 60 mg via SUBCUTANEOUS

## 2023-09-12 NOTE — Telephone Encounter (Signed)
 Prolia  VOB initiated via [email or web address]  Next Prolia  inj DUE: 10/05/23

## 2023-09-12 NOTE — Progress Notes (Signed)
 Sent to PA team for benefit verification

## 2023-09-13 ENCOUNTER — Other Ambulatory Visit (HOSPITAL_COMMUNITY): Payer: Self-pay

## 2023-09-13 NOTE — Telephone Encounter (Signed)
 Kayla Randolph

## 2023-09-13 NOTE — Telephone Encounter (Signed)
 Pt ready for scheduling for PROLIA  on or after : 10/05/23  Option# 1: Buy/Bill (Office supplied medication)  Out-of-pocket cost due at time of clinic visit: $352  Number of injection/visits approved: 2  Primary: [employer]HC MEDICARE Prolia  co-insurance: 20% Admin fee co-insurance: $20  Secondary: --- Prolia  co-insurance:  Admin fee co-insurance:   Medical Benefit Details: Date Benefits were checked: 09/12/23 Deductible: NO/ Coinsurance: 20%/ Admin Fee: $20  Prior Auth: APPROVED PA# [id number] Expiration Date: 10/05/23-10/04/24  # of doses approved: 2 ----------------------------------------------------------------------- Option# 2- Med Obtained from pharmacy:  Pharmacy benefit: Copay $300 (Paid to pharmacy) Admin Fee: $20 (Pay at clinic)  Prior Auth: N/A PA# Expiration Date:   # of doses approved:   If patient wants fill through the pharmacy benefit please send prescription to: [facility], and include estimated need by date in rx notes. Pharmacy will ship medication directly to the office.  Patient NOT eligible for Prolia  Copay Card. Copay Card can make patient's cost as little as $25. Link to apply: [email or web address]  ** This summary of benefits is an estimation of the patient's out-of-pocket cost. Exact cost may very based on individual plan coverage.

## 2023-09-16 ENCOUNTER — Ambulatory Visit (HOSPITAL_COMMUNITY)
Admission: RE | Admit: 2023-09-16 | Discharge: 2023-09-16 | Disposition: A | Discharge status: Home / Self Care | Source: Ambulatory Visit | Attending: Family Medicine | Admitting: Family Medicine

## 2023-09-16 DIAGNOSIS — R6889 Other general symptoms and signs: Secondary | ICD-10-CM | POA: Diagnosis not present

## 2023-09-16 DIAGNOSIS — E785 Hyperlipidemia, unspecified: Secondary | ICD-10-CM | POA: Diagnosis not present

## 2023-09-23 NOTE — Telephone Encounter (Signed)
 A month old will close

## 2023-10-06 ENCOUNTER — Other Ambulatory Visit: Payer: Self-pay

## 2023-10-06 DIAGNOSIS — M810 Age-related osteoporosis without current pathological fracture: Secondary | ICD-10-CM

## 2023-10-06 MED ORDER — DENOSUMAB 60 MG/ML ~~LOC~~ SOSY: 60.0000 mg | PREFILLED_SYRINGE | Freq: Once | SUBCUTANEOUS | 0 refills | Status: AC

## 2023-10-11 ENCOUNTER — Other Ambulatory Visit: Payer: Self-pay | Admitting: Family Medicine

## 2023-10-12 ENCOUNTER — Telehealth: Payer: Self-pay

## 2023-10-12 NOTE — Telephone Encounter (Signed)
 Verbally confirmed appointment for 5/29

## 2023-10-13 ENCOUNTER — Inpatient Hospital Stay: Payer: Medicare Other | Attending: Hematology and Oncology | Admitting: Hematology and Oncology

## 2023-10-13 VITALS — BP 136/73 | HR 66 | Temp 98.3°F | Resp 17 | Ht 65.0 in | Wt 113.9 lb

## 2023-10-13 DIAGNOSIS — Z1721 Progesterone receptor positive status: Secondary | ICD-10-CM | POA: Diagnosis not present

## 2023-10-13 DIAGNOSIS — Z803 Family history of malignant neoplasm of breast: Secondary | ICD-10-CM | POA: Diagnosis not present

## 2023-10-13 DIAGNOSIS — Z923 Personal history of irradiation: Secondary | ICD-10-CM | POA: Diagnosis not present

## 2023-10-13 DIAGNOSIS — Z1732 Human epidermal growth factor receptor 2 negative status: Secondary | ICD-10-CM | POA: Insufficient documentation

## 2023-10-13 DIAGNOSIS — Z8049 Family history of malignant neoplasm of other genital organs: Secondary | ICD-10-CM | POA: Diagnosis not present

## 2023-10-13 DIAGNOSIS — Z79810 Long term (current) use of selective estrogen receptor modulators (SERMs): Secondary | ICD-10-CM | POA: Diagnosis not present

## 2023-10-13 DIAGNOSIS — Z853 Personal history of malignant neoplasm of breast: Secondary | ICD-10-CM

## 2023-10-13 DIAGNOSIS — Z170 Estrogen receptor positive status [ER+]: Secondary | ICD-10-CM

## 2023-10-13 DIAGNOSIS — C50212 Malignant neoplasm of upper-inner quadrant of left female breast: Secondary | ICD-10-CM | POA: Diagnosis not present

## 2023-10-13 MED ORDER — TAMOXIFEN CITRATE 20 MG PO TABS: 20.0000 mg | ORAL_TABLET | Freq: Every day | ORAL | 4 refills | Status: AC

## 2023-10-13 NOTE — Progress Notes (Signed)
 [facility]  [phone] [phone]     ID: Kayla Randolph DOB: 17-Aug-1945  MR#: [id number]  HYQ#:[id number]  Patient Care Team: Eliodoro Guerin, DO as PCP - General (Family Medicine) Ozell Blunt, MD as Consulting Physician (Gastroenterology) Devon Fogo, MD (Inactive) as Consulting Physician (Dermatology) Auther Bo, RN as Oncology Nurse Navigator Alane Hsu, RN as Oncology Nurse Navigator Magrinat, Rozella Cornfield, MD (Inactive) as Consulting Physician (Oncology) Albert Huff, MD as Consulting Physician (Ophthalmology) Johna Myers, MD as Consulting Physician (Radiation Oncology) Sim Dryer, MD as Consulting Physician (General Surgery) Murleen Arms, MD OTHER MD:  CHIEF COMPLAINT: estrogen receptor positive breast cancer  CURRENT TREATMENT: Tamoxifen   INTERVAL HISTORY: Kayla Randolph returns today for follow up of her estrogen receptor positive breast cancer.  She started tamoxifen  on 07/02/2019.  She is tolerating it well. No adverse effects.  Last mammogram in January 2025, no evidence of malignancy.  Breast density category C.  Discussed the use of AI scribe software for clinical note transcription with the patient, who gave verbal consent to proceed.  History of Present Illness Kayla Randolph is a 78 year old female with early stage breast cancer who presents for follow-up on tamoxifen  therapy.  She has been on tamoxifen  since February 2021 for early stage breast cancer without noticeable side effects. Recently, she ran out of tamoxifen  and was unsure whether to refill it. Her last mammogram in January showed no concerning changes, although she continues to have dense breast tissue.  She is currently using vaginal estrogen. She engages in some physical activity, such as walking up and down stairs and around her yard, but not consistently.  She experienced a recent episode of illness initially thought to be allergies, which  progressed to a cold with coughing. This has mostly resolved, although a slight cough persists. She did not seek medical attention for this episode.  Her weight has remained stable with minor fluctuations. She mentions a previous medication, Kenalog , which she is no longer taking. She uses Buspar  on an as-needed basis, particularly during stressful times such as the holiday season.  No new lumps, nipple changes, or lymph node involvement.    COVID 19 VACCINATION STATUS: Pfizer x3, and Moderna booster 11/2020   HISTORY OF CURRENT ILLNESS:    From the original intake note:   "Kayla Randolph" had routine screening mammography on 03/02/2019 showing a possible abnormality in the left breast. She underwent left diagnostic mammography with tomography and left breast ultrasonography at [facility] on 03/09/2019 showing: breast density category C; suspicious 1.1 cm left breast mass at 3:30 with associated punctate calcifications; no left axillary adenopathy.  Accordingly on 03/13/2019 she proceeded to biopsy of the left breast area in question. The pathology from this procedure ([id number]) showed: invasive ductal carcinoma, grade 2. Prognostic indicators significant for: estrogen receptor, 100% positive and progesterone receptor, 100% positive, both with strong staining intensity. Proliferation marker Ki67 at 30%. HER2 negative by immunohistochemistry (1+).  The patient's subsequent history is as detailed below.   PAST MEDICAL HISTORY: Past Medical History:  Diagnosis Date   Allergy    Anxiety    Atypical mole 11/23/2002   moderate-right lower back(WS)   Basal cell carcinoma 02/06/2019   nod-left nare (MOHS)   Breast cancer (HCC)    Cataract    Depression    History of colon polyps    Hyperlipidemia    Hypothyroidism    Interstitial cystitis    History of   Osteopenia  Personal history of radiation therapy    S/P lumpectomy, left breast 04/03/2019   Skin cancer    removed from left  nostril   Vitamin D  deficiency     PAST SURGICAL HISTORY: Past Surgical History:  Procedure Laterality Date   BREAST BIOPSY Left 03/13/2019   BREAST LUMPECTOMY Left 04/03/2019   BREAST LUMPECTOMY WITH RADIOACTIVE SEED AND SENTINEL LYMPH NODE BIOPSY Left 04/03/2019   Procedure: LEFT BREAST LUMPECTOMY WITH RADIOACTIVE SEED AND SENTINEL LYMPH NODE MAPPING;  Surgeon: Sim Dryer, MD;  Location: [facility];  Service: General;  Laterality: Left;   CESAREAN SECTION     EYE SURGERY Right    cataract   EYE SURGERY Left    cataract   TONSILLECTOMY      FAMILY HISTORY: Family History  Problem Relation Age of Onset   Heart attack Father    Congestive Heart Failure Father    Osteoporosis Maternal Aunt    Uterine cancer Maternal Aunt    Cancer Mother        possible lung - spread    Early death Mother    Stroke Maternal Grandmother    Breast cancer Paternal Grandmother    Stroke Paternal Grandfather   Patient's father was 27 years old when he died from congestive heart failure. Patient's mother died from cancer, type unknown to the patient, at age 5. The patient denies a family hx of ovarian cancer. She reports a maternal aunt was diagnosed with uterine cancer (age unsure), and her paternal grandmother with breast cancer at a very young age (exact age unsure).  The patient has no siblings.   GYNECOLOGIC HISTORY:  No LMP recorded. Patient is postmenopausal. Menarche: 41.78 years old Age at first live birth: 78 years old GX P 2 LMP age [age] Contraceptive: yes, used pills on and off from age [age] without issue. HRT: yes, for about 5 years, until age 63 or 69.  Hysterectomy? no BSO? no   SOCIAL HISTORY: (updated 03/2019)  Kayla Randolph is currently retired from working as a Runner, broadcasting/film/video (fourth grade and substitute).  Husband Ed is retired Solicitor in the [employer].. She lives at home with Ed and 2 cats. Daughter Mariah Shines, age 53, lives in [place] as a homemaker. Son  Kellerton, age 10, lives in [place], [place] as a IT trainer.  The patient has 4 grandchildren one of them attending [employer].  She attends [employer] in [place]    ADVANCED DIRECTIVES: In the absence of any documentation to the contrary, the patient's spouse is their HCPOA.   HEALTH MAINTENANCE: Social History   Tobacco Use   Smoking status: Never   Smokeless tobacco: Never  Vaping Use   Vaping status: Never Used  Substance Use Topics   Alcohol use: Yes    Alcohol/week: 7.0 standard drinks of alcohol    Types: 7 Standard drinks or equivalent per week    Comment: wine nightly   Drug use: No     Colonoscopy: 09/2011  PAP: 2019  Bone density: 06/2018, -2.0   Allergies  Allergen Reactions   Macrodantin    Sulfa Antibiotics    Wellbutrin [Bupropion Hcl]     Current Outpatient Medications  Medication Sig Dispense Refill   Black Pepper-Turmeric (TURMERIC COMPLEX/BLACK PEPPER PO) Take by mouth.     busPIRone  (BUSPAR ) 5 MG tablet Take 1 tablet (5 mg total) by mouth 2 (two) times daily. For anxiety/panic 180 tablet 0   Cholecalciferol (VITAMIN D3) 1000 UNITS CAPS Take 2  capsules by mouth daily.     cycloSPORINE  (RESTASIS ) 0.05 % ophthalmic emulsion Place 1 drop into both eyes 2 (two) times daily.     denosumab  (PROLIA ) 60 MG/ML SOSY injection Inject 60 mg into the skin every 6 (six) months.     desloratadine  (CLARINEX ) 5 MG tablet Take 1 tablet (5 mg total) by mouth daily as needed (allergies/ itching). 90 tablet 3   escitalopram  (LEXAPRO ) 10 MG tablet TAKE ONE TABLET ONCE DAILY 90 tablet 1   estradiol  (ESTRACE ) 0.1 MG/GM vaginal cream INSERT VAGINALLY AS DIRECTED AT BEDTIME 127.5 g 0   fluticasone  (FLONASE ) 50 MCG/ACT nasal spray 1 SPRAY IN EACH NOSTRIL EVERY 12 HOURS 48 g 1   levothyroxine  (SYNTHROID ) 25 MCG tablet TAKE 1 TABLET DAILY BEFORE BREAKFAST 90 tablet 3   Multiple Vitamins-Minerals (WOMENS 50+ MULTI VITAMIN/MIN PO) Take by mouth.     Omega-3 Fatty Acids (SUPER OMEGA-3  PO) Take by mouth.     Polyethylene Glycol 3350 (MIRALAX PO) Take 1 packet by mouth daily as needed.     rosuvastatin  (CRESTOR ) 5 MG tablet TAKE ONE TABLET DAILY AS DIRECTED 90 tablet 1   Specialty Vitamins Products (ONE-A-DAY BONE STRENGTH) 500-28-100 MG-MG-UNIT TABS Take 1 tablet by mouth 3 (three) times daily.     tamoxifen  (NOLVADEX ) 20 MG tablet TAKE ONE TABLET ONCE DAILY 90 tablet 4   triamcinolone  cream (KENALOG ) 0.1 % Apply 1 Application topically 2 (two) times daily as needed (itchy rash x7 days per flare up). 30 g 0   Current Facility-Administered Medications  Medication Dose Route Frequency Provider Last Rate Last Admin   denosumab  (PROLIA ) injection 60 mg  60 mg Subcutaneous Once Gottschalk, Ashly M, DO        OBJECTIVE: white woman who appears younger than stated age  Vitals:   10/13/23 1143  BP: 136/73  Pulse: 66  Resp: 17  Temp: 98.3 F (36.8 C)  SpO2: 100%       Body mass index is 18.95 kg/m.   Wt Readings from Last 3 Encounters:  10/13/23 113 lb 14.4 oz (51.7 kg)  08/25/23 112 lb 3.2 oz (50.9 kg)  06/12/23 113 lb 1.5 oz (51.3 kg)     ECOG FS:1 - Symptomatic but completely ambulatory  Physical Exam Constitutional:      Appearance: Normal appearance.  Chest:     Comments: Bilateral breast examined.  No palpable masses.  Postradiation changes noted in the left breast.  Postop seroma felt in the left axilla. No concern for recurrence or regional adenopathy Musculoskeletal:     Cervical back: Normal range of motion and neck supple. No rigidity.  Lymphadenopathy:     Cervical: No cervical adenopathy.  Neurological:     Mental Status: She is alert.       LAB RESULTS:  CMP     Component Value Date/Time   NA 143 08/25/2023 1214   K 4.3 08/25/2023 1214   CL 105 08/25/2023 1214   CO2 24 08/25/2023 1214   GLUCOSE 80 08/25/2023 1214   GLUCOSE 72 10/07/2022 1124   BUN 17 08/25/2023 1214   CREATININE 0.71 08/25/2023 1214   CREATININE 0.65 10/07/2022  1124   CREATININE 0.69 07/24/2013 0836   CALCIUM  9.6 08/25/2023 1214   PROT 6.2 08/25/2023 1214   ALBUMIN 4.1 08/25/2023 1214   AST 21 08/25/2023 1214   AST 20 10/07/2022 1124   ALT 13 08/25/2023 1214   ALT 12 10/07/2022 1124   ALKPHOS 34 (L) 08/25/2023 1214  BILITOT 0.2 08/25/2023 1214   BILITOT 0.4 10/07/2022 1124   GFRNONAA >60 10/07/2022 1124   GFRNONAA 89 07/24/2013 0836   GFRAA 92 05/23/2020 0930   GFRAA >60 06/21/2019 1204   GFRAA >89 07/24/2013 0836    No results found for: "TOTALPROTELP", "ALBUMINELP", "A1GS", "A2GS", "BETS", "BETA2SER", "GAMS", "MSPIKE", "SPEI"  No results found for: "KPAFRELGTCHN", "LAMBDASER", "KAPLAMBRATIO"  Lab Results  Component Value Date   WBC 6.2 10/07/2022   NEUTROABS 4.4 10/07/2022   HGB 14.8 10/07/2022   HCT 45.7 10/07/2022   MCV 94.6 10/07/2022   PLT 166 10/07/2022   No results found for: "LABCA2"  No components found for: "ZOXWRU045"  No results for input(s): "INR" in the last 168 hours.  No results found for: "LABCA2"  No results found for: "WUJ811"  No results found for: "CAN125"  No results found for: "CAN153"  No results found for: "CA2729"  No components found for: "HGQUANT"  No results found for: "CEA1", "CEA" / No results found for: "CEA1", "CEA"   No results found for: "AFPTUMOR"  No results found for: "CHROMOGRNA"  No results found for: "HGBA", "HGBA2QUANT", "HGBFQUANT", "HGBSQUAN" (Hemoglobinopathy evaluation)   No results found for: "LDH"  No results found for: "IRON", "TIBC", "IRONPCTSAT" (Iron and TIBC)  Lab Results  Component Value Date   FERRITIN 23 09/19/2012    Urinalysis    Component Value Date/Time   APPEARANCEUR Clear 08/27/2019 0947   GLUCOSEU Negative 08/27/2019 0947   BILIRUBINUR Negative 08/27/2019 0947   PROTEINUR Negative 08/27/2019 0947   UROBILINOGEN negative 12/02/2014 1128   NITRITE Negative 08/27/2019 0947   LEUKOCYTESUR Negative 08/27/2019 0947    STUDIES: US   ARTERIAL ABI (SCREENING LOWER EXTREMITY) Result Date: 09/17/2023 CLINICAL DATA:  Abnormal Healthcare screening ABI. History of breast cancer, basal cell carcinoma, hyperlipidemia. EXAM: NONINVASIVE PHYSIOLOGIC VASCULAR STUDY OF BILATERAL LOWER EXTREMITIES TECHNIQUE: Evaluation of both lower extremities were performed at rest, including calculation of ankle-brachial indices with single level pressure measurements and doppler recording. COMPARISON:  None available. FINDINGS: Right ABI:  1.11 Left ABI:  1.09 Right Lower Extremity:  Normal arterial waveforms at the ankle. Left Lower Extremity:  Normal arterial waveforms at the ankle. 1.0-1.4 Normal IMPRESSION: Normal ABI examination. Electronically Signed   By: Elester Grim M.D.   On: 09/17/2023 06:56      ELIGIBLE FOR AVAILABLE RESEARCH PROTOCOL: Speaker phone study  ASSESSMENT: 78 y.o. [place], [place] woman status post left breast upper inner quadrant biopsy 03/13/2019 for a clinical T1c N0, stage IA invasive ductal carcinoma, grade 2, estrogen and progesterone receptor positive, HER-2 not amplified, with an MIB-1 of 30%  (1) status post left lumpectomy 04/03/2019 for a pT1c pN0, stage IB invasive ductal carcinoma, grade 2, with negative margins  (a) a total of 2 left axillary lymph nodes were clear  (2) Oncotype score of 21 predicts a risk of recurrence outside the breast within the next 9 years of 7% if the patient's only systemic therapy is antiestrogens for 5 years.  It also predicts no benefit from chemotherapy.  (3) adjuvant radiation 05/14/2019 through 06/11/2019  (4) started tamoxifen  07/02/2019  (a) bone density 06/22/2019 shows a T score of -2.0  (b) on Estrace  vaginal suppositories   PLAN:  Assessment and Plan Assessment & Plan Breast cancer Early stage breast cancer on tamoxifen  since February 2021. Normal mammogram in January.   Discussed exercise importance for bone density and health. - Refill tamoxifen  prescription and send to  [facility]. - Continue tamoxifen  until  spring 2026. - Encourage 30 minutes of brisk walking daily.   Return to clinic in 1 year  Total encounter time 20 minutes.Murleen Arms, MD   10/13/2023 11:52 AM Medical Oncology and Hematology [facility] [address] [place], [place] [address] Tel. [phone]    Fax. [phone]   *Total Encounter Time as defined by the Centers for Medicare and Medicaid Services includes, in addition to the face-to-face time of a patient visit (documented in the note above) non-face-to-face time: obtaining and reviewing outside history, ordering and reviewing medications, tests or procedures, care coordination (communications with other health care professionals or caregivers) and documentation in the medical record.

## 2023-10-25 ENCOUNTER — Ambulatory Visit

## 2023-10-31 ENCOUNTER — Telehealth: Payer: Self-pay | Admitting: Family Medicine

## 2023-10-31 NOTE — Telephone Encounter (Unsigned)
 Copied from CRM [id number]. Topic: General - Other >> Oct 26, 2023  2:46 PM Loreda Rodriguez T wrote: Reason for CRM: Bearl Botts from [facility] called to schedule an appt for Prolia  delivery. Called [facility] and while being transferred to [facility] the call disconnected >> Oct 31, 2023  1:31 PM Essie A wrote: Patient is calling to see if her Prolia  has been delivered to the office.  Please return her call at [phone].

## 2023-11-01 DIAGNOSIS — H04123 Dry eye syndrome of bilateral lacrimal glands: Secondary | ICD-10-CM | POA: Diagnosis not present

## 2023-11-01 DIAGNOSIS — Z961 Presence of intraocular lens: Secondary | ICD-10-CM | POA: Diagnosis not present

## 2023-11-01 DIAGNOSIS — H43813 Vitreous degeneration, bilateral: Secondary | ICD-10-CM | POA: Diagnosis not present

## 2023-11-01 NOTE — Telephone Encounter (Signed)
 Called the pharmacy ([facility]) - delevery date is set for Thursday 11/03/2023. Called patient to notify and schedule her appt - no answer lmtcb

## 2023-11-02 ENCOUNTER — Ambulatory Visit (INDEPENDENT_AMBULATORY_CARE_PROVIDER_SITE_OTHER): Admitting: *Deleted

## 2023-11-02 DIAGNOSIS — M8580 Other specified disorders of bone density and structure, unspecified site: Secondary | ICD-10-CM

## 2023-11-02 DIAGNOSIS — M810 Age-related osteoporosis without current pathological fracture: Secondary | ICD-10-CM

## 2023-11-02 NOTE — Progress Notes (Signed)
 Prolia  injection given right arm subcutaneous. Patient tolerated well

## 2023-11-03 NOTE — Telephone Encounter (Signed)
 Patient decided to come for office supply - medication was canceled through pharmacy

## 2023-11-08 DIAGNOSIS — Z1283 Encounter for screening for malignant neoplasm of skin: Secondary | ICD-10-CM | POA: Diagnosis not present

## 2023-11-08 DIAGNOSIS — D485 Neoplasm of uncertain behavior of skin: Secondary | ICD-10-CM | POA: Diagnosis not present

## 2023-11-08 DIAGNOSIS — L821 Other seborrheic keratosis: Secondary | ICD-10-CM | POA: Diagnosis not present

## 2023-11-08 DIAGNOSIS — D225 Melanocytic nevi of trunk: Secondary | ICD-10-CM | POA: Diagnosis not present

## 2023-11-08 DIAGNOSIS — L814 Other melanin hyperpigmentation: Secondary | ICD-10-CM | POA: Diagnosis not present

## 2023-12-08 ENCOUNTER — Ambulatory Visit: Payer: Medicare Other

## 2023-12-08 VITALS — BP 136/73 | HR 66 | Ht 65.0 in | Wt 113.0 lb

## 2023-12-08 DIAGNOSIS — Z0000 Encounter for general adult medical examination without abnormal findings: Secondary | ICD-10-CM

## 2023-12-08 NOTE — Progress Notes (Signed)
 Subjective:   Kayla Randolph is a 78 y.o. who presents for a Medicare Wellness preventive visit.  As a reminder, Annual Wellness Visits don't include a physical exam, and some assessments may be limited, especially if this visit is performed virtually. We may recommend an in-person follow-up visit with your provider if needed.  Visit Complete: Virtual I connected with  Kayla Randolph on 12/08/23 by a audio enabled telemedicine application and verified that I am speaking with the correct person using two identifiers.  Patient Location: Home  Provider Location: Home Office  I discussed the limitations of evaluation and management by telemedicine. The patient expressed understanding and agreed to proceed.  Vital Signs: Because this visit was a virtual/telehealth visit, some criteria may be missing or patient reported. Any vitals not documented were not able to be obtained and vitals that have been documented are patient reported.  VideoDeclined- This patient declined Librarian, academic. Therefore the visit was completed with audio only.  Persons Participating in Visit: Patient.  AWV Questionnaire: No: Patient Medicare AWV questionnaire was not completed prior to this visit.  Cardiac Risk Factors include: advanced age (>44men, >73 women);dyslipidemia;smoking/ tobacco exposure     Objective:    Today's Vitals   12/08/23 1126  BP: 136/73  Pulse: 66  Weight: 113 lb (51.3 kg)  Height: 5' 5 (1.651 m)   Body mass index is 18.8 kg/m.     12/08/2023   11:32 AM 06/12/2023    4:34 PM 12/07/2022    1:51 PM 12/04/2021    9:54 AM 12/03/2020    9:21 AM 12/03/2019   10:37 AM 10/03/2019    4:22 PM  Advanced Directives  Does Patient Have a Medical Advance Directive? Yes Yes Yes Yes Yes Yes Yes  Type of Estate agent of [place];Living will Healthcare Power of [place];Living will Healthcare Power of [place];Living will Healthcare Power of  [place];Living will Healthcare Power of [place];Living will Healthcare Power of [place];Living will   Does patient want to make changes to medical advance directive?      No - Patient declined Yes (MAU/Ambulatory/Procedural Areas - Information given)  Copy of Healthcare Power of Attorney in Chart? No - copy requested  No - copy requested No - copy requested No - copy requested No - copy requested     Current Medications (verified) Outpatient Encounter Medications as of 12/08/2023  Medication Sig   Black Pepper-Turmeric (TURMERIC COMPLEX/BLACK PEPPER PO) Take by mouth.   busPIRone  (BUSPAR ) 5 MG tablet Take 1 tablet (5 mg total) by mouth 2 (two) times daily. For anxiety/panic   Cholecalciferol (VITAMIN D3) 1000 UNITS CAPS Take 2 capsules by mouth daily.   cycloSPORINE  (RESTASIS ) 0.05 % ophthalmic emulsion Place 1 drop into both eyes 2 (two) times daily.   denosumab  (PROLIA ) 60 MG/ML SOSY injection Inject 60 mg into the skin every 6 (six) months.   desloratadine  (CLARINEX ) 5 MG tablet Take 1 tablet (5 mg total) by mouth daily as needed (allergies/ itching).   escitalopram  (LEXAPRO ) 10 MG tablet TAKE ONE TABLET ONCE DAILY   estradiol  (ESTRACE ) 0.1 MG/GM vaginal cream INSERT VAGINALLY AS DIRECTED AT BEDTIME   fluticasone  (FLONASE ) 50 MCG/ACT nasal spray 1 SPRAY IN EACH NOSTRIL EVERY 12 HOURS   levothyroxine  (SYNTHROID ) 25 MCG tablet TAKE 1 TABLET DAILY BEFORE BREAKFAST   Multiple Vitamins-Minerals (WOMENS 50+ MULTI VITAMIN/MIN PO) Take by mouth.   Omega-3 Fatty Acids (SUPER OMEGA-3 PO) Take by mouth.   Polyethylene Glycol 3350 (  MIRALAX PO) Take 1 packet by mouth daily as needed.   rosuvastatin  (CRESTOR ) 5 MG tablet TAKE ONE TABLET DAILY AS DIRECTED   Specialty Vitamins Products (ONE-A-DAY BONE STRENGTH) 500-28-100 MG-MG-UNIT TABS Take 1 tablet by mouth 3 (three) times daily.   tamoxifen  (NOLVADEX ) 20 MG tablet Take 1 tablet (20 mg total) by mouth daily.   No facility-administered encounter  medications on file as of 12/08/2023.    Allergies (verified) Macrodantin, Sulfa antibiotics, and Wellbutrin [bupropion hcl]   History: Past Medical History:  Diagnosis Date   Allergy    Anxiety    Arthritis ?   Atypical mole 11/23/2002   moderate-right lower back(WS)   Basal cell carcinoma 02/06/2019   nod-left nare (MOHS)   Breast cancer (HCC)    Cataract    Depression    History of colon polyps    Hyperlipidemia    Hypothyroidism    Interstitial cystitis    History of   Osteopenia    Osteoporosis age 6   Personal history of radiation therapy    S/P lumpectomy, left breast 04/03/2019   Skin cancer    removed from left nostril   Vitamin D  deficiency    Past Surgical History:  Procedure Laterality Date   BREAST BIOPSY Left 03/13/2019   BREAST LUMPECTOMY Left 04/03/2019   BREAST LUMPECTOMY WITH RADIOACTIVE SEED AND SENTINEL LYMPH NODE BIOPSY Left 04/03/2019   Procedure: LEFT BREAST LUMPECTOMY WITH RADIOACTIVE SEED AND SENTINEL LYMPH NODE MAPPING;  Surgeon: Vanderbilt Ned, MD;  Location: [facility];  Service: General;  Laterality: Left;   CESAREAN SECTION     EYE SURGERY Right    cataract   EYE SURGERY Left    cataract   TONSILLECTOMY     Family History  Problem Relation Age of Onset   Heart attack Father    Congestive Heart Failure Father    Depression Father    Heart disease Father    Osteoporosis Maternal Aunt    Uterine cancer Maternal Aunt    Cancer Mother        possible lung - spread    Early death Mother    Arthritis Mother    Stroke Maternal Grandmother    Stroke Maternal Grandfather    Breast cancer Paternal Grandmother    Arthritis Paternal Grandmother    Stroke Paternal Grandmother    Stroke Paternal Grandfather    Social History   Socioeconomic History   Marital status: Married    Spouse name: Ed   Number of children: 2   Years of education: 16   Highest education level: Bachelor's degree (e.g., BA, AB, BS)   Occupational History   Occupation: Retired    Comment: Software engineer  Tobacco Use   Smoking status: Never   Smokeless tobacco: Never  Vaping Use   Vaping status: Never Used  Substance and Sexual Activity   Alcohol use: Yes    Alcohol/week: 7.0 standard drinks of alcohol    Types: 7 Standard drinks or equivalent per week    Comment: wine nightly   Drug use: No   Sexual activity: Yes    Birth control/protection: Post-menopausal  Other Topics Concern   Not on file  Social History Narrative   Lives with Ed   2 cats    2 children = neither local    Social Drivers of Health   Financial Resource Strain: Low Risk  (12/08/2023)   Overall Financial Resource Strain (CARDIA)    Difficulty of Paying  Living Expenses: Not hard at all  Food Insecurity: No Food Insecurity (12/08/2023)   Hunger Vital Sign    Worried About Running Out of Food in the Last Year: Never true    Ran Out of Food in the Last Year: Never true  Transportation Needs: No Transportation Needs (12/08/2023)   PRAPARE - Administrator, Civil Service (Medical): No    Lack of Transportation (Non-Medical): No  Physical Activity: Insufficiently Active (12/08/2023)   Exercise Vital Sign    Days of Exercise per Week: 3 days    Minutes of Exercise per Session: 20 min  Stress: No Stress Concern Present (12/08/2023)   [employer] of Occupational Health - Occupational Stress Questionnaire    Feeling of Stress: Not at all  Social Connections: Moderately Isolated (12/08/2023)   Social Connection and Isolation Panel    Frequency of Communication with Friends and Family: Once a week    Frequency of Social Gatherings with Friends and Family: Once a week    Attends Religious Services: More than 4 times per year    Active Member of [employer] or Organizations: Yes    Attends Banker Meetings: 1 to 4 times per year    Marital Status: Widowed    Tobacco Counseling Counseling given: Yes    Clinical  Intake:  Pre-visit preparation completed: Yes  Pain : No/denies pain     BMI - recorded: 18.8 Nutritional Status: BMI <19  Underweight Nutritional Risks: None Diabetes: No  No results found for: HGBA1C   How often do you need to have someone help you when you read instructions, pamphlets, or other written materials from your doctor or pharmacy?: 1 - Never  Interpreter Needed?: No  Information entered by :: alia t/cma   Activities of Daily Living     12/08/2023   11:31 AM  In your present state of health, do you have any difficulty performing the following activities:  Hearing? 0  Vision? 0  Difficulty concentrating or making decisions? 0  Walking or climbing stairs? 0  Dressing or bathing? 0  Doing errands, shopping? 0  Preparing Food and eating ? N  Using the Toilet? N  In the past six months, have you accidently leaked urine? Y  Do you have problems with loss of bowel control? N  Managing your Medications? N  Managing your Finances? N  Housekeeping or managing your Housekeeping? N    Patient Care Team: Jolinda Norene HERO, DO as PCP - General (Family Medicine) Rosalie Kitchens, MD as Consulting Physician (Gastroenterology) Livingston Rigg, MD (Inactive) as Consulting Physician (Dermatology) Rigg Stephane BROCKS, RN (Inactive) as Oncology Nurse Navigator Tyree Nanetta SAILOR, RN as Oncology Nurse Navigator Magrinat, Sandria BROCKS, MD (Inactive) as Consulting Physician (Oncology) Roz Anes, MD as Consulting Physician (Ophthalmology) Dewey Rush, MD as Consulting Physician (Radiation Oncology) Vanderbilt Ned, MD as Consulting Physician (General Surgery)  I have updated your Care Teams any recent Medical Services you may have received from other providers in the past year.     Assessment:   This is a routine wellness examination for [place].  Hearing/Vision screen Hearing Screening - Comments:: Pt denies hearing dif Vision Screening - Comments:: Pt denies vision dif--pt  wear glasses/pt goes to Dr Octavia, last ov w/2025   Goals Addressed   None    Depression Screen     12/08/2023   11:37 AM 08/25/2023   11:47 AM 02/25/2023    1:59 PM 12/07/2022    1:50 PM 08/02/2022  1:10 PM 02/19/2022   11:06 AM 12/04/2021    9:53 AM  PHQ 2/9 Scores  PHQ - 2 Score 0 0 0 0 0 0 0  PHQ- 9 Score  1 0  0      Fall Risk     12/08/2023   11:29 AM 08/25/2023   11:48 AM 12/07/2022    1:49 PM 08/02/2022    1:10 PM 02/19/2022   11:06 AM  Fall Risk   Falls in the past year? 0 0 0 0 0  Number falls in past yr: 0 0 0 0   Injury with Fall? 0 0 0 0   Risk for fall due to : No Fall Risks No Fall Risks No Fall Risks No Fall Risks   Follow up Falls evaluation completed Falls evaluation completed Falls prevention discussed Education provided     MEDICARE RISK AT HOME:  Medicare Risk at Home Any stairs in or around the home?: Yes If so, are there any without handrails?: Yes Home free of loose throw rugs in walkways, pet beds, electrical cords, etc?: Yes Adequate lighting in your home to reduce risk of falls?: Yes Life alert?: No Use of a cane, walker or w/c?: No Grab bars in the bathroom?: Yes Shower chair or bench in shower?: Yes Elevated toilet seat or a handicapped toilet?: Yes  TIMED UP AND GO:  Was the test performed?  No  Cognitive Function: 6CIT completed    11/15/2017   11:31 AM 10/07/2016   11:03 AM  MMSE - Mini Mental State Exam  Orientation to time 5 5   Orientation to Place 5 5   Registration 3 3   Attention/ Calculation 5 5   Recall 3 2   Language- name 2 objects 2 2   Language- repeat 1 1  Language- follow 3 step command 3 3   Language- read & follow direction 1 1   Write a sentence 1 1   Copy design 1 1   Total score 30 29      Data saved with a previous flowsheet row definition        12/08/2023   11:33 AM 12/07/2022    1:52 PM 12/04/2021    9:56 AM 12/03/2019   10:42 AM 12/01/2018    9:13 AM  6CIT Screen  What Year? 0 points 0 points 0  points 0 points 0 points  What month? 0 points 0 points 0 points 0 points 0 points  What time? 0 points 0 points 0 points 0 points 0 points  Count back from 20 0 points 0 points 0 points 0 points 0 points  Months in reverse 0 points 0 points 2 points 0 points 0 points  Repeat phrase 0 points 0 points 4 points 0 points 0 points  Total Score 0 points 0 points 6 points 0 points 0 points    Immunizations Immunization History  Administered Date(s) Administered   Fluad Quad(high Dose 65+) 03/01/2019, 03/04/2020, 02/23/2021, 03/15/2022   Fluad Trivalent(High Dose 65+) 02/21/2023   Influenza Whole 02/14/2010   Influenza, High Dose Seasonal PF 03/18/2016, 02/17/2017, 03/07/2018   Influenza,inj,Quad PF,6+ Mos 04/03/2013, 03/14/2014, 03/28/2015   Moderna Covid-19 Fall Seasonal Vaccine 13yrs & older 07/21/2022, 03/15/2023   Moderna Sars-Covid-2 Vaccination 11/25/2020   PFIZER(Purple Top)SARS-COV-2 Vaccination 06/05/2019, 06/28/2019, 03/19/2020   PNEUMOCOCCAL CONJUGATE-20 09/18/2021   Pneumococcal Conjugate-13 05/15/2013   Pneumococcal Polysaccharide-23 08/15/2009   Respiratory Syncytial Virus Vaccine,Recomb Aduvanted(Arexvy) 02/21/2023   Td 06/09/2018   Tdap 05/18/2007  Zoster Recombinant(Shingrix ) 10/07/2016, 03/24/2017   Zoster, Live 06/09/2006    Screening Tests Health Maintenance  Topic Date Due   COVID-19 Vaccine (7 - Pfizer risk 2024-25 season) 09/13/2023   INFLUENZA VACCINE  12/16/2023   MAMMOGRAM  06/06/2024   DEXA SCAN  08/02/2024   Colonoscopy  11/28/2024   Medicare Annual Wellness (AWV)  12/07/2024   DTaP/Tdap/Td (3 - Td or Tdap) 06/09/2028   Pneumococcal Vaccine: 50+ Years  Completed   Hepatitis C Screening  Completed   Zoster Vaccines- Shingrix   Completed   Hepatitis B Vaccines  Aged Out   HPV VACCINES  Aged Out   Meningococcal B Vaccine  Aged Out    Health Maintenance  Health Maintenance Due  Topic Date Due   COVID-19 Vaccine (7 - Pfizer risk 2024-25 season)  09/13/2023   Health Maintenance Items Addressed: See Nurse Notes at the end of this note  Additional Screening:  Vision Screening: Recommended annual ophthalmology exams for early detection of glaucoma and other disorders of the eye. Would you like a referral to an eye doctor? No    Dental Screening: Recommended annual dental exams for proper oral hygiene  Community Resource Referral / Chronic Care Management: CRR required this visit?  No   CCM required this visit?  No   Plan:    I have personally reviewed and noted the following in the patient's chart:   Medical and social history Use of alcohol, tobacco or illicit drugs  Current medications and supplements including opioid prescriptions. Patient is not currently taking opioid prescriptions. Functional ability and status Nutritional status Physical activity Advanced directives List of other physicians Hospitalizations, surgeries, and ER visits in previous 12 months Vitals Screenings to include cognitive, depression, and falls Referrals and appointments  In addition, I have reviewed and discussed with patient certain preventive protocols, quality metrics, and best practice recommendations. A written personalized care plan for preventive services as well as general preventive health recommendations were provided to patient.   Ozie Ned, CMA   12/08/2023   After Visit Summary: (MyChart) Due to this being a telephonic visit, the after visit summary with patients personalized plan was offered to patient via MyChart   Notes: Nothing significant to report at this time.

## 2023-12-08 NOTE — Patient Instructions (Signed)
 Ms. Cubbage , Thank you for taking time out of your busy schedule to complete your Annual Wellness Visit with me. I enjoyed our conversation and look forward to speaking with you again next year. I, as well as your care team,  appreciate your ongoing commitment to your health goals. Please review the following plan we discussed and let me know if I can assist you in the future. Your Game plan/ To Do List    Follow up Visits: Next Medicare AWV with our clinical staff: 12/10/24 at 10:00a.m.   Next Office Visit with your provider: 05/02/24 at 1:00p.m.  Clinician Recommendations:  Aim for 30 minutes of exercise or brisk walking, 6-8 glasses of water, and 5 servings of fruits and vegetables each day.       This is a list of the screening recommended for you and due dates:  Health Maintenance  Topic Date Due   COVID-19 Vaccine (7 - Pfizer risk 2024-25 season) 09/13/2023   Medicare Annual Wellness Visit  12/07/2023   Flu Shot  12/16/2023   Mammogram  06/06/2024   DEXA scan (bone density measurement)  08/02/2024   Colon Cancer Screening  11/28/2024   DTaP/Tdap/Td vaccine (3 - Td or Tdap) 06/09/2028   Pneumococcal Vaccine for age over 51  Completed   Hepatitis C Screening  Completed   Zoster (Shingles) Vaccine  Completed   Hepatitis B Vaccine  Aged Out   HPV Vaccine  Aged Out   Meningitis B Vaccine  Aged Out    Advanced directives: (Copy Requested) Please bring a copy of your health care power of attorney and living will to the office to be added to your chart at your convenience. You can mail to [facility] [address] [place], [place] [address] or email to [email or web address] Advance Care Planning is important because it:  [x]  Makes sure you receive the medical care that is consistent with your values, goals, and preferences  [x]  It provides guidance to your family and loved ones and reduces their decisional burden about whether or not they are making the right  decisions based on your wishes.  Follow the link provided in your after visit summary or read over the paperwork we have mailed to you to help you started getting your Advance Directives in place. If you need assistance in completing these, please reach out to us  so that we can help you!  See attachments for Preventive Care and Fall Prevention Tips.

## 2023-12-12 ENCOUNTER — Other Ambulatory Visit: Payer: Self-pay | Admitting: Family Medicine

## 2024-02-29 ENCOUNTER — Encounter: Payer: Self-pay | Admitting: Hematology and Oncology

## 2024-04-02 ENCOUNTER — Other Ambulatory Visit: Payer: Self-pay | Admitting: *Deleted

## 2024-04-05 ENCOUNTER — Telehealth: Payer: Self-pay

## 2024-04-05 DIAGNOSIS — M810 Age-related osteoporosis without current pathological fracture: Secondary | ICD-10-CM

## 2024-04-05 MED ORDER — DENOSUMAB 60 MG/ML ~~LOC~~ SOSY: 60.0000 mg | PREFILLED_SYRINGE | SUBCUTANEOUS | Status: DC

## 2024-04-05 NOTE — Telephone Encounter (Signed)
 Prolia  sent for benefit verification

## 2024-04-16 ENCOUNTER — Other Ambulatory Visit: Payer: Self-pay | Admitting: Family Medicine

## 2024-04-17 ENCOUNTER — Other Ambulatory Visit (HOSPITAL_COMMUNITY): Payer: Self-pay

## 2024-04-17 ENCOUNTER — Telehealth: Payer: Self-pay

## 2024-04-17 NOTE — Telephone Encounter (Signed)
 Pt ready for scheduling for PROLIA  on or after : 05/03/24  Option# 1: Buy/Bill (Office supplied medication)  Out-of-pocket cost due at time of clinic visit: $352  Number of injection/visits approved: 2  Primary: UHC-MEDICARE Prolia  co-insurance: 20% Admin fee co-insurance: $20  Secondary: --- Prolia  co-insurance:  Admin fee co-insurance:   Medical Benefit Details: Date Benefits were checked: 04/17/24 Deductible: NO/ Coinsurance: 20%/ Admin Fee: $20  Prior Auth: APPROVED PA# [id number] Expiration Date: 10/05/23-10/04/24  # of doses approved: 2 ----------------------------------------------------------------------- Option# 2- Med Obtained from pharmacy: Prolia  is no longer preferred for pharmacy benefit. [facility] is now preferred. PRICING IS FOR [facility]  Pharmacy benefit: Copay $300 (Paid to pharmacy) Admin Fee: $20 (Pay at clinic)  Prior Auth: N/A PA# Expiration Date:   # of doses approved:   If patient wants fill through the pharmacy benefit please send prescription to: [facility], and include estimated need by date in rx notes. Pharmacy will ship medication directly to the office.  Patient NOT eligible for Prolia  Copay Card. Copay Card can make patient's cost as little as $25. Link to apply: [email or web address]://www.[email or web address].[email or web address]/copay  ** This summary of benefits is an estimation of the patient's out-of-pocket cost. Exact cost may very based on individual plan coverage.

## 2024-04-17 NOTE — Telephone Encounter (Signed)
 Kayla Randolph

## 2024-04-17 NOTE — Telephone Encounter (Signed)
 Prolia  VOB initiated via [email or web address]  Next Prolia  inj DUE: 05/03/24

## 2024-04-23 ENCOUNTER — Other Ambulatory Visit: Payer: Self-pay

## 2024-04-23 ENCOUNTER — Other Ambulatory Visit: Payer: Self-pay | Admitting: Family Medicine

## 2024-04-23 DIAGNOSIS — M810 Age-related osteoporosis without current pathological fracture: Secondary | ICD-10-CM

## 2024-04-23 MED ORDER — JUBBONTI 60 MG/ML ~~LOC~~ SOSY: 60.0000 mg | PREFILLED_SYRINGE | SUBCUTANEOUS | 0 refills | Status: DC

## 2024-04-27 ENCOUNTER — Other Ambulatory Visit: Payer: Self-pay

## 2024-04-27 DIAGNOSIS — M810 Age-related osteoporosis without current pathological fracture: Secondary | ICD-10-CM

## 2024-04-27 MED ORDER — JUBBONTI 60 MG/ML ~~LOC~~ SOSY
60.0000 mg | PREFILLED_SYRINGE | SUBCUTANEOUS | 0 refills | Status: AC
  Filled 2024-04-30: qty 1, 180d supply, fill #0

## 2024-04-30 ENCOUNTER — Other Ambulatory Visit: Payer: Self-pay

## 2024-04-30 ENCOUNTER — Other Ambulatory Visit (HOSPITAL_COMMUNITY): Payer: Self-pay

## 2024-05-01 ENCOUNTER — Telehealth: Payer: Self-pay | Admitting: Hematology and Oncology

## 2024-05-01 NOTE — Telephone Encounter (Signed)
 I spoke w patient regarding 10/15/24 appt being rescheduled to 10/16/24. Patient is aware of new appt date and time.

## 2024-05-02 ENCOUNTER — Ambulatory Visit: Payer: Self-pay | Admitting: Family Medicine

## 2024-05-02 ENCOUNTER — Other Ambulatory Visit (HOSPITAL_COMMUNITY): Payer: Self-pay

## 2024-05-02 ENCOUNTER — Other Ambulatory Visit: Payer: Self-pay

## 2024-05-02 ENCOUNTER — Encounter (HOSPITAL_COMMUNITY): Payer: Self-pay

## 2024-05-02 ENCOUNTER — Encounter: Payer: Self-pay | Admitting: Family Medicine

## 2024-05-02 ENCOUNTER — Telehealth: Payer: Self-pay

## 2024-05-02 VITALS — BP 135/85 | HR 62 | Temp 98.4°F | Ht 65.0 in | Wt 114.0 lb

## 2024-05-02 DIAGNOSIS — E782 Mixed hyperlipidemia: Secondary | ICD-10-CM

## 2024-05-02 DIAGNOSIS — K582 Mixed irritable bowel syndrome: Secondary | ICD-10-CM | POA: Diagnosis not present

## 2024-05-02 DIAGNOSIS — J3089 Other allergic rhinitis: Secondary | ICD-10-CM | POA: Diagnosis not present

## 2024-05-02 DIAGNOSIS — Z0000 Encounter for general adult medical examination without abnormal findings: Secondary | ICD-10-CM | POA: Diagnosis not present

## 2024-05-02 DIAGNOSIS — F411 Generalized anxiety disorder: Secondary | ICD-10-CM

## 2024-05-02 DIAGNOSIS — H01004 Unspecified blepharitis left upper eyelid: Secondary | ICD-10-CM

## 2024-05-02 DIAGNOSIS — E034 Atrophy of thyroid (acquired): Secondary | ICD-10-CM | POA: Diagnosis not present

## 2024-05-02 DIAGNOSIS — M810 Age-related osteoporosis without current pathological fracture: Secondary | ICD-10-CM | POA: Diagnosis not present

## 2024-05-02 DIAGNOSIS — E559 Vitamin D deficiency, unspecified: Secondary | ICD-10-CM

## 2024-05-02 MED ORDER — ROSUVASTATIN CALCIUM 5 MG PO TABS: ORAL_TABLET | ORAL | 3 refills | Status: AC

## 2024-05-02 MED ORDER — LEVOTHYROXINE SODIUM 25 MCG PO TABS: 25.0000 ug | ORAL_TABLET | Freq: Every day | ORAL | 3 refills | Status: AC

## 2024-05-02 MED ORDER — ESCITALOPRAM OXALATE 10 MG PO TABS: 10.0000 mg | ORAL_TABLET | Freq: Every day | ORAL | 3 refills | Status: AC

## 2024-05-02 MED ORDER — BUSPIRONE HCL 5 MG PO TABS: 5.0000 mg | ORAL_TABLET | Freq: Two times a day (BID) | ORAL | 3 refills | Status: AC

## 2024-05-02 MED ORDER — DENOSUMAB-BBDZ 60 MG/ML ~~LOC~~ SOSY
60.0000 mg | PREFILLED_SYRINGE | Freq: Once | SUBCUTANEOUS | Status: AC
  Administered 2024-05-02: 14:00:00 60 mg via SUBCUTANEOUS

## 2024-05-02 MED ORDER — DESLORATADINE 5 MG PO TABS: 5.0000 mg | ORAL_TABLET | Freq: Every day | ORAL | 3 refills | Status: AC | PRN

## 2024-05-02 MED ORDER — ERYTHROMYCIN 5 MG/GM OP OINT: 1.0000 | TOPICAL_OINTMENT | Freq: Two times a day (BID) | OPHTHALMIC | 0 refills | Status: AC

## 2024-05-02 NOTE — Progress Notes (Signed)
 Spoke with Melissa from [facility] regarding patients Kayla Randolph . She inquired if the medication could be sent to their office by the patients appointment on 05/03/24. Informed her that delivery by that date was not possible, but we could send it next week depending on their office holiday schedule and patient communication.   Prescription was also sent to [facility]; I informed Eleanor that they have a paid claim and she stated that she will follow up with that pharmacy. Requested [facility]) to reactivate the prescription on file should their office prefer us  to dispense the medication.

## 2024-05-02 NOTE — Progress Notes (Signed)
 Kayla Randolph is a 78 y.o. female presents to office today for annual physical exam examination.    Patient reports that she has some arthritic changes in multiple joints.  Sometimes she takes ibuprofen  that does seem to help.  Low back also seems to respond to this.  She continues to have some alternating constipation and normal stool.  She takes magnesium or MiraLAX for constipation with some relief.  She reports rhinosinusitis.  Reports no purulence, fevers, cough or changes in breathing.  Uses Flonase  sometimes but admits that she may not be utilizing it correctly.  Compliant with her tamoxifen .  No red flag signs or symptoms and she does not really have any side effects of the medication.  Hoping to come off of that in June of next year.  She reports a cyst near the inner eyelid that is been there for a couple of weeks and seem to be draining a little bit.  Was not sure if there was something that we should do about it.  She reports no visual disturbance.  Continues to use Restasis for dry eyes.  Compliant with all prescribed medications   Occupation: retired, Marital status: married, Substance use: none Health Maintenance Due  Topic Date Due   Mammogram  06/06/2024    Immunization History  Administered Date(s) Administered   Fluad Quad(high Dose 65+) 03/01/2019, 03/04/2020, 02/23/2021, 03/15/2022   Fluad Trivalent(High Dose 65+) 02/21/2023   INFLUENZA, HIGH DOSE SEASONAL PF 03/18/2016, 02/17/2017, 03/07/2018   Influenza Whole 02/14/2010   Influenza,inj,Quad PF,6+ Mos 04/03/2013, 03/14/2014, 03/28/2015   Influenza-Unspecified 02/28/2024   Moderna Covid-19 Fall Seasonal Vaccine 67yrs & older 07/21/2022, 03/15/2023, 02/13/2024   Moderna Sars-Covid-2 Vaccination 11/25/2020   PFIZER(Purple Top)SARS-COV-2 Vaccination 06/05/2019, 06/28/2019, 03/19/2020   PNEUMOCOCCAL CONJUGATE-20 09/18/2021   Pneumococcal Conjugate-13 05/15/2013   Pneumococcal Polysaccharide-23 08/15/2009   Respiratory  Syncytial Virus Vaccine,Recomb Aduvanted(Arexvy) 02/21/2023   Td 06/09/2018   Tdap 05/18/2007   Zoster Recombinant(Shingrix ) 10/07/2016, 03/24/2017   Zoster, Live 06/09/2006   Past Medical History:  Diagnosis Date   Allergy    Anxiety    Arthritis ?   Atypical mole 11/23/2002   moderate-right lower back(WS)   Basal cell carcinoma 02/06/2019   nod-left nare (MOHS)   Breast cancer (HCC)    Cataract    Depression    History of colon polyps    Hyperlipidemia    Hypothyroidism    Interstitial cystitis    History of   Osteopenia    Osteoporosis age 43   Personal history of radiation therapy    S/P lumpectomy, left breast 04/03/2019   Skin cancer    removed from left nostril   Vitamin D  deficiency    Social History   Socioeconomic History   Marital status: Married    Spouse name: Ed   Number of children: 2   Years of education: 16   Highest education level: Bachelor's degree (e.g., BA, AB, BS)  Occupational History   Occupation: Retired    Comment: Software engineer  Tobacco Use   Smoking status: Never   Smokeless tobacco: Never  Vaping Use   Vaping status: Never Used  Substance and Sexual Activity   Alcohol use: Yes    Alcohol/week: 7.0 standard drinks of alcohol    Types: 7 Standard drinks or equivalent per week    Comment: wine nightly   Drug use: No   Sexual activity: Yes    Birth control/protection: Post-menopausal  Other Topics Concern   Not on  file  Social History Narrative   Lives with Ed   2 cats    2 children = neither local    Social Drivers of Health   Tobacco Use: Low Risk (12/08/2023)   Patient History    Smoking Tobacco Use: Never    Smokeless Tobacco Use: Never    Passive Exposure: Not on file  Financial Resource Strain: Low Risk (05/01/2024)   Overall Financial Resource Strain (CARDIA)    Difficulty of Paying Living Expenses: Not hard at all  Food Insecurity: No Food Insecurity (05/01/2024)   Epic    Worried About Brewing Technologist in the Last Year: Never true    Ran Out of Food in the Last Year: Never true  Transportation Needs: No Transportation Needs (05/01/2024)   Epic    Lack of Transportation (Medical): No    Lack of Transportation (Non-Medical): No  Physical Activity: Inactive (05/01/2024)   Exercise Vital Sign    Days of Exercise per Week: 0 days    Minutes of Exercise per Session: Not on file  Stress: No Stress Concern Present (05/01/2024)   [employer] of Occupational Health - Occupational Stress Questionnaire    Feeling of Stress: Only a little  Social Connections: Socially Integrated (05/01/2024)   Social Connection and Isolation Panel    Frequency of Communication with Friends and Family: Once a week    Frequency of Social Gatherings with Friends and Family: Three times a week    Attends Religious Services: More than 4 times per year    Active Member of Clubs or Organizations: Yes    Attends Banker Meetings: More than 4 times per year    Marital Status: Married  Catering Manager Violence: Not At Risk (12/08/2023)   Epic    Fear of Current or Ex-Partner: No    Emotionally Abused: No    Physically Abused: No    Sexually Abused: No  Depression (PHQ2-9): Low Risk (12/08/2023)   Depression (PHQ2-9)    PHQ-2 Score: 0  Alcohol Screen: Low Risk (12/08/2023)   Alcohol Screen    Last Alcohol Screening Score (AUDIT): 0  Housing: Low Risk (05/01/2024)   Epic    Unable to Pay for Housing in the Last Year: No    Number of Times Moved in the Last Year: 0    Homeless in the Last Year: No  Utilities: Not At Risk (12/08/2023)   Epic    Threatened with loss of utilities: No  Health Literacy: Adequate Health Literacy (12/08/2023)   B1300 Health Literacy    Frequency of need for help with medical instructions: Never   Past Surgical History:  Procedure Laterality Date   BREAST BIOPSY Left 03/13/2019   BREAST LUMPECTOMY Left 04/03/2019   BREAST LUMPECTOMY WITH RADIOACTIVE SEED AND  SENTINEL LYMPH NODE BIOPSY Left 04/03/2019   Procedure: LEFT BREAST LUMPECTOMY WITH RADIOACTIVE SEED AND SENTINEL LYMPH NODE MAPPING;  Surgeon: Vanderbilt Ned, MD;  Location: [facility];  Service: General;  Laterality: Left;   CESAREAN SECTION     EYE SURGERY Right    cataract   EYE SURGERY Left    cataract   TONSILLECTOMY     Family History  Problem Relation Age of Onset   Heart attack Father    Congestive Heart Failure Father    Depression Father    Heart disease Father    Osteoporosis Maternal Aunt    Uterine cancer Maternal Aunt    Cancer Mother  possible lung - spread    Early death Mother    Arthritis Mother    Stroke Maternal Grandmother    Stroke Maternal Grandfather    Breast cancer Paternal Grandmother    Arthritis Paternal Grandmother    Stroke Paternal Grandmother    Stroke Paternal Grandfather    Current Medications[1]  Allergies[2]   ROS: Review of Systems Pertinent items noted in HPI and remainder of comprehensive ROS otherwise negative.    Physical exam BP 135/85   Pulse 62   Temp 98.4 F (36.9 C)   Ht 5' 5 (1.651 m)   Wt 114 lb (51.7 kg)   SpO2 98%   BMI 18.97 kg/m  General appearance: alert, cooperative, appears stated age, and no distress Head: Normocephalic, without obvious abnormality, atraumatic Eyes: negative findings: lids and lashes normal, conjunctivae and sclerae normal, corneas clear, and pupils equal, round, reactive to light and accomodation Ears: normal TM's and external ear canals both ears Nose: Nares normal. Septum midline. Mucosa normal. No drainage or sinus tenderness. Throat: lips, mucosa, and tongue normal; teeth and gums normal Neck: no adenopathy, no carotid bruit, supple, symmetrical, trachea midline, and thyroid  not enlarged, symmetric, no tenderness/mass/nodules Back: symmetric, no curvature. ROM normal. No CVA tenderness.  She has some osteoarthritic changes to the hands and wrists bilaterally but  nothing that is erythematous, warm or swollen Lungs: clear to auscultation bilaterally Heart: regular rate and rhythm, S1, S2 normal, no murmur, click, rub or gallop Abdomen: soft, non-tender; bowel sounds normal; no masses,  no organomegaly Extremities: extremities normal, atraumatic, no cyanosis or edema Pulses: 2+ and symmetric Skin: Small cystic lesion that is draining slightly near the left inner corner of the eye along the nasal bridge. Lymph nodes: No supraclavicular or anterior cervical lymph node enlargement Neurologic: Alert and oriented X 3, normal strength and tone. Normal symmetric reflexes. Normal coordination and gait      12/08/2023   11:37 AM 08/25/2023   11:47 AM 02/25/2023    1:59 PM  Depression screen PHQ 2/9  Decreased Interest 0 0 0  Down, Depressed, Hopeless 0 0 0  PHQ - 2 Score 0 0 0  Altered sleeping  0 0  Tired, decreased energy  1 0  Change in appetite  0 0  Feeling bad or failure about yourself   0 0  Trouble concentrating  0 0  Moving slowly or fidgety/restless  0 0  Suicidal thoughts  0 0  PHQ-9 Score  1  0   Difficult doing work/chores   Not difficult at all     Data saved with a previous flowsheet row definition      08/25/2023   11:47 AM 02/25/2023    1:59 PM 08/02/2022    1:10 PM 02/19/2022   11:06 AM  GAD 7 : Generalized Anxiety Score  Nervous, Anxious, on Edge 1 0 0 0  Control/stop worrying 1 0 0 0  Worry too much - different things 0 0 0 0  Trouble relaxing 0 0 0 0  Restless 0 0 0 0  Easily annoyed or irritable 1 0 0 0  Afraid - awful might happen 0 0 0 0  Total GAD 7 Score 3 0 0 0  Anxiety Difficulty Not difficult at all Not difficult at all Not difficult at all Not difficult at all    No results found for this or any previous visit (from the past 2160 hours).   Assessment/ Plan: Madeline VEAR Lush here for annual  physical exam.   Annual physical exam  Age-related osteoporosis without current pathological fracture - Plan:  CMP14+EGFR, VITAMIN D  25 Hydroxy (Vit-D Deficiency, Fractures), denosumab -bbdz (JUBBONTI ) injection 60 mg  Vitamin D  deficiency - Plan: CMP14+EGFR, VITAMIN D  25 Hydroxy (Vit-D Deficiency, Fractures)  Hypothyroidism due to acquired atrophy of thyroid  - Plan: CMP14+EGFR, TSH + free T4, levothyroxine  (SYNTHROID ) 25 MCG tablet  Irritable bowel syndrome with both constipation and diarrhea - Plan: CMP14+EGFR, CBC with Differential  Mixed hyperlipidemia - Plan: Lipid Panel, CMP14+EGFR, rosuvastatin  (CRESTOR ) 5 MG tablet  Generalized anxiety disorder - Plan: busPIRone  (BUSPAR ) 5 MG tablet, escitalopram  (LEXAPRO ) 10 MG tablet  Non-seasonal allergic rhinitis, unspecified trigger - Plan: desloratadine  (CLARINEX ) 5 MG tablet  Blepharitis of left upper eyelid, unspecified type - Plan: erythromycin  ophthalmic ointment   Check fasting labs.  Prolia  administered today for osteoporosis.  Clinically euthyroid.  Check thyroid  levels  Continues to have constipation and diarrhea.  I reiterated how to mix the MiraLAX appropriately for optimal results.  Anxiety disorder is chronic and stable and medication has been renewed for the patient  Clarinex  renewed.  Reviewed how to appropriately utilize Flonase  today.  No evidence of bacterial infection but she will contact me if any signs or symptoms arise  I am going to place her on erythromycin  topically to the affected area near the upper eyelid.  Counseled on healthy lifestyle choices, including diet (rich in fruits, vegetables and lean meats and low in salt and simple carbohydrates) and exercise (at least 30 minutes of moderate physical activity daily).  Patient to follow up 1 year for cpe  Norvil Martensen M. Arionna Hoggard, DO        [1]  Current Outpatient Medications:    Black Pepper-Turmeric (TURMERIC COMPLEX/BLACK PEPPER PO), Take by mouth., Disp: , Rfl:    busPIRone  (BUSPAR ) 5 MG tablet, Take 1 tablet (5 mg total) by mouth 2 (two) times daily. For  anxiety/panic, Disp: 180 tablet, Rfl: 0   Cholecalciferol (VITAMIN D3) 1000 UNITS CAPS, Take 2 capsules by mouth daily., Disp: , Rfl:    cycloSPORINE (RESTASIS) 0.05 % ophthalmic emulsion, Place 1 drop into both eyes 2 (two) times daily., Disp: , Rfl:    denosumab -bbdz (JUBBONTI ) 60 MG/ML SOSY injection, Inject 60 mg into the skin every 6 (six) months., Disp: 1 mL, Rfl: 0   desloratadine  (CLARINEX ) 5 MG tablet, Take 1 tablet (5 mg total) by mouth daily as needed (allergies/ itching)., Disp: 90 tablet, Rfl: 3   escitalopram  (LEXAPRO ) 10 MG tablet, TAKE ONE TABLET ONCE DAILY, Disp: 90 tablet, Rfl: 0   estradiol  (ESTRACE ) 0.1 MG/GM vaginal cream, INSERT VAGINALLY AS DIRECTED AT BEDTIME, Disp: 127.5 g, Rfl: 1   fluticasone  (FLONASE ) 50 MCG/ACT nasal spray, 1 SPRAY IN EACH NOSTRIL EVERY 12 HOURS, Disp: 48 g, Rfl: 1   levothyroxine  (SYNTHROID ) 25 MCG tablet, TAKE 1 TABLET DAILY BEFORE BREAKFAST, Disp: 90 tablet, Rfl: 1   Multiple Vitamins-Minerals (WOMENS 50+ MULTI VITAMIN/MIN PO), Take by mouth., Disp: , Rfl:    Omega-3 Fatty Acids (SUPER OMEGA-3 PO), Take by mouth., Disp: , Rfl:    Polyethylene Glycol 3350 (MIRALAX PO), Take 1 packet by mouth daily as needed., Disp: , Rfl:    rosuvastatin  (CRESTOR ) 5 MG tablet, TAKE ONE TABLET DAILY AS DIRECTED, Disp: 90 tablet, Rfl: 0   Specialty Vitamins Products (ONE-A-DAY BONE STRENGTH) 500-28-100 MG-MG-UNIT TABS, Take 1 tablet by mouth 3 (three) times daily., Disp: , Rfl:    tamoxifen  (NOLVADEX ) 20 MG tablet, Take 1 tablet (20  mg total) by mouth daily., Disp: 90 tablet, Rfl: 4 [2]  Allergies Allergen Reactions   Macrodantin    Sulfa Antibiotics    Wellbutrin [Bupropion Hcl]

## 2024-05-02 NOTE — Telephone Encounter (Signed)
 Patient confirmed that Kayla Randolph  has already been picked up from [facility]. She told me she will bring the medication with her to her appointment. Thanks!

## 2024-05-02 NOTE — Progress Notes (Signed)
 Patient has already picked up medication from [facility] and has stated she will bring medication to office for her appointment. Sent clinic message to inform

## 2024-05-02 NOTE — Patient Instructions (Addendum)
 Ok to take Tylenol  arthritis (650mg ) daily for arthritis.  This is a safe daily dose. Remember to mix 1/2 capful of miralax with 4 ounces of water and drink daily as needed for constipation. Eye ointment sent for your eyelid. Remember to use the flonase  directed slightly outwards towards your nostril so the medication stays in the nose.  Preventive Care 30 Years and Older, Female Preventive care refers to lifestyle choices and visits with your health care provider that can promote health and wellness. Preventive care visits are also called wellness exams. What can I expect for my preventive care visit? Counseling Your health care provider may ask you questions about your: Medical history, including: Past medical problems. Family medical history. Pregnancy and menstrual history. History of falls. Current health, including: Memory and ability to understand (cognition). Emotional well-being. Home life and relationship well-being. Sexual activity and sexual health. Lifestyle, including: Alcohol, nicotine or tobacco, and drug use. Access to firearms. Diet, exercise, and sleep habits. Work and work astronomer. Sunscreen use. Safety issues such as seatbelt and bike helmet use. Physical exam Your health care provider will check your: Height and weight. These may be used to calculate your BMI (body mass index). BMI is a measurement that tells if you are at a healthy weight. Waist circumference. This measures the distance around your waistline. This measurement also tells if you are at a healthy weight and may help predict your risk of certain diseases, such as type 2 diabetes and high blood pressure. Heart rate and blood pressure. Body temperature. Skin for abnormal spots. What immunizations do I need?  Vaccines are usually given at various ages, according to a schedule. Your health care provider will recommend vaccines for you based on your age, medical history, and lifestyle or other  factors, such as travel or where you work. What tests do I need? Screening Your health care provider may recommend screening tests for certain conditions. This may include: Lipid and cholesterol levels. Hepatitis C test. Hepatitis B test. HIV (human immunodeficiency virus) test. STI (sexually transmitted infection) testing, if you are at risk. Lung cancer screening. Colorectal cancer screening. Diabetes screening. This is done by checking your blood sugar (glucose) after you have not eaten for a while (fasting). Mammogram. Talk with your health care provider about how often you should have regular mammograms. BRCA-related cancer screening. This may be done if you have a family history of breast, ovarian, tubal, or peritoneal cancers. Bone density scan. This is done to screen for osteoporosis. Talk with your health care provider about your test results, treatment options, and if necessary, the need for more tests. Follow these instructions at home: Eating and drinking  Eat a diet that includes fresh fruits and vegetables, whole grains, lean protein, and low-fat dairy products. Limit your intake of foods with high amounts of sugar, saturated fats, and salt. Take vitamin and mineral supplements as recommended by your health care provider. Do not drink alcohol if your health care provider tells you not to drink. If you drink alcohol: Limit how much you have to 0-1 drink a day. Know how much alcohol is in your drink. In the [place] one drink equals one 12 oz bottle of beer (355 mL), one 5 oz glass of wine (148 mL), or one 1 oz glass of hard liquor (44 mL). Lifestyle Brush your teeth every morning and night with fluoride toothpaste. Floss one time each day. Exercise for at least 30 minutes 5 or more days each week. Do not use  any products that contain nicotine or tobacco. These products include cigarettes, chewing tobacco, and vaping devices, such as e-cigarettes. If you need help quitting, ask  your health care provider. Do not use drugs. If you are sexually active, practice safe sex. Use a condom or other form of protection in order to prevent STIs. Take aspirin only as told by your health care provider. Make sure that you understand how much to take and what form to take. Work with your health care provider to find out whether it is safe and beneficial for you to take aspirin daily. Ask your health care provider if you need to take a cholesterol-lowering medicine (statin). Find healthy ways to manage stress, such as: Meditation, yoga, or listening to music. Journaling. Talking to a trusted person. Spending time with friends and family. Minimize exposure to UV radiation to reduce your risk of skin cancer. Safety Always wear your seat belt while driving or riding in a vehicle. Do not drive: If you have been drinking alcohol. Do not ride with someone who has been drinking. When you are tired or distracted. While texting. If you have been using any mind-altering substances or drugs. Wear a helmet and other protective equipment during sports activities. If you have firearms in your house, make sure you follow all gun safety procedures. What's next? Visit your health care provider once a year for an annual wellness visit. Ask your health care provider how often you should have your eyes and teeth checked. Stay up to date on all vaccines. This information is not intended to replace advice given to you by your health care provider. Make sure you discuss any questions you have with your health care provider. Document Revised: 10/29/2020 Document Reviewed: 10/29/2020 Elsevier Patient Education  2024 [employer].

## 2024-05-03 ENCOUNTER — Ambulatory Visit

## 2024-05-03 LAB — CMP14+EGFR
ALT: 11 IU/L (ref 0–32)
AST: 22 IU/L (ref 0–40)
Albumin: 3.9 g/dL (ref 3.8–4.8)
Alkaline Phosphatase: 36 IU/L — ABNORMAL LOW (ref 49–135)
BUN/Creatinine Ratio: 18 (ref 12–28)
BUN: 14 mg/dL (ref 8–27)
Bilirubin Total: 0.3 mg/dL (ref 0.0–1.2)
CO2: 26 mmol/L (ref 20–29)
Calcium: 9.3 mg/dL (ref 8.7–10.3)
Chloride: 104 mmol/L (ref 96–106)
Creatinine, Ser: 0.76 mg/dL (ref 0.57–1.00)
Globulin, Total: 2.1 g/dL (ref 1.5–4.5)
Glucose: 79 mg/dL (ref 70–99)
Potassium: 4 mmol/L (ref 3.5–5.2)
Sodium: 143 mmol/L (ref 134–144)
Total Protein: 6 g/dL (ref 6.0–8.5)
eGFR: 80 mL/min/1.73 (ref >59)

## 2024-05-03 LAB — LIPID PANEL
Chol/HDL Ratio: 1.9 ratio (ref 0.0–4.4)
Cholesterol, Total: 154 mg/dL (ref 100–199)
HDL: 82 mg/dL (ref >39)
LDL Chol Calc (NIH): 55 mg/dL (ref 0–99)
Triglycerides: 91 mg/dL (ref 0–149)
VLDL Cholesterol Cal: 17 mg/dL (ref 5–40)

## 2024-05-03 LAB — CBC WITH DIFFERENTIAL/PLATELET
Basophils Absolute: 0 x10E3/uL (ref 0.0–0.2)
Basos: 1 %
EOS (ABSOLUTE): 0.2 x10E3/uL (ref 0.0–0.4)
Eos: 2 %
Hematocrit: 43 % (ref 34.0–46.6)
Hemoglobin: 13.5 g/dL (ref 11.1–15.9)
Immature Grans (Abs): 0 x10E3/uL (ref 0.0–0.1)
Immature Granulocytes: 0 %
Lymphocytes Absolute: 1.2 x10E3/uL (ref 0.7–3.1)
Lymphs: 18 %
MCH: 28.7 pg (ref 26.6–33.0)
MCHC: 31.4 g/dL — ABNORMAL LOW (ref 31.5–35.7)
MCV: 92 fL (ref 79–97)
Monocytes Absolute: 0.6 x10E3/uL (ref 0.1–0.9)
Monocytes: 10 %
Neutrophils Absolute: 4.6 x10E3/uL (ref 1.4–7.0)
Neutrophils: 68 %
Platelets: 187 x10E3/uL (ref 150–450)
RBC: 4.7 x10E6/uL (ref 3.77–5.28)
RDW: 16.1 % — ABNORMAL HIGH (ref 11.7–15.4)
WBC: 6.6 x10E3/uL (ref 3.4–10.8)

## 2024-05-03 LAB — TSH+FREE T4
Free T4: 0.98 ng/dL (ref 0.82–1.77)
TSH: 2.16 u[IU]/mL (ref 0.450–4.500)

## 2024-05-03 LAB — VITAMIN D 25 HYDROXY (VIT D DEFICIENCY, FRACTURES): Vit D, 25-Hydroxy: 52.5 ng/mL (ref 30.0–100.0)

## 2024-05-03 NOTE — Telephone Encounter (Signed)
 Noted

## 2024-05-04 ENCOUNTER — Ambulatory Visit: Payer: Self-pay | Admitting: Family Medicine

## 2024-05-16 ENCOUNTER — Other Ambulatory Visit: Payer: Self-pay | Admitting: Family Medicine

## 2024-06-04 ENCOUNTER — Telehealth: Payer: Self-pay | Admitting: Family Medicine

## 2024-06-04 DIAGNOSIS — Z1283 Encounter for screening for malignant neoplasm of skin: Secondary | ICD-10-CM

## 2024-06-04 NOTE — Telephone Encounter (Signed)
 Copied from CRM #[id number]. Topic: Referral - Request for Referral >> Jun 04, 2024 11:13 AM Ole G wrote: Did the patient discuss referral with their provider in the last year? No (If No - schedule appointment) (If Yes - send message)  Appointment offered? Yes  ------ Patient adv has been going to this place but now the insurance is requesting referral to be covered and her appt is tomorrow - she goes once a year..   Type of order/referral and detailed reason for visit: dermatologist.. full skin checkup  Preference of office, provider, location: Dr Norleen Hurst  Dermatologist in [place], [place] [address] [place], [place], [place] [phone]   If referral order, have you been seen by this specialty before? Yes (If Yes, this issue or another issue? When? Where? Already a patient goes once a year  Can we respond through MyChart? Yes

## 2024-06-04 NOTE — Telephone Encounter (Signed)
Referral placed and pt is aware. 

## 2024-06-07 ENCOUNTER — Ambulatory Visit
Admission: RE | Admit: 2024-06-07 | Discharge: 2024-06-07 | Disposition: A | Discharge status: Home / Self Care | Source: Ambulatory Visit | Attending: Hematology and Oncology | Admitting: Hematology and Oncology

## 2024-06-07 DIAGNOSIS — C50212 Malignant neoplasm of upper-inner quadrant of left female breast: Secondary | ICD-10-CM

## 2024-10-15 ENCOUNTER — Other Ambulatory Visit

## 2024-10-15 ENCOUNTER — Ambulatory Visit: Admitting: Hematology and Oncology

## 2024-10-16 ENCOUNTER — Inpatient Hospital Stay

## 2024-10-16 ENCOUNTER — Inpatient Hospital Stay: Admitting: Hematology and Oncology

## 2024-12-10 ENCOUNTER — Ambulatory Visit: Payer: Self-pay
